# Patient Record
Sex: Female | Born: 1950
Health system: Southern US, Community
[De-identification: ages and names within clinical notes are randomized; demographics above are authoritative.]

## PROBLEM LIST (undated history)

## (undated) DIAGNOSIS — M1990 Unspecified osteoarthritis, unspecified site: Secondary | ICD-10-CM

## (undated) DIAGNOSIS — K829 Disease of gallbladder, unspecified: Secondary | ICD-10-CM

## (undated) DIAGNOSIS — M2550 Pain in unspecified joint: Secondary | ICD-10-CM

## (undated) DIAGNOSIS — D649 Anemia, unspecified: Secondary | ICD-10-CM

## (undated) DIAGNOSIS — I252 Old myocardial infarction: Secondary | ICD-10-CM

## (undated) DIAGNOSIS — T7840XA Allergy, unspecified, initial encounter: Secondary | ICD-10-CM

## (undated) DIAGNOSIS — G4730 Sleep apnea, unspecified: Secondary | ICD-10-CM

## (undated) DIAGNOSIS — K589 Irritable bowel syndrome without diarrhea: Secondary | ICD-10-CM

## (undated) DIAGNOSIS — Z91018 Allergy to other foods: Secondary | ICD-10-CM

## (undated) DIAGNOSIS — J45909 Unspecified asthma, uncomplicated: Secondary | ICD-10-CM

## (undated) DIAGNOSIS — K219 Gastro-esophageal reflux disease without esophagitis: Secondary | ICD-10-CM

## (undated) DIAGNOSIS — K5792 Diverticulitis of intestine, part unspecified, without perforation or abscess without bleeding: Secondary | ICD-10-CM

## (undated) DIAGNOSIS — I2510 Atherosclerotic heart disease of native coronary artery without angina pectoris: Secondary | ICD-10-CM

## (undated) DIAGNOSIS — E739 Lactose intolerance, unspecified: Secondary | ICD-10-CM

## (undated) HISTORY — DX: Allergy to other foods: Z91.018

## (undated) HISTORY — DX: Unspecified asthma, uncomplicated: J45.909

## (undated) HISTORY — PX: CARDIAC CATHETERIZATION: SHX172

## (undated) HISTORY — DX: Disease of gallbladder, unspecified: K82.9

## (undated) HISTORY — DX: Gastro-esophageal reflux disease without esophagitis: K21.9

## (undated) HISTORY — PX: APPENDECTOMY: SHX54

## (undated) HISTORY — DX: Allergy, unspecified, initial encounter: T78.40XA

## (undated) HISTORY — DX: Old myocardial infarction: I25.2

## (undated) HISTORY — PX: COLOSTOMY REVERSAL: SHX5782

## (undated) HISTORY — PX: BREAST CYST EXCISION: SHX579

## (undated) HISTORY — DX: Irritable bowel syndrome, unspecified: K58.9

## (undated) HISTORY — PX: TONSILLECTOMY: SUR1361

## (undated) HISTORY — DX: Diverticulitis of intestine, part unspecified, without perforation or abscess without bleeding: K57.92

## (undated) HISTORY — DX: Pain in unspecified joint: M25.50

## (undated) HISTORY — DX: Lactose intolerance, unspecified: E73.9

## (undated) HISTORY — PX: COLONOSCOPY: SHX174

## (undated) HISTORY — PX: BREAST BIOPSY: SHX20

---

## 2014-01-31 ENCOUNTER — Other Ambulatory Visit (HOSPITAL_COMMUNITY)
Admission: RE | Admit: 2014-01-31 | Discharge: 2014-01-31 | Disposition: A | Discharge status: Home / Self Care | Payer: Federal, State, Local not specified - PPO | Source: Ambulatory Visit | Attending: Family Medicine | Admitting: Family Medicine

## 2014-01-31 DIAGNOSIS — Z01419 Encounter for gynecological examination (general) (routine) without abnormal findings: Secondary | ICD-10-CM | POA: Insufficient documentation

## 2014-01-31 DIAGNOSIS — R87810 Cervical high risk human papillomavirus (HPV) DNA test positive: Secondary | ICD-10-CM | POA: Diagnosis present

## 2014-01-31 DIAGNOSIS — Z1151 Encounter for screening for human papillomavirus (HPV): Secondary | ICD-10-CM | POA: Diagnosis present

## 2014-03-02 ENCOUNTER — Other Ambulatory Visit: Payer: Self-pay

## 2014-03-02 DIAGNOSIS — Z1231 Encounter for screening mammogram for malignant neoplasm of breast: Secondary | ICD-10-CM

## 2014-03-28 ENCOUNTER — Ambulatory Visit: Payer: Federal, State, Local not specified - PPO

## 2014-04-03 ENCOUNTER — Ambulatory Visit
Admission: RE | Admit: 2014-04-03 | Discharge: 2014-04-03 | Disposition: A | Discharge status: Home / Self Care | Payer: Federal, State, Local not specified - PPO | Source: Ambulatory Visit

## 2014-04-03 DIAGNOSIS — Z1231 Encounter for screening mammogram for malignant neoplasm of breast: Secondary | ICD-10-CM

## 2015-03-09 ENCOUNTER — Other Ambulatory Visit: Payer: Self-pay

## 2015-03-09 DIAGNOSIS — Z1231 Encounter for screening mammogram for malignant neoplasm of breast: Secondary | ICD-10-CM

## 2015-04-05 ENCOUNTER — Ambulatory Visit
Admission: RE | Admit: 2015-04-05 | Discharge: 2015-04-05 | Disposition: A | Discharge status: Home / Self Care | Payer: Federal, State, Local not specified - PPO | Source: Ambulatory Visit

## 2015-04-05 DIAGNOSIS — Z1231 Encounter for screening mammogram for malignant neoplasm of breast: Secondary | ICD-10-CM

## 2015-07-05 ENCOUNTER — Encounter: Payer: Self-pay | Admitting: Gastroenterology

## 2015-08-23 ENCOUNTER — Other Ambulatory Visit (HOSPITAL_COMMUNITY)
Admission: RE | Admit: 2015-08-23 | Discharge: 2015-08-23 | Disposition: A | Discharge status: Home / Self Care | Payer: Federal, State, Local not specified - PPO | Source: Ambulatory Visit | Attending: Family Medicine | Admitting: Family Medicine

## 2015-08-23 ENCOUNTER — Other Ambulatory Visit: Payer: Self-pay | Admitting: Family Medicine

## 2015-08-23 DIAGNOSIS — Z01419 Encounter for gynecological examination (general) (routine) without abnormal findings: Secondary | ICD-10-CM | POA: Insufficient documentation

## 2015-08-23 DIAGNOSIS — Z1151 Encounter for screening for human papillomavirus (HPV): Secondary | ICD-10-CM | POA: Insufficient documentation

## 2015-08-29 LAB — CYTOLOGY - PAP

## 2015-09-03 ENCOUNTER — Encounter: Payer: Self-pay | Admitting: Gastroenterology

## 2015-09-03 ENCOUNTER — Ambulatory Visit (INDEPENDENT_AMBULATORY_CARE_PROVIDER_SITE_OTHER): Payer: Federal, State, Local not specified - PPO | Admitting: Gastroenterology

## 2015-09-03 VITALS — BP 140/78 | HR 72 | Ht 62.0 in | Wt 233.0 lb

## 2015-09-03 DIAGNOSIS — R933 Abnormal findings on diagnostic imaging of other parts of digestive tract: Secondary | ICD-10-CM

## 2015-09-03 DIAGNOSIS — K5732 Diverticulitis of large intestine without perforation or abscess without bleeding: Secondary | ICD-10-CM

## 2015-09-03 MED ORDER — NA SULFATE-K SULFATE-MG SULF 17.5-3.13-1.6 GM/177ML PO SOLN: 1.0000 | Freq: Once | ORAL | Status: DC

## 2015-09-03 NOTE — Patient Instructions (Signed)
You have been scheduled for a colonoscopy. Please follow written instructions given to you at your visit today.  Please pick up your prep supplies at the pharmacy within the next 1-3 days. If you use inhalers (even only as needed), please bring them with you on the day of your procedure. Your physician has requested that you go to www.startemmi.com and enter the access code given to you at your visit today. This web site gives a general overview about your procedure. However, you should still follow specific instructions given to you by our office regarding your preparation for the procedure.  Normal BMI (Body Mass Index- based on height and weight) is between 19 and 25. Your BMI today is Body mass index is 42.61 kg/(m^2). Marland Kitchen Please consider follow up  regarding your BMI with your Primary Care Provider.  Thank you for choosing me and [facility].  Pricilla Riffle. Dagoberto Ligas., MD., Marval Regal

## 2015-09-03 NOTE — Progress Notes (Signed)
    History of Present Illness: This is a 65 year old self referred by for the evaluation of recurrent diverticulitis. Patient states she was diagnosed with diverticulitis in 2013 and was treated with a course of antibiotics. In March 2017 she had a recurrence of diverticulitis and was treated at [facility] walk-in clinic with 2 antibiotics she believes were Cipro and Flagyl. In early April she had a more severe recurrence of left lower quadrant pain and was treated at [facility] ED. I have records from this visit. White blood cell count elevated at 11.4, glucose elevated at 118, other blood work unremarkable. CT scan showed diverticulitis involving the left colon, fatty liver and renal cysts. She was treated with a course of Cipro and Flagyl and possibly Xifaxan per the medical record. Patient does not recall. Her left lower quadrant pain resolved and she has felt well since then. She has no other GI complaints. She has partial records from a colonoscopy performed at [facility] in [place] on 06/14/2007 that apparently showed diverticulosis. Denies weight loss, abdominal pain, constipation, diarrhea, change in stool caliber, melena, hematochezia, nausea, vomiting, dysphagia, reflux symptoms, chest pain.   Review of Systems: Pertinent positive and negative review of systems were noted in the above HPI section. All other review of systems were otherwise negative.  Current Medications, Allergies, Past Medical History, Past Surgical History, Family History and Social History were reviewed in [employer] record.  Physical Exam: General: Well developed, well nourished, no acute distress Head: Normocephalic and atraumatic Eyes:  sclerae anicteric, EOMI Ears: Normal auditory acuity Mouth: No deformity or lesions Neck: Supple, no masses or thyromegaly Lungs: Clear throughout to auscultation Heart: Regular rate and rhythm; no murmurs, rubs or  bruits Abdomen: Soft, non tender and non distended. No masses, hepatosplenomegaly or hernias noted. Normal Bowel sounds Rectal: Deferred to colonoscopy Musculoskeletal: Symmetrical with no gross deformities  Skin: No lesions on visible extremities Pulses:  Normal pulses noted Extremities: No clubbing, cyanosis, edema or deformities noted Neurological: Alert oriented x 4, grossly nonfocal Cervical Nodes:  No significant cervical adenopathy Inguinal Nodes: No significant inguinal adenopathy Psychological:  Alert and cooperative. Normal mood and affect  Assessment and Recommendations:  1. Recurrent diverticulitis, abnormal CT scan of the colon. Continue a high fiber diet, daily fiber supplements and adequate daily water intake. Schedule colonoscopy. The risks (including bleeding, perforation, infection, missed lesions, medication reactions and possible hospitalization or surgery if complications occur), benefits, and alternatives to colonoscopy with possible biopsy and possible polypectomy were discussed with the patient and they consent to proceed.

## 2015-09-18 ENCOUNTER — Encounter: Payer: Self-pay | Admitting: Gastroenterology

## 2015-09-21 ENCOUNTER — Encounter: Payer: Self-pay | Admitting: Gastroenterology

## 2015-09-21 ENCOUNTER — Ambulatory Visit (AMBULATORY_SURGERY_CENTER): Payer: Federal, State, Local not specified - PPO | Admitting: Gastroenterology

## 2015-09-21 VITALS — BP 108/68 | HR 62 | Temp 98.9°F | Resp 13 | Ht 62.0 in | Wt 233.0 lb

## 2015-09-21 DIAGNOSIS — K5732 Diverticulitis of large intestine without perforation or abscess without bleeding: Secondary | ICD-10-CM | POA: Diagnosis present

## 2015-09-21 DIAGNOSIS — R933 Abnormal findings on diagnostic imaging of other parts of digestive tract: Secondary | ICD-10-CM | POA: Diagnosis not present

## 2015-09-21 MED ORDER — SODIUM CHLORIDE 0.9 % IV SOLN: 500.0000 mL | INTRAVENOUS | Status: DC

## 2015-09-21 NOTE — Op Note (Signed)
[facility] Patient Name: Valerie Fields Procedure Date: 09/21/2015 3:24 PM MRN: [id number] Endoscopist: Ladene Artist , MD Age: 65 Referring MD:  Date of Birth: 1950-09-23 Gender: Female Account #: [id number] Procedure:                Colonoscopy Indications:              Evaluation on imaging study of clinically                            significant abnormality, Abnormal CT of the GI tract Medicines:                Monitored Anesthesia Care Procedure:                Pre-Anesthesia Assessment:                           - Prior to the procedure, a History and Physical                            was performed, and patient medications and                            allergies were reviewed. The patient's tolerance of                            previous anesthesia was also reviewed. The risks                            and benefits of the procedure and the sedation                            options and risks were discussed with the patient.                            All questions were answered, and informed consent                            was obtained. Prior Anticoagulants: The patient has                            taken no previous anticoagulant or antiplatelet                            agents. ASA Grade Assessment: II - A patient with                            mild systemic disease. After reviewing the risks                            and benefits, the patient was deemed in                            satisfactory condition to undergo the procedure.  After obtaining informed consent, the colonoscope                            was passed under direct vision. Throughout the                            procedure, the patient's blood pressure, pulse, and                            oxygen saturations were monitored continuously. The                            Model PCF-H190L [id number]) scope was introduced                            through the  anus and advanced to the the cecum,                            identified by appendiceal orifice and ileocecal                            valve. The colonoscopy was performed without                            difficulty. The patient tolerated the procedure                            well. The quality of the bowel preparation was                            good. The ileocecal valve, appendiceal orifice, and                            rectum were photographed. Scope In: 3:29:07 PM Scope Out: 3:42:00 PM Scope Withdrawal Time: 0 hours 10 minutes 36 seconds  Total Procedure Duration: 0 hours 12 minutes 53 seconds  Findings:                 The digital rectal exam was normal.                           Multiple medium-mouthed diverticula were found in                            the splenic flexure, transverse colon, hepatic                            flexure, ascending colon and cecum. There was no                            evidence of diverticular bleeding.                           Many medium-mouthed diverticula were found in the  sigmoid colon and descending colon. There was                            narrowing of the colon in association with the                            diverticular opening. There was evidence of                            diverticular spasm. There was no evidence of                            diverticular bleeding.                           The exam was otherwise without abnormality on                            direct and retroflexion views. Complications:            No immediate complications. Estimated Blood Loss:     Estimated blood loss: none. Impression:               - Mild diverticulosis at the splenic flexure, in                            the transverse colon, at the hepatic flexure, in                            the ascending colon and in the cecum.                           - Moderate diverticulosis in the sigmoid colon and                             in the descending colon.                           - The examination was otherwise normal on direct                            and retroflexion views.                           - No specimens collected. Recommendation:           - Patient has a contact number available for                            emergencies. The signs and symptoms of potential                            delayed complications were discussed with the                            patient. Return to normal activities tomorrow.  Written discharge instructions were provided to the                            patient.                           - High fiber diet indefinitely.                           - Continue present medications.                           - Repeat colonoscopy in 10 years for screening                            purposes. Ladene Artist, MD 09/21/2015 3:47:56 PM This report has been signed electronically.

## 2015-09-21 NOTE — Patient Instructions (Signed)
YOU HAD AN ENDOSCOPIC PROCEDURE TODAY AT [facility]:   Refer to the procedure report that was given to you for any specific questions about what was found during the examination.  If the procedure report does not answer your questions, please call your gastroenterologist to clarify.  If you requested that your care partner not be given the details of your procedure findings, then the procedure report has been included in a sealed envelope for you to review at your convenience later.  YOU SHOULD EXPECT: Some feelings of bloating in the abdomen. Passage of more gas than usual.  Walking can help get rid of the air that was put into your GI tract during the procedure and reduce the bloating. If you had a lower endoscopy (such as a colonoscopy or flexible sigmoidoscopy) you may notice spotting of blood in your stool or on the toilet paper. If you underwent a bowel prep for your procedure, you may not have a normal bowel movement for a few days.  Please Note:  You might notice some irritation and congestion in your nose or some drainage.  This is from the oxygen used during your procedure.  There is no need for concern and it should clear up in a day or so.  SYMPTOMS TO REPORT IMMEDIATELY:   Following lower endoscopy (colonoscopy or flexible sigmoidoscopy):  Excessive amounts of blood in the stool  Significant tenderness or worsening of abdominal pains  Swelling of the abdomen that is new, acute  Fever of 100F or higher  For urgent or emergent issues, a gastroenterologist can be reached at any hour by calling [phone].   DIET: Your first meal following the procedure should be a small meal and then it is ok to progress to your normal diet. Heavy or fried foods are harder to digest and may make you feel nauseous or bloated.  Likewise, meals heavy in dairy and vegetables can increase bloating.  Drink plenty of fluids but you should avoid alcoholic beverages for 24 hours. Increase  the fiber in your diet, and drink plenty of water.  ACTIVITY:  You should plan to take it easy for the rest of today and you should NOT DRIVE or use heavy machinery until tomorrow (because of the sedation medicines used during the test).    FOLLOW UP: Our staff will call the number listed on your records the next business day following your procedure to check on you and address any questions or concerns that you may have regarding the information given to you following your procedure. If we do not reach you, we will leave a message.  However, if you are feeling well and you are not experiencing any problems, there is no need to return our call.  We will assume that you have returned to your regular daily activities without incident.  If any biopsies were taken you will be contacted by phone or by letter within the next 1-3 weeks.  Please call us at [phone] if you have not heard about the biopsies in 3 weeks.    SIGNATURES/CONFIDENTIALITY: You and/or your care partner have signed paperwork which will be entered into your electronic medical record.  These signatures attest to the fact that that the information above on your After Visit Summary has been reviewed and is understood.  Full responsibility of the confidentiality of this discharge information lies with you and/or your care-partner.  Read all of the handouts given to you by your recovery room nurse.  Thank-you for choosing [place] for your healthcare needs today.

## 2015-09-21 NOTE — Progress Notes (Signed)
Report to PACU, RN, vss, BBS= Clear.  

## 2015-09-24 ENCOUNTER — Telehealth: Payer: Self-pay

## 2015-09-24 NOTE — Telephone Encounter (Signed)
  Follow up Call-  Call back number 09/21/2015  Post procedure Call Back phone  # #[phone] cell  Permission to leave phone message Yes     Patient questions:  Do you have a fever, pain , or abdominal swelling? No. Pain Score  0 *  Have you tolerated food without any problems? Yes.    Have you been able to return to your normal activities? Yes.    Do you have any questions about your discharge instructions: Diet   No. Medications  No. Follow up visit  No.  Do you have questions or concerns about your Care? No.  Actions: * If pain score is 4 or above: No action needed, pain <4.

## 2016-02-28 ENCOUNTER — Other Ambulatory Visit: Payer: Self-pay | Admitting: Family Medicine

## 2016-02-28 DIAGNOSIS — Z1231 Encounter for screening mammogram for malignant neoplasm of breast: Secondary | ICD-10-CM

## 2016-04-08 ENCOUNTER — Ambulatory Visit
Admission: RE | Admit: 2016-04-08 | Discharge: 2016-04-08 | Disposition: A | Discharge status: Home / Self Care | Payer: Federal, State, Local not specified - PPO | Source: Ambulatory Visit | Attending: Family Medicine | Admitting: Family Medicine

## 2016-04-08 DIAGNOSIS — Z1231 Encounter for screening mammogram for malignant neoplasm of breast: Secondary | ICD-10-CM

## 2016-12-08 DIAGNOSIS — M25552 Pain in left hip: Secondary | ICD-10-CM | POA: Diagnosis not present

## 2016-12-15 DIAGNOSIS — R110 Nausea: Secondary | ICD-10-CM | POA: Diagnosis not present

## 2016-12-15 DIAGNOSIS — R1031 Right lower quadrant pain: Secondary | ICD-10-CM | POA: Diagnosis not present

## 2016-12-15 DIAGNOSIS — E278 Other specified disorders of adrenal gland: Secondary | ICD-10-CM | POA: Diagnosis not present

## 2016-12-15 DIAGNOSIS — R509 Fever, unspecified: Secondary | ICD-10-CM | POA: Diagnosis not present

## 2016-12-15 DIAGNOSIS — Z882 Allergy status to sulfonamides status: Secondary | ICD-10-CM | POA: Diagnosis not present

## 2016-12-15 DIAGNOSIS — K5900 Constipation, unspecified: Secondary | ICD-10-CM | POA: Diagnosis not present

## 2016-12-15 DIAGNOSIS — N281 Cyst of kidney, acquired: Secondary | ICD-10-CM | POA: Diagnosis not present

## 2016-12-15 DIAGNOSIS — R1032 Left lower quadrant pain: Secondary | ICD-10-CM | POA: Diagnosis not present

## 2016-12-15 DIAGNOSIS — K5732 Diverticulitis of large intestine without perforation or abscess without bleeding: Secondary | ICD-10-CM | POA: Diagnosis not present

## 2016-12-17 DIAGNOSIS — K5792 Diverticulitis of intestine, part unspecified, without perforation or abscess without bleeding: Secondary | ICD-10-CM | POA: Diagnosis not present

## 2016-12-22 DIAGNOSIS — M25552 Pain in left hip: Secondary | ICD-10-CM | POA: Diagnosis not present

## 2017-01-07 DIAGNOSIS — H1045 Other chronic allergic conjunctivitis: Secondary | ICD-10-CM | POA: Diagnosis not present

## 2017-01-07 DIAGNOSIS — J3089 Other allergic rhinitis: Secondary | ICD-10-CM | POA: Diagnosis not present

## 2017-01-07 DIAGNOSIS — R05 Cough: Secondary | ICD-10-CM | POA: Diagnosis not present

## 2017-01-07 DIAGNOSIS — J3081 Allergic rhinitis due to animal (cat) (dog) hair and dander: Secondary | ICD-10-CM | POA: Diagnosis not present

## 2017-01-21 DIAGNOSIS — Z23 Encounter for immunization: Secondary | ICD-10-CM | POA: Diagnosis not present

## 2017-01-23 DIAGNOSIS — L600 Ingrowing nail: Secondary | ICD-10-CM | POA: Diagnosis not present

## 2017-02-02 ENCOUNTER — Encounter (INDEPENDENT_AMBULATORY_CARE_PROVIDER_SITE_OTHER): Payer: Self-pay

## 2017-02-02 ENCOUNTER — Ambulatory Visit (INDEPENDENT_AMBULATORY_CARE_PROVIDER_SITE_OTHER): Payer: Medicare Other | Admitting: Physician Assistant

## 2017-02-02 ENCOUNTER — Encounter: Payer: Self-pay | Admitting: Physician Assistant

## 2017-02-02 VITALS — BP 124/72 | HR 76 | Ht 62.0 in | Wt 216.0 lb

## 2017-02-02 DIAGNOSIS — K5732 Diverticulitis of large intestine without perforation or abscess without bleeding: Secondary | ICD-10-CM | POA: Diagnosis not present

## 2017-02-02 NOTE — Patient Instructions (Signed)
We have given you a low fiber, low residue handout.   Please purchase the following medications over the counter and take as directed:  Align probiotic once daily

## 2017-02-02 NOTE — Progress Notes (Signed)
Reviewed and agree with initial management plan.  Auriana Scalia T. Skylen Spiering, MD FACG 

## 2017-02-02 NOTE — Progress Notes (Signed)
Chief Complaint: Diverticulitis  HPI: Valerie Fields is a 66 year old female with a past medical history as listed below who follows with Dr. Fuller Plan and who was referred to me by Marda Stalker, PA-C for follow-up after an episode of diverticulitis.      Patient's last colonoscopy was performed 09/21/15 with findings of mild diverticulosis at the splenic flexure, transverse colon, hepatic flexure and ascending colon as well as cecum, moderate diverticulosis in the sigmoid colon and descending colon and otherwise normal exam.    Patient was seen in the ER 12/15/16 with a complaint of left lower quadrant abdominal pain and a history of diverticulitis x2.  She also had a low-grade fever of 100.6.  CT of the abdomen and pelvis showed sigmoid diverticulitis with surrounding stranding and mild fecal retention throughout the colon. She also had an elevated white count.  She was given Ciprofloxacin and Flagyl.    Today, the patient presents to clinic and tells me that she feels completely better.  She explains that she was slightly disappointed when she had another attack of diverticulitis.  She has had 3 in the past 6 years.  She explains that she has changed her diet to a more healthy one on Weight Watchers and is actually losing weight and has been eating high fiber.  She describes regular bowel movements and no constipation.  She also tries to increase her water intake.  She asked today if there is anything she can do in the future to try and prevent these episodes.  Patient does tell me when she feels some left lower quadrant abdominal pain she will take Ciprofloxacin which she has left over at home and after 3-4 days feels better.    Patient denies fever, chills, blood in her stool, melena, weight loss, fatigue, anorexia, nausea, vomiting, change in bowel habits or current abdominal pain.   Past Medical History:  Diagnosis Date  . Allergy   . Asthma   . Diverticulitis   . GERD (gastroesophageal reflux  disease)     Past Surgical History:  Procedure Laterality Date  . BREAST BIOPSY Left   . COLONOSCOPY    . TONSILLECTOMY      Current Outpatient Medications  Medication Sig Dispense Refill  . azelastine (ASTELIN) 0.1 % nasal spray Place 1 spray 2 (two) times daily into both nostrils. Use in each nostril as directed    . cholecalciferol (VITAMIN D) 1000 units tablet Take 2,000 Units daily by mouth.    . fluticasone (VERAMYST) 27.5 MCG/SPRAY nasal spray Place 2 sprays into the nose daily.    . meloxicam (MOBIC) 15 MG tablet TAKE 1 TABLET BY MOUTH EVERY DAY WITH FOOD AS NEEDED    . montelukast (SINGULAIR) 10 MG tablet Take 10 mg by mouth daily.  3  . Omega-3 Fatty Acids (FISH OIL) 1200 MG CAPS Take 1 capsule by mouth daily.    . Red Yeast Rice Extract 600 MG CAPS Take 2 capsules by mouth daily.     No current facility-administered medications for this visit.     Allergies as of 02/02/2017 - Review Complete 02/02/2017  Allergen Reaction Noted  . Sulfa antibiotics Other (See Comments) 09/03/2015    Family History  Problem Relation Age of Onset  . Irritable bowel syndrome Father   . Aneurysm Mother   . Stomach cancer Maternal Grandfather   . Colon cancer Neg Hx   . Esophageal cancer Neg Hx   . Pancreatic cancer Neg Hx   . Rectal  cancer Neg Hx     Social History   Socioeconomic History  . Marital status: Divorced    Spouse name: Not on file  . Number of children: 2  . Years of education: Not on file  . Highest education level: Not on file  Social Needs  . Financial resource strain: Not on file  . Food insecurity - worry: Not on file  . Food insecurity - inability: Not on file  . Transportation needs - medical: Not on file  . Transportation needs - non-medical: Not on file  Occupational History  . Occupation: Retired  Tobacco Use  . Smoking status: Never Smoker  . Smokeless tobacco: Never Used  Substance and Sexual Activity  . Alcohol use: Yes    Alcohol/week: 0.6  oz    Types: 1 Glasses of wine per week  . Drug use: No  . Sexual activity: Not on file  Other Topics Concern  . Not on file  Social History Narrative  . Not on file    Review of Systems:    Constitutional: No weight loss, fever or chills Skin: No rash  Cardiovascular: No chest pain   Respiratory: No SOB  Gastrointestinal: See HPI and otherwise negative Genitourinary: No dysuria  Neurological: No headache Musculoskeletal: No new muscle or joint pain Hematologic: No bleeding  Psychiatric: No history of depression or anxiety   Physical Exam:  Vital signs: BP 124/72   Pulse 76   Ht 5\' 2"  (1.575 m)   Wt 216 lb (98 kg)   BMI 39.51 kg/m    Constitutional:   Pleasant overweight Caucasian female appears to be in NAD, Well developed, Well nourished, alert and cooperative Respiratory: Respirations even and unlabored. Lungs clear to auscultation bilaterally.   No wheezes, crackles, or rhonchi.  Cardiovascular: Normal S1, S2. No MRG. Regular rate and rhythm. No peripheral edema, cyanosis or pallor.  Gastrointestinal:  Soft, nondistended, nontender. No rebound or guarding. Normal bowel sounds. No appreciable masses or hepatomegaly. Psychiatric:  Demonstrates good judgement and reason without abnormal affect or behaviors.  See HPI for recent imaging.  Assessment: 1. Diverticulitis: dx 12/15/16 with diverticulitis via CT and elevated white count in ER, 7 days of Cipro and Flagyl cleared her symptoms, no further abdominal pain since, last colonoscopy 2017 with moderate diverticulosis in the sigmoid colon  Plan: 1.  Reviewed the pathophysiology of diverticulitis with the patient today using diagrams in the room.  Did discuss that if the patient feels a left lower quadrant abdominal pain starting she can go on a low fiber /low residue diet for 3-4 days (provided her with handout) or until pain resolves and this may prevent an episode.  I prefer she do this rather than use 2-3 days of  Ciprofloxacin as she could build up a bacterial resistance in the future.  Patient verbalized understanding. 2.  Reviewed recommendations for a high fiber diet 25-35 g/day through use of a fiber supplement and/or through her diet on a daily basis when she is not having a flare. 3.  Discussed probiotics with the patient.  Encouraged her to use Align for at least 2 months.  Did provide a coupon. 4.  Patient to follow in clinic as needed in the future.  Ellouise Newer, PA-C [facility] 02/02/2017, 2:34 PM  Cc: Marda Stalker, PA-C

## 2017-03-03 DIAGNOSIS — M7062 Trochanteric bursitis, left hip: Secondary | ICD-10-CM | POA: Diagnosis not present

## 2017-03-29 DIAGNOSIS — J0101 Acute recurrent maxillary sinusitis: Secondary | ICD-10-CM | POA: Diagnosis not present

## 2017-04-06 ENCOUNTER — Other Ambulatory Visit: Payer: Self-pay | Admitting: Family Medicine

## 2017-04-06 DIAGNOSIS — Z1231 Encounter for screening mammogram for malignant neoplasm of breast: Secondary | ICD-10-CM

## 2017-04-21 DIAGNOSIS — M791 Myalgia, unspecified site: Secondary | ICD-10-CM | POA: Diagnosis not present

## 2017-04-21 DIAGNOSIS — J00 Acute nasopharyngitis [common cold]: Secondary | ICD-10-CM | POA: Diagnosis not present

## 2017-04-29 ENCOUNTER — Ambulatory Visit: Payer: Medicare Other

## 2017-04-30 ENCOUNTER — Ambulatory Visit
Admission: RE | Admit: 2017-04-30 | Discharge: 2017-04-30 | Disposition: A | Discharge status: Home / Self Care | Payer: Medicare Other | Source: Ambulatory Visit | Attending: Family Medicine | Admitting: Family Medicine

## 2017-04-30 DIAGNOSIS — Z1231 Encounter for screening mammogram for malignant neoplasm of breast: Secondary | ICD-10-CM

## 2017-05-20 DIAGNOSIS — D2272 Melanocytic nevi of left lower limb, including hip: Secondary | ICD-10-CM | POA: Diagnosis not present

## 2017-05-20 DIAGNOSIS — X32XXXA Exposure to sunlight, initial encounter: Secondary | ICD-10-CM | POA: Diagnosis not present

## 2017-05-20 DIAGNOSIS — R21 Rash and other nonspecific skin eruption: Secondary | ICD-10-CM | POA: Diagnosis not present

## 2017-05-20 DIAGNOSIS — L570 Actinic keratosis: Secondary | ICD-10-CM | POA: Diagnosis not present

## 2017-05-20 DIAGNOSIS — R208 Other disturbances of skin sensation: Secondary | ICD-10-CM | POA: Diagnosis not present

## 2017-05-20 DIAGNOSIS — D485 Neoplasm of uncertain behavior of skin: Secondary | ICD-10-CM | POA: Diagnosis not present

## 2017-05-20 DIAGNOSIS — L821 Other seborrheic keratosis: Secondary | ICD-10-CM | POA: Diagnosis not present

## 2017-05-20 DIAGNOSIS — Z789 Other specified health status: Secondary | ICD-10-CM | POA: Diagnosis not present

## 2017-05-20 DIAGNOSIS — D2261 Melanocytic nevi of right upper limb, including shoulder: Secondary | ICD-10-CM | POA: Diagnosis not present

## 2017-05-20 DIAGNOSIS — D2262 Melanocytic nevi of left upper limb, including shoulder: Secondary | ICD-10-CM | POA: Diagnosis not present

## 2017-05-20 DIAGNOSIS — L820 Inflamed seborrheic keratosis: Secondary | ICD-10-CM | POA: Diagnosis not present

## 2017-05-20 DIAGNOSIS — L538 Other specified erythematous conditions: Secondary | ICD-10-CM | POA: Diagnosis not present

## 2017-05-20 DIAGNOSIS — D2271 Melanocytic nevi of right lower limb, including hip: Secondary | ICD-10-CM | POA: Diagnosis not present

## 2017-06-25 DIAGNOSIS — R030 Elevated blood-pressure reading, without diagnosis of hypertension: Secondary | ICD-10-CM | POA: Diagnosis not present

## 2017-06-25 DIAGNOSIS — K5792 Diverticulitis of intestine, part unspecified, without perforation or abscess without bleeding: Secondary | ICD-10-CM | POA: Diagnosis not present

## 2017-07-03 DIAGNOSIS — K5792 Diverticulitis of intestine, part unspecified, without perforation or abscess without bleeding: Secondary | ICD-10-CM | POA: Diagnosis not present

## 2017-07-14 ENCOUNTER — Telehealth: Payer: Self-pay | Admitting: Gastroenterology

## 2017-07-14 NOTE — Telephone Encounter (Signed)
Patient was treated by her primary care for diverticulitis.  She was treated with cipro and flagyl and the pain resolved.  She was feeling better until yesterday.  Yesterday she had some abdominal cramping and some slight nausea.  She wants to know what to do.  She denies pain, vomiting, diarrhea, constipation or fever.  She is advised to try a bland diet for the next 24-48 hours can try IBgard and if her symptoms fail to improve she should call back.

## 2017-07-14 NOTE — Telephone Encounter (Signed)
Agree with recommendations.  

## 2017-09-03 DIAGNOSIS — Z0000 Encounter for general adult medical examination without abnormal findings: Secondary | ICD-10-CM | POA: Diagnosis not present

## 2017-09-03 DIAGNOSIS — R739 Hyperglycemia, unspecified: Secondary | ICD-10-CM | POA: Diagnosis not present

## 2017-09-03 DIAGNOSIS — E7800 Pure hypercholesterolemia, unspecified: Secondary | ICD-10-CM | POA: Diagnosis not present

## 2017-09-09 DIAGNOSIS — R739 Hyperglycemia, unspecified: Secondary | ICD-10-CM | POA: Diagnosis not present

## 2017-09-09 DIAGNOSIS — Z136 Encounter for screening for cardiovascular disorders: Secondary | ICD-10-CM | POA: Diagnosis not present

## 2017-09-09 DIAGNOSIS — E7800 Pure hypercholesterolemia, unspecified: Secondary | ICD-10-CM | POA: Diagnosis not present

## 2017-09-09 DIAGNOSIS — Z0000 Encounter for general adult medical examination without abnormal findings: Secondary | ICD-10-CM | POA: Diagnosis not present

## 2017-10-20 DIAGNOSIS — M84675A Pathological fracture in other disease, left foot, initial encounter for fracture: Secondary | ICD-10-CM | POA: Diagnosis not present

## 2017-10-20 DIAGNOSIS — M722 Plantar fascial fibromatosis: Secondary | ICD-10-CM | POA: Diagnosis not present

## 2017-11-10 DIAGNOSIS — M84374D Stress fracture, right foot, subsequent encounter for fracture with routine healing: Secondary | ICD-10-CM | POA: Diagnosis not present

## 2017-11-17 DIAGNOSIS — E2839 Other primary ovarian failure: Secondary | ICD-10-CM | POA: Diagnosis not present

## 2017-11-17 DIAGNOSIS — M8588 Other specified disorders of bone density and structure, other site: Secondary | ICD-10-CM | POA: Diagnosis not present

## 2017-12-08 DIAGNOSIS — R319 Hematuria, unspecified: Secondary | ICD-10-CM | POA: Diagnosis not present

## 2017-12-13 DIAGNOSIS — N390 Urinary tract infection, site not specified: Secondary | ICD-10-CM | POA: Diagnosis not present

## 2017-12-13 DIAGNOSIS — R319 Hematuria, unspecified: Secondary | ICD-10-CM | POA: Diagnosis not present

## 2017-12-29 DIAGNOSIS — M1711 Unilateral primary osteoarthritis, right knee: Secondary | ICD-10-CM | POA: Diagnosis not present

## 2018-01-22 DIAGNOSIS — J329 Chronic sinusitis, unspecified: Secondary | ICD-10-CM | POA: Diagnosis not present

## 2018-01-31 DIAGNOSIS — Z23 Encounter for immunization: Secondary | ICD-10-CM | POA: Diagnosis not present

## 2018-02-08 DIAGNOSIS — J3081 Allergic rhinitis due to animal (cat) (dog) hair and dander: Secondary | ICD-10-CM | POA: Diagnosis not present

## 2018-02-08 DIAGNOSIS — R05 Cough: Secondary | ICD-10-CM | POA: Diagnosis not present

## 2018-02-08 DIAGNOSIS — H1045 Other chronic allergic conjunctivitis: Secondary | ICD-10-CM | POA: Diagnosis not present

## 2018-02-08 DIAGNOSIS — J3089 Other allergic rhinitis: Secondary | ICD-10-CM | POA: Diagnosis not present

## 2018-02-15 DIAGNOSIS — H2513 Age-related nuclear cataract, bilateral: Secondary | ICD-10-CM | POA: Diagnosis not present

## 2018-04-02 ENCOUNTER — Other Ambulatory Visit: Payer: Self-pay | Admitting: Family Medicine

## 2018-04-02 DIAGNOSIS — Z1231 Encounter for screening mammogram for malignant neoplasm of breast: Secondary | ICD-10-CM

## 2018-04-09 ENCOUNTER — Encounter: Payer: Self-pay | Admitting: Physician Assistant

## 2018-04-09 ENCOUNTER — Ambulatory Visit (INDEPENDENT_AMBULATORY_CARE_PROVIDER_SITE_OTHER): Payer: Medicare HMO | Admitting: Physician Assistant

## 2018-04-09 ENCOUNTER — Encounter (INDEPENDENT_AMBULATORY_CARE_PROVIDER_SITE_OTHER): Payer: Self-pay

## 2018-04-09 VITALS — BP 128/70 | HR 83 | Temp 98.8°F | Ht 62.0 in | Wt 202.0 lb

## 2018-04-09 DIAGNOSIS — Z8719 Personal history of other diseases of the digestive system: Secondary | ICD-10-CM

## 2018-04-09 DIAGNOSIS — R1030 Lower abdominal pain, unspecified: Secondary | ICD-10-CM | POA: Diagnosis not present

## 2018-04-09 MED ORDER — DICYCLOMINE HCL 10 MG PO CAPS: ORAL_CAPSULE | ORAL | 2 refills | Status: DC

## 2018-04-09 MED ORDER — CIPROFLOXACIN HCL 500 MG PO TABS: 500.0000 mg | ORAL_TABLET | Freq: Two times a day (BID) | ORAL | 0 refills | Status: DC

## 2018-04-09 MED ORDER — METRONIDAZOLE 500 MG PO TABS: 500.0000 mg | ORAL_TABLET | Freq: Two times a day (BID) | ORAL | 0 refills | Status: AC

## 2018-04-09 NOTE — Progress Notes (Signed)
Chief Complaint: Lower abdominal pain  HPI:    Valerie Fields is a 68 year old Caucasian female with a past medical history as listed below, known to Dr. Fuller Plan, who presents to clinic today with a complaint of diverticulitis.    02/02/2017 office visit with me, at that time had recently been to the ER with a CT showing sigmoid diverticulitis.  She had been placed on Cipro and Flagyl and felt completely better at her follow-up.  At that time discussed diverticulitis, last colonoscopy noted 2017 with moderate diverticulosis in the sigmoid colon.  At that time discussed diet recommendations to try and prevent further episodes.    Today, the patient presents clinic and explains that she has been doing fairly well maintaining herself in a high-fiber diet with a fiber supplement as well as a stool softener daily which keeps her regular.  Over Christmas she went to [place] and tells me that she dank more alcohol than she normally would and ate some different things and a couple days into that trip started with lower abdominal cramping and felt like every time she ate she needed to have a bowel movement about 30 to 40 minutes later which would be solid.  She started drinking more water and watching her diet and things seemed to get better and were completely gone by 04/01/2018, but over the past couple of days she has started again with a lower abdominal pain which occurs all day long rated as a 2-3/10 and gets worse after eating about 10-15 minutes and will result in a bowel movement.  Also describes staying in bed yesterday and feeling "chilled" with a low-grade temp at 100.5.    Denies weight loss, blood in her stool or symptoms that awaken her from sleep.  Past Medical History:  Diagnosis Date  . Allergy   . Asthma   . Diverticulitis   . GERD (gastroesophageal reflux disease)     Past Surgical History:  Procedure Laterality Date  . BREAST BIOPSY Left    No Scar seen   . BREAST CYST EXCISION Left    No scar seen   . COLONOSCOPY    . TONSILLECTOMY      Current Outpatient Medications  Medication Sig Dispense Refill  . azelastine (ASTELIN) 0.1 % nasal spray Place 1 spray 2 (two) times daily into both nostrils. Use in each nostril as directed    . cholecalciferol (VITAMIN D) 1000 units tablet Take 2,000 Units daily by mouth.    . fluticasone (VERAMYST) 27.5 MCG/SPRAY nasal spray Place 2 sprays into the nose daily.    . meloxicam (MOBIC) 15 MG tablet TAKE 1 TABLET BY MOUTH EVERY DAY WITH FOOD AS NEEDED    . montelukast (SINGULAIR) 10 MG tablet Take 10 mg by mouth daily.  3  . Omega-3 Fatty Acids (FISH OIL) 1200 MG CAPS Take 1 capsule by mouth daily.    . Red Yeast Rice Extract 600 MG CAPS Take 2 capsules by mouth daily.     No current facility-administered medications for this visit.     Allergies as of 04/09/2018 - Review Complete 04/09/2018  Allergen Reaction Noted  . Sulfa antibiotics Other (See Comments) 09/03/2015    Family History  Problem Relation Age of Onset  . Irritable bowel syndrome Father   . Aneurysm Mother   . Stomach cancer Maternal Grandfather   . Colon cancer Neg Hx   . Esophageal cancer Neg Hx   . Pancreatic cancer Neg Hx   . Rectal  cancer Neg Hx   . Breast cancer Neg Hx     Social History   Socioeconomic History  . Marital status: Divorced    Spouse name: Not on file  . Number of children: 2  . Years of education: Not on file  . Highest education level: Not on file  Occupational History  . Occupation: Retired  Scientific laboratory technician  . Financial resource strain: Not on file  . Food insecurity:    Worry: Not on file    Inability: Not on file  . Transportation needs:    Medical: Not on file    Non-medical: Not on file  Tobacco Use  . Smoking status: Never Smoker  . Smokeless tobacco: Never Used  Substance and Sexual Activity  . Alcohol use: Yes    Alcohol/week: 1.0 standard drinks    Types: 1 Glasses of wine per week  . Drug use: No  . Sexual  activity: Not on file  Lifestyle  . Physical activity:    Days per week: Not on file    Minutes per session: Not on file  . Stress: Not on file  Relationships  . Social connections:    Talks on phone: Not on file    Gets together: Not on file    Attends religious service: Not on file    Active member of club or organization: Not on file    Attends meetings of clubs or organizations: Not on file    Relationship status: Not on file  . Intimate partner violence:    Fear of current or ex partner: Not on file    Emotionally abused: Not on file    Physically abused: Not on file    Forced sexual activity: Not on file  Other Topics Concern  . Not on file  Social History Narrative  . Not on file    Review of Systems:    Constitutional: No weight loss Cardiovascular: No chest pain Respiratory: No SOB Gastrointestinal: See HPI and otherwise negative   Physical Exam:  Vital signs: BP 128/70   Pulse 83   Temp 98.8 F (37.1 C)   Ht 5\' 2"  (1.575 m)   Wt 202 lb (91.6 kg)   BMI 36.95 kg/m   Constitutional:   Pleasant Caucasian female appears to be in NAD, Well developed, Well nourished, alert and cooperative Respiratory: Respirations even and unlabored. Lungs clear to auscultation bilaterally.   No wheezes, crackles, or rhonchi.  Cardiovascular: Normal S1, S2. No MRG. Regular rate and rhythm. No peripheral edema, cyanosis or pallor.  Gastrointestinal:  Soft, nondistended, moderate bilateral lower abdominal TTP, worse in the left lower quadrant with some voluntary guarding Normal bowel sounds. No appreciable masses or hepatomegaly. Rectal:  Not performed.  Psychiatric: Demonstrates good judgement and reason without abnormal affect or behaviors.  No recent labs.  Assessment: 1.  Lower abdominal pain: Lower abdominal pain with a change in bowel habits and low-grade fever, worse in left lower quadrant today; concern for repeat diverticulitis 2.  History of diverticulitis: Last in  September 2018  Plan: 1.  We will go ahead and empirically start Cipro 500 mg twice daily and Flagyl 500 mg 3 times daily x7 days. 2.  Encouraged patient to maintain a low fiber/ low residue diet for the length of treatment and afterwards if her pain lingers (handout given), then gradually increase back to her regular regimen of a high-fiber diet and stool softeners.  Encouraged her to stay hydrated. 3.  Prescribed Dicyclomine 10  mg every 6 hours as needed for abdominal pain after finishing antibiotics # 15 with 2 refills 4.  Patient to follow in clinic as needed with Dr. Fuller Plan or myself.  Ellouise Newer, PA-C [facility] 04/09/2018, 9:46 AM  Cc: Marda Stalker, PA-C

## 2018-04-09 NOTE — Patient Instructions (Signed)
We sent prescriptions to [facility] [address], , [place].  1. Bentyl ( Dicyclomine) 10 mg 2. Cipro 500 mg 3. Flagyl ( Metronidazole) 500 mg  We have given you a Low fiber handout.  Normal BMI (Body Mass Index- based on height and weight) is between 23 and 30. Your BMI today is Body mass index is 36.95 kg/m. Marland Kitchen Please consider follow up  regarding your BMI with your Primary Care Provider.

## 2018-04-09 NOTE — Progress Notes (Signed)
Reviewed and agree with initial management plan.  Olyvia Gopal T. Oluwadarasimi Favor, MD FACG 

## 2018-04-14 DIAGNOSIS — N390 Urinary tract infection, site not specified: Secondary | ICD-10-CM | POA: Diagnosis not present

## 2018-04-14 DIAGNOSIS — E7800 Pure hypercholesterolemia, unspecified: Secondary | ICD-10-CM | POA: Diagnosis not present

## 2018-05-04 DIAGNOSIS — H10413 Chronic giant papillary conjunctivitis, bilateral: Secondary | ICD-10-CM | POA: Diagnosis not present

## 2018-05-04 DIAGNOSIS — H1820 Unspecified corneal edema: Secondary | ICD-10-CM | POA: Diagnosis not present

## 2018-05-05 ENCOUNTER — Ambulatory Visit
Admission: RE | Admit: 2018-05-05 | Discharge: 2018-05-05 | Disposition: A | Discharge status: Home / Self Care | Payer: Medicare HMO | Source: Ambulatory Visit | Attending: Family Medicine | Admitting: Family Medicine

## 2018-05-05 DIAGNOSIS — Z1231 Encounter for screening mammogram for malignant neoplasm of breast: Secondary | ICD-10-CM | POA: Diagnosis not present

## 2018-05-12 DIAGNOSIS — L821 Other seborrheic keratosis: Secondary | ICD-10-CM | POA: Diagnosis not present

## 2018-05-12 DIAGNOSIS — X32XXXA Exposure to sunlight, initial encounter: Secondary | ICD-10-CM | POA: Diagnosis not present

## 2018-05-12 DIAGNOSIS — D225 Melanocytic nevi of trunk: Secondary | ICD-10-CM | POA: Diagnosis not present

## 2018-05-12 DIAGNOSIS — E65 Localized adiposity: Secondary | ICD-10-CM | POA: Diagnosis not present

## 2018-05-12 DIAGNOSIS — L578 Other skin changes due to chronic exposure to nonionizing radiation: Secondary | ICD-10-CM | POA: Diagnosis not present

## 2018-05-12 DIAGNOSIS — L570 Actinic keratosis: Secondary | ICD-10-CM | POA: Diagnosis not present

## 2018-05-12 DIAGNOSIS — L814 Other melanin hyperpigmentation: Secondary | ICD-10-CM | POA: Diagnosis not present

## 2018-05-12 DIAGNOSIS — D2261 Melanocytic nevi of right upper limb, including shoulder: Secondary | ICD-10-CM | POA: Diagnosis not present

## 2018-05-12 DIAGNOSIS — L908 Other atrophic disorders of skin: Secondary | ICD-10-CM | POA: Diagnosis not present

## 2018-05-12 DIAGNOSIS — L308 Other specified dermatitis: Secondary | ICD-10-CM | POA: Diagnosis not present

## 2018-05-18 DIAGNOSIS — H1820 Unspecified corneal edema: Secondary | ICD-10-CM | POA: Diagnosis not present

## 2018-09-07 DIAGNOSIS — M1711 Unilateral primary osteoarthritis, right knee: Secondary | ICD-10-CM | POA: Diagnosis not present

## 2018-09-08 DIAGNOSIS — Z1159 Encounter for screening for other viral diseases: Secondary | ICD-10-CM | POA: Diagnosis not present

## 2018-09-08 DIAGNOSIS — E7800 Pure hypercholesterolemia, unspecified: Secondary | ICD-10-CM | POA: Diagnosis not present

## 2018-09-08 DIAGNOSIS — Z0000 Encounter for general adult medical examination without abnormal findings: Secondary | ICD-10-CM | POA: Diagnosis not present

## 2018-12-27 DIAGNOSIS — H1820 Unspecified corneal edema: Secondary | ICD-10-CM | POA: Diagnosis not present

## 2018-12-27 DIAGNOSIS — H52203 Unspecified astigmatism, bilateral: Secondary | ICD-10-CM | POA: Diagnosis not present

## 2019-01-20 DIAGNOSIS — M1711 Unilateral primary osteoarthritis, right knee: Secondary | ICD-10-CM | POA: Diagnosis not present

## 2019-01-21 DIAGNOSIS — R69 Illness, unspecified: Secondary | ICD-10-CM | POA: Diagnosis not present

## 2019-02-03 ENCOUNTER — Emergency Department (HOSPITAL_BASED_OUTPATIENT_CLINIC_OR_DEPARTMENT_OTHER)
Admission: EM | Admit: 2019-02-03 | Discharge: 2019-02-03 | Disposition: A | Discharge status: Home / Self Care | Payer: Medicare HMO | Attending: Emergency Medicine | Admitting: Emergency Medicine

## 2019-02-03 ENCOUNTER — Other Ambulatory Visit: Payer: Self-pay | Admitting: Physician Assistant

## 2019-02-03 ENCOUNTER — Other Ambulatory Visit: Payer: Medicare HMO

## 2019-02-03 ENCOUNTER — Other Ambulatory Visit: Payer: Self-pay

## 2019-02-03 ENCOUNTER — Emergency Department (HOSPITAL_BASED_OUTPATIENT_CLINIC_OR_DEPARTMENT_OTHER): Payer: Medicare HMO

## 2019-02-03 ENCOUNTER — Encounter (HOSPITAL_BASED_OUTPATIENT_CLINIC_OR_DEPARTMENT_OTHER): Payer: Self-pay | Admitting: Emergency Medicine

## 2019-02-03 DIAGNOSIS — R1011 Right upper quadrant pain: Secondary | ICD-10-CM | POA: Diagnosis not present

## 2019-02-03 DIAGNOSIS — M549 Dorsalgia, unspecified: Secondary | ICD-10-CM | POA: Diagnosis not present

## 2019-02-03 DIAGNOSIS — Z79899 Other long term (current) drug therapy: Secondary | ICD-10-CM | POA: Diagnosis not present

## 2019-02-03 DIAGNOSIS — K760 Fatty (change of) liver, not elsewhere classified: Secondary | ICD-10-CM | POA: Diagnosis not present

## 2019-02-03 DIAGNOSIS — J45909 Unspecified asthma, uncomplicated: Secondary | ICD-10-CM | POA: Insufficient documentation

## 2019-02-03 DIAGNOSIS — R109 Unspecified abdominal pain: Secondary | ICD-10-CM | POA: Diagnosis present

## 2019-02-03 LAB — CBC WITH DIFFERENTIAL/PLATELET
Abs Immature Granulocytes: 0.02 10*3/uL (ref 0.00–0.07)
Basophils Absolute: 0 10*3/uL (ref 0.0–0.1)
Basophils Relative: 0 %
Eosinophils Absolute: 0.2 10*3/uL (ref 0.0–0.5)
Eosinophils Relative: 2 %
HCT: 48.1 % — ABNORMAL HIGH (ref 36.0–46.0)
Hemoglobin: 15.6 g/dL — ABNORMAL HIGH (ref 12.0–15.0)
Immature Granulocytes: 0 %
Lymphocytes Relative: 33 %
Lymphs Abs: 3.2 10*3/uL (ref 0.7–4.0)
MCH: 31.6 pg (ref 26.0–34.0)
MCHC: 32.4 g/dL (ref 30.0–36.0)
MCV: 97.4 fL (ref 80.0–100.0)
Monocytes Absolute: 0.7 10*3/uL (ref 0.1–1.0)
Monocytes Relative: 7 %
Neutro Abs: 5.5 10*3/uL (ref 1.7–7.7)
Neutrophils Relative %: 58 %
Platelets: 316 10*3/uL (ref 150–400)
RBC: 4.94 MIL/uL (ref 3.87–5.11)
RDW: 12.8 % (ref 11.5–15.5)
WBC: 9.6 10*3/uL (ref 4.0–10.5)
nRBC: 0 % (ref 0.0–0.2)

## 2019-02-03 LAB — URINALYSIS, ROUTINE W REFLEX MICROSCOPIC
Bilirubin Urine: NEGATIVE
Glucose, UA: NEGATIVE mg/dL
Ketones, ur: NEGATIVE mg/dL
Nitrite: NEGATIVE
Protein, ur: NEGATIVE mg/dL
Specific Gravity, Urine: 1.015 (ref 1.005–1.030)
pH: 5.5 (ref 5.0–8.0)

## 2019-02-03 LAB — COMPREHENSIVE METABOLIC PANEL
ALT: 27 U/L (ref 0–44)
AST: 24 U/L (ref 15–41)
Albumin: 3.8 g/dL (ref 3.5–5.0)
Alkaline Phosphatase: 62 U/L (ref 38–126)
Anion gap: 10 (ref 5–15)
BUN: 12 mg/dL (ref 8–23)
CO2: 24 mmol/L (ref 22–32)
Calcium: 9.5 mg/dL (ref 8.9–10.3)
Chloride: 103 mmol/L (ref 98–111)
Creatinine, Ser: 0.94 mg/dL (ref 0.44–1.00)
GFR calc Af Amer: >60 mL/min (ref >60)
GFR calc non Af Amer: >60 mL/min (ref >60)
Glucose, Bld: 89 mg/dL (ref 70–99)
Potassium: 3.8 mmol/L (ref 3.5–5.1)
Sodium: 137 mmol/L (ref 135–145)
Total Bilirubin: 0.6 mg/dL (ref 0.3–1.2)
Total Protein: 6.8 g/dL (ref 6.5–8.1)

## 2019-02-03 LAB — LIPASE, BLOOD: Lipase: 32 U/L (ref 11–51)

## 2019-02-03 LAB — URINALYSIS, MICROSCOPIC (REFLEX)

## 2019-02-03 NOTE — ED Provider Notes (Addendum)
[facility] EMERGENCY DEPARTMENT Provider Note   CSN: [id number] Arrival date & time: 02/03/19  1200     History   Chief Complaint Chief Complaint  Patient presents with  . Abdominal Pain    HPI Valerie Fields is a 68 y.o. female.     68 year old female with past medical history below including diverticulosis and asthma who presents with abdominal pain.  Yesterday morning she was awakened from sleep with right upper abdominal pain that occasionally radiates to her back and up to her right scapula.  The pain was fairly persistent yesterday, slightly better today but still present.  She ate a Anberlyn Feimster bit yesterday and a Orel Hord bit this morning.  No vomiting, diarrhea, bloody stools, urinary symptoms, fevers, or recent illness.  She reports distant history of gallbladder problems while she was pregnant and then again many years ago but never required any intervention.  The history is provided by the patient.  Abdominal Pain   Past Medical History:  Diagnosis Date  . Allergy   . Asthma   . Diverticulitis   . GERD (gastroesophageal reflux disease)     There are no active problems to display for this patient.   Past Surgical History:  Procedure Laterality Date  . BREAST BIOPSY Left    No Scar seen   . BREAST CYST EXCISION Left    No scar seen   . COLONOSCOPY    . TONSILLECTOMY       OB History   No obstetric history on file.      Home Medications    Prior to Admission medications   Medication Sig Start Date End Date Taking? Authorizing Provider  azelastine (ASTELIN) 0.1 % nasal spray Place 1 spray 2 (two) times daily into both nostrils. Use in each nostril as directed    [provider]  cetirizine (ZYRTEC) 10 MG tablet Take 10 mg by mouth daily.    [provider]  cholecalciferol (VITAMIN D) 1000 units tablet Take 2,000 Units daily by mouth.    [provider]  ciprofloxacin (CIPRO) 500 MG tablet Take 1 tablet (500 mg  total) by mouth 2 (two) times daily. 04/09/18   Levin Erp, PA  dicyclomine (BENTYL) 10 MG capsule Take 1 tablet by mouth every 6 hours as needed. 04/09/18   Levin Erp, PA  docusate sodium (STOOL SOFTENER) 100 MG capsule Take 200 mg by mouth daily.    [provider]  ezetimibe (ZETIA) 10 MG tablet Take 10 mg by mouth daily.    [provider]  fluticasone (VERAMYST) 27.5 MCG/SPRAY nasal spray Place 2 sprays into the nose daily.    [provider]  meloxicam (MOBIC) 15 MG tablet TAKE 1 TABLET BY MOUTH EVERY DAY WITH FOOD AS NEEDED 01/14/16   [provider]  montelukast (SINGULAIR) 10 MG tablet Take 10 mg by mouth daily. 08/20/15   [provider]  Omega-3 Fatty Acids (FISH OIL) 1200 MG CAPS Take 1 capsule by mouth daily.    [provider]  psyllium (REGULOID) 0.52 g capsule Take 1.5 g by mouth daily.    [provider]    Family History Family History  Problem Relation Age of Onset  . GER disease Father   . Aneurysm Mother   . Stomach cancer Maternal Grandfather   . Colon cancer Neg Hx   . Esophageal cancer Neg Hx   . Pancreatic cancer Neg Hx   . Rectal cancer Neg Hx   .  Breast cancer Neg Hx     Social History Social History   Tobacco Use  . Smoking status: Never Smoker  . Smokeless tobacco: Never Used  Substance Use Topics  . Alcohol use: Yes    Alcohol/week: 1.0 standard drinks    Types: 1 Glasses of wine per week  . Drug use: No     Allergies   Sulfa antibiotics   Review of Systems Review of Systems  Gastrointestinal: Positive for abdominal pain.   All other systems reviewed and are negative except that which was mentioned in HPI   Physical Exam Updated Vital Signs BP 139/83 (BP Location: Right Arm)   Pulse 73   Temp 98.7 F (37.1 C) (Oral)   Resp 16   Ht 5\' 2"  (1.575 m)   Wt 93.4 kg   SpO2 100%   BMI 37.68 kg/m   Physical Exam Vitals signs and nursing note  reviewed.  Constitutional:      General: She is not in acute distress.    Appearance: She is well-developed.  HENT:     Head: Normocephalic and atraumatic.  Eyes:     Conjunctiva/sclera: Conjunctivae normal.  Neck:     Musculoskeletal: Neck supple.  Cardiovascular:     Rate and Rhythm: Normal rate and regular rhythm.     Heart sounds: Normal heart sounds. No murmur.  Pulmonary:     Effort: Pulmonary effort is normal.     Breath sounds: Normal breath sounds.  Abdominal:     General: Bowel sounds are normal. There is no distension.     Palpations: Abdomen is soft.     Tenderness: There is abdominal tenderness in the right upper quadrant. There is no guarding or rebound. Positive signs include Murphy's sign.  Skin:    General: Skin is warm and dry.  Neurological:     Mental Status: She is alert and oriented to person, place, and time.     Comments: Fluent speech  Psychiatric:        Judgment: Judgment normal.      ED Treatments / Results  Labs (all labs ordered are listed, but only abnormal results are displayed) Labs Reviewed  URINALYSIS, ROUTINE W REFLEX MICROSCOPIC - Abnormal; Notable for the following components:      Result Value   Hgb urine dipstick SMALL (*)    Leukocytes,Ua MODERATE (*)    All other components within normal limits  CBC WITH DIFFERENTIAL/PLATELET - Abnormal; Notable for the following components:   Hemoglobin 15.6 (*)    HCT 48.1 (*)    All other components within normal limits  URINALYSIS, MICROSCOPIC (REFLEX) - Abnormal; Notable for the following components:   Bacteria, UA FEW (*)    All other components within normal limits  COMPREHENSIVE METABOLIC PANEL  LIPASE, BLOOD    EKG None  Radiology No results found.  Procedures Procedures (including critical care time)  Medications Ordered in ED Medications - No data to display   Initial Impression / Assessment and Plan / ED Course  I have reviewed the triage vital signs and the nursing  notes.  Pertinent labs & imaging results that were available during my care of the patient were reviewed by me and considered in my medical decision making (see chart for details).        Well-appearing on exam, normal vital signs, focal right upper quadrant tenderness noted.  No lower abdominal pain.  Lab work shows normal CMP and CBC, normal lipase.  UA with moderate leukocytes,  some WBCs and bacteria.  Patient has denied any urinary symptoms.  Will add urine culture.  I have ordered a right upper quadrant ultrasound to evaluate for gallbladder pathology.    Right upper quadrant ultrasound negative for any acute process.  Patient continues to have pain especially in her right flank/back.  She has a small amount of blood in her urine, differential includes kidney stone.  I have ordered CT no study.  Signing out to oncoming provider pending CT results.  Final Clinical Impressions(s) / ED Diagnoses   Final diagnoses:  Abdominal pain, RUQ    ED Discharge Orders    None       Harce Volden, Wenda Overland, MD 02/03/19 [place], Wenda Overland, MD 02/03/19 1529

## 2019-02-03 NOTE — Discharge Instructions (Addendum)
You were evaluated in the Emergency Department and after careful evaluation, we did not find any emergent condition requiring admission or further testing in the hospital.  Your exam/testing today was overall reassuring.  Your ultrasound and your CT scan did not show any emergencies.  No stones in the gallbladder and no stones in the kidneys.  As discussed, please follow-up with your primary care doctor for further management of your discomfort.  Please return to the Emergency Department if you experience any worsening of your condition.  We encourage you to follow up with a primary care provider.  Thank you for allowing [place] to be a part of your care.

## 2019-02-03 NOTE — ED Provider Notes (Signed)
  Provider Note MRN:  [id number]  Arrival date & time: 02/03/19    ED Course and Medical Decision Making  Assumed care from Dr. Rex Kras at shift change.  Right upper quadrant pain, hematuria, awaiting CT stone study, anticipating discharge.  4:15 PM update: CT is without abnormalities, no stone.  Appropriate for discharge.  Final Clinical Impressions(s) / ED Diagnoses     ICD-10-CM   1. Right upper quadrant abdominal pain  R10.11   2. Abdominal pain, RUQ  R10.11 US Abdomen Limited RUQ    US Abdomen Limited RUQ    ED Discharge Orders    None        Discharge Instructions     You were evaluated in the Emergency Department and after careful evaluation, we did not find any emergent condition requiring admission or further testing in the hospital.  Your exam/testing today was overall reassuring.  Your ultrasound and your CT scan did not show any emergencies.  No stones in the gallbladder and no stones in the kidneys.  As discussed, please follow-up with your primary care doctor for further management of your discomfort.  Please return to the Emergency Department if you experience any worsening of your condition.  We encourage you to follow up with a primary care provider.  Thank you for allowing [place] to be a part of your care.      Barth Kirks. Sedonia Small, [place] [email or web address]    Maudie Flakes, MD 02/03/19 [phone]

## 2019-02-03 NOTE — ED Triage Notes (Signed)
Pt c/o RUQ pain since yesterday morning, describes as burning

## 2019-02-05 LAB — URINE CULTURE: Culture: <10000 — AB

## 2019-02-07 DIAGNOSIS — J3089 Other allergic rhinitis: Secondary | ICD-10-CM | POA: Diagnosis not present

## 2019-02-07 DIAGNOSIS — J3081 Allergic rhinitis due to animal (cat) (dog) hair and dander: Secondary | ICD-10-CM | POA: Diagnosis not present

## 2019-02-07 DIAGNOSIS — R05 Cough: Secondary | ICD-10-CM | POA: Diagnosis not present

## 2019-02-07 DIAGNOSIS — H1045 Other chronic allergic conjunctivitis: Secondary | ICD-10-CM | POA: Diagnosis not present

## 2019-03-03 DIAGNOSIS — M9902 Segmental and somatic dysfunction of thoracic region: Secondary | ICD-10-CM | POA: Diagnosis not present

## 2019-03-03 DIAGNOSIS — M9903 Segmental and somatic dysfunction of lumbar region: Secondary | ICD-10-CM | POA: Diagnosis not present

## 2019-03-03 DIAGNOSIS — M9905 Segmental and somatic dysfunction of pelvic region: Secondary | ICD-10-CM | POA: Diagnosis not present

## 2019-03-03 DIAGNOSIS — M62830 Muscle spasm of back: Secondary | ICD-10-CM | POA: Diagnosis not present

## 2019-03-15 DIAGNOSIS — M62830 Muscle spasm of back: Secondary | ICD-10-CM | POA: Diagnosis not present

## 2019-03-15 DIAGNOSIS — M9905 Segmental and somatic dysfunction of pelvic region: Secondary | ICD-10-CM | POA: Diagnosis not present

## 2019-03-15 DIAGNOSIS — M9902 Segmental and somatic dysfunction of thoracic region: Secondary | ICD-10-CM | POA: Diagnosis not present

## 2019-03-15 DIAGNOSIS — M9903 Segmental and somatic dysfunction of lumbar region: Secondary | ICD-10-CM | POA: Diagnosis not present

## 2019-05-02 ENCOUNTER — Other Ambulatory Visit: Payer: Self-pay | Admitting: Family Medicine

## 2019-05-02 DIAGNOSIS — Z1231 Encounter for screening mammogram for malignant neoplasm of breast: Secondary | ICD-10-CM

## 2019-06-01 ENCOUNTER — Ambulatory Visit: Payer: Medicare HMO

## 2019-06-28 ENCOUNTER — Ambulatory Visit
Admission: RE | Admit: 2019-06-28 | Discharge: 2019-06-28 | Disposition: A | Discharge status: Home / Self Care | Payer: Medicare HMO | Source: Ambulatory Visit | Attending: Family Medicine | Admitting: Family Medicine

## 2019-06-28 ENCOUNTER — Other Ambulatory Visit: Payer: Self-pay

## 2019-06-28 DIAGNOSIS — Z1231 Encounter for screening mammogram for malignant neoplasm of breast: Secondary | ICD-10-CM

## 2019-10-13 ENCOUNTER — Other Ambulatory Visit: Payer: Self-pay

## 2019-10-13 ENCOUNTER — Encounter (HOSPITAL_COMMUNITY): Payer: Self-pay | Admitting: Internal Medicine

## 2019-10-13 ENCOUNTER — Encounter (HOSPITAL_COMMUNITY)
Admission: EM | Disposition: A | Discharge status: Home / Self Care | Payer: Self-pay | Source: Home / Self Care | Attending: Internal Medicine

## 2019-10-13 ENCOUNTER — Emergency Department (HOSPITAL_COMMUNITY): Payer: Medicare HMO

## 2019-10-13 ENCOUNTER — Inpatient Hospital Stay (HOSPITAL_COMMUNITY)
Admission: EM | Admit: 2019-10-13 | Discharge: 2019-10-15 | DRG: 247 | Disposition: A | Discharge status: Home / Self Care | Payer: Medicare HMO | Attending: Cardiology | Admitting: Cardiology

## 2019-10-13 ENCOUNTER — Inpatient Hospital Stay (HOSPITAL_COMMUNITY): Payer: Medicare HMO

## 2019-10-13 DIAGNOSIS — I2510 Atherosclerotic heart disease of native coronary artery without angina pectoris: Secondary | ICD-10-CM | POA: Diagnosis present

## 2019-10-13 DIAGNOSIS — Z20822 Contact with and (suspected) exposure to covid-19: Secondary | ICD-10-CM | POA: Diagnosis present

## 2019-10-13 DIAGNOSIS — K219 Gastro-esophageal reflux disease without esophagitis: Secondary | ICD-10-CM

## 2019-10-13 DIAGNOSIS — I255 Ischemic cardiomyopathy: Secondary | ICD-10-CM

## 2019-10-13 DIAGNOSIS — I5032 Chronic diastolic (congestive) heart failure: Secondary | ICD-10-CM

## 2019-10-13 DIAGNOSIS — Z91012 Allergy to eggs: Secondary | ICD-10-CM

## 2019-10-13 DIAGNOSIS — Z955 Presence of coronary angioplasty implant and graft: Secondary | ICD-10-CM

## 2019-10-13 DIAGNOSIS — Z800 Family history of malignant neoplasm of digestive organs: Secondary | ICD-10-CM

## 2019-10-13 DIAGNOSIS — Z882 Allergy status to sulfonamides status: Secondary | ICD-10-CM | POA: Diagnosis not present

## 2019-10-13 DIAGNOSIS — Z791 Long term (current) use of non-steroidal anti-inflammatories (NSAID): Secondary | ICD-10-CM

## 2019-10-13 DIAGNOSIS — Z79899 Other long term (current) drug therapy: Secondary | ICD-10-CM | POA: Diagnosis not present

## 2019-10-13 DIAGNOSIS — I214 Non-ST elevation (NSTEMI) myocardial infarction: Secondary | ICD-10-CM

## 2019-10-13 DIAGNOSIS — I5042 Chronic combined systolic (congestive) and diastolic (congestive) heart failure: Secondary | ICD-10-CM | POA: Diagnosis present

## 2019-10-13 DIAGNOSIS — E785 Hyperlipidemia, unspecified: Secondary | ICD-10-CM | POA: Diagnosis present

## 2019-10-13 DIAGNOSIS — J45909 Unspecified asthma, uncomplicated: Secondary | ICD-10-CM | POA: Diagnosis present

## 2019-10-13 DIAGNOSIS — Z888 Allergy status to other drugs, medicaments and biological substances status: Secondary | ICD-10-CM | POA: Diagnosis not present

## 2019-10-13 HISTORY — DX: Non-ST elevation (NSTEMI) myocardial infarction: I21.4

## 2019-10-13 HISTORY — PX: LEFT HEART CATH AND CORONARY ANGIOGRAPHY: CATH118249

## 2019-10-13 HISTORY — PX: CORONARY STENT INTERVENTION: CATH118234

## 2019-10-13 HISTORY — DX: Atherosclerotic heart disease of native coronary artery without angina pectoris: I25.10

## 2019-10-13 LAB — POCT ACTIVATED CLOTTING TIME
Activated Clotting Time: 313 seconds
Activated Clotting Time: 241 seconds

## 2019-10-13 LAB — COMPREHENSIVE METABOLIC PANEL
ALT: 29 U/L (ref 0–44)
AST: 25 U/L (ref 15–41)
Albumin: 3.4 g/dL — ABNORMAL LOW (ref 3.5–5.0)
Alkaline Phosphatase: 57 U/L (ref 38–126)
Anion gap: 12 (ref 5–15)
BUN: 12 mg/dL (ref 8–23)
CO2: 21 mmol/L — ABNORMAL LOW (ref 22–32)
Calcium: 8.9 mg/dL (ref 8.9–10.3)
Chloride: 107 mmol/L (ref 98–111)
Creatinine, Ser: 0.79 mg/dL (ref 0.44–1.00)
GFR calc Af Amer: >60 mL/min (ref >60)
GFR calc non Af Amer: >60 mL/min (ref >60)
Glucose, Bld: 165 mg/dL — ABNORMAL HIGH (ref 70–99)
Potassium: 3.6 mmol/L (ref 3.5–5.1)
Sodium: 140 mmol/L (ref 135–145)
Total Bilirubin: 0.8 mg/dL (ref 0.3–1.2)
Total Protein: 6.4 g/dL — ABNORMAL LOW (ref 6.5–8.1)

## 2019-10-13 LAB — TROPONIN I (HIGH SENSITIVITY)
Troponin I (High Sensitivity): 92 ng/L — ABNORMAL HIGH (ref <18)
Troponin I (High Sensitivity): 216 ng/L (ref <18)
Troponin I (High Sensitivity): >27000 ng/L (ref <18)
Troponin I (High Sensitivity): >27000 ng/L (ref <18)

## 2019-10-13 LAB — LIPID PANEL
Cholesterol: 176 mg/dL (ref 0–200)
HDL: 48 mg/dL (ref >40)
LDL Cholesterol: 96 mg/dL (ref 0–99)
Total CHOL/HDL Ratio: 3.7 RATIO
Triglycerides: 161 mg/dL — ABNORMAL HIGH (ref <150)
VLDL: 32 mg/dL (ref 0–40)

## 2019-10-13 LAB — ECHOCARDIOGRAM COMPLETE
Area-P 1/2: 4.6 cm2
Calc EF: 43.5 %
Height: 62 in
Single Plane A2C EF: 44.1 %
Single Plane A4C EF: 44.2 %
Weight: 3312 oz

## 2019-10-13 LAB — CBC
HCT: 44.6 % (ref 36.0–46.0)
Hemoglobin: 14.7 g/dL (ref 12.0–15.0)
MCH: 31.5 pg (ref 26.0–34.0)
MCHC: 33 g/dL (ref 30.0–36.0)
MCV: 95.5 fL (ref 80.0–100.0)
Platelets: 301 10*3/uL (ref 150–400)
RBC: 4.67 MIL/uL (ref 3.87–5.11)
RDW: 12.7 % (ref 11.5–15.5)
WBC: 9.7 10*3/uL (ref 4.0–10.5)
nRBC: 0 % (ref 0.0–0.2)

## 2019-10-13 LAB — HEMOGLOBIN A1C
Hgb A1c MFr Bld: 5.6 % (ref 4.8–5.6)
Mean Plasma Glucose: 114.02 mg/dL

## 2019-10-13 LAB — SARS CORONAVIRUS 2 BY RT PCR (HOSPITAL ORDER, PERFORMED IN ~~LOC~~ HOSPITAL LAB): SARS Coronavirus 2: NEGATIVE

## 2019-10-13 LAB — LIPASE, BLOOD: Lipase: 30 U/L (ref 11–51)

## 2019-10-13 SURGERY — LEFT HEART CATH AND CORONARY ANGIOGRAPHY
Anesthesia: LOCAL

## 2019-10-13 MED ORDER — FENTANYL CITRATE (PF) 100 MCG/2ML IJ SOLN
INTRAMUSCULAR | Status: AC
  Filled 2019-10-13: qty 2

## 2019-10-13 MED ORDER — ASPIRIN 81 MG PO CHEW
81.0000 mg | CHEWABLE_TABLET | Freq: Every day | ORAL | Status: DC
  Administered 2019-10-14 – 2019-10-15 (×2): 81 mg via ORAL
  Filled 2019-10-13 (×2): qty 1

## 2019-10-13 MED ORDER — POTASSIUM CHLORIDE CRYS ER 20 MEQ PO TBCR
40.0000 meq | EXTENDED_RELEASE_TABLET | Freq: Once | ORAL | Status: AC
  Administered 2019-10-13: 40 meq via ORAL
  Filled 2019-10-13: qty 2

## 2019-10-13 MED ORDER — LABETALOL HCL 5 MG/ML IV SOLN: 10.0000 mg | INTRAVENOUS | Status: AC | PRN

## 2019-10-13 MED ORDER — MIDAZOLAM HCL 2 MG/2ML IJ SOLN
INTRAMUSCULAR | Status: DC | PRN
  Administered 2019-10-13: 1 mg via INTRAVENOUS

## 2019-10-13 MED ORDER — HEPARIN (PORCINE) IN NACL 1000-0.9 UT/500ML-% IV SOLN
INTRAVENOUS | Status: AC
  Filled 2019-10-13: qty 1000

## 2019-10-13 MED ORDER — TIROFIBAN HCL IN NACL 5-0.9 MG/100ML-% IV SOLN
INTRAVENOUS | Status: AC | PRN
  Administered 2019-10-13: 0.15 ug/kg/min via INTRAVENOUS
  Administered 2019-10-13: 0.15 ug/kg/min

## 2019-10-13 MED ORDER — FUROSEMIDE 10 MG/ML IJ SOLN
20.0000 mg | Freq: Once | INTRAMUSCULAR | Status: AC
  Administered 2019-10-13: 20 mg via INTRAVENOUS
  Filled 2019-10-13: qty 2

## 2019-10-13 MED ORDER — ASPIRIN 81 MG PO CHEW: 81.0000 mg | CHEWABLE_TABLET | ORAL | Status: DC

## 2019-10-13 MED ORDER — LORATADINE 10 MG PO TABS
10.0000 mg | ORAL_TABLET | Freq: Every day | ORAL | Status: DC
  Administered 2019-10-14 – 2019-10-15 (×2): 10 mg via ORAL
  Filled 2019-10-13 (×2): qty 1

## 2019-10-13 MED ORDER — FUROSEMIDE 10 MG/ML IJ SOLN
INTRAMUSCULAR | Status: AC
  Filled 2019-10-13: qty 4

## 2019-10-13 MED ORDER — MONTELUKAST SODIUM 10 MG PO TABS
10.0000 mg | ORAL_TABLET | Freq: Every day | ORAL | Status: DC
  Administered 2019-10-13 – 2019-10-14 (×2): 10 mg via ORAL
  Filled 2019-10-13 (×2): qty 1

## 2019-10-13 MED ORDER — NITROGLYCERIN 0.4 MG SL SUBL
SUBLINGUAL_TABLET | SUBLINGUAL | Status: AC
  Filled 2019-10-13: qty 1

## 2019-10-13 MED ORDER — METOPROLOL SUCCINATE 12.5 MG HALF TABLET
12.5000 mg | ORAL_TABLET | Freq: Every day | ORAL | Status: DC
  Administered 2019-10-13: 12.5 mg via ORAL
  Filled 2019-10-13 (×2): qty 1

## 2019-10-13 MED ORDER — ZOLPIDEM TARTRATE 5 MG PO TABS
5.0000 mg | ORAL_TABLET | Freq: Every evening | ORAL | Status: DC | PRN
  Administered 2019-10-14 (×2): 5 mg via ORAL
  Filled 2019-10-13 (×2): qty 1

## 2019-10-13 MED ORDER — DOCUSATE SODIUM 100 MG PO CAPS
100.0000 mg | ORAL_CAPSULE | Freq: Every day | ORAL | Status: DC
  Administered 2019-10-14 – 2019-10-15 (×2): 100 mg via ORAL
  Filled 2019-10-13 (×2): qty 1

## 2019-10-13 MED ORDER — SODIUM CHLORIDE 0.9 % WEIGHT BASED INFUSION: 3.0000 mL/kg/h | INTRAVENOUS | Status: DC

## 2019-10-13 MED ORDER — HEPARIN BOLUS VIA INFUSION
3500.0000 [IU] | Freq: Once | INTRAVENOUS | Status: AC
  Administered 2019-10-13: 3500 [IU] via INTRAVENOUS
  Filled 2019-10-13: qty 3500

## 2019-10-13 MED ORDER — VERAPAMIL HCL 2.5 MG/ML IV SOLN
INTRAVENOUS | Status: AC
  Filled 2019-10-13: qty 2

## 2019-10-13 MED ORDER — METOCLOPRAMIDE HCL 10 MG PO TABS
10.0000 mg | ORAL_TABLET | Freq: Four times a day (QID) | ORAL | Status: DC
  Filled 2019-10-13 (×4): qty 1

## 2019-10-13 MED ORDER — TICAGRELOR 90 MG PO TABS
ORAL_TABLET | ORAL | Status: DC | PRN
  Administered 2019-10-13: 180 mg via ORAL

## 2019-10-13 MED ORDER — PSYLLIUM 0.52 G PO CAPS: 1.5000 g | ORAL_CAPSULE | Freq: Every day | ORAL | Status: DC

## 2019-10-13 MED ORDER — SODIUM CHLORIDE 0.9 % IV SOLN: 250.0000 mL | INTRAVENOUS | Status: DC | PRN

## 2019-10-13 MED ORDER — NITROGLYCERIN IN D5W 200-5 MCG/ML-% IV SOLN
0.0000 ug/min | INTRAVENOUS | Status: DC
  Administered 2019-10-13: 5 ug/min via INTRAVENOUS
  Filled 2019-10-13: qty 250

## 2019-10-13 MED ORDER — AZELASTINE HCL 0.1 % NA SOLN
1.0000 | Freq: Two times a day (BID) | NASAL | Status: DC
  Filled 2019-10-13: qty 30

## 2019-10-13 MED ORDER — HEPARIN (PORCINE) IN NACL 1000-0.9 UT/500ML-% IV SOLN
INTRAVENOUS | Status: DC | PRN
  Administered 2019-10-13 (×2): 500 mL

## 2019-10-13 MED ORDER — FUROSEMIDE 10 MG/ML IJ SOLN
20.0000 mg | Freq: Once | INTRAMUSCULAR | Status: AC
  Administered 2019-10-13: 20 mg via INTRAVENOUS

## 2019-10-13 MED ORDER — HYDRALAZINE HCL 20 MG/ML IJ SOLN: 10.0000 mg | INTRAMUSCULAR | Status: AC | PRN

## 2019-10-13 MED ORDER — TICAGRELOR 90 MG PO TABS
ORAL_TABLET | ORAL | Status: AC
  Filled 2019-10-13: qty 2

## 2019-10-13 MED ORDER — HEPARIN SODIUM (PORCINE) 1000 UNIT/ML IJ SOLN
INTRAMUSCULAR | Status: DC | PRN
  Administered 2019-10-13: 4000 [IU] via INTRAVENOUS
  Administered 2019-10-13: 5000 [IU] via INTRAVENOUS

## 2019-10-13 MED ORDER — NITROGLYCERIN IN D5W 200-5 MCG/ML-% IV SOLN: 0.0000 ug/min | INTRAVENOUS | Status: DC

## 2019-10-13 MED ORDER — HEPARIN (PORCINE) 25000 UT/250ML-% IV SOLN
850.0000 [IU]/h | INTRAVENOUS | Status: DC
  Administered 2019-10-13: 850 [IU]/h via INTRAVENOUS
  Filled 2019-10-13: qty 250

## 2019-10-13 MED ORDER — LIDOCAINE HCL (PF) 1 % IJ SOLN
INTRAMUSCULAR | Status: DC | PRN
  Administered 2019-10-13: 2 mL

## 2019-10-13 MED ORDER — ACETAMINOPHEN 325 MG PO TABS: 650.0000 mg | ORAL_TABLET | ORAL | Status: DC | PRN

## 2019-10-13 MED ORDER — ONDANSETRON HCL 4 MG/2ML IJ SOLN: 4.0000 mg | Freq: Four times a day (QID) | INTRAMUSCULAR | Status: DC | PRN

## 2019-10-13 MED ORDER — TIROFIBAN HCL IN NACL 5-0.9 MG/100ML-% IV SOLN
INTRAVENOUS | Status: AC
  Filled 2019-10-13: qty 100

## 2019-10-13 MED ORDER — DICYCLOMINE HCL 10 MG PO CAPS: 10.0000 mg | ORAL_CAPSULE | Freq: Four times a day (QID) | ORAL | Status: DC | PRN

## 2019-10-13 MED ORDER — SODIUM CHLORIDE 0.9 % WEIGHT BASED INFUSION: 1.0000 mL/kg/h | INTRAVENOUS | Status: DC

## 2019-10-13 MED ORDER — ATORVASTATIN CALCIUM 80 MG PO TABS
80.0000 mg | ORAL_TABLET | Freq: Every day | ORAL | Status: DC
  Administered 2019-10-13 – 2019-10-15 (×3): 80 mg via ORAL
  Filled 2019-10-13 (×3): qty 1

## 2019-10-13 MED ORDER — ENOXAPARIN SODIUM 40 MG/0.4ML ~~LOC~~ SOLN
40.0000 mg | SUBCUTANEOUS | Status: DC
  Administered 2019-10-14 – 2019-10-15 (×2): 40 mg via SUBCUTANEOUS
  Filled 2019-10-13 (×2): qty 0.4

## 2019-10-13 MED ORDER — FENTANYL CITRATE (PF) 100 MCG/2ML IJ SOLN
50.0000 ug | Freq: Once | INTRAMUSCULAR | Status: AC
  Administered 2019-10-13: 50 ug via INTRAVENOUS
  Filled 2019-10-13: qty 2

## 2019-10-13 MED ORDER — HEPARIN SODIUM (PORCINE) 1000 UNIT/ML IJ SOLN
INTRAMUSCULAR | Status: AC
  Filled 2019-10-13: qty 1

## 2019-10-13 MED ORDER — TIROFIBAN HCL IN NACL 5-0.9 MG/100ML-% IV SOLN: 0.1500 ug/kg/min | INTRAVENOUS | Status: AC

## 2019-10-13 MED ORDER — FENTANYL CITRATE (PF) 100 MCG/2ML IJ SOLN
INTRAMUSCULAR | Status: DC | PRN
  Administered 2019-10-13 (×2): 25 ug via INTRAVENOUS

## 2019-10-13 MED ORDER — NITROGLYCERIN 1 MG/10 ML FOR IR/CATH LAB
INTRA_ARTERIAL | Status: AC
  Filled 2019-10-13: qty 10

## 2019-10-13 MED ORDER — PERFLUTREN LIPID MICROSPHERE
1.0000 mL | INTRAVENOUS | Status: AC | PRN
  Administered 2019-10-13: 2 mL via INTRAVENOUS
  Filled 2019-10-13: qty 10

## 2019-10-13 MED ORDER — NITROGLYCERIN 1 MG/10 ML FOR IR/CATH LAB
INTRA_ARTERIAL | Status: DC | PRN
  Administered 2019-10-13 (×3): 100 ug via INTRACORONARY

## 2019-10-13 MED ORDER — MIDAZOLAM HCL 2 MG/2ML IJ SOLN
INTRAMUSCULAR | Status: AC
  Filled 2019-10-13: qty 2

## 2019-10-13 MED ORDER — IOHEXOL 350 MG/ML SOLN
INTRAVENOUS | Status: AC
  Filled 2019-10-13: qty 1

## 2019-10-13 MED ORDER — NITROGLYCERIN 0.4 MG SL SUBL
0.4000 mg | SUBLINGUAL_TABLET | SUBLINGUAL | Status: DC | PRN
  Administered 2019-10-13: 0.4 mg via SUBLINGUAL

## 2019-10-13 MED ORDER — VERAPAMIL HCL 2.5 MG/ML IV SOLN: INTRAVENOUS | Status: DC | PRN

## 2019-10-13 MED ORDER — TICAGRELOR 90 MG PO TABS
90.0000 mg | ORAL_TABLET | Freq: Two times a day (BID) | ORAL | Status: DC
  Administered 2019-10-13 – 2019-10-15 (×4): 90 mg via ORAL
  Filled 2019-10-13 (×4): qty 1

## 2019-10-13 MED ORDER — SODIUM CHLORIDE 0.9% FLUSH
3.0000 mL | Freq: Two times a day (BID) | INTRAVENOUS | Status: DC
  Administered 2019-10-13 – 2019-10-14 (×3): 3 mL via INTRAVENOUS

## 2019-10-13 MED ORDER — SODIUM CHLORIDE 0.9% FLUSH: 3.0000 mL | INTRAVENOUS | Status: DC | PRN

## 2019-10-13 MED ORDER — LIDOCAINE HCL (PF) 1 % IJ SOLN
INTRAMUSCULAR | Status: AC
  Filled 2019-10-13: qty 30

## 2019-10-13 MED ORDER — TIROFIBAN (AGGRASTAT) BOLUS VIA INFUSION
INTRAVENOUS | Status: DC | PRN
  Administered 2019-10-13: 2347.5 ug via INTRAVENOUS

## 2019-10-13 SURGICAL SUPPLY — 21 items
BALLN EMERGE MR 2.25X12 (BALLOONS) ×2
BALLN SAPPHIRE ~~LOC~~ 2.0X12 (BALLOONS) ×2 IMPLANT
BALLN SAPPHIRE ~~LOC~~ 2.75X18 (BALLOONS) ×2 IMPLANT
BALLN SAPPHIRE ~~LOC~~ 3.0X18 (BALLOONS) ×2 IMPLANT
BALLN ~~LOC~~ EMERGE MR 2.0X12 (BALLOONS) ×2
BALLOON EMERGE MR 2.25X12 (BALLOONS) ×1 IMPLANT
BALLOON ~~LOC~~ EMERGE MR 2.0X12 (BALLOONS) ×1 IMPLANT
CATH 5FR JL3.5 JR4 ANG PIG MP (CATHETERS) ×2 IMPLANT
CATH VISTA GUIDE 6FR XBLAD3.0 (CATHETERS) ×2 IMPLANT
DEVICE RAD COMP TR BAND LRG (VASCULAR PRODUCTS) ×2 IMPLANT
GLIDESHEATH SLEND SS 6F .021 (SHEATH) ×2 IMPLANT
GUIDEWIRE INQWIRE 1.5J.035X260 (WIRE) ×1 IMPLANT
INQWIRE 1.5J .035X260CM (WIRE) ×2
KIT ENCORE 26 ADVANTAGE (KITS) ×4 IMPLANT
KIT HEART LEFT (KITS) ×2 IMPLANT
PACK CARDIAC CATHETERIZATION (CUSTOM PROCEDURE TRAY) ×2 IMPLANT
STENT RESOLUTE ONYX 2.75X22 (Permanent Stent) ×2 IMPLANT
TRANSDUCER W/STOPCOCK (MISCELLANEOUS) ×2 IMPLANT
TUBING CIL FLEX 10 FLL-RA (TUBING) ×2 IMPLANT
WIRE HI TORQ BMW 190CM (WIRE) ×2 IMPLANT
WIRE RUNTHROUGH .014X180CM (WIRE) ×2 IMPLANT

## 2019-10-13 NOTE — Progress Notes (Signed)
  Echocardiogram 2D Echocardiogram has been performed.  Michiel Cowboy 10/13/2019, 2:38 PM

## 2019-10-13 NOTE — H&P (View-Only) (Signed)
Cardiology Consultation:   Patient ID: Ayaka Andes MRN: [id number]; DOB: 09-07-1950  Admit date: 10/13/2019 Date of Consult: 10/13/2019  Primary Care Provider: Marda Stalker, [facility] Cardiologist: none [facility] Electrophysiologist:  none  Patient Profile:   Kleo Dungee is a 69 y.o. female with a hx of asthma and GERD who is being seen today for the evaluation of chest pain at the request of Dr. Dayna Barker.  History of Present Illness:   Ms. Kamaka has had stuttering chest pain for the past two days. This morning, chest pain awakened her from sleep and has not abated. She had associated L arm discomfort and back pain. She does not have dyspnea, orthopnea, lightheadedness. She has never had chest pain like this before.    Past Medical History:  Diagnosis Date  . Allergy   . Asthma   . Diverticulitis   . GERD (gastroesophageal reflux disease)     Past Surgical History:  Procedure Laterality Date  . BREAST BIOPSY Left    No Scar seen   . BREAST CYST EXCISION Left    No scar seen   . COLONOSCOPY    . TONSILLECTOMY       Home Medications:  Prior to Admission medications   Medication Sig Start Date End Date Taking? Authorizing Provider  azelastine (ASTELIN) 0.1 % nasal spray Place 1 spray 2 (two) times daily into both nostrils. Use in each nostril as directed   Yes [provider]  cetirizine (ZYRTEC) 10 MG tablet Take 10 mg by mouth daily.   Yes [provider]  cholecalciferol (VITAMIN D) 1000 units tablet Take 2,000 Units daily by mouth.   Yes [provider]  Coenzyme Q10 (CO Q 10 PO) Take 1 capsule by mouth daily.   Yes [provider]  dicyclomine (BENTYL) 10 MG capsule Take 1 tablet by mouth every 6 hours as needed. Patient taking differently: Take 10 mg by mouth every 6 (six) hours as needed for spasms.  04/09/18  Yes Levin Erp, PA  docusate sodium (STOOL SOFTENER) 100 MG capsule Take  100 mg by mouth daily.    Yes [provider]  ezetimibe (ZETIA) 10 MG tablet Take 10 mg by mouth daily.   Yes [provider]  ibuprofen (ADVIL) 200 MG tablet Take 200-800 mg by mouth every 6 (six) hours as needed for moderate pain.   Yes [provider]  meloxicam (MOBIC) 15 MG tablet Take 15 mg by mouth daily.  01/14/16  Yes [provider]  metoCLOPramide (REGLAN) 10 MG tablet Take 10 mg by mouth 4 (four) times daily. 10/11/19  Yes [provider]  montelukast (SINGULAIR) 10 MG tablet Take 10 mg by mouth at bedtime.  08/20/15  Yes [provider]  Multiple Minerals (CALCIUM-MAGNESIUM-ZINC) TABS Take 1-2 tablets by mouth daily.   Yes [provider]  psyllium (REGULOID) 0.52 g capsule Take 1.5 g by mouth daily.   Yes [provider]  ciprofloxacin (CIPRO) 500 MG tablet Take 1 tablet (500 mg total) by mouth 2 (two) times daily. Patient not taking: Reported on 10/13/2019 04/09/18   Levin Erp, [place]    Inpatient Medications: Scheduled Meds:  Continuous Infusions: . heparin 850 Units/hr (10/13/19 0559)  . nitroGLYCERIN 10 mcg/min (10/13/19 0454)   PRN Meds:   Allergies:    Allergies  Allergen Reactions  . Soy Allergy Hives  . Sulfa Antibiotics Other (See Comments)    As a child, turned red above  neck  . Eggs Or Egg-Derived Products Other (See Comments)    Childhood allergy    Social History:   Social History   Socioeconomic History  . Marital status: Divorced    Spouse name: Not on file  . Number of children: 2  . Years of education: Not on file  . Highest education level: Not on file  Occupational History  . Occupation: Retired  Tobacco Use  . Smoking status: Never Smoker  . Smokeless tobacco: Never Used  Vaping Use  . Vaping Use: Never used  Substance and Sexual Activity  . Alcohol use: Yes    Alcohol/week: 1.0 standard drink    Types: 1 Glasses of wine per week  . Drug use: No  .  Sexual activity: Never  Other Topics Concern  . Not on file  Social History Narrative  . Not on file   Social Determinants of Health   Financial Resource Strain:   . Difficulty of Paying Living Expenses:   Food Insecurity:   . Worried About Charity fundraiser in the Last Year:   . Arboriculturist in the Last Year:   Transportation Needs:   . Film/video editor (Medical):   Marland Kitchen Lack of Transportation (Non-Medical):   Physical Activity:   . Days of Exercise per Week:   . Minutes of Exercise per Session:   Stress:   . Feeling of Stress :   Social Connections:   . Frequency of Communication with Friends and Family:   . Frequency of Social Gatherings with Friends and Family:   . Attends Religious Services:   . Active Member of Clubs or Organizations:   . Attends Archivist Meetings:   Marland Kitchen Marital Status:   Intimate Partner Violence:   . Fear of Current or Ex-Partner:   . Emotionally Abused:   Marland Kitchen Physically Abused:   . Sexually Abused:     Family History:    Family History  Problem Relation Age of Onset  . GER disease Father   . Aneurysm Mother   . Stomach cancer Maternal Grandfather   . Colon cancer Neg Hx   . Esophageal cancer Neg Hx   . Pancreatic cancer Neg Hx   . Rectal cancer Neg Hx   . Breast cancer Neg Hx      ROS:  Please see the history of present illness.  All other ROS reviewed and negative.     Physical Exam/Data:   Vitals:   10/13/19 0443 10/13/19 0536 10/13/19 0539 10/13/19 0542  BP:  115/74 113/71 122/76  Pulse:  81 81 86  Resp:  11 12 16   Temp:      TempSrc:      SpO2:  91% 93% 91%  Weight: 93.9 kg     Height: 5\' 2"  (1.575 m)      No intake or output data in the 24 hours ending 10/13/19 0614 Last 3 Weights 10/13/2019 02/03/2019 04/09/2018  Weight (lbs) 207 lb 206 lb 202 lb  Weight (kg) 93.895 kg 93.441 kg 91.627 kg     Body mass index is 37.86 kg/m.  General:  Well nourished, well developed, appearing mildly  uncomfortable HEENT: normal Neck: no JVD Vascular: No carotid bruits; FA pulses 2+ bilaterally without bruits  Cardiac:  normal S1, S2; RRR; no murmur  Lungs:  clear to auscultation bilaterally, no wheezing, rhonchi or rales  Abd: soft, nontender, no hepatomegaly  Ext: no edema Musculoskeletal:  No deformities, BUE and BLE strength  normal and equal Skin: warm and dry  Neuro:  no focal abnormalities noted Psych:  Normal affect   EKG:  Serial EKG were personally reviewed and demonstrates:  Sinus rhythm with ST depressions and <62mm upsloping ST elevations in lead 1 and aVL. ST changes are dynamic.   Relevant CV Studies: none  Laboratory Data:  High Sensitivity Troponin:   Recent Labs  Lab 10/13/19 0422  TROPONINIHS 92*     Chemistry Recent Labs  Lab 10/13/19 0427  NA 140  K 3.6  CL 107  CO2 21*  GLUCOSE 165*  BUN 12  CREATININE 0.79  CALCIUM 8.9  GFRNONAA >60  GFRAA >60  ANIONGAP 12    Recent Labs  Lab 10/13/19 0427  PROT 6.4*  ALBUMIN 3.4*  AST 25  ALT 29  ALKPHOS 57  BILITOT 0.8   Hematology Recent Labs  Lab 10/13/19 0422  WBC 9.7  RBC 4.67  HGB 14.7  HCT 44.6  MCV 95.5  MCH 31.5  MCHC 33.0  RDW 12.7  PLT 301   BNPNo results for input(s): BNP, PROBNP in the last 168 hours.  DDimer No results for input(s): DDIMER in the last 168 hours.   Radiology/Studies:  DG Chest Portable 1 View  Result Date: 10/13/2019 CLINICAL DATA:  Chest pain EXAM: PORTABLE CHEST 1 VIEW COMPARISON:  None. FINDINGS: Artifact from EKG leads. Interstitial prominence with possibility of a few Kerley lines. Normal heart size for technique. Normal hilar and aortic contours. No visible effusion or pneumothorax. IMPRESSION: Borderline interstitial edema Electronically Signed   By: Monte Fantasia M.D.   On: 10/13/2019 05:07    TIMI Risk Score for Unstable Angina or Non-ST Elevation MI:   The patient's TIMI risk score is 5, which indicates a 26% risk of all cause mortality, new  or recurrent myocardial infarction or need for urgent revascularization in the next 14 days.      Assessment and Plan:   1. NSTEMI She is presenting with dynamic ST segment changes and chest pain that is persistent despite maximally tolerated nitroglycerin drip (hypotension). Therefore, I recommend urgent coronary angiography. The one piece of her case that doesn't fit is only mildly elevated troponin after 2 days of chest pain, suggesting that either (1) this is not a plaque rupture event or (2) that she had unstable angina for two days with type 1 event that woke her from sleep this morning. I discussed with her and her friend recommendation for heart catheterization and she is in agreement.  - ASA 324 given in ED - heparin drip - nitro drip targeting chest pain free  - serial troponin  - admit to cardiology after LHC   For questions or updates, please contact [place] Please consult [email or web address] for contact info under   Signed, Osvaldo Shipper, MD  10/13/2019 6:14 AM   I have personally seen and examined this patient. I agree with the assessment and plan as outlined above.  She is a 69 yo female with h/o GERD presenting with chest pain. She has had chest pain on/off for two days. No prior cardiac history.  Labs reviewed.  First troponin 92 EKG reviewed by me and shows sinus with inferior and anterolateral ST depression with subtle lateral ST elevation My exam: General: Well developed, well nourished, NAD  HEENT: OP clear, mucus membranes moist  SKIN: warm, dry. No rashes. Neuro: No focal deficits  Musculoskeletal: Muscle strength 5/5 all ext  Psychiatric: Mood and affect normal  Neck: No  JVD, no carotid bruits, no thyromegaly, no lymphadenopathy.  Lungs:Clear bilaterally, no wheezes, rhonci, crackles Cardiovascular: Regular rate and rhythm. No murmurs, gallops or rubs. Abdomen:Soft. Bowel sounds present. Non-tender.  Extremities: No lower extremity edema. Pulses are 2 + in  the bilateral DP/PT.  Plan: NSTEMI: Plan cardiac cath this am. With ongoing chest pain, will plan first case. Continue heparin, NTG and ASA.   Danita Proud] 10/13/2019 6:54 AM

## 2019-10-13 NOTE — ED Provider Notes (Signed)
[facility] EMERGENCY DEPARTMENT Provider Note   CSN: [id number] Arrival date & time: 10/13/19  0418     History Chief Complaint  Patient presents with  . Chest Pain    Valerie Fields is a 69 y.o. female.   Chest Pain Pain location:  Substernal area and L chest Pain quality: aching and pressure   Pain radiates to:  Does not radiate Pain severity:  Mild Duration:  2 days Timing:  Intermittent Chronicity:  New Context: not breathing   Relieved by:  Nothing Worsened by:  Nothing Ineffective treatments:  None tried Associated symptoms: diaphoresis, nausea and weakness        Past Medical History:  Diagnosis Date  . Allergy   . Asthma   . Diverticulitis   . GERD (gastroesophageal reflux disease)     There are no problems to display for this patient.   Past Surgical History:  Procedure Laterality Date  . BREAST BIOPSY Left    No Scar seen   . BREAST CYST EXCISION Left    No scar seen   . COLONOSCOPY    . TONSILLECTOMY       OB History   No obstetric history on file.     Family History  Problem Relation Age of Onset  . GER disease Father   . Aneurysm Mother   . Stomach cancer Maternal Grandfather   . Colon cancer Neg Hx   . Esophageal cancer Neg Hx   . Pancreatic cancer Neg Hx   . Rectal cancer Neg Hx   . Breast cancer Neg Hx     Social History   Tobacco Use  . Smoking status: Never Smoker  . Smokeless tobacco: Never Used  Vaping Use  . Vaping Use: Never used  Substance Use Topics  . Alcohol use: Yes    Alcohol/week: 1.0 standard drink    Types: 1 Glasses of wine per week  . Drug use: No    Home Medications Prior to Admission medications   Medication Sig Start Date End Date Taking? Authorizing Provider  azelastine (ASTELIN) 0.1 % nasal spray Place 1 spray 2 (two) times daily into both nostrils. Use in each nostril as directed   Yes [provider]  cetirizine (ZYRTEC) 10 MG tablet Take 10 mg by mouth  daily.   Yes [provider]  cholecalciferol (VITAMIN D) 1000 units tablet Take 2,000 Units daily by mouth.   Yes [provider]  Coenzyme Q10 (CO Q 10 PO) Take 1 capsule by mouth daily.   Yes [provider]  dicyclomine (BENTYL) 10 MG capsule Take 1 tablet by mouth every 6 hours as needed. Patient taking differently: Take 10 mg by mouth every 6 (six) hours as needed for spasms.  04/09/18  Yes Levin Erp, PA  docusate sodium (STOOL SOFTENER) 100 MG capsule Take 100 mg by mouth daily.    Yes [provider]  ezetimibe (ZETIA) 10 MG tablet Take 10 mg by mouth daily.   Yes [provider]  ibuprofen (ADVIL) 200 MG tablet Take 200-800 mg by mouth every 6 (six) hours as needed for moderate pain.   Yes [provider]  meloxicam (MOBIC) 15 MG tablet Take 15 mg by mouth daily.  01/14/16  Yes [provider]  metoCLOPramide (REGLAN) 10 MG tablet Take 10 mg by mouth 4 (four) times daily. 10/11/19  Yes [provider]  montelukast (SINGULAIR) 10 MG tablet Take 10 mg by mouth at bedtime.  08/20/15  Yes [provider]  Multiple Minerals (CALCIUM-MAGNESIUM-ZINC) TABS Take 1-2 tablets by mouth daily.   Yes [provider]  psyllium (REGULOID) 0.52 g capsule Take 1.5 g by mouth daily.   Yes [provider]  ciprofloxacin (CIPRO) 500 MG tablet Take 1 tablet (500 mg total) by mouth 2 (two) times daily. Patient not taking: Reported on 10/13/2019 04/09/18   Levin Erp, PA    Allergies    Soy allergy, Sulfa antibiotics, and Eggs or egg-derived products  Review of Systems   Review of Systems  Constitutional: Positive for diaphoresis.  Cardiovascular: Positive for chest pain.  Gastrointestinal: Positive for nausea.  Neurological: Positive for weakness.  All other systems reviewed and are negative.   Physical Exam Updated Vital Signs BP 110/75   Pulse 77   Temp (!) 97.5 F (36.4 C)  (Temporal)   Resp (!) 22   Ht '5\' 2"'  (1.575 m)   Wt 93.9 kg   SpO2 (!) 88%   BMI 37.86 kg/m   Physical Exam Vitals and nursing note reviewed.  Constitutional:      Appearance: She is well-developed.  HENT:     Head: Normocephalic and atraumatic.     Mouth/Throat:     Mouth: Mucous membranes are moist.     Pharynx: Oropharynx is clear.  Eyes:     Pupils: Pupils are equal, round, and reactive to light.  Cardiovascular:     Rate and Rhythm: Normal rate and regular rhythm.  Pulmonary:     Effort: No respiratory distress.     Breath sounds: No stridor.  Abdominal:     General: There is no distension.  Musculoskeletal:        General: No swelling or tenderness. Normal range of motion.     Cervical back: Normal range of motion.  Skin:    General: Skin is warm and dry.  Neurological:     General: No focal deficit present.     Mental Status: She is alert.     ED Results / Procedures / Treatments   Labs (all labs ordered are listed, but only abnormal results are displayed) Labs Reviewed  COMPREHENSIVE METABOLIC PANEL - Abnormal; Notable for the following components:      Result Value   CO2 21 (*)    Glucose, Bld 165 (*)    Total Protein 6.4 (*)    Albumin 3.4 (*)    All other components within normal limits  TROPONIN I (HIGH SENSITIVITY) - Abnormal; Notable for the following components:   Troponin I (High Sensitivity) 92 (*)    All other components within normal limits  SARS CORONAVIRUS 2 BY RT PCR (HOSPITAL ORDER, [facility])  CBC  LIPASE, BLOOD  HEPARIN LEVEL (UNFRACTIONATED)  TROPONIN I (HIGH SENSITIVITY)    EKG EKG Interpretation  Date/Time:  Thursday October 13 2019 04:19:52 EDT Ventricular Rate:  74 PR Interval:    QRS Duration: 85 QT Interval:  434 QTC Calculation: 482 R Axis:   5 Text Interpretation: duplicate, discard Confirmed by Delora Fuel ([id number]) on 10/13/2019 5:59:00 AM   Radiology DG Chest Portable 1 View  Result  Date: 10/13/2019 CLINICAL DATA:  Chest pain EXAM: PORTABLE CHEST 1 VIEW COMPARISON:  None. FINDINGS: Artifact from EKG leads. Interstitial prominence with possibility of a few Kerley lines. Normal heart size for technique. Normal hilar and aortic contours. No visible effusion or pneumothorax. IMPRESSION: Borderline interstitial edema Electronically Signed   By: Monte Fantasia  M.D.   On: 10/13/2019 05:07    Procedures .Critical Care Performed by: Merrily Pew, MD Authorized by: Merrily Pew, MD   Critical care provider statement:    Critical care time (minutes):  45   Critical care was necessary to treat or prevent imminent or life-threatening deterioration of the following conditions:  Cardiac failure and circulatory failure   Critical care was time spent personally by me on the following activities:  Discussions with consultants, evaluation of patient's response to treatment, examination of patient, ordering and performing treatments and interventions, ordering and review of laboratory studies, ordering and review of radiographic studies, pulse oximetry, re-evaluation of patient's condition, obtaining history from patient or surrogate and review of old charts   (including critical care time)  Medications Ordered in ED Medications  nitroGLYCERIN 50 mg in dextrose 5 % 250 mL (0.2 mg/mL) infusion (10 mcg/min Intravenous Rate/Dose Change 10/13/19 0454)  heparin ADULT infusion 100 units/mL (25000 units/264m sodium chloride 0.45%) (850 Units/hr Intravenous New Bag/Given 10/13/19 0559)  heparin bolus via infusion 3,500 Units (3,500 Units Intravenous Bolus from Bag 10/13/19 0601)  fentaNYL (SUBLIMAZE) injection 50 mcg (50 mcg Intravenous Given 10/13/19 06815    ED Course  I have reviewed the triage vital signs and the nursing notes.  Pertinent labs & imaging results that were available during my care of the patient were reviewed by me and considered in my medical decision making (see chart for  details).    MDM Rules/Calculators/A&P                         Concern for ACS vs less likely dissection, will await cxr prior to heparin, ntg infusion started.   Workup as above. Still with CP, repeat ECG with what appears to be evolving changes. Heparin started. Asa already taken at home. Discussed with Dr. ALauna Grillwith cardiology who evaluated and will plan for cath in AM. Hold on code STEMI without clear criteria being met.   Final Clinical Impression(s) / ED Diagnoses Final diagnoses:  NSTEMI (non-ST elevated myocardial infarction) ([facility]    Rx / DRowenaOrders ED Discharge Orders    None       Alonte Wulff, JCorene Cornea MD 10/13/19 [phone]

## 2019-10-13 NOTE — Progress Notes (Addendum)
   Received paged from RN that patient was having very mild chest discomfort. Advised to give dose of sublingual Nitro. Went to bedside. Patient complaining of very mild substernal chest tightness, maybe a little similar to pain prior to presentation. She also reports some shortness of breath. It was initially though that this was due to [place]. She was given some caffeine with some improvement; however, she still has some shortness of breath.   Patient resting comfortably in bed in no acute distress. No significant murmurs noted on exam. Faint crackles, mostly in right base, noted.   Patient had mildly elevated LVEDP with LVEF of 35% on cath. Received IV Lasix 20mg  earlier this evening. Will give another dose now.   Repeat EKG ordered and showed no acute changes from last tracing.  Chest pain already starting to improve after receiving Nitro before I left the room.  Darreld Mclean, PA-C  10/13/2019 5:15 PM

## 2019-10-13 NOTE — ED Triage Notes (Signed)
Pt BIB [facility] from home c/o sudden onset of CP at about 3 am while sleeping. Rated 8/10, sternum, with no radiation. Became diaphoretic and SOB. On EMS arrival, noted pale, given 0.4 NTG x3 with some chest improvement, now rates pain 7/10. HX indigestion. Taken Reglan at home without relief.   Pt state similar episode Monday seen at outpatient clinic where was told she had T wave inversions.   Pt. Place on 2 L O2 by EMS on arrival to home. SpO2 at that time 90%. In route noted decrease to 88%, placed on NR. Now 98% on room air.   20 G IV L hand. HTN BP 187/110 to 130/60 after 3 NTG

## 2019-10-13 NOTE — Interval H&P Note (Signed)
History and Physical Interval Note:  10/13/2019 7:21 AM  Valerie Fields  has presented today for surgery, with the diagnosis of NSTEMI.  The various methods of treatment have been discussed with the patient and family. After consideration of risks, benefits and other options for treatment, the patient has consented to  Procedure(s): LEFT HEART CATH AND CORONARY ANGIOGRAPHY (N/A) as a surgical intervention.  The patient's history has been reviewed, patient examined, no change in status, stable for surgery.  I have reviewed the patient's chart and labs.  Questions were answered to the patient's satisfaction.    Cath Lab Visit (complete for each Cath Lab visit)  Clinical Evaluation Leading to the Procedure:   ACS: Yes.    Non-ACS:  N/A  Lunette Tapp

## 2019-10-13 NOTE — ED Notes (Signed)
Pt placed on radiolucent pads in preparation for cath lab

## 2019-10-13 NOTE — Brief Op Note (Signed)
BRIEF CARDIAC CATHETERIZATION NOTE  10/13/2019  9:05 AM  PATIENT:  Valerie Fields  69 y.o. female  PRE-OPERATIVE DIAGNOSIS:  NSTEMI  POST-OPERATIVE DIAGNOSIS:  NSTEMI  PROCEDURE:  Procedure(s) with comments: LEFT HEART CATH AND CORONARY ANGIOGRAPHY (N/A) CORONARY STENT INTERVENTION (N/A) - mid lad CORONARY BALLOON ANGIOPLASTY (N/A) - diagonal  SURGEON:  Surgeon(s) and Role:    * Lamanda Rudder, MD - Primary  FINDINGS: 1. Severe single-vessel CAD with 99% stenosis of mid LAD involving D1 with TIMI-1 flow. 2. Mildly elevated LVEDP with LVEF 35%. 3. Successful PCI to mid LAD/D1 using Resolute Onyx 2.75 x 22 mm DES and kissing balloon inflation in LAD/D1 with 0% residual stenosis and TIMI-3 flow.  RECOMMENDATIONS: 1. Admit to telemetry. 2. Continue tirofiban infusion x 2 hours. 3. DAPT with aspirin and ticagrelor for 12 months. 4. Aggressive secondary prevention. 5. Obtain transthoracic echocardiogram.  Nelva Bush, MD [facility]

## 2019-10-13 NOTE — Progress Notes (Signed)
ANTICOAGULATION CONSULT NOTE - Initial Consult  Pharmacy Consult for heparin Indication: chest pain/ACS  Allergies  Allergen Reactions  . Sulfa Antibiotics Other (See Comments)    As a child, turned red above neck    Patient Measurements: Height: 5\' 2"  (157.5 cm) Weight: 93.9 kg (207 lb) IBW/kg (Calculated) : 50.1 Heparin Dosing Weight: 72 kg  Vital Signs: Temp: 97.5 F (36.4 C) (07/15 0437) Temp Source: Temporal (07/15 0437) BP: 133/88 (07/15 0420) Pulse Rate: 73 (07/15 0420)  Labs: Recent Labs    10/13/19 0422 10/13/19 0427  HGB 14.7  --   HCT 44.6  --   PLT 301  --   CREATININE  --  0.79  TROPONINIHS 92*  --     Estimated Creatinine Clearance: 72.8 mL/min (by C-G formula based on SCr of 0.79 mg/dL).   Medical History: Past Medical History:  Diagnosis Date  . Allergy   . Asthma   . Diverticulitis   . GERD (gastroesophageal reflux disease)     Medications:  See medication history  Assessment: 69 yo lady to start heparin for CP.  She was not on anticoagulation PTA.  Hg 14.7, PTLC 301 Goal of Therapy:  Heparin level 0.3-0.7 units/ml Monitor platelets by anticoagulation protocol: Yes   Plan:  Heparin 3500 unit bolus and drip at 850 units/hr Check heparin level ~ 6 hours after start Daily HL and CBC Monitor for bleeding complications  Tashima Scarpulla [place]et 10/13/2019,5:38 AM

## 2019-10-13 NOTE — Progress Notes (Signed)
Patient complaining of shortness of breath and chest pressure.  Sande Rives, PA notified.  She stated to give one dose sublingual nitro and that she would come assess patient.    Fine crackles in RLL noted.  EKG obtained and given to [place], [place].

## 2019-10-13 NOTE — Consult Note (Addendum)
Cardiology Consultation:   Patient ID: Valerie Fields MRN: [id number]; DOB: May 15, 1950  Admit date: 10/13/2019 Date of Consult: 10/13/2019  Primary Care Provider: Marda Stalker, [facility] Cardiologist: none [facility] Electrophysiologist:  none  Patient Profile:   Valerie Fields is a 69 y.o. female with a hx of asthma and GERD who is being seen today for the evaluation of chest pain at the request of Dr. Dayna Barker.  History of Present Illness:   Valerie Fields has had stuttering chest pain for the past two days. This morning, chest pain awakened her from sleep and has not abated. She had associated L arm discomfort and back pain. She does not have dyspnea, orthopnea, lightheadedness. She has never had chest pain like this before.    Past Medical History:  Diagnosis Date  . Allergy   . Asthma   . Diverticulitis   . GERD (gastroesophageal reflux disease)     Past Surgical History:  Procedure Laterality Date  . BREAST BIOPSY Left    No Scar seen   . BREAST CYST EXCISION Left    No scar seen   . COLONOSCOPY    . TONSILLECTOMY       Home Medications:  Prior to Admission medications   Medication Sig Start Date End Date Taking? Authorizing Provider  azelastine (ASTELIN) 0.1 % nasal spray Place 1 spray 2 (two) times daily into both nostrils. Use in each nostril as directed   Yes [provider]  cetirizine (ZYRTEC) 10 MG tablet Take 10 mg by mouth daily.   Yes [provider]  cholecalciferol (VITAMIN D) 1000 units tablet Take 2,000 Units daily by mouth.   Yes [provider]  Coenzyme Q10 (CO Q 10 PO) Take 1 capsule by mouth daily.   Yes [provider]  dicyclomine (BENTYL) 10 MG capsule Take 1 tablet by mouth every 6 hours as needed. Patient taking differently: Take 10 mg by mouth every 6 (six) hours as needed for spasms.  04/09/18  Yes Levin Erp, PA  docusate sodium (STOOL SOFTENER) 100 MG capsule Take  100 mg by mouth daily.    Yes [provider]  ezetimibe (ZETIA) 10 MG tablet Take 10 mg by mouth daily.   Yes [provider]  ibuprofen (ADVIL) 200 MG tablet Take 200-800 mg by mouth every 6 (six) hours as needed for moderate pain.   Yes [provider]  meloxicam (MOBIC) 15 MG tablet Take 15 mg by mouth daily.  01/14/16  Yes [provider]  metoCLOPramide (REGLAN) 10 MG tablet Take 10 mg by mouth 4 (four) times daily. 10/11/19  Yes [provider]  montelukast (SINGULAIR) 10 MG tablet Take 10 mg by mouth at bedtime.  08/20/15  Yes [provider]  Multiple Minerals (CALCIUM-MAGNESIUM-ZINC) TABS Take 1-2 tablets by mouth daily.   Yes [provider]  psyllium (REGULOID) 0.52 g capsule Take 1.5 g by mouth daily.   Yes [provider]  ciprofloxacin (CIPRO) 500 MG tablet Take 1 tablet (500 mg total) by mouth 2 (two) times daily. Patient not taking: Reported on 10/13/2019 04/09/18   Levin Erp, [place]    Inpatient Medications: Scheduled Meds:  Continuous Infusions: . heparin 850 Units/hr (10/13/19 0559)  . nitroGLYCERIN 10 mcg/min (10/13/19 0454)   PRN Meds:   Allergies:    Allergies  Allergen Reactions  . Soy Allergy Hives  . Sulfa Antibiotics Other (See Comments)    As a child, turned red above  neck  . Eggs Or Egg-Derived Products Other (See Comments)    Childhood allergy    Social History:   Social History   Socioeconomic History  . Marital status: Divorced    Spouse name: Not on file  . Number of children: 2  . Years of education: Not on file  . Highest education level: Not on file  Occupational History  . Occupation: Retired  Tobacco Use  . Smoking status: Never Smoker  . Smokeless tobacco: Never Used  Vaping Use  . Vaping Use: Never used  Substance and Sexual Activity  . Alcohol use: Yes    Alcohol/week: 1.0 standard drink    Types: 1 Glasses of wine per week  . Drug use: No  .  Sexual activity: Never  Other Topics Concern  . Not on file  Social History Narrative  . Not on file   Social Determinants of Health   Financial Resource Strain:   . Difficulty of Paying Living Expenses:   Food Insecurity:   . Worried About Charity fundraiser in the Last Year:   . Arboriculturist in the Last Year:   Transportation Needs:   . Film/video editor (Medical):   Marland Kitchen Lack of Transportation (Non-Medical):   Physical Activity:   . Days of Exercise per Week:   . Minutes of Exercise per Session:   Stress:   . Feeling of Stress :   Social Connections:   . Frequency of Communication with Friends and Family:   . Frequency of Social Gatherings with Friends and Family:   . Attends Religious Services:   . Active Member of Clubs or Organizations:   . Attends Archivist Meetings:   Marland Kitchen Marital Status:   Intimate Partner Violence:   . Fear of Current or Ex-Partner:   . Emotionally Abused:   Marland Kitchen Physically Abused:   . Sexually Abused:     Family History:    Family History  Problem Relation Age of Onset  . GER disease Father   . Aneurysm Mother   . Stomach cancer Maternal Grandfather   . Colon cancer Neg Hx   . Esophageal cancer Neg Hx   . Pancreatic cancer Neg Hx   . Rectal cancer Neg Hx   . Breast cancer Neg Hx      ROS:  Please see the history of present illness.  All other ROS reviewed and negative.     Physical Exam/Data:   Vitals:   10/13/19 0443 10/13/19 0536 10/13/19 0539 10/13/19 0542  BP:  115/74 113/71 122/76  Pulse:  81 81 86  Resp:  11 12 16   Temp:      TempSrc:      SpO2:  91% 93% 91%  Weight: 93.9 kg     Height: 5\' 2"  (1.575 m)      No intake or output data in the 24 hours ending 10/13/19 0614 Last 3 Weights 10/13/2019 02/03/2019 04/09/2018  Weight (lbs) 207 lb 206 lb 202 lb  Weight (kg) 93.895 kg 93.441 kg 91.627 kg     Body mass index is 37.86 kg/m.  General:  Well nourished, well developed, appearing mildly  uncomfortable HEENT: normal Neck: no JVD Vascular: No carotid bruits; FA pulses 2+ bilaterally without bruits  Cardiac:  normal S1, S2; RRR; no murmur  Lungs:  clear to auscultation bilaterally, no wheezing, rhonchi or rales  Abd: soft, nontender, no hepatomegaly  Ext: no edema Musculoskeletal:  No deformities, BUE and BLE strength  normal and equal Skin: warm and dry  Neuro:  no focal abnormalities noted Psych:  Normal affect   EKG:  Serial EKG were personally reviewed and demonstrates:  Sinus rhythm with ST depressions and <48mm upsloping ST elevations in lead 1 and aVL. ST changes are dynamic.   Relevant CV Studies: none  Laboratory Data:  High Sensitivity Troponin:   Recent Labs  Lab 10/13/19 0422  TROPONINIHS 92*     Chemistry Recent Labs  Lab 10/13/19 0427  NA 140  K 3.6  CL 107  CO2 21*  GLUCOSE 165*  BUN 12  CREATININE 0.79  CALCIUM 8.9  GFRNONAA >60  GFRAA >60  ANIONGAP 12    Recent Labs  Lab 10/13/19 0427  PROT 6.4*  ALBUMIN 3.4*  AST 25  ALT 29  ALKPHOS 57  BILITOT 0.8   Hematology Recent Labs  Lab 10/13/19 0422  WBC 9.7  RBC 4.67  HGB 14.7  HCT 44.6  MCV 95.5  MCH 31.5  MCHC 33.0  RDW 12.7  PLT 301   BNPNo results for input(s): BNP, PROBNP in the last 168 hours.  DDimer No results for input(s): DDIMER in the last 168 hours.   Radiology/Studies:  DG Chest Portable 1 View  Result Date: 10/13/2019 CLINICAL DATA:  Chest pain EXAM: PORTABLE CHEST 1 VIEW COMPARISON:  None. FINDINGS: Artifact from EKG leads. Interstitial prominence with possibility of a few Kerley lines. Normal heart size for technique. Normal hilar and aortic contours. No visible effusion or pneumothorax. IMPRESSION: Borderline interstitial edema Electronically Signed   By: Monte Fantasia M.D.   On: 10/13/2019 05:07    TIMI Risk Score for Unstable Angina or Non-ST Elevation MI:   The patient's TIMI risk score is 5, which indicates a 26% risk of all cause mortality, new  or recurrent myocardial infarction or need for urgent revascularization in the next 14 days.      Assessment and Plan:   1. NSTEMI She is presenting with dynamic ST segment changes and chest pain that is persistent despite maximally tolerated nitroglycerin drip (hypotension). Therefore, I recommend urgent coronary angiography. The one piece of her case that doesn't fit is only mildly elevated troponin after 2 days of chest pain, suggesting that either (1) this is not a plaque rupture event or (2) that she had unstable angina for two days with type 1 event that woke her from sleep this morning. I discussed with her and her friend recommendation for heart catheterization and she is in agreement.  - ASA 324 given in ED - heparin drip - nitro drip targeting chest pain free  - serial troponin  - admit to cardiology after LHC   For questions or updates, please contact [place] Please consult [email or web address] for contact info under   Signed, Osvaldo Shipper, MD  10/13/2019 6:14 AM   I have personally seen and examined this patient. I agree with the assessment and plan as outlined above.  She is a 69 yo female with h/o GERD presenting with chest pain. She has had chest pain on/off for two days. No prior cardiac history.  Labs reviewed.  First troponin 92 EKG reviewed by me and shows sinus with inferior and anterolateral ST depression with subtle lateral ST elevation My exam: General: Well developed, well nourished, NAD  HEENT: OP clear, mucus membranes moist  SKIN: warm, dry. No rashes. Neuro: No focal deficits  Musculoskeletal: Muscle strength 5/5 all ext  Psychiatric: Mood and affect normal  Neck: No  JVD, no carotid bruits, no thyromegaly, no lymphadenopathy.  Lungs:Clear bilaterally, no wheezes, rhonci, crackles Cardiovascular: Regular rate and rhythm. No murmurs, gallops or rubs. Abdomen:Soft. Bowel sounds present. Non-tender.  Extremities: No lower extremity edema. Pulses are 2 + in  the bilateral DP/PT.  Plan: NSTEMI: Plan cardiac cath this am. With ongoing chest pain, will plan first case. Continue heparin, NTG and ASA.   Valerie Fields] 10/13/2019 6:54 AM

## 2019-10-14 LAB — CBC
HCT: 46.2 % — ABNORMAL HIGH (ref 36.0–46.0)
Hemoglobin: 15.7 g/dL — ABNORMAL HIGH (ref 12.0–15.0)
MCH: 31.5 pg (ref 26.0–34.0)
MCHC: 34 g/dL (ref 30.0–36.0)
MCV: 92.8 fL (ref 80.0–100.0)
Platelets: 332 10*3/uL (ref 150–400)
RBC: 4.98 MIL/uL (ref 3.87–5.11)
RDW: 12.9 % (ref 11.5–15.5)
WBC: 15.2 10*3/uL — ABNORMAL HIGH (ref 4.0–10.5)
nRBC: 0 % (ref 0.0–0.2)

## 2019-10-14 LAB — BASIC METABOLIC PANEL
Anion gap: 13 (ref 5–15)
BUN: 8 mg/dL (ref 8–23)
CO2: 22 mmol/L (ref 22–32)
Calcium: 9.6 mg/dL (ref 8.9–10.3)
Chloride: 101 mmol/L (ref 98–111)
Creatinine, Ser: 0.79 mg/dL (ref 0.44–1.00)
GFR calc Af Amer: >60 mL/min (ref >60)
GFR calc non Af Amer: >60 mL/min (ref >60)
Glucose, Bld: 118 mg/dL — ABNORMAL HIGH (ref 70–99)
Potassium: 3.6 mmol/L (ref 3.5–5.1)
Sodium: 136 mmol/L (ref 135–145)

## 2019-10-14 LAB — TROPONIN I (HIGH SENSITIVITY): Troponin I (High Sensitivity): >27000 ng/L (ref <18)

## 2019-10-14 MED ORDER — METOPROLOL SUCCINATE ER 25 MG PO TB24
25.0000 mg | ORAL_TABLET | Freq: Every day | ORAL | Status: DC
  Administered 2019-10-14 – 2019-10-15 (×2): 25 mg via ORAL
  Filled 2019-10-14 (×2): qty 1

## 2019-10-14 MED ORDER — LOSARTAN POTASSIUM 25 MG PO TABS
25.0000 mg | ORAL_TABLET | Freq: Every day | ORAL | Status: DC
  Administered 2019-10-14 – 2019-10-15 (×2): 25 mg via ORAL
  Filled 2019-10-14 (×2): qty 1

## 2019-10-14 MED ORDER — SODIUM CHLORIDE 0.9 % IV SOLN: 250.0000 mL | INTRAVENOUS | Status: DC | PRN

## 2019-10-14 MED ORDER — SODIUM CHLORIDE 0.9% FLUSH: 3.0000 mL | INTRAVENOUS | Status: DC | PRN

## 2019-10-14 MED ORDER — TICAGRELOR 90 MG PO TABS: 90.0000 mg | ORAL_TABLET | Freq: Two times a day (BID) | ORAL | 11 refills | Status: DC

## 2019-10-14 MED ORDER — SODIUM CHLORIDE 0.9% FLUSH: 3.0000 mL | Freq: Two times a day (BID) | INTRAVENOUS | Status: DC

## 2019-10-14 MED ORDER — POTASSIUM CHLORIDE CRYS ER 20 MEQ PO TBCR
40.0000 meq | EXTENDED_RELEASE_TABLET | Freq: Once | ORAL | Status: AC
  Administered 2019-10-14: 40 meq via ORAL
  Filled 2019-10-14: qty 2

## 2019-10-14 MED FILL — BRILINTA 90 MG TABLET: 90 | 30 days supply | Qty: 60 | Fill #0

## 2019-10-14 NOTE — Discharge Instructions (Addendum)
Post NSTEMI: NO HEAVY LIFTING X 2 WEEKS. NO SEXUAL ACTIVITY X 2 WEEKS. NO DRIVING X 1 WEEK. NO SOAKING BATHS, HOT TUBS, POOLS, ETC., X 7 DAYS.  Radial Site Care: Refer to this sheet in the next few weeks. These instructions provide you with information on caring for yourself after your procedure. Your caregiver may also give you more specific instructions. Your treatment has been planned according to current medical practices, but problems sometimes occur. Call your caregiver if you have any problems or questions after your procedure. HOME CARE INSTRUCTIONS  You may shower the day after the procedure.Remove the bandage (dressing) and gently wash the site with plain soap and water.Gently pat the site dry.   Do not apply powder or lotion to the site.   Do not submerge the affected site in water for 3 to 5 days.   Inspect the site at least twice daily.   Do not flex or bend the affected arm for 24 hours.   No lifting over 5 pounds (2.3 kg) for 5 days after your procedure.   Do not drive home if you are discharged the same day of the procedure. Have someone else drive you.  What to expect:  Any bruising will usually fade within 1 to 2 weeks.   Blood that collects in the tissue (hematoma) may be painful to the touch. It should usually decrease in size and tenderness within 1 to 2 weeks.  SEEK IMMEDIATE MEDICAL CARE IF:  You have unusual pain at the radial site.   You have redness, warmth, swelling, or pain at the radial site.   You have drainage (other than a small amount of blood on the dressing).   You have chills.   You have a fever or persistent symptoms for more than 72 hours.   You have a fever and your symptoms suddenly get worse.   Your arm becomes pale, cool, tingly, or numb.   You have heavy bleeding from the site. Hold pressure on the site.    Information about your medication: Brilinta (anti-platelet agent)  Generic Name (Brand): ticagrelor (Brilinta), twice  daily medication  PURPOSE: You are taking this medication along with aspirin to lower your chance of having a heart attack, stroke, or blood clots in your heart stent. These can be fatal. Brilinta and aspirin help prevent platelets from sticking together and forming a clot that can block an artery or your stent.   Common SIDE EFFECTS you may experience include: bruising or bleeding more easily, shortness of breath  Do not stop taking BRILINTA without talking to the doctor who prescribes it for you. People who are treated with a stent and stop taking Brilinta too soon, have a higher risk of getting a blood clot in the stent, having a heart attack, or dying. If you stop Brilinta because of bleeding, or for other reasons, your risk of a heart attack or stroke may increase.   Tell all of your doctors and dentists that you are taking Brilinta. They should talk to the doctor who prescribed Brilinta for you before you have any surgery or invasive procedure.   Contact your health care provider if you experience: severe or uncontrollable bleeding, pink/red/brown urine, vomiting blood or vomit that looks like "coffee grounds", red or black stools (looks like tar), coughing up blood or blood clots ----------------------------------------------------------------------------------------------------------------------  Information about your medication:  Beta Blocker (Heart rate/Blood Pressure-lowering agent)  Generic Name (Brand): metoprolol tartrate (Lopressor), metoprolol succinate (Toprol XL) PURPOSE: You are  taking this medication to lower blood pressure and heart rate to help prevent further damage to your heart and to reduce your risk of death following your cardiac event.   Common SIDE EFFECTS you may experience include: fatigue, dizziness, diarrhea, or nausea. This medication may also mask the signs of hypoglycemia  Take your medication exactly as prescribed.   Contact your health care provider if  you experience: severely lower bllod pressure, syncope, palpitations, or chest pain muscle ----------------------------------------------------------------------------------------------------------------------  Information about your medication: Statin (cholesterol-lowering agent)  Generic Name (Brand): atorvastatin (Lipitor)  PURPOSE: You are taking this medication to lower your "bad" cholesterol (LDL) and to prevent heart attacks and strokes. Statins can also raise your "good" cholesterol (HDL).  Common SIDE EFFECTS you may experience include: muscle pain or weakness (especially in the legs) and upset stomach.  Take your medication exactly as prescribed. Do not eat large amounts of grapefruit or grapefruit juice while taking this medication.  Contact your health care provider if you experience: severe muscle pain that does not improve, dark urine, or yellowing of your skin or eyes. ----------------------------------------------------------------------------------------------------------------------    Heart-Healthy Eating Plan Heart-healthy meal planning includes:  Eating less unhealthy fats.  Eating more healthy fats.  Making other changes in your diet. Talk with your doctor or a diet specialist (dietitian) to create an eating plan that is right for you. What is my plan? Your doctor may recommend an eating plan that includes:  Total fat: ______% or less of total calories a day.  Saturated fat: ______% or less of total calories a day.  Cholesterol: less than _________mg a day. What are tips for following this plan? Cooking Avoid frying your food. Try to bake, boil, grill, or broil it instead. You can also reduce fat by:  Removing the skin from poultry.  Removing all visible fats from meats.  Steaming vegetables in water or broth. Meal planning   At meals, divide your plate into four equal parts: ? Fill one-half of your plate with vegetables and green salads. ? Fill  one-fourth of your plate with whole grains. ? Fill one-fourth of your plate with lean protein foods.  Eat 4-5 servings of vegetables per day. A serving of vegetables is: ? 1 cup of raw or cooked vegetables. ? 2 cups of raw leafy greens.  Eat 4-5 servings of fruit per day. A serving of fruit is: ? 1 medium whole fruit. ?  cup of dried fruit. ?  cup of fresh, frozen, or canned fruit. ?  cup of 100% fruit juice.  Eat more foods that have soluble fiber. These are apples, broccoli, carrots, beans, peas, and barley. Try to get 20-30 g of fiber per day.  Eat 4-5 servings of nuts, legumes, and seeds per week: ? 1 serving of dried beans or legumes equals  cup after being cooked. ? 1 serving of nuts is  cup. ? 1 serving of seeds equals 1 tablespoon. General information  Eat more home-cooked food. Eat less restaurant, buffet, and fast food.  Limit or avoid alcohol.  Limit foods that are high in starch and sugar.  Avoid fried foods.  Lose weight if you are overweight.  Keep track of how much salt (sodium) you eat. This is important if you have high blood pressure. Ask your doctor to tell you more about this.  Try to add vegetarian meals each week. Fats  Choose healthy fats. These include olive oil and canola oil, flaxseeds, walnuts, almonds, and seeds.  Eat more omega-3  fats. These include salmon, mackerel, sardines, tuna, flaxseed oil, and ground flaxseeds. Try to eat fish at least 2 times each week.  Check food labels. Avoid foods with trans fats or high amounts of saturated fat.  Limit saturated fats. ? These are often found in animal products, such as meats, butter, and cream. ? These are also found in plant foods, such as palm oil, palm kernel oil, and coconut oil.  Avoid foods with partially hydrogenated oils in them. These have trans fats. Examples are stick margarine, some tub margarines, cookies, crackers, and other baked goods. What foods can I eat? Fruits All  fresh, canned (in natural juice), or frozen fruits. Vegetables Fresh or frozen vegetables (raw, steamed, roasted, or grilled). Green salads. Grains Most grains. Choose whole wheat and whole grains most of the time. Rice and pasta, including brown rice and pastas made with whole wheat. Meats and other proteins Lean, well-trimmed beef, veal, pork, and lamb. Chicken and [place] without skin. All fish and shellfish. Wild duck, rabbit, pheasant, and venison. Egg whites or low-cholesterol egg substitutes. Dried beans, peas, lentils, and tofu. Seeds and most nuts. Dairy Low-fat or nonfat cheeses, including ricotta and mozzarella. Skim or 1% milk that is liquid, powdered, or evaporated. Buttermilk that is made with low-fat milk. Nonfat or low-fat yogurt. Fats and oils Non-hydrogenated (trans-free) margarines. Vegetable oils, including soybean, sesame, sunflower, olive, peanut, safflower, corn, canola, and cottonseed. Salad dressings or mayonnaise made with a vegetable oil. Beverages Mineral water. Coffee and tea. Diet carbonated beverages. Sweets and desserts Sherbet, gelatin, and fruit ice. Small amounts of dark chocolate. Limit all sweets and desserts. Seasonings and condiments All seasonings and condiments. The items listed above may not be a complete list of foods and drinks you can eat. Contact a dietitian for more options. What foods should I avoid? Fruits Canned fruit in heavy syrup. Fruit in cream or butter sauce. Fried fruit. Limit coconut. Vegetables Vegetables cooked in cheese, cream, or butter sauce. Fried vegetables. Grains Breads that are made with saturated or trans fats, oils, or whole milk. Croissants. Sweet rolls. Donuts. High-fat crackers, such as cheese crackers. Meats and other proteins Fatty meats, such as hot dogs, ribs, sausage, bacon, rib-eye roast or steak. High-fat deli meats, such as salami and bologna. Caviar. Domestic duck and goose. Organ meats, such as  liver. Dairy Cream, sour cream, cream cheese, and creamed cottage cheese. Whole-milk cheeses. Whole or 2% milk that is liquid, evaporated, or condensed. Whole buttermilk. Cream sauce or high-fat cheese sauce. Yogurt that is made from whole milk. Fats and oils Meat fat, or shortening. Cocoa butter, hydrogenated oils, palm oil, coconut oil, palm kernel oil. Solid fats and shortenings, including bacon fat, salt pork, lard, and butter. Nondairy cream substitutes. Salad dressings with cheese or sour cream. Beverages Regular sodas and juice drinks with added sugar. Sweets and desserts Frosting. Pudding. Cookies. Cakes. Pies. Milk chocolate or white chocolate. Buttered syrups. Full-fat ice cream or ice cream drinks. The items listed above may not be a complete list of foods and drinks to avoid. Contact a dietitian for more information. Summary  Heart-healthy meal planning includes eating less unhealthy fats, eating more healthy fats, and making other changes in your diet.  Eat a balanced diet. This includes fruits and vegetables, low-fat or nonfat dairy, lean protein, nuts and legumes, whole grains, and heart-healthy oils and fats. This information is not intended to replace advice given to you by your health care provider. Make sure you discuss any questions  you have with your health care provider. Document Revised: 05/21/2017 Document Reviewed: 04/24/2017 Elsevier Patient Education  2020 [employer].

## 2019-10-14 NOTE — Progress Notes (Signed)
° °  Patient will likely be discharged tomorrow. Brilinta was sent to [place] today per patient's request. Did not send other medications to [facility] in case any additional changes are needed so other medications will need to be sent to patient's primary pharmacy. I will send weekend team a message to notify them.  Darreld Mclean, PA-C 10/14/2019 4:47 PM

## 2019-10-14 NOTE — Progress Notes (Signed)
TR BAND REMOVAL  LOCATION:    right radial  DEFLATED PER PROTOCOL:    Yes.    TIME BAND OFF / DRESSING APPLIED:    2050   SITE UPON ARRIVAL:    Level 1( bruised, soft)  SITE AFTER BAND REMOVAL:    Level 1( bruised, soft) CIRCULATION SENSATION AND MOVEMENT:    Within Normal Limits   Yes.    COMMENTS:   Tolerated procedure, post TRB instruction given.

## 2019-10-14 NOTE — Progress Notes (Addendum)
Progress Note  Patient Name: Valerie Fields Date of Encounter: 10/14/2019  [facility] Cardiologist: Lauree Chandler, MD (New Patient)  Subjective   Feeling much better than last night. No recurrent chest pain. She still has some transient shortness of breath that sounds consistent with Brilinta - improved with drinking Coke.   Inpatient Medications    Scheduled Meds: . aspirin  81 mg Oral Daily  . atorvastatin  80 mg Oral Daily  . azelastine  1 spray Each Nare BID  . docusate sodium  100 mg Oral Daily  . enoxaparin (LOVENOX) injection  40 mg Subcutaneous Q24H  . loratadine  10 mg Oral Daily  . metoCLOPramide  10 mg Oral QID  . metoprolol succinate  12.5 mg Oral Daily  . montelukast  10 mg Oral QHS  . sodium chloride flush  3 mL Intravenous Q12H  . ticagrelor  90 mg Oral BID   Continuous Infusions: . sodium chloride     PRN Meds: sodium chloride, acetaminophen, dicyclomine, nitroGLYCERIN, ondansetron (ZOFRAN) IV, sodium chloride flush, zolpidem   Vital Signs    Vitals:   10/13/19 1548 10/13/19 2009 10/14/19 0054 10/14/19 0600  BP: 134/78 140/70 128/61 134/71  Pulse: 84 82 86 85  Resp: 18 20 16 17   Temp: 98.8 F (37.1 C) 98.3 F (36.8 C) 98 F (36.7 C) 98.6 F (37 C)  TempSrc: Oral Oral Oral Oral  SpO2: 99% 97% 96% 97%  Weight:    98.3 kg  Height:        Intake/Output Summary (Last 24 hours) at 10/14/2019 0609 Last data filed at 10/13/2019 2200 Gross per 24 hour  Intake 60 ml  Output 650 ml  Net -590 ml   Last 3 Weights 10/14/2019 10/13/2019 02/03/2019  Weight (lbs) 216 lb 12.8 oz 207 lb 206 lb  Weight (kg) 98.34 kg 93.895 kg 93.441 kg      Telemetry    Sinus rhythm with rates in the 80's to 110's. - Personally Reviewed  ECG    Normal sinus rhythm, rate 84 bpm, with evolving ST changes following MI. Elevated J point with convex ST segment (slightly more prominent today compared to yesterday). Biphasic T waves in pre-cordial leads and T  wave inversions in I and aVL. - Personally Reviewed  Physical Exam   GEN: No acute distress.   Neck: No JVD. Cardiac: RRR. No murmurs, rubs, or gallops. Radial pulses 2+ and equal bilaterally. Right radial cath site soft with ecchymosis of posterior hand and proximal to cath site. Soft and non-tender. Respiratory: Clear to auscultation bilaterally. No wheezes, rhonchi, or rales. GI: Soft, non-tender, non-distended  MS: No edema; No deformity. Neuro:  Nonfocal  Psych: Normal affect   Labs    High Sensitivity Troponin:   Recent Labs  Lab 10/13/19 0422 10/13/19 0643 10/13/19 1502 10/13/19 2157  TROPONINIHS 92* 216* >27,000* >27,000*      Chemistry Recent Labs  Lab 10/13/19 0427 10/14/19 0253  NA 140 136  K 3.6 3.6  CL 107 101  CO2 21* 22  GLUCOSE 165* 118*  BUN 12 8  CREATININE 0.79 0.79  CALCIUM 8.9 9.6  PROT 6.4*  --   ALBUMIN 3.4*  --   AST 25  --   ALT 29  --   ALKPHOS 57  --   BILITOT 0.8  --   GFRNONAA >60 >60  GFRAA >60 >60  ANIONGAP 12 13     Hematology Recent Labs  Lab 10/13/19 0422  WBC  9.7  RBC 4.67  HGB 14.7  HCT 44.6  MCV 95.5  MCH 31.5  MCHC 33.0  RDW 12.7  PLT 301    BNPNo results for input(s): BNP, PROBNP in the last 168 hours.   DDimer No results for input(s): DDIMER in the last 168 hours.   Radiology    CARDIAC CATHETERIZATION  Result Date: 10/13/2019 Conclusions: 1. Severe single-vessel CAD with 99% stenosis of mid LAD involving D1 with TIMI-1 flow. 2. Moderately to severely reduced left ventricular systolic function with mid and apical anterior hypokinesis/akinesis.  LVEF ~35%. 3. Mildly elevated left ventricular filling pressure (LVEDP 20-25 mmHg). 4. Successful PCI to mid LAD/D1 using Resolute Onyx 2.75 x 22 mm drug-eluting stent and kissing balloon inflation in LAD/D1 with 0% residual stenosis and TIMI-3 flow.  Recommendations: 1. Admit to telemetry. 2. Continue tirofiban infusion x 2 hours. 3. Dual antiplatelet therapy with  aspirin and ticagrelor for 12 months. 4. Aggressive secondary prevention, including high-intensity statin therapy. 5. Obtain transthoracic echocardiogram. 6. Initiate low dose metoprolol for management of ischemic cardiomyopathy.  Further escalation of goal-directed medical therapy as tolerated, including addition of ACEI/ARB prior to discharge. Nelva Bush, MD [facility]   DG Chest Portable 1 View  Result Date: 10/13/2019 CLINICAL DATA:  Chest pain EXAM: PORTABLE CHEST 1 VIEW COMPARISON:  None. FINDINGS: Artifact from EKG leads. Interstitial prominence with possibility of a few Kerley lines. Normal heart size for technique. Normal hilar and aortic contours. No visible effusion or pneumothorax. IMPRESSION: Borderline interstitial edema Electronically Signed   By: Monte Fantasia M.D.   On: 10/13/2019 05:07   ECHOCARDIOGRAM COMPLETE  Result Date: 10/13/2019    ECHOCARDIOGRAM REPORT   Patient Name:   Valerie Fields [facility] Date of Exam: 10/13/2019 Medical Rec #:  [id number]            Height:       62.0 in Accession #:    [id number]           Weight:       207.0 lb Date of Birth:  1951/02/28            BSA:          1.940 m Patient Age:    69 years             BP:           133/82 mmHg Patient Gender: F                    HR:           86 bpm. Exam Location:  Inpatient Procedure: 2D Echo, Cardiac Doppler, Color Doppler and Intracardiac            Opacification Agent Indications:    NSTEMI I21.4  History:        Patient has no prior history of Echocardiogram examinations.                 Arrythmias:non-specific ST changes; Risk Factors:Non-Smoker.  Sonographer:    Vickie Epley RDCS Referring Phys: [phone] Richele Strand END IMPRESSIONS  1. Left ventricular ejection fraction, by estimation, is 40 to 45%. The left ventricle has mildly decreased function. The left ventricle demonstrates regional wall motion abnormalities (see scoring diagram/findings for description). Mid to apical anterior/anteroseptal akinesis.  Left ventricular diastolic parameters are consistent with Grade II diastolic dysfunction (pseudonormalization). Elevated left atrial pressure.  2. Right ventricular systolic function is normal. The right ventricular size is normal. There is  normal pulmonary artery systolic pressure. The estimated right ventricular systolic pressure is 27.7 mmHg.  3. The mitral valve is normal in structure. Trivial mitral valve regurgitation.  4. The aortic valve is tricuspid. Aortic valve regurgitation is not visualized. No aortic stenosis is present.  5. The inferior vena cava is normal in size with greater than 50% respiratory variability, suggesting right atrial pressure of 3 mmHg. FINDINGS  Left Ventricle: Left ventricular ejection fraction, by estimation, is 40 to 45%. The left ventricle has mildly decreased function. The left ventricle demonstrates regional wall motion abnormalities. Definity contrast agent was given IV to delineate the left ventricular endocardial borders. The left ventricular internal cavity size was normal in size. There is no left ventricular hypertrophy. Left ventricular diastolic parameters are consistent with Grade II diastolic dysfunction (pseudonormalization). Elevated left atrial pressure.  LV Wall Scoring: The mid and distal anterior wall, mid and distal anterior septum, and apex are akinetic. The entire lateral wall, entire inferior wall, basal anteroseptal segment, mid inferoseptal segment, basal anterior segment, and basal inferoseptal segment are normal. Right Ventricle: The right ventricular size is normal. Right vetricular wall thickness was not assessed. Right ventricular systolic function is normal. There is normal pulmonary artery systolic pressure. The tricuspid regurgitant velocity is 2.26 m/s, and with an assumed right atrial pressure of 3 mmHg, the estimated right ventricular systolic pressure is 82.4 mmHg. Left Atrium: Left atrial size was normal in size. Right Atrium: Right atrial  size was not well visualized. Pericardium: Trivial pericardial effusion is present. Mitral Valve: The mitral valve is normal in structure. Moderate mitral annular calcification. Trivial mitral valve regurgitation. Tricuspid Valve: The tricuspid valve is normal in structure. Tricuspid valve regurgitation is trivial. Aortic Valve: The aortic valve is tricuspid. Aortic valve regurgitation is not visualized. No aortic stenosis is present. Pulmonic Valve: The pulmonic valve was not well visualized. Pulmonic valve regurgitation is not visualized. Aorta: The aortic root is normal in size and structure. Venous: The inferior vena cava is normal in size with greater than 50% respiratory variability, suggesting right atrial pressure of 3 mmHg. IAS/Shunts: The interatrial septum was not well visualized.  LEFT VENTRICLE PLAX 2D LVOT diam:     1.90 cm     Diastology LV SV:         50          LV e' lateral:   4.99 cm/s LV SV Index:   26          LV E/e' lateral: 20.2 LVOT Area:     2.84 cm    LV e' medial:    4.47 cm/s                            LV E/e' medial:  22.6  LV Volumes (MOD) LV vol d, MOD A2C: 89.1 ml LV vol d, MOD A4C: 77.2 ml LV vol s, MOD A2C: 49.8 ml LV vol s, MOD A4C: 43.1 ml LV SV MOD A2C:     39.3 ml LV SV MOD A4C:     77.2 ml LV SV MOD BP:      36.0 ml LEFT ATRIUM             Index LA Vol (A2C):   29.2 ml 15.05 ml/m LA Vol (A4C):   30.3 ml 15.62 ml/m LA Biplane Vol: 31.6 ml 16.29 ml/m  AORTIC VALVE LVOT Vmax:   92.00 cm/s LVOT Vmean:  61.900 cm/s LVOT VTI:  0.175 m  AORTA Ao Root diam: 3.30 cm MITRAL VALVE                TRICUSPID VALVE MV Area (PHT): 4.60 cm     TR Peak grad:   20.4 mmHg MV Decel Time: 165 msec     TR Vmax:        226.00 cm/s MV E velocity: 101.00 cm/s MV A velocity: 138.00 cm/s  SHUNTS MV E/A ratio:  0.73         Systemic VTI:  0.18 m                             Systemic Diam: 1.90 cm Oswaldo Milian MD Electronically signed by Oswaldo Milian MD Signature Date/Time:  10/13/2019/10:25:22 PM    Final     Cardiac Studies   Left Heart Catheterization 10/13/2019: Conclusions: 1. Severe single-vessel CAD with 99% stenosis of mid LAD involving D1 with TIMI-1 flow. 2. Moderately to severely reduced left ventricular systolic function with mid and apical anterior hypokinesis/akinesis.  LVEF ~35%. 3. Mildly elevated left ventricular filling pressure (LVEDP 20-25 mmHg). 4. Successful PCI to mid LAD/D1 using Resolute Onyx 2.75 x 22 mm drug-eluting stent and kissing balloon inflation in LAD/D1 with 0% residual stenosis and TIMI-3 flow.  Recommendations: 1. Admit to telemetry. 2. Continue tirofiban infusion x 2 hours. 3. Dual antiplatelet therapy with aspirin and ticagrelor for 12 months. 4. Aggressive secondary prevention, including high-intensity statin therapy. 5. Obtain transthoracic echocardiogram. 6. Initiate low dose metoprolol for management of ischemic cardiomyopathy.  Further escalation of goal-directed medical therapy as tolerated, including addition of ACEI/ARB prior to discharge. _______________  Echocardiogram 10/13/2019: Impressions: 1. Left ventricular ejection fraction, by estimation, is 40 to 45%. The  left ventricle has mildly decreased function. The left ventricle  demonstrates regional wall motion abnormalities (see scoring  diagram/findings for description). Mid to apical  anterior/anteroseptal akinesis. Left ventricular diastolic parameters are  consistent with Grade II diastolic dysfunction (pseudonormalization).  Elevated left atrial pressure.  2. Right ventricular systolic function is normal. The right ventricular  size is normal. There is normal pulmonary artery systolic pressure. The  estimated right ventricular systolic pressure is 16.9 mmHg.  3. The mitral valve is normal in structure. Trivial mitral valve  regurgitation.  4. The aortic valve is tricuspid. Aortic valve regurgitation is not  visualized. No aortic stenosis is  present.  5. The inferior vena cava is normal in size with greater than 50%  respiratory variability, suggesting right atrial pressure of 3 mmHg.   Patient Profile     69 y.o. female with a history of asthma and GERD who presented with NSTEMI on 10/13/2019.  Assessment & Plan    NSTEMI  - High-sensitivity troponin peaked at >27,000.  - Cardiac catheterization showed severe single-vessel CAD with 99% stenosis of mid LAD involving D1. LVEF about 35% with mildly elevated LVEDP of 20-53mmHg. S/p successful PCI with DES and kissing balloon inflation to LAD/D1. She was treated with Tirofiban infusion x2 hours.  - Echo showed LVEF of 40-45% with mid to apical anterior/anterospetal akineses and grade 2 diastolic dysfunction.  - Started on DAPT with Aspirin and Brilinta yesterday. - Continue beta-blocker and high-intensity statin.  Ischemic Cardiomyopathy  - Chest x-ray showed borderline interstitial edema. - Echo showed LVEF of 40-45% with mid to apical anterior/anterospetal akineses and grade 2 diastolic dysfunction.  - LVEDP 20-48mmHg on cath. She was given one dose of  IV Lasix 20mg  after cath and a 2nd dose yesterday evening given shortness of breath. - Started on Toprol-XL 12.5mg  daily yesterday. Will increase to 25mg  daily. - Will add Losartan 25mg  daily.  Hyperlipidemia  Lipid panel this admission: Total Cholesterol 176, Triglycerides 161, HDL 48, LDL 96. LDL goal <70 given CAD.  - Started on Lipitor 80mg  daily.  - Also on Zetia at home. She would like to stop this at discharge which I think is OK since we are starting high-intensity statin. - Will needed repeat lipid panel and LFTs in 6-8 weeks.   Disposition: Patient had a large infarct; however, she looks good today. Will discuss with MD about whether OK for discharge today or whether we should monitor overnight and continue to titrate medications.   For questions or updates, please contact [place] Please consult  [email or web address] for contact info under        Signed, Darreld Mclean, PA-C  10/14/2019, 6:09 AM    I have personally seen and examined this patient. I agree with the assessment and plan as outlined above.  She is doing well this am post LAD infarct, stenting. Continue ASA and Brilinta. Continue statin and beta blocker. Add ARB.  Ambulate today. Discharge tomorrow.   Lauree Chandler 10/14/2019 9:41 AM

## 2019-10-14 NOTE — Progress Notes (Signed)
CARDIAC REHAB PHASE I   PRE:  Rate/Rhythm: 97 SR  BP:  Supine:   Sitting: 137/76  Standing:    SaO2: 96%RA  MODE:  Ambulation: 325 ft   POST:  Rate/Rhythm: 112 ST  BP:  Supine:   Sitting: 132/69  Standing:    SaO2: 97%RA 0805-0854 Pt walked 325 ft on RA with slow steady gait. Stopped once to rest. No CP. MI education completed with pt who voiced understanding. Stressed importance of brilinta with stent. Reviewed NTG use, MI restrictions,risk factors, heart healthy food choices, walking for ex (pt has left ankle stress fracture and knows to adhere to restrictions given by MD), and CRP 2.  Pt very interested in [place] CRP 2. Has boot for stress fracture she is to wear.    Graylon Good, RN BSN  10/14/2019 8:51 AM

## 2019-10-15 ENCOUNTER — Encounter (HOSPITAL_COMMUNITY): Payer: Self-pay | Admitting: Internal Medicine

## 2019-10-15 DIAGNOSIS — E785 Hyperlipidemia, unspecified: Secondary | ICD-10-CM

## 2019-10-15 DIAGNOSIS — K219 Gastro-esophageal reflux disease without esophagitis: Secondary | ICD-10-CM

## 2019-10-15 DIAGNOSIS — I5042 Chronic combined systolic (congestive) and diastolic (congestive) heart failure: Secondary | ICD-10-CM

## 2019-10-15 DIAGNOSIS — I255 Ischemic cardiomyopathy: Secondary | ICD-10-CM

## 2019-10-15 DIAGNOSIS — I5032 Chronic diastolic (congestive) heart failure: Secondary | ICD-10-CM

## 2019-10-15 HISTORY — DX: Hyperlipidemia, unspecified: E78.5

## 2019-10-15 HISTORY — DX: Ischemic cardiomyopathy: I25.5

## 2019-10-15 HISTORY — DX: Chronic combined systolic (congestive) and diastolic (congestive) heart failure: I50.42

## 2019-10-15 MED ORDER — NITROGLYCERIN 0.4 MG SL SUBL: 0.4000 mg | SUBLINGUAL_TABLET | SUBLINGUAL | 12 refills | Status: DC | PRN

## 2019-10-15 MED ORDER — ATORVASTATIN CALCIUM 80 MG PO TABS: 80.0000 mg | ORAL_TABLET | Freq: Every day | ORAL | 11 refills | Status: DC

## 2019-10-15 MED ORDER — LOSARTAN POTASSIUM 25 MG PO TABS: 25.0000 mg | ORAL_TABLET | Freq: Every day | ORAL | 11 refills | Status: DC

## 2019-10-15 MED ORDER — METOPROLOL SUCCINATE ER 25 MG PO TB24: 25.0000 mg | ORAL_TABLET | Freq: Every day | ORAL | 11 refills | Status: DC

## 2019-10-15 MED ORDER — ACETAMINOPHEN 325 MG PO TABS: 650.0000 mg | ORAL_TABLET | ORAL | Status: DC | PRN

## 2019-10-15 MED ORDER — ASPIRIN 81 MG PO CHEW: 81.0000 mg | CHEWABLE_TABLET | Freq: Every day | ORAL | Status: DC

## 2019-10-15 NOTE — Progress Notes (Addendum)
Progress Note  Patient Name: Valerie Fields Date of Encounter: 10/15/2019  [facility] Cardiologist: Lauree Chandler, MD    Subjective   She has not had any chest pain.  Her shortness of breath has been related to Ticagrelor and is overall improved.  She is eager to go home today.   Inpatient Medications    Scheduled Meds: . aspirin  81 mg Oral Daily  . atorvastatin  80 mg Oral Daily  . azelastine  1 spray Each Nare BID  . docusate sodium  100 mg Oral Daily  . enoxaparin (LOVENOX) injection  40 mg Subcutaneous Q24H  . loratadine  10 mg Oral Daily  . losartan  25 mg Oral Daily  . metoCLOPramide  10 mg Oral QID  . metoprolol succinate  25 mg Oral Daily  . montelukast  10 mg Oral QHS  . sodium chloride flush  3 mL Intravenous Q12H  . sodium chloride flush  3 mL Intravenous Q12H  . ticagrelor  90 mg Oral BID   Continuous Infusions: . sodium chloride    . sodium chloride     PRN Meds: sodium chloride, sodium chloride, acetaminophen, dicyclomine, nitroGLYCERIN, ondansetron (ZOFRAN) IV, sodium chloride flush, sodium chloride flush, zolpidem   Vital Signs    Vitals:   10/14/19 1607 10/14/19 1953 10/15/19 0033 10/15/19 0513  BP: 105/60 115/61 (!) 99/53 (!) 105/52  Pulse: 73 78 70 80  Resp:  18 15 17   Temp: 98.8 F (37.1 C) 98.3 F (36.8 C) 98.1 F (36.7 C) 98.9 F (37.2 C)  TempSrc: Oral Oral Oral Oral  SpO2:  98% 96% 100%  Weight:    97.8 kg  Height:        Intake/Output Summary (Last 24 hours) at 10/15/2019 0741 Last data filed at 10/14/2019 2000 Gross per 24 hour  Intake 582 ml  Output 1400 ml  Net -818 ml   Last 3 Weights 10/15/2019 10/14/2019 10/13/2019  Weight (lbs) 215 lb 11.2 oz 216 lb 12.8 oz 207 lb  Weight (kg) 97.841 kg 98.34 kg 93.895 kg      Telemetry    Normal sinus rhythm  - Personally Reviewed   Physical Exam   GEN: No acute distress.   Neck: No JVD Cardiac: RRR, no murmurs, rubs, or gallops.  Respiratory: Clear to  auscultation bilaterally. GI: Soft, nontender, non-distended  MS: No edema; R wrist with dressing intact, R radial pulse 2+ Neuro:  Nonfocal  Psych: Normal affect   Labs    High Sensitivity Troponin:   Recent Labs  Lab 10/13/19 0422 10/13/19 0643 10/13/19 1502 10/13/19 2157 10/14/19 0554  TROPONINIHS 92* 216* >27,000* >27,000* >27,000*      Chemistry Recent Labs  Lab 10/13/19 0427 10/14/19 0253  NA 140 136  K 3.6 3.6  CL 107 101  CO2 21* 22  GLUCOSE 165* 118*  BUN 12 8  CREATININE 0.79 0.79  CALCIUM 8.9 9.6  PROT 6.4*  --   ALBUMIN 3.4*  --   AST 25  --   ALT 29  --   ALKPHOS 57  --   BILITOT 0.8  --   GFRNONAA >60 >60  GFRAA >60 >60  ANIONGAP 12 13     Hematology Recent Labs  Lab 10/13/19 0422 10/14/19 0850  WBC 9.7 15.2*  RBC 4.67 4.98  HGB 14.7 15.7*  HCT 44.6 46.2*  MCV 95.5 92.8  MCH 31.5 31.5  MCHC 33.0 34.0  RDW 12.7 12.9  PLT 301 332  BNPNo results for input(s): BNP, PROBNP in the last 168 hours.   DDimer No results for input(s): DDIMER in the last 168 hours.   Cardiac Studies   ECHOCARDIOGRAM COMPLETE  10/13/2019 IMPRESSIONS   1. Left ventricular ejection fraction, by estimation, is 40 to 45%. The left ventricle has mildly decreased function. The left ventricle demonstrates regional wall motion abnormalities (see scoring diagram/findings for description). Mid to apical anterior/anteroseptal akinesis. Left ventricular diastolic parameters are consistent with Grade II diastolic dysfunction (pseudonormalization). Elevated left atrial pressure.   2. Right ventricular systolic function is normal. The right ventricular size is normal. There is normal pulmonary artery systolic pressure. The estimated right ventricular systolic pressure is 38.4 mmHg.   3. The mitral valve is normal in structure. Trivial mitral valve regurgitation.   4. The aortic valve is tricuspid. Aortic valve regurgitation is not visualized. No aortic stenosis is present.     5. The inferior vena cava is normal in size with greater than 50% respiratory variability, suggesting right atrial pressure of 3 mmHg.   CARDIAC CATHETERIZATION 10/13/19 LM absent LAD ost 30, prox 99 thrombotic; D1 90 thrombotic LCx normal RCA prox 15, mid 15 EF ~35 PCI: STENT RESOLUTE ONYX DES 2.75X22 to pLAD PCI: POBA to D1  Conclusions: 1. Severe single-vessel CAD with 99% stenosis of mid LAD involving D1 with TIMI-1 flow. 2. Moderately to severely reduced left ventricular systolic function with mid and apical anterior hypokinesis/akinesis.  LVEF ~35%. 3. Mildly elevated left ventricular filling pressure (LVEDP 20-25 mmHg). 4. Successful PCI to mid LAD/D1 using Resolute Onyx 2.75 x 22 mm drug-eluting stent and kissing balloon inflation in LAD/D1 with 0% residual stenosis and TIMI-3 flow.     Patient Profile     69 y.o. female w/ GERD, asthma who was admitted with a non-STEMI.  Urgent cath demonstrated severe single vessel CAD with high grade mid LAD stenosis involving the D1.  The LAD was tx with a DES and the D1 w POBA.  EF is 40-45 by echocardiogram. She has received 2 doses of IV Furosemide with evidence of interstitial edema on CXR.    Assessment & Plan    1. NSTEMI She is doing well without chest pain or significant shortness of breath.  We discussed her procedure, medications and plans for follow up.  She should be able to be DC today once seen by the attending MD.    -Continue ASA, Ticagrelor, Atorvastatin, Losartan, Metoprolol succinate  -TCM follow up in 1-2 weeks  2. Combined systolic and diastolic CHF Ischemic CM.  EF 40-45.  NYHA 2.  Volume stable.  Continue beta-blocker, angiotensin receptor blocker.  BP is little too soft to start Spironolactone.  This could be considered at FU.    3. Hyperlipidemia  Continue high intensity statin.  She will need follow up Lipids and LFTs in 8-12 weeks.    For questions or updates, please contact [place] Please consult  [email or web address] for contact info under       Signed, Richardson Dopp, PA-C  10/15/2019, 7:41 AM     Patient seen and discussed with PA Kathlen Mody, I agree with his documentation. Admitted with NSTEMI, 99% mid LAD stenosis on cath, received DES. Echo LVEF 40-45%, apical akinesis, grade II DDx. Medical therapy with ASA 81, atorva 80, losartan 25, toprol 25, brillinta 90mg  bid. Soft bp's limited titration at this time. Feeling well this morning, no complaints. We will plan for discharge today and arrange f/u   Carlyle Dolly MD

## 2019-10-15 NOTE — Progress Notes (Signed)
CARDIAC REHAB PHASE I   PRE:  Rate/Rhythm: 80 SR  BP:  Supine: 105/48  Sitting:   Standing:    SaO2: 95%RA  MODE:  Ambulation: 400 ft   POST:  Rate/Rhythm: 96 SR  BP:  Supine:   Sitting: 119/66  Standing:    SaO2: 98%RA [id number] Pt walked 400 ft on RA with steady gait and tolerated well. Stated felt easier today. Answered questions from daughter re recovery.   Graylon Good, RN BSN  10/15/2019 8:30 AM

## 2019-10-15 NOTE — Discharge Summary (Addendum)
Discharge Summary    Patient ID: Valerie Fields MRN: [id number]; DOB: 08/30/1950  Admit date: 10/13/2019 Discharge date: 10/15/2019  Primary Care Provider: Marda Stalker, PA-C  Primary Cardiologist: Lauree Chandler, MD   Primary Electrophysiologist:  None    Discharge Diagnoses    Principal Problem:   Non-ST elevation (NSTEMI) myocardial infarction [facility]) Active Problems:   Chronic combined systolic and diastolic CHF (congestive heart failure) ([place])   Ischemic cardiomyopathy   Hyperlipidemia   GERD (gastroesophageal reflux disease)   Diagnostic Studies/Procedures     ECHOCARDIOGRAM COMPLETE  10/13/2019 IMPRESSIONS   1. Left ventricular ejection fraction, by estimation, is 40 to 45%. The left ventricle has mildly decreased function. The left ventricle demonstrates regional wall motion abnormalities (see scoring diagram/findings for description). Mid to apical anterior/anteroseptal akinesis. Left ventricular diastolic parameters are consistent with Grade II diastolic dysfunction (pseudonormalization). Elevated left atrial pressure.   2. Right ventricular systolic function is normal. The right ventricular size is normal. There is normal pulmonary artery systolic pressure. The estimated right ventricular systolic pressure is 71.0 mmHg.   3. The mitral valve is normal in structure. Trivial mitral valve regurgitation.   4. The aortic valve is tricuspid. Aortic valve regurgitation is not visualized. No aortic stenosis is present.   5. The inferior vena cava is normal in size with greater than 50% respiratory variability, suggesting right atrial pressure of 3 mmHg.   CARDIAC CATHETERIZATION 10/13/19 LM absent LAD ost 30, prox 99 thrombotic; D1 90 thrombotic LCx normal RCA prox 15, mid 15 EF ~35 PCI: STENT RESOLUTE ONYX DES 2.75X22 to pLAD PCI: POBA to D1  Conclusions: 1. Severe single-vessel CAD with 99% stenosis of mid LAD involving D1 with TIMI-1 flow. 2. Moderately  to severely reduced left ventricular systolic function with mid and apical anterior hypokinesis/akinesis.  LVEF ~35%. 3. Mildly elevated left ventricular filling pressure (LVEDP 20-25 mmHg). 4. Successful PCI to mid LAD/D1 using Resolute Onyx 2.75 x 22 mm drug-eluting stent and kissing balloon inflation in LAD/D1 with 0% residual stenosis and TIMI-3 flow.    _____________   History of Present Illness     Valerie Fields is a 69 y.o. female with GERD, asthma who was presented to [facility] with chest pain.  She had stuttering chest pain for 2 days.  On the day of admission, she had sudden onset severe chest pain that awoke her from sleep.  Her initial hs-Trop was 92 and she had inferior and anterolateral ST depression as well as subtle lateral ST elevation on ECG.  she was brought urgent to the cardiac catheterization lab for further management of her non ST-elevation myocardial infarction.     Hospital Course     Consultants: None    1. NSTEMI Peak hs-Trop was >27K.  Cardiac catheterization demonstrated severe single vessel CAD with high grade mid LAD stenosis involving the D1.  The LAD lesion was treated with a DES and the D1 was treated with POBA.  She has done well since her PCI without recurrent chest pain.  She was seen by Dr. Harl Bowie this morning and is felt to be stable for DC to home.  She will need to remain on dual antiplatelet Rx for 1 year.  We discussed the importance of this.  Continue ASA, Ticagrelor, Atorvastatin, Losartan, Metoprolol succinate.  She will need TCM follow up in 1-2 weeks  2. Combined systolic and diastolic CHF Left ventriculogram during cardiac catheterization demonstrated EF ~35%.  Her Echocardiogram this admission demonstrated EF 40-45%  and mod diastolic dysfunction.  CHF is secondary to an ischemic cardiomyopathy.  LVEDP was 20-25 at cardiac catheterization.  She did require 2 doses of IV Furosemide post cath.  Since then, she has done well without evidence  of volume overload.  She was started on beta-blocker, angiotensin receptor blocker.  Spironolactone was not started due to low BP.  This can be considered at follow up.   3. Hyperlipidemia  She was started on high intensity statin with Atorvastatin 80 mg once daily.  She was previously on Ezetimibe prior to admission.  This was DC'd.  She will need follow up Lipids and LFTs in 8-12 weeks.    Did the patient have an acute coronary syndrome (MI, NSTEMI, STEMI, etc) this admission?:  Yes                               AHA/ACC Clinical Performance & Quality Measures: 1. Aspirin prescribed? - Yes 2. ADP Receptor Inhibitor (Plavix/Clopidogrel, Brilinta/Ticagrelor or Effient/Prasugrel) prescribed (includes medically managed patients)? - Yes 3. Beta Blocker prescribed? - Yes 4. High Intensity Statin (Lipitor 40-80mg  or Crestor 20-40mg ) prescribed? - Yes 5. EF assessed during THIS hospitalization? - Yes 6. For EF <40%, was ACEI/ARB prescribed? - Yes 7. For EF <40%, Aldosterone Antagonist (Spironolactone or Eplerenone) prescribed? - Not Applicable (EF >/= 78%) BP soft (consider at f/u) 8. Cardiac Rehab Phase II ordered (including medically managed patients)? - Yes   _____________  Discharge Vitals Blood pressure (!) 105/48, pulse 80, temperature 98.4 F (36.9 C), temperature source Oral, resp. rate 17, height 5\' 2"  (1.575 m), weight 97.8 kg, SpO2 100 %.  Filed Weights   10/13/19 0443 10/14/19 0600 10/15/19 0513  Weight: 93.9 kg 98.3 kg 97.8 kg    Labs & Radiologic Studies    CBC Recent Labs    10/13/19 0422 10/14/19 0850  WBC 9.7 15.2*  HGB 14.7 15.7*  HCT 44.6 46.2*  MCV 95.5 92.8  PLT 301 242   Basic Metabolic Panel Recent Labs    10/13/19 0427 10/14/19 0253  NA 140 136  K 3.6 3.6  CL 107 101  CO2 21* 22  GLUCOSE 165* 118*  BUN 12 8  CREATININE 0.79 0.79  CALCIUM 8.9 9.6   Liver Function Tests Recent Labs    10/13/19 0427  AST 25  ALT 29  ALKPHOS 57  BILITOT 0.8    PROT 6.4*  ALBUMIN 3.4*   Recent Labs    10/13/19 0427  LIPASE 30   High Sensitivity Troponin:   Recent Labs  Lab 10/13/19 0422 10/13/19 0643 10/13/19 1502 10/13/19 2157 10/14/19 0554  TROPONINIHS 92* 216* >27,000* >27,000* >27,000*     Hemoglobin A1C Recent Labs    10/13/19 1502  HGBA1C 5.6   Fasting Lipid Panel Recent Labs    10/13/19 1502  CHOL 176  HDL 48  LDLCALC 96  TRIG 161*  CHOLHDL 3.7    _____________   DG Chest Portable 1 View   Result Date: 10/13/2019 CLINICAL DATA:  Chest pain EXAM: PORTABLE CHEST 1 VIEW COMPARISON:  None. FINDINGS: Artifact from EKG leads. Interstitial prominence with possibility of a few Kerley lines. Normal heart size for technique. Normal hilar and aortic contours. No visible effusion or pneumothorax.  IMPRESSION: Borderline interstitial edema Electronically Signed    By: Monte Fantasia M.D.   On: 10/13/2019 05:07       Disposition   Pt is being discharged  home today in good condition.  Follow-up Plans & Appointments     Follow-up Information    Burnell Blanks, MD Follow up in 1 week(s).   Specialty: Cardiology Why: You will see Dr. Angelena Form or one of the PAs/NPs on his team.  The office will call you to arrange an appt. Contact information: [address] [place] [address] [phone]        Marda Stalker, PA-C Follow up in 2 week(s).   Specialty: Family Medicine Why: Please call for follow up in the next 2-3 weeks Contact information: [address] [place] [address] [phone]              Discharge Instructions    ([place]) Call MD:  Anytime you have any of the following symptoms: 1) 3 pound weight gain in 24 hours or 5 pounds in 1 week 2) shortness of breath, with or without a dry hacking cough 3) swelling in the hands, feet or stomach 4) if you have to sleep on extra pillows at night in order to breathe.   Complete by: As directed    Amb  Referral to Cardiac Rehabilitation   Complete by: As directed    Diagnosis:  Coronary Stents NSTEMI     After initial evaluation and assessments completed: Virtual Based Care may be provided alone or in conjunction with Phase 2 Cardiac Rehab based on patient barriers.: Yes   Diet - low sodium heart healthy   Complete by: As directed    Discharge instructions   Complete by: As directed    See written instructions   Discharge wound care:   Complete by: As directed    Call [phone] for any swelling, bleeding, bruising or fever.   Driving Restrictions   Complete by: As directed    No driving until seen at follow up appt.   Increase activity slowly   Complete by: As directed       Discharge Medications   Allergies as of 10/15/2019      Reactions   Soy Allergy Hives   Sulfa Antibiotics Other (See Comments)   As a child, turned red above neck   Eggs Or Egg-derived Products Other (See Comments)   Childhood allergy      Medication List    STOP taking these medications   ciprofloxacin 500 MG tablet Commonly known as: CIPRO   ezetimibe 10 MG tablet Commonly known as: ZETIA   ibuprofen 200 MG tablet Commonly known as: ADVIL   meloxicam 15 MG tablet Commonly known as: MOBIC     TAKE these medications   acetaminophen 325 MG tablet Commonly known as: TYLENOL Take 2 tablets (650 mg total) by mouth every 4 (four) hours as needed for mild pain, moderate pain or headache.   aspirin 81 MG chewable tablet Chew 1 tablet (81 mg total) by mouth daily.   atorvastatin 80 MG tablet Commonly known as: LIPITOR Take 1 tablet (80 mg total) by mouth daily.   azelastine 0.1 % nasal spray Commonly known as: ASTELIN Place 1 spray 2 (two) times daily into both nostrils. Use in each nostril as directed   Calcium-Magnesium-Zinc Tabs Take 1-2 tablets by mouth daily.   cetirizine 10 MG tablet Commonly known as: ZYRTEC Take 10 mg by mouth daily.   cholecalciferol 1000 units  tablet Commonly known as: VITAMIN D Take 2,000 Units daily by mouth.   CO Q 10 PO Take 1 capsule by mouth daily.  dicyclomine 10 MG capsule Commonly known as: BENTYL Take 1 tablet by mouth every 6 hours as needed. What changed:   how much to take  how to take this  when to take this  reasons to take this  additional instructions   losartan 25 MG tablet Commonly known as: COZAAR Take 1 tablet (25 mg total) by mouth daily.   metoCLOPramide 10 MG tablet Commonly known as: REGLAN Take 10 mg by mouth 4 (four) times daily.   metoprolol succinate 25 MG 24 hr tablet Commonly known as: TOPROL-XL Take 1 tablet (25 mg total) by mouth daily.   montelukast 10 MG tablet Commonly known as: SINGULAIR Take 10 mg by mouth at bedtime.   nitroGLYCERIN 0.4 MG SL tablet Commonly known as: NITROSTAT Place 1 tablet (0.4 mg total) under the tongue every 5 (five) minutes as needed for chest pain.   psyllium 0.52 g capsule Commonly known as: REGULOID Take 1.5 g by mouth daily.   Stool Softener 100 MG capsule Generic drug: docusate sodium Take 100 mg by mouth daily.   ticagrelor 90 MG Tabs tablet Commonly known as: BRILINTA Take 1 tablet (90 mg total) by mouth 2 (two) times daily.            Discharge Care Instructions  (From admission, onward)         Start     Ordered   10/15/19 0000  Discharge wound care:       Comments: Call [phone] for any swelling, bleeding, bruising or fever.   10/15/19 0951             Outstanding Labs/Studies   1. Patient will need fasting Lipids and LFTs 8-12 weeks (this can be scheduled at f/u visit).  Duration of Discharge Encounter   Greater than 30 minutes including physician time.  Signed, Richardson Dopp, PA-C 10/15/2019, 9:54 AM

## 2019-10-24 NOTE — Progress Notes (Signed)
Cardiology Office Note    Date:  10/25/2019   ID:  Valerie, Fields [birth year], MRN [id number]  PCP:  Marda Stalker, PA-C  Cardiologist: Lauree Chandler, MD EPS: None  Chief Complaint  Patient presents with  . Hospitalization Follow-up    History of Present Illness:  Valerie Fields is a 69 y.o. female with history of GERD and asthma who presented with stuttering chest pain for 2 days/NSTEMI 10/13/19 and underwent DES to the high-grade mid LAD involving diagonal 1 treated with POBA.  EF 35% at cath, 40 to 45% with moderate diastolic dysfunction on echo.  She received 2 doses of IV Lasix post cath.  Discharged on aspirin, Brilinta, atorvastatin, losartan, metoprolol.  Spironolactone was not started due to low BP can be considered as outpatient.  Patient was taking Reglan after she went home and it made her feel bad and BP went up. Once she stopped it she felt better and BP came down. No chest pain, dyspnea, dizziness or presyncope.     Past Medical History:  Diagnosis Date  . Allergy   . Asthma   . Chronic combined systolic and diastolic CHF 95/62/1308   Ischemic CM // Echocardiogram 7/21: EF 40-45, Gr 2 DD, mid to apical ant/ant-sept AK, RVSP 23.4, trivial MR  . Coronary artery disease    NSTEMI 7/21: oLAD 30, pLAD 99 >> PCI: DES; D1 90 >> PCI: POBA, prox and mid RCA 15, EF 35  . Diverticulitis   . GERD (gastroesophageal reflux disease)   . Hyperlipidemia 10/15/2019   Zetia DC'd during admit for NSTEMI 7/21 >> ? to Atorvastatin  . Ischemic cardiomyopathy 10/15/2019  . Non-ST elevation (NSTEMI) MI 10/13/2019   PCI:  DES to LAD and POBA to D1    Past Surgical History:  Procedure Laterality Date  . BREAST BIOPSY Left    No Scar seen   . BREAST CYST EXCISION Left    No scar seen   . COLONOSCOPY    . CORONARY STENT INTERVENTION  10/13/2019  . CORONARY STENT INTERVENTION N/A 10/13/2019   Procedure: CORONARY STENT INTERVENTION;  Surgeon: Nelva Bush,  MD;  Location: [place] [facility];  Service: Cardiovascular;  Laterality: N/A;  mid lad  . LEFT HEART CATH AND CORONARY ANGIOGRAPHY N/A 10/13/2019   Procedure: LEFT HEART CATH AND CORONARY ANGIOGRAPHY;  Surgeon: Nelva Bush, MD;  Location: [place] [facility];  Service: Cardiovascular;  Laterality: N/A;  . TONSILLECTOMY      Current Medications: Current Meds  Medication Sig  . acetaminophen (TYLENOL) 325 MG tablet Take 2 tablets (650 mg total) by mouth every 4 (four) hours as needed for mild pain, moderate pain or headache.  Marland Kitchen aspirin 81 MG chewable tablet Chew 1 tablet (81 mg total) by mouth daily.  Marland Kitchen atorvastatin (LIPITOR) 80 MG tablet Take 1 tablet (80 mg total) by mouth daily.  Marland Kitchen azelastine (ASTELIN) 0.1 % nasal spray Place 1 spray 2 (two) times daily into both nostrils. Use in each nostril as directed  . cetirizine (ZYRTEC) 10 MG tablet Take 10 mg by mouth daily.  . cholecalciferol (VITAMIN D) 1000 units tablet Take 2,000 Units daily by mouth.  . Coenzyme Q10 (CO Q 10 PO) Take 1 capsule by mouth daily.  Marland Kitchen dicyclomine (BENTYL) 10 MG capsule Take 1 tablet by mouth every 6 hours as needed. (Patient taking differently: Take 10 mg by mouth every 6 (six) hours as needed for spasms. )  . docusate sodium (STOOL SOFTENER) 100  MG capsule Take 100 mg by mouth daily.   Marland Kitchen losartan (COZAAR) 25 MG tablet Take 1 tablet (25 mg total) by mouth daily.  . metoprolol succinate (TOPROL-XL) 25 MG 24 hr tablet Take 1 tablet (25 mg total) by mouth daily.  . montelukast (SINGULAIR) 10 MG tablet Take 10 mg by mouth at bedtime.   . Multiple Minerals (CALCIUM-MAGNESIUM-ZINC) TABS Take 1-2 tablets by mouth daily.  . nitroGLYCERIN (NITROSTAT) 0.4 MG SL tablet Place 1 tablet (0.4 mg total) under the tongue every 5 (five) minutes as needed for chest pain.  Marland Kitchen psyllium (REGULOID) 0.52 g capsule Take 1.5 g by mouth daily.  . ticagrelor (BRILINTA) 90 MG TABS tablet Take 1 tablet (90 mg total) by mouth 2 (two) times  daily.  . [DISCONTINUED] ticagrelor (BRILINTA) 90 MG TABS tablet Take 1 tablet (90 mg total) by mouth 2 (two) times daily.     Allergies:   Soy allergy, Sulfa antibiotics, and Eggs or egg-derived products   Social History   Socioeconomic History  . Marital status: Divorced    Spouse name: Not on file  . Number of children: 2  . Years of education: Not on file  . Highest education level: Not on file  Occupational History  . Occupation: Retired  Tobacco Use  . Smoking status: Never Smoker  . Smokeless tobacco: Never Used  Vaping Use  . Vaping Use: Never used  Substance and Sexual Activity  . Alcohol use: Yes    Alcohol/week: 1.0 standard drink    Types: 1 Glasses of wine per week  . Drug use: No  . Sexual activity: Not Currently  Other Topics Concern  . Not on file  Social History Narrative  . Not on file   Social Determinants of Health   Financial Resource Strain:   . Difficulty of Paying Living Expenses:   Food Insecurity:   . Worried About Charity fundraiser in the Last Year:   . Arboriculturist in the Last Year:   Transportation Needs:   . Film/video editor (Medical):   Marland Kitchen Lack of Transportation (Non-Medical):   Physical Activity:   . Days of Exercise per Week:   . Minutes of Exercise per Session:   Stress:   . Feeling of Stress :   Social Connections:   . Frequency of Communication with Friends and Family:   . Frequency of Social Gatherings with Friends and Family:   . Attends Religious Services:   . Active Member of Clubs or Organizations:   . Attends Archivist Meetings:   Marland Kitchen Marital Status:      Family History:  The patient's family history includes Aneurysm in her mother; GER disease in her father; Stomach cancer in her maternal grandfather.   ROS:   Please see the history of present illness.    ROS All other systems reviewed and are negative.   PHYSICAL EXAM:   VS:  BP (!) 100/56   Pulse 74   Ht 5\' 2"  (1.575 m)   Wt (!) 214 lb  (97.1 kg)   SpO2 98%   BMI 39.14 kg/m   Physical Exam  GEN: Obese, in no acute distress  Neck: no JVD, carotid bruits, or masses Cardiac:RRR; no murmurs, rubs, or gallops  Respiratory:  clear to auscultation bilaterally, normal work of breathing GI: soft, nontender, nondistended, + BS Ext: right arm without hematoma at cath site. Good radial and brachial pulses. Lower ext without cyanosis, clubbing, or edema, Good  distal pulses bilaterally Neuro:  Alert and Oriented x 3 Psych: euthymic mood, full affect  Wt Readings from Last 3 Encounters:  10/25/19 (!) 214 lb (97.1 kg)  10/15/19 215 lb 11.2 oz (97.8 kg)  02/03/19 206 lb (93.4 kg)      Studies/Labs Reviewed:   EKG:  EKG is not ordered today.   Recent Labs: 10/13/2019: ALT 29 10/14/2019: BUN 8; Creatinine, Ser 0.79; Hemoglobin 15.7; Platelets 332; Potassium 3.6; Sodium 136   Lipid Panel    Component Value Date/Time   CHOL 176 10/13/2019 1502   TRIG 161 (H) 10/13/2019 1502   HDL 48 10/13/2019 1502   CHOLHDL 3.7 10/13/2019 1502   VLDL 32 10/13/2019 1502   LDLCALC 96 10/13/2019 1502    Additional studies/ records that were reviewed today include:  ECHOCARDIOGRAM COMPLETE  10/13/2019 IMPRESSIONS   1. Left ventricular ejection fraction, by estimation, is 40 to 45%. The left ventricle has mildly decreased function. The left ventricle demonstrates regional wall motion abnormalities (see scoring diagram/findings for description). Mid to apical anterior/anteroseptal akinesis. Left ventricular diastolic parameters are consistent with Grade II diastolic dysfunction (pseudonormalization). Elevated left atrial pressure.   2. Right ventricular systolic function is normal. The right ventricular size is normal. There is normal pulmonary artery systolic pressure. The estimated right ventricular systolic pressure is 81.1 mmHg.   3. The mitral valve is normal in structure. Trivial mitral valve regurgitation.   4. The aortic valve is tricuspid.  Aortic valve regurgitation is not visualized. No aortic stenosis is present.   5. The inferior vena cava is normal in size with greater than 50% respiratory variability, suggesting right atrial pressure of 3 mmHg.    CARDIAC CATHETERIZATION 10/13/19 LM absent LAD ost 30, prox 99 thrombotic; D1 90 thrombotic LCx normal RCA prox 15, mid 15 EF ~35 PCI: STENT RESOLUTE ONYX DES 2.75X22 to pLAD PCI: POBA to D1  Conclusions: 1. Severe single-vessel CAD with 99% stenosis of mid LAD involving D1 with TIMI-1 flow. 2. Moderately to severely reduced left ventricular systolic function with mid and apical anterior hypokinesis/akinesis.  LVEF ~35%. 3. Mildly elevated left ventricular filling pressure (LVEDP 20-25 mmHg). 4. Successful PCI to mid LAD/D1 using Resolute Onyx 2.75 x 22 mm drug-eluting stent and kissing balloon inflation in LAD/D1 with 0% residual stenosis and TIMI-3 flow.     ASSESSMENT:    1. Coronary artery disease involving native coronary artery of native heart without angina pectoris   2. Ischemic cardiomyopathy   3. Chronic combined systolic and diastolic CHF (congestive heart failure) ([place])   4. Hyperlipidemia, unspecified hyperlipidemia type   5. Class 2 severe obesity due to excess calories with serious comorbidity and body mass index (BMI) of 39.0 to 39.9 in adult ([place])   6. Atherosclerosis      PLAN:  In order of problems listed above:  CAD status post NSTEMI treated with DES to the mid LAD and POBA to the D1 on Brilinta, ASA, and lipitor. Patient's mother had carotid stenosis and she'd like her carotids checked. Will schedule.  Ischemic cardiomyopathy ejection fraction 35% at cath 40 to 45% on echo with moderate diastolic dysfunction on beta-blocker, angiotensin receptor blocker, blood pressure too low to consider spironolactone at this time.  Follow-up echo in 3 months.  Chronic systolic and diastolic CHF-no CHF on exam, weighing herself daily.  2 g sodium  diet  Hyperlipidemia started on atorvastatin 80 mg once daily.  Will need repeat lipids and LFTs in 8 to 12 weeks  Obesity exercise and weight loss essential to overall health.  Plans to do cardiac rehab.   Medication Adjustments/Labs and Tests Ordered: Current medicines are reviewed at length with the patient today.  Concerns regarding medicines are outlined above.  Medication changes, Labs and Tests ordered today are listed in the Patient Instructions below. Patient Instructions   Medication Instructions:  Your physician recommends that you continue on your current medications as directed. Please refer to the Current Medication list given to you today.  *If you need a refill on your cardiac medications before your next appointment, please call your pharmacy*   Lab Work: FLP, LFT's  Your physician recommends that you return for a FASTING lipid profile. This has been scheduled for 01/25/2020  If you have labs (blood work) drawn today and your tests are completely normal, you will receive your results only by: Marland Kitchen MyChart Message (if you have MyChart) OR . A paper copy in the mail If you have any lab test that is abnormal or we need to change your treatment, we will call you to review the results.   Testing/Procedures: Your physician has requested that you have an echocardiogram. Echocardiography is a painless test that uses sound waves to create images of your heart. It provides your doctor with information about the size and shape of your heart and how well your heart's chambers and valves are working. This procedure takes approximately one hour. There are no restrictions for this procedure.     Follow-Up: At [facility], you and your health needs are our priority.  As part of our continuing mission to provide you with exceptional heart care, we have created designated Provider Care Teams.  These Care Teams include your primary Cardiologist (physician) and Advanced Practice  Providers (APPs -  Physician Assistants and Nurse Practitioners) who all work together to provide you with the care you need, when you need it.  We recommend signing up for the patient portal called "MyChart".  Sign up information is provided on this After Visit Summary.  MyChart is used to connect with patients for Virtual Visits (Telemedicine).  Patients are able to view lab/test results, encounter notes, upcoming appointments, etc.  Non-urgent messages can be sent to your provider as well.   To learn more about what you can do with MyChart, go to [email or web address].    Your next appointment:   12/28/2019 with Ermalinda Barrios, PA 02/13/2020 with Dr Angelena Form  The format for your next appointment:   In Person   Other Instructions Two Gram Sodium Diet 2000 mg  What is Sodium? Sodium is a mineral found naturally in many foods. The most significant source of sodium in the diet is table salt, which is about 40% sodium.  Processed, convenience, and preserved foods also contain a large amount of sodium.  The body needs only 500 mg of sodium daily to function,  A normal diet provides more than enough sodium even if you do not use salt.  Why Limit Sodium? A build up of sodium in the body can cause thirst, increased blood pressure, shortness of breath, and water retention.  Decreasing sodium in the diet can reduce edema and risk of heart attack or stroke associated with high blood pressure.  Keep in mind that there are many other factors involved in these health problems.  Heredity, obesity, lack of exercise, cigarette smoking, stress and what you eat all play a role.  General Guidelines:  Do not add salt at the table or in cooking.  One teaspoon of salt contains over 2 grams of sodium.  Read food labels  Avoid processed and convenience foods  Ask your dietitian before eating any foods not dicussed in the menu planning guidelines  Consult your physician if you wish to use a salt substitute or  a sodium containing medication such as antacids.  Limit milk and milk products to 16 oz (2 cups) per day.  Shopping Hints:  READ LABELS!! "Dietetic" does not necessarily mean low sodium.  Salt and other sodium ingredients are often added to foods during processing.   Menu Planning Guidelines Food Group Choose More Often Avoid  Beverages (see also the milk group All fruit juices, low-sodium, salt-free vegetables juices, low-sodium carbonated beverages Regular vegetable or tomato juices, commercially softened water used for drinking or cooking  Breads and Cereals Enriched white, wheat, rye and pumpernickel bread, hard rolls and dinner rolls; muffins, cornbread and waffles; most dry cereals, cooked cereal without added salt; unsalted crackers and breadsticks; low sodium or homemade bread crumbs Bread, rolls and crackers with salted tops; quick breads; instant hot cereals; pancakes; commercial bread stuffing; self-rising flower and biscuit mixes; regular bread crumbs or cracker crumbs  Desserts and Sweets Desserts and sweets mad with mild should be within allowance Instant pudding mixes and cake mixes  Fats Butter or margarine; vegetable oils; unsalted salad dressings, regular salad dressings limited to 1 Tbs; light, sour and heavy cream Regular salad dressings containing bacon fat, bacon bits, and salt pork; snack dips made with instant soup mixes or processed cheese; salted nuts  Fruits Most fresh, frozen and canned fruits Fruits processed with salt or sodium-containing ingredient (some dried fruits are processed with sodium sulfites        Vegetables Fresh, frozen vegetables and low- sodium canned vegetables Regular canned vegetables, sauerkraut, pickled vegetables, and others prepared in brine; frozen vegetables in sauces; vegetables seasoned with ham, bacon or salt pork  Condiments, Sauces, Miscellaneous  Salt substitute with physician's approval; pepper, herbs, spices; vinegar, lemon or  lime juice; hot pepper sauce; garlic powder, onion powder, low sodium soy sauce (1 Tbs.); low sodium condiments (ketchup, chili sauce, mustard) in limited amounts (1 tsp.) fresh ground horseradish; unsalted tortilla chips, pretzels, potato chips, popcorn, salsa (1/4 cup) Any seasoning made with salt including garlic salt, celery salt, onion salt, and seasoned salt; sea salt, rock salt, kosher salt; meat tenderizers; monosodium glutamate; mustard, regular soy sauce, barbecue, sauce, chili sauce, teriyaki sauce, steak sauce, Worcestershire sauce, and most flavored vinegars; canned gravy and mixes; regular condiments; salted snack foods, olives, picles, relish, horseradish sauce, catsup   Food preparation: Try these seasonings Meats:    Pork Sage, onion Serve with applesauce  Chicken Poultry seasoning, thyme, parsley Serve with cranberry sauce  Lamb Curry powder, rosemary, garlic, thyme Serve with mint sauce or jelly  Veal Marjoram, basil Serve with current jelly, cranberry sauce  Beef Pepper, bay leaf Serve with dry mustard, unsalted chive butter  Fish [place] leaf, dill Serve with unsalted lemon butter, unsalted parsley butter  Vegetables:    Asparagus Lemon juice   Broccoli Lemon juice   Carrots Mustard dressing parsley, mint, nutmeg, glazed with unsalted butter and sugar   Green beans Marjoram, lemon juice, nutmeg,dill seed   Tomatoes Basil, marjoram, onion   Spice /blend for [employer]" 4 tsp ground thyme 1 tsp ground sage 3 tsp ground rosemary 4 tsp ground marjoram   Test your knowledge 1. A product that says "Salt Free" may still contain sodium. True  or False 2. Garlic Powder and Hot Pepper Sauce an be used as alternative seasonings.True or False 3. Processed foods have more sodium than fresh foods.  True or False 4. Canned Vegetables have less sodium than froze True or False  WAYS TO DECREASE YOUR SODIUM INTAKE 1. Avoid the use of added salt in cooking and at the table.  Table salt (and  other prepared seasonings which contain salt) is probably one of the greatest sources of sodium in the diet.  Unsalted foods can gain flavor from the sweet, sour, and butter taste sensations of herbs and spices.  Instead of using salt for seasoning, try the following seasonings with the foods listed.  Remember: how you use them to enhance natural food flavors is limited only by your creativity... Allspice-Meat, fish, eggs, fruit, peas, red and yellow vegetables Almond Extract-Fruit baked goods Anise Seed-Sweet breads, fruit, carrots, beets, cottage cheese, cookies (tastes like licorice) Basil-Meat, fish, eggs, vegetables, rice, vegetables salads, soups, sauces Bay Leaf-Meat, fish, stews, poultry Burnet-Salad, vegetables (cucumber-like flavor) Caraway Seed-Bread, cookies, cottage cheese, meat, vegetables, cheese, rice Cardamon-Baked goods, fruit, soups Celery Powder or seed-Salads, salad dressings, sauces, meatloaf, soup, bread.Do not use  celery salt Chervil-Meats, salads, fish, eggs, vegetables, cottage cheese (parsley-like flavor) Chili Power-Meatloaf, chicken cheese, corn, eggplant, egg dishes Chives-Salads cottage cheese, egg dishes, soups, vegetables, sauces Cilantro-Salsa, casseroles Cinnamon-Baked goods, fruit, pork, lamb, chicken, carrots Cloves-Fruit, baked goods, fish, pot roast, green beans, beets, carrots Coriander-Pastry, cookies, meat, salads, cheese (lemon-orange flavor) Cumin-Meatloaf, fish,cheese, eggs, cabbage,fruit pie (caraway flavor) [employer], fruit, eggs, fish, poultry, cottage cheese, vegetables Dill Seed-Meat, cottage cheese, poultry, vegetables, fish, salads, bread Fennel Seed-Bread, cookies, apples, pork, eggs, fish, beets, cabbage, cheese, Licorice-like flavor Garlic-(buds or powder) Salads, meat, poultry, fish, bread, butter, vegetables, potatoes.Do not  use garlic salt Ginger-Fruit, vegetables, baked goods, meat, fish, poultry Horseradish Root-Meet,  vegetables, butter Lemon Juice or Extract-Vegetables, fruit, tea, baked goods, fish salads Mace-Baked goods fruit, vegetables, fish, poultry (taste like nutmeg) Maple Extract-Syrups Marjoram-Meat, chicken, fish, vegetables, breads, green salads (taste like Sage) Mint-Tea, lamb, sherbet, vegetables, desserts, carrots, cabbage Mustard, Dry or Seed-Cheese, eggs, meats, vegetables, poultry Nutmeg-Baked goods, fruit, chicken, eggs, vegetables, desserts Onion Powder-Meat, fish, poultry, vegetables, cheese, eggs, bread, rice salads (Do not use   Onion salt) Orange Extract-Desserts, baked goods Oregano-Pasta, eggs, cheese, onions, pork, lamb, fish, chicken, vegetables, green salads Paprika-Meat, fish, poultry, eggs, cheese, vegetables Parsley Flakes-Butter, vegetables, meat fish, poultry, eggs, bread, salads (certain forms may   Contain sodium Pepper-Meat fish, poultry, vegetables, eggs Peppermint Extract-Desserts, baked goods Poppy Seed-Eggs, bread, cheese, fruit dressings, baked goods, noodles, vegetables, cottage  [employer], poultry, meat, fish, cauliflower, turnips,eggs bread Saffron-Rice, bread, veal, chicken, fish, eggs Sage-Meat, fish, poultry, onions, eggplant, tomateos, pork, stews Savory-Eggs, salads, poultry, meat, rice, vegetables, soups, pork Tarragon-Meat, poultry, fish, eggs, butter, vegetables (licorice-like flavor)  Thyme-Meat, poultry, fish, eggs, vegetables, (clover-like flavor), sauces, soups Tumeric-Salads, butter, eggs, fish, rice, vegetables (saffron-like flavor) Vanilla Extract-Baked goods, candy Vinegar-Salads, vegetables, meat marinades Walnut Extract-baked goods, candy  2. Choose your Foods Wisely   The following is a list of foods to avoid which are high in sodium:  Meats-Avoid all smoked, canned, salt cured, dried and kosher meat and fish as well as Anchovies   [employer] meats:Bologna, Liverwurst,  Pastrami Canned meat or fish  Marinated herring Caviar    Pepperoni Corned Beef   Pizza Dried chipped beef  Salami Frozen breaded fish or meat Salt pork Frankfurters  or hot dogs  Sardines Gefilte fish   Sausage Ham (boiled ham, Proscuitto Smoked butt    spiced ham)   Spam      TV Dinners Vegetables Canned vegetables (Regular) Relish Canned mushrooms  Sauerkraut Olives    Tomato juice Pickles  Bakery and Dessert Products Canned puddings  Cream pies Cheesecake   Decorated cakes Cookies  Beverages/Juices Tomato juice, regular  Gatorade   V-8 vegetable juice, regular  Breads and Cereals Biscuit mixes   Salted potato chips, corn chips, pretzels Bread stuffing mixes  Salted crackers and rolls Pancake and waffle mixes Self-rising flour  Seasonings Accent    Meat sauces Barbecue sauce  Meat tenderizer Catsup    Monosodium glutamate (MSG) Celery salt   Onion salt Chili sauce   Prepared mustard Garlic salt   Salt, seasoned salt, sea salt Gravy mixes   Soy sauce Horseradish   Steak sauce Ketchup   Tartar sauce Lite salt    Teriyaki sauce Marinade mixes   Worcestershire sauce  Others Baking powder   Cocoa and cocoa mixes Baking soda   Commercial casserole mixes Candy-caramels, chocolate  Dehydrated soups    Bars, fudge,nougats  Instant rice and pasta mixes Canned broth or soup  Maraschino cherries Cheese, aged and processed cheese and cheese spreads  Learning Assessment Quiz  Indicated T (for True) or F (for False) for each of the following statements:  1. _____ Fresh fruits and vegetables and unprocessed grains are generally low in sodium 2. _____ Water may contain a considerable amount of sodium, depending on the source 3. _____ You can always tell if a food is high in sodium by tasting it 4. _____ Certain laxatives my be high in sodium and should be avoided unless prescribed   by a physician or pharmacist 5. _____ Salt substitutes may be used freely by anyone on a  sodium restricted diet 6. _____ Sodium is present in table salt, food additives and as a natural component of   most foods 7. _____ Table salt is approximately 90% sodium 8. _____ Limiting sodium intake may help prevent excess fluid accumulation in the body 9. _____ On a sodium-restricted diet, seasonings such as bouillon soy sauce, and    cooking wine should be used in place of table salt 10. _____ On an ingredient list, a product which lists monosodium glutamate as the first   ingredient is an appropriate food to include on a low sodium diet  Circle the best answer(s) to the following statements (Hint: there may be more than one correct answer)  11. On a low-sodium diet, some acceptable snack items are:    A. Olives  F. Bean dip   K. Grapefruit juice    B. Salted Pretzels G. Commercial Popcorn   L. Canned peaches    C. Carrot Sticks  H. Bouillon   M. Unsalted nuts   D. [place] fries  I. Peanut butter crackers N. Salami   E. Sweet pickles J. Tomato Juice   O. Pizza  12.  Seasonings that may be used freely on a reduced - sodium diet include   A. Lemon wedges F.Monosodium glutamate K. Celery seed    B.Soysauce   G. Pepper   L. Mustard powder   C. Sea salt  H. Cooking wine  M. Onion flakes   D. Vinegar  E. Prepared horseradish N. Salsa   E. Sage   J. Worcestershire sauce  O. Chutney      Signed, Ermalinda Barrios, PA-C  10/25/2019 2:44 PM    [address], [place], [place]  [address] Phone: [phone]; Fax: [phone]

## 2019-10-25 ENCOUNTER — Other Ambulatory Visit: Payer: Self-pay

## 2019-10-25 ENCOUNTER — Encounter: Payer: Self-pay | Admitting: Physician Assistant

## 2019-10-25 ENCOUNTER — Ambulatory Visit (INDEPENDENT_AMBULATORY_CARE_PROVIDER_SITE_OTHER): Payer: Medicare HMO | Admitting: Physician Assistant

## 2019-10-25 VITALS — BP 100/56 | HR 74 | Ht 62.0 in | Wt 214.0 lb

## 2019-10-25 DIAGNOSIS — I5042 Chronic combined systolic (congestive) and diastolic (congestive) heart failure: Secondary | ICD-10-CM

## 2019-10-25 DIAGNOSIS — I2510 Atherosclerotic heart disease of native coronary artery without angina pectoris: Secondary | ICD-10-CM | POA: Diagnosis not present

## 2019-10-25 DIAGNOSIS — Z6839 Body mass index (BMI) 39.0-39.9, adult: Secondary | ICD-10-CM

## 2019-10-25 DIAGNOSIS — I255 Ischemic cardiomyopathy: Secondary | ICD-10-CM

## 2019-10-25 DIAGNOSIS — I7090 Unspecified atherosclerosis: Secondary | ICD-10-CM

## 2019-10-25 DIAGNOSIS — E785 Hyperlipidemia, unspecified: Secondary | ICD-10-CM

## 2019-10-25 MED ORDER — TICAGRELOR 90 MG PO TABS: 90.0000 mg | ORAL_TABLET | Freq: Two times a day (BID) | ORAL | 3 refills | Status: DC

## 2019-10-25 NOTE — Patient Instructions (Signed)
Medication Instructions:  Your physician recommends that you continue on your current medications as directed. Please refer to the Current Medication list given to you today.  *If you need a refill on your cardiac medications before your next appointment, please call your pharmacy*   Lab Work: FLP, LFT's  Your physician recommends that you return for a FASTING lipid profile. This has been scheduled for 01/25/2020  If you have labs (blood work) drawn today and your tests are completely normal, you will receive your results only by: Marland Kitchen MyChart Message (if you have MyChart) OR . A paper copy in the mail If you have any lab test that is abnormal or we need to change your treatment, we will call you to review the results.   Testing/Procedures: Your physician has requested that you have an echocardiogram. Echocardiography is a painless test that uses sound waves to create images of your heart. It provides your doctor with information about the size and shape of your heart and how well your heart's chambers and valves are working. This procedure takes approximately one hour. There are no restrictions for this procedure.     Follow-Up: At [facility], you and your health needs are our priority.  As part of our continuing mission to provide you with exceptional heart care, we have created designated Provider Care Teams.  These Care Teams include your primary Cardiologist (physician) and Advanced Practice Providers (APPs -  Physician Assistants and Nurse Practitioners) who all work together to provide you with the care you need, when you need it.  We recommend signing up for the patient portal called "MyChart".  Sign up information is provided on this After Visit Summary.  MyChart is used to connect with patients for Virtual Visits (Telemedicine).  Patients are able to view lab/test results, encounter notes, upcoming appointments, etc.  Non-urgent messages can be sent to your provider as well.   To  learn more about what you can do with MyChart, go to [email or web address].    Your next appointment:   12/28/2019 with Ermalinda Barrios, PA 02/13/2020 with Dr Angelena Form  The format for your next appointment:   In Person   Other Instructions Two Gram Sodium Diet 2000 mg  What is Sodium? Sodium is a mineral found naturally in many foods. The most significant source of sodium in the diet is table salt, which is about 40% sodium.  Processed, convenience, and preserved foods also contain a large amount of sodium.  The body needs only 500 mg of sodium daily to function,  A normal diet provides more than enough sodium even if you do not use salt.  Why Limit Sodium? A build up of sodium in the body can cause thirst, increased blood pressure, shortness of breath, and water retention.  Decreasing sodium in the diet can reduce edema and risk of heart attack or stroke associated with high blood pressure.  Keep in mind that there are many other factors involved in these health problems.  Heredity, obesity, lack of exercise, cigarette smoking, stress and what you eat all play a role.  General Guidelines:  Do not add salt at the table or in cooking.  One teaspoon of salt contains over 2 grams of sodium.  Read food labels  Avoid processed and convenience foods  Ask your dietitian before eating any foods not dicussed in the menu planning guidelines  Consult your physician if you wish to use a salt substitute or a sodium containing medication such as antacids.  Limit milk  and milk products to 16 oz (2 cups) per day.  Shopping Hints:  READ LABELS!! "Dietetic" does not necessarily mean low sodium.  Salt and other sodium ingredients are often added to foods during processing.   Menu Planning Guidelines Food Group Choose More Often Avoid  Beverages (see also the milk group All fruit juices, low-sodium, salt-free vegetables juices, low-sodium carbonated beverages Regular vegetable or tomato juices,  commercially softened water used for drinking or cooking  Breads and Cereals Enriched white, wheat, rye and pumpernickel bread, hard rolls and dinner rolls; muffins, cornbread and waffles; most dry cereals, cooked cereal without added salt; unsalted crackers and breadsticks; low sodium or homemade bread crumbs Bread, rolls and crackers with salted tops; quick breads; instant hot cereals; pancakes; commercial bread stuffing; self-rising flower and biscuit mixes; regular bread crumbs or cracker crumbs  Desserts and Sweets Desserts and sweets mad with mild should be within allowance Instant pudding mixes and cake mixes  Fats Butter or margarine; vegetable oils; unsalted salad dressings, regular salad dressings limited to 1 Tbs; light, sour and heavy cream Regular salad dressings containing bacon fat, bacon bits, and salt pork; snack dips made with instant soup mixes or processed cheese; salted nuts  Fruits Most fresh, frozen and canned fruits Fruits processed with salt or sodium-containing ingredient (some dried fruits are processed with sodium sulfites        Vegetables Fresh, frozen vegetables and low- sodium canned vegetables Regular canned vegetables, sauerkraut, pickled vegetables, and others prepared in brine; frozen vegetables in sauces; vegetables seasoned with ham, bacon or salt pork  Condiments, Sauces, Miscellaneous  Salt substitute with physician's approval; pepper, herbs, spices; vinegar, lemon or lime juice; hot pepper sauce; garlic powder, onion powder, low sodium soy sauce (1 Tbs.); low sodium condiments (ketchup, chili sauce, mustard) in limited amounts (1 tsp.) fresh ground horseradish; unsalted tortilla chips, pretzels, potato chips, popcorn, salsa (1/4 cup) Any seasoning made with salt including garlic salt, celery salt, onion salt, and seasoned salt; sea salt, rock salt, kosher salt; meat tenderizers; monosodium glutamate; mustard, regular soy sauce, barbecue, sauce, chili sauce,  teriyaki sauce, steak sauce, Worcestershire sauce, and most flavored vinegars; canned gravy and mixes; regular condiments; salted snack foods, olives, picles, relish, horseradish sauce, catsup   Food preparation: Try these seasonings Meats:    Pork Sage, onion Serve with applesauce  Chicken Poultry seasoning, thyme, parsley Serve with cranberry sauce  Lamb Curry powder, rosemary, garlic, thyme Serve with mint sauce or jelly  Veal Marjoram, basil Serve with current jelly, cranberry sauce  Beef Pepper, bay leaf Serve with dry mustard, unsalted chive butter  Fish Bay leaf, dill Serve with unsalted lemon butter, unsalted parsley butter  Vegetables:    Asparagus Lemon juice   Broccoli Lemon juice   Carrots Mustard dressing parsley, mint, nutmeg, glazed with unsalted butter and sugar   Green beans Marjoram, lemon juice, nutmeg,dill seed   Tomatoes Basil, marjoram, onion   Spice /blend for [employer]" 4 tsp ground thyme 1 tsp ground sage 3 tsp ground rosemary 4 tsp ground marjoram   Test your knowledge 1. A product that says "Salt Free" may still contain sodium. True or False 2. Garlic Powder and Hot Pepper Sauce an be used as alternative seasonings.True or False 3. Processed foods have more sodium than fresh foods.  True or False 4. Canned Vegetables have less sodium than froze True or False  WAYS TO DECREASE YOUR SODIUM INTAKE 1. Avoid the use of added salt in cooking and  at the table.  Table salt (and other prepared seasonings which contain salt) is probably one of the greatest sources of sodium in the diet.  Unsalted foods can gain flavor from the sweet, sour, and butter taste sensations of herbs and spices.  Instead of using salt for seasoning, try the following seasonings with the foods listed.  Remember: how you use them to enhance natural food flavors is limited only by your creativity... Allspice-Meat, fish, eggs, fruit, peas, red and yellow vegetables Almond Extract-Fruit baked  goods Anise Seed-Sweet breads, fruit, carrots, beets, cottage cheese, cookies (tastes like licorice) Basil-Meat, fish, eggs, vegetables, rice, vegetables salads, soups, sauces Bay Leaf-Meat, fish, stews, poultry Burnet-Salad, vegetables (cucumber-like flavor) Caraway Seed-Bread, cookies, cottage cheese, meat, vegetables, cheese, rice Cardamon-Baked goods, fruit, soups Celery Powder or seed-Salads, salad dressings, sauces, meatloaf, soup, bread.Do not use  celery salt Chervil-Meats, salads, fish, eggs, vegetables, cottage cheese (parsley-like flavor) Chili Power-Meatloaf, chicken cheese, corn, eggplant, egg dishes Chives-Salads cottage cheese, egg dishes, soups, vegetables, sauces Cilantro-Salsa, casseroles Cinnamon-Baked goods, fruit, pork, lamb, chicken, carrots Cloves-Fruit, baked goods, fish, pot roast, green beans, beets, carrots Coriander-Pastry, cookies, meat, salads, cheese (lemon-orange flavor) Cumin-Meatloaf, fish,cheese, eggs, cabbage,fruit pie (caraway flavor) [employer], fruit, eggs, fish, poultry, cottage cheese, vegetables Dill Seed-Meat, cottage cheese, poultry, vegetables, fish, salads, bread Fennel Seed-Bread, cookies, apples, pork, eggs, fish, beets, cabbage, cheese, Licorice-like flavor Garlic-(buds or powder) Salads, meat, poultry, fish, bread, butter, vegetables, potatoes.Do not  use garlic salt Ginger-Fruit, vegetables, baked goods, meat, fish, poultry Horseradish Root-Meet, vegetables, butter Lemon Juice or Extract-Vegetables, fruit, tea, baked goods, fish salads Mace-Baked goods fruit, vegetables, fish, poultry (taste like nutmeg) Maple Extract-Syrups Marjoram-Meat, chicken, fish, vegetables, breads, green salads (taste like Sage) Mint-Tea, lamb, sherbet, vegetables, desserts, carrots, cabbage Mustard, Dry or Seed-Cheese, eggs, meats, vegetables, poultry Nutmeg-Baked goods, fruit, chicken, eggs, vegetables, desserts Onion Powder-Meat, fish, poultry,  vegetables, cheese, eggs, bread, rice salads (Do not use   Onion salt) Orange Extract-Desserts, baked goods Oregano-Pasta, eggs, cheese, onions, pork, lamb, fish, chicken, vegetables, green salads Paprika-Meat, fish, poultry, eggs, cheese, vegetables Parsley Flakes-Butter, vegetables, meat fish, poultry, eggs, bread, salads (certain forms may   Contain sodium Pepper-Meat fish, poultry, vegetables, eggs Peppermint Extract-Desserts, baked goods Poppy Seed-Eggs, bread, cheese, fruit dressings, baked goods, noodles, vegetables, cottage  [employer], poultry, meat, fish, cauliflower, turnips,eggs bread Saffron-Rice, bread, veal, chicken, fish, eggs Sage-Meat, fish, poultry, onions, eggplant, tomateos, pork, stews Savory-Eggs, salads, poultry, meat, rice, vegetables, soups, pork Tarragon-Meat, poultry, fish, eggs, butter, vegetables (licorice-like flavor)  Thyme-Meat, poultry, fish, eggs, vegetables, (clover-like flavor), sauces, soups Tumeric-Salads, butter, eggs, fish, rice, vegetables (saffron-like flavor) Vanilla Extract-Baked goods, candy Vinegar-Salads, vegetables, meat marinades Walnut Extract-baked goods, candy  2. Choose your Foods Wisely   The following is a list of foods to avoid which are high in sodium:  Meats-Avoid all smoked, canned, salt cured, dried and kosher meat and fish as well as Anchovies   [employer] meats:Bologna, Liverwurst, Pastrami Canned meat or fish  Marinated herring Caviar    Pepperoni Corned Beef   Pizza Dried chipped beef  Salami Frozen breaded fish or meat Salt pork Frankfurters or hot dogs  Sardines Gefilte fish   Sausage Ham (boiled ham, Proscuitto Smoked butt    spiced ham)   Spam      TV Dinners Vegetables Canned vegetables (Regular) Relish Canned mushrooms  Sauerkraut Olives    Tomato juice Pickles  Bakery and Dessert Products Canned puddings  Cream pies Cheesecake  Decorated  cakes Cookies  Beverages/Juices Tomato juice, regular  Gatorade   V-8 vegetable juice, regular  Breads and Cereals Biscuit mixes   Salted potato chips, corn chips, pretzels Bread stuffing mixes  Salted crackers and rolls Pancake and waffle mixes Self-rising flour  Seasonings Accent    Meat sauces Barbecue sauce  Meat tenderizer Catsup    Monosodium glutamate (MSG) Celery salt   Onion salt Chili sauce   Prepared mustard Garlic salt   Salt, seasoned salt, sea salt Gravy mixes   Soy sauce Horseradish   Steak sauce Ketchup   Tartar sauce Lite salt    Teriyaki sauce Marinade mixes   Worcestershire sauce  Others Baking powder   Cocoa and cocoa mixes Baking soda   Commercial casserole mixes Candy-caramels, chocolate  Dehydrated soups    Bars, fudge,nougats  Instant rice and pasta mixes Canned broth or soup  Maraschino cherries Cheese, aged and processed cheese and cheese spreads  Learning Assessment Quiz  Indicated T (for True) or F (for False) for each of the following statements:  1. _____ Fresh fruits and vegetables and unprocessed grains are generally low in sodium 2. _____ Water may contain a considerable amount of sodium, depending on the source 3. _____ You can always tell if a food is high in sodium by tasting it 4. _____ Certain laxatives my be high in sodium and should be avoided unless prescribed   by a physician or pharmacist 5. _____ Salt substitutes may be used freely by anyone on a sodium restricted diet 6. _____ Sodium is present in table salt, food additives and as a natural component of   most foods 7. _____ Table salt is approximately 90% sodium 8. _____ Limiting sodium intake may help prevent excess fluid accumulation in the body 9. _____ On a sodium-restricted diet, seasonings such as bouillon soy sauce, and    cooking wine should be used in place of table salt 10. _____ On an ingredient list, a product which lists monosodium glutamate as the first    ingredient is an appropriate food to include on a low sodium diet  Circle the best answer(s) to the following statements (Hint: there may be more than one correct answer)  11. On a low-sodium diet, some acceptable snack items are:    A. Olives  F. Bean dip   K. Grapefruit juice    B. Salted Pretzels G. Commercial Popcorn   L. Canned peaches    C. Carrot Sticks  H. Bouillon   M. Unsalted nuts   D. [place] fries  I. Peanut butter crackers N. Salami   E. Sweet pickles J. Tomato Juice   O. Pizza  12.  Seasonings that may be used freely on a reduced - sodium diet include   A. Lemon wedges F.Monosodium glutamate K. Celery seed    B.Soysauce   G. Pepper   L. Mustard powder   C. Sea salt  H. Cooking wine  M. Onion flakes   D. Vinegar  E. Prepared horseradish N. Salsa   E. Sage   J. Worcestershire sauce  O. Chutney

## 2019-10-26 ENCOUNTER — Telehealth (HOSPITAL_COMMUNITY): Payer: Self-pay

## 2019-10-26 NOTE — Telephone Encounter (Signed)
Pt called wanting to schedule for cardiac rehab, advised pt that our nurse navigator has to review her follow up appt notes that she had on 7/27 before we can schedule her. Advised pt that our nurse navigator will be back in office on 7/29 and we would contact her once she has been cleared. Pt understood. Also sent message to the nurse navigator.

## 2019-10-28 ENCOUNTER — Other Ambulatory Visit: Payer: Self-pay

## 2019-10-28 ENCOUNTER — Other Ambulatory Visit: Payer: Self-pay | Admitting: Physician Assistant

## 2019-10-28 ENCOUNTER — Ambulatory Visit (HOSPITAL_COMMUNITY)
Admission: RE | Admit: 2019-10-28 | Discharge: 2019-10-28 | Disposition: A | Discharge status: Home / Self Care | Payer: Medicare HMO | Source: Ambulatory Visit | Attending: Cardiovascular Disease | Admitting: Cardiovascular Disease

## 2019-10-28 DIAGNOSIS — E785 Hyperlipidemia, unspecified: Secondary | ICD-10-CM

## 2019-10-28 DIAGNOSIS — I6523 Occlusion and stenosis of bilateral carotid arteries: Secondary | ICD-10-CM | POA: Diagnosis not present

## 2019-10-28 DIAGNOSIS — I2510 Atherosclerotic heart disease of native coronary artery without angina pectoris: Secondary | ICD-10-CM | POA: Diagnosis present

## 2019-10-28 DIAGNOSIS — I7090 Unspecified atherosclerosis: Secondary | ICD-10-CM | POA: Diagnosis not present

## 2019-11-14 ENCOUNTER — Telehealth (HOSPITAL_COMMUNITY): Payer: Self-pay | Admitting: *Deleted

## 2019-11-14 NOTE — Telephone Encounter (Signed)
Left message to call cardiac rehab regarding cardiac rehab orientation.Barnet Pall, RN,BSN 11/14/2019 2:27 PM

## 2019-11-15 ENCOUNTER — Telehealth (HOSPITAL_COMMUNITY): Payer: Self-pay | Admitting: Family Medicine

## 2019-11-15 ENCOUNTER — Telehealth (HOSPITAL_COMMUNITY): Payer: Self-pay

## 2019-11-15 ENCOUNTER — Other Ambulatory Visit: Payer: Self-pay

## 2019-11-15 ENCOUNTER — Telehealth (HOSPITAL_COMMUNITY): Payer: Self-pay | Admitting: Pharmacist

## 2019-11-15 ENCOUNTER — Encounter (HOSPITAL_COMMUNITY)
Admission: RE | Admit: 2019-11-15 | Discharge: 2019-11-15 | Disposition: A | Discharge status: Home / Self Care | Payer: Medicare HMO | Source: Ambulatory Visit | Attending: Cardiovascular Disease | Admitting: Cardiovascular Disease

## 2019-11-15 DIAGNOSIS — I5042 Chronic combined systolic (congestive) and diastolic (congestive) heart failure: Secondary | ICD-10-CM | POA: Insufficient documentation

## 2019-11-15 DIAGNOSIS — Z955 Presence of coronary angioplasty implant and graft: Secondary | ICD-10-CM | POA: Insufficient documentation

## 2019-11-15 DIAGNOSIS — I214 Non-ST elevation (NSTEMI) myocardial infarction: Secondary | ICD-10-CM | POA: Insufficient documentation

## 2019-11-15 NOTE — Telephone Encounter (Signed)
Cardiac Rehab Note:  Unsuccessful telephone to Valerie Fields as a follow up to confirm cardiac rehab orientation for 11/22/19. [employer] compliant VM message left requesting call back to [phone].  [name] RN, BSN [place]. [facility]  Cardiac and Pulmonary Rehabilitation Phone: [phone] Fax: [phone]

## 2019-11-15 NOTE — Progress Notes (Signed)
Cardiac Rehab Telephone Note:  Successful telephone encounter to Valerie Fields to confirm Cardiac Rehab orientation appointment for 11/22/19 at 1:30 pm. Nursing assessment completed. Patient questions answered. Instructions for appointment provided. Patient screening for Covid-19 negative.  Letrice Pollok E. Rollene Rotunda RN, BSN [place]. [facility]  Cardiac and Pulmonary Rehabilitation Phone: [phone] Fax: [phone]

## 2019-11-16 NOTE — Telephone Encounter (Addendum)
Cardiac Rehab Medication Review by a Pharmacist  Does the patient  feel that his/her medications are working for him/her?  yes she hopes so. States many medications are new for her.  Has the patient been experiencing any side effects to the medications prescribed?  no  Does the patient measure his/her own blood pressure or blood glucose at home?   Yes checks BP daily. BP 123/68 today.  No - blood glucose  Does the patient have any problems obtaining medications due to transportation or finances?   no  Understanding of regimen: good Understanding of indications: good Potential of compliance: good  Pharmacist Intervention: discussed appropriate BP technique  Fara Olden, PharmD PGY-1 Ambulatory Care Pharmacy Resident 11/16/2019 6:11 PM

## 2019-11-22 ENCOUNTER — Encounter (HOSPITAL_COMMUNITY)
Admission: RE | Admit: 2019-11-22 | Discharge: 2019-11-22 | Disposition: A | Discharge status: Home / Self Care | Payer: Medicare HMO | Source: Ambulatory Visit | Attending: Cardiovascular Disease | Admitting: Cardiovascular Disease

## 2019-11-22 ENCOUNTER — Telehealth: Payer: Self-pay | Admitting: Medical

## 2019-11-22 ENCOUNTER — Other Ambulatory Visit: Payer: Self-pay

## 2019-11-22 ENCOUNTER — Encounter (HOSPITAL_COMMUNITY): Payer: Self-pay

## 2019-11-22 ENCOUNTER — Ambulatory Visit (HOSPITAL_COMMUNITY)
Admission: RE | Admit: 2019-11-22 | Discharge: 2019-11-22 | Disposition: A | Discharge status: Home / Self Care | Payer: Medicare HMO | Source: Ambulatory Visit | Attending: Cardiovascular Disease | Admitting: Cardiovascular Disease

## 2019-11-22 VITALS — BP 104/72 | HR 69 | Ht 62.0 in | Wt 215.2 lb

## 2019-11-22 DIAGNOSIS — I214 Non-ST elevation (NSTEMI) myocardial infarction: Secondary | ICD-10-CM | POA: Diagnosis present

## 2019-11-22 DIAGNOSIS — Z955 Presence of coronary angioplasty implant and graft: Secondary | ICD-10-CM | POA: Diagnosis present

## 2019-11-22 DIAGNOSIS — I5042 Chronic combined systolic (congestive) and diastolic (congestive) heart failure: Secondary | ICD-10-CM | POA: Diagnosis present

## 2019-11-22 NOTE — Progress Notes (Signed)
Yerlin reports that she experienced some mild indigestion towards the end of her walk test towrds her back and left scapula area. Patient denies chest pain but reports feeling acid in her throat. Telemetry rhythm Sinus 88. Blood pressure post exercise 116/60. Oxygen saturation 98% on room air. Ed Blalock PA paged and notified. Candace ordered a 12 lead ECG. 12 lead ECG obtained. Candace reviewed the patient's 12 lead ECG and did not notice any changes. Ed Blalock PA said she will update Dr Angelena Form. Bette said that she ate a biscuit form [place] this morning at 11:30 am with some hash browns. Bette says that she experiences indigestion every two to three days and had some indigestion before her MI/ Stent. Patient left cardiac rehab without symptoms or complaints.Barnet Pall, RN,BSN 11/22/2019 3:43 PM

## 2019-11-22 NOTE — Telephone Encounter (Signed)
Paged from cardiac rehab during patients orientation. [name] called to report patient was walking on the treadmill and afterwards she noted some mild indigestion, felt burning into the back. [name] feels like her normal indigestion, acid-reflux.  [name] was worried because the patient had symptoms of indigestion when she had her heart attack. Patient denied CP, SOB, palpitations, lightheadedness, dizziness. I agreed we can check a 12-lead to be complete. Also noted patient was not on a PPI. Will let Dr. Angelena Form know.   Valerie Rossa Kathlen Mody, PA-C

## 2019-11-22 NOTE — Progress Notes (Signed)
Cardiac Individual Treatment Plan  Patient Details  Name: Valerie Fields MRN: [id number] Date of Birth: 04-Nov-1950 Referring Provider:     [place] from 11/22/2019 in [place]  Referring Provider Lauree Chandler MD      Initial Encounter Date:    CARDIAC REHAB PHASE II ORIENTATION from 11/22/2019 in [place]  Date 11/22/19      Visit Diagnosis: NSTEMI (non-ST elevated myocardial infarction) ([place]) 10/13/19  S/P DES mLAD 10/13/19  Patient's Home Medications on Admission:  Current Outpatient Medications:  .  acetaminophen (TYLENOL) 325 MG tablet, Take 2 tablets (650 mg total) by mouth every 4 (four) hours as needed for mild pain, moderate pain or headache., Disp: , Rfl:  .  aspirin 81 MG chewable tablet, Chew 1 tablet (81 mg total) by mouth daily., Disp: , Rfl:  .  atorvastatin (LIPITOR) 80 MG tablet, Take 1 tablet (80 mg total) by mouth daily., Disp: 30 tablet, Rfl: 11 .  azelastine (ASTELIN) 0.1 % nasal spray, Place 1 spray into both nostrils daily. Use in each nostril as directed , Disp: , Rfl:  .  cetirizine (ZYRTEC) 10 MG tablet, Take 10 mg by mouth daily., Disp: , Rfl:  .  cholecalciferol (VITAMIN D) 1000 units tablet, Take 2,000 Units daily by mouth. (Patient not taking: Reported on 11/16/2019), Disp: , Rfl:  .  Coenzyme Q10 (CO Q 10 PO), Take 1 capsule by mouth daily. (Patient not taking: Reported on 11/16/2019), Disp: , Rfl:  .  dicyclomine (BENTYL) 10 MG capsule, Take 1 tablet by mouth every 6 hours as needed. (Patient taking differently: Take 10 mg by mouth every 6 (six) hours as needed for spasms. ), Disp: 15 capsule, Rfl: 2 .  diphenhydrAMINE (SLEEP AID, DIPHENHYDRAMINE,) 25 MG tablet, Take 25 mg by mouth at bedtime as needed for sleep., Disp: , Rfl:  .  docusate sodium (STOOL SOFTENER) 100 MG capsule, Take 100 mg by mouth daily. , Disp: , Rfl:  .  losartan (COZAAR) 25 MG  tablet, Take 1 tablet (25 mg total) by mouth daily., Disp: 30 tablet, Rfl: 11 .  melatonin 3 MG TABS tablet, Take 6 mg by mouth at bedtime., Disp: , Rfl:  .  metoprolol succinate (TOPROL-XL) 25 MG 24 hr tablet, Take 1 tablet (25 mg total) by mouth daily., Disp: 30 tablet, Rfl: 11 .  montelukast (SINGULAIR) 10 MG tablet, Take 10 mg by mouth at bedtime. , Disp: , Rfl: 3 .  Multiple Minerals (CALCIUM-MAGNESIUM-ZINC) TABS, Take 1-2 tablets by mouth daily. (Patient not taking: Reported on 11/16/2019), Disp: , Rfl:  .  nitroGLYCERIN (NITROSTAT) 0.4 MG SL tablet, Place 1 tablet (0.4 mg total) under the tongue every 5 (five) minutes as needed for chest pain., Disp: 25 tablet, Rfl: 12 .  psyllium (REGULOID) 0.52 g capsule, Take 1.5 g by mouth daily., Disp: , Rfl:  .  ticagrelor (BRILINTA) 90 MG TABS tablet, Take 1 tablet (90 mg total) by mouth 2 (two) times daily., Disp: 180 tablet, Rfl: 3  Past Medical History: Past Medical History:  Diagnosis Date  . Allergy   . Asthma   . Chronic combined systolic and diastolic CHF 01/14/5101   Ischemic CM // Echocardiogram 7/21: EF 40-45, Gr 2 DD, mid to apical ant/ant-sept AK, RVSP 23.4, trivial MR  . Coronary artery disease    NSTEMI 7/21: oLAD 30, pLAD 99 >> PCI: DES; D1 90 >> PCI: POBA, prox and mid  RCA 15, EF 35  . Diverticulitis   . GERD (gastroesophageal reflux disease)   . Hyperlipidemia 10/15/2019   Zetia DC'd during admit for NSTEMI 7/21 >> ? to Atorvastatin  . Ischemic cardiomyopathy 10/15/2019  . Non-ST elevation (NSTEMI) MI 10/13/2019   PCI:  DES to LAD and POBA to D1    Tobacco Use: Social History   Tobacco Use  Smoking Status Never Smoker  Smokeless Tobacco Never Used    Labs: Recent Review Scientist, physiological    Labs for ITP Cardiac and Pulmonary Rehab Latest Ref Rng & Units 10/13/2019   Cholestrol 0 - 200 mg/dL 176   LDLCALC 0 - 99 mg/dL 96   HDL >40 mg/dL 48   Trlycerides <150 mg/dL 161(H)   Hemoglobin A1c 4.8 - 5.6 % 5.6       Capillary Blood Glucose: No results found for: GLUCAP   Exercise Target Goals: Exercise Program Goal: Individual exercise prescription set using results from initial 6 min walk test and THRR while considering  patient's activity barriers and safety.   Exercise Prescription Goal: Starting with aerobic activity 30 plus minutes a day, 3 days per week for initial exercise prescription. Provide home exercise prescription and guidelines that participant acknowledges understanding prior to discharge.  Activity Barriers & Risk Stratification:  Activity Barriers & Cardiac Risk Stratification - 11/22/19 1545      Activity Barriers & Cardiac Risk Stratification   Activity Barriers Joint Problems;Other (comment)    Comments R knee osteoarthritis, Left hastring knee injury    Cardiac Risk Stratification High           6 Minute Walk:  6 Minute Walk    Row Name 11/22/19 1514         6 Minute Walk   Phase Initial     Distance 1428 feet     Walk Time 6 minutes     # of Rest Breaks 0     MPH 2.7     METS 2.52     RPE 13     Perceived Dyspnea  1     VO2 Peak 8.82     Symptoms Yes (comment)     Comments SOB due to mask, RPD =1.  Pt c/o indigestion in left upper back # 2 on 0-10 scale. EKG ordered.     Resting HR 79 bpm     Resting BP 104/72     Resting Oxygen Saturation  98 %     Exercise Oxygen Saturation  during 6 min walk 97 %     Max Ex. HR 91 bpm     Max Ex. BP 132/68     2 Minute Post BP 116/66            Oxygen Initial Assessment:   Oxygen Re-Evaluation:   Oxygen Discharge (Final Oxygen Re-Evaluation):   Initial Exercise Prescription:  Initial Exercise Prescription - 11/22/19 1500      Date of Initial Exercise RX and Referring Provider   Date 11/22/19    Referring Provider Lauree Chandler MD    Expected Discharge Date 01/20/20      NuStep   Level 1    SPM 75    Minutes 25    METs 1.5      Prescription Details   Frequency (times per week) 3     Duration Progress to 30 minutes of continuous aerobic without signs/symptoms of physical distress      Intensity   THRR 40-80% of Max Heartrate  61-122    Ratings of Perceived Exertion 11-13    Perceived Dyspnea 0-4      Progression   Progression Continue progressive overload as per policy without signs/symptoms or physical distress.      Resistance Training   Training Prescription Yes    Weight 2 lbs    Reps 10-15           Perform Capillary Blood Glucose checks as needed.  Exercise Prescription Changes:   Exercise Comments:   Exercise Goals and Review:   Exercise Goals    Row Name 11/22/19 1440             Exercise Goals   Increase Physical Activity Yes       Intervention Provide advice, education, support and counseling about physical activity/exercise needs.;Develop an individualized exercise prescription for aerobic and resistive training based on initial evaluation findings, risk stratification, comorbidities and participant's personal goals.       Expected Outcomes Short Term: Attend rehab on a regular basis to increase amount of physical activity.;Long Term: Add in home exercise to make exercise part of routine and to increase amount of physical activity.;Long Term: Exercising regularly at least 3-5 days a week.       Increase Strength and Stamina Yes       Intervention Provide advice, education, support and counseling about physical activity/exercise needs.;Develop an individualized exercise prescription for aerobic and resistive training based on initial evaluation findings, risk stratification, comorbidities and participant's personal goals.       Expected Outcomes Short Term: Increase workloads from initial exercise prescription for resistance, speed, and METs.;Short Term: Perform resistance training exercises routinely during rehab and add in resistance training at home;Long Term: Improve cardiorespiratory fitness, muscular endurance and strength as measured  by increased METs and functional capacity (6MWT)       Able to understand and use rate of perceived exertion (RPE) scale Yes       Intervention Provide education and explanation on how to use RPE scale       Expected Outcomes Short Term: Able to use RPE daily in rehab to express subjective intensity level;Long Term:  Able to use RPE to guide intensity level when exercising independently       Knowledge and understanding of Target Heart Rate Range (THRR) Yes       Intervention Provide education and explanation of THRR including how the numbers were predicted and where they are located for reference       Expected Outcomes Short Term: Able to state/look up THRR;Short Term: Able to use daily as guideline for intensity in rehab;Long Term: Able to use THRR to govern intensity when exercising independently       Able to check pulse independently Yes       Intervention Provide education and demonstration on how to check pulse in carotid and radial arteries.;Review the importance of being able to check your own pulse for safety during independent exercise       Expected Outcomes Short Term: Able to explain why pulse checking is important during independent exercise;Long Term: Able to check pulse independently and accurately       Understanding of Exercise Prescription Yes       Intervention Provide education, explanation, and written materials on patient's individual exercise prescription       Expected Outcomes Short Term: Able to explain program exercise prescription;Long Term: Able to explain home exercise prescription to exercise independently  Exercise Goals Re-Evaluation :    Discharge Exercise Prescription (Final Exercise Prescription Changes):   Nutrition:  Target Goals: Understanding of nutrition guidelines, daily intake of sodium 1500mg , cholesterol 200mg , calories 30% from fat and 7% or less from saturated fats, daily to have 5 or more servings of fruits and  vegetables.  Biometrics:  Pre Biometrics - 11/22/19 1330      Pre Biometrics   Waist Circumference 44 inches    Hip Circumference 53.25 inches    Waist to Hip Ratio 0.83 %    Triceps Skinfold 32 mm    % Body Fat 49.6 %    Grip Strength 32 kg    Flexibility 12 in    Single Leg Stand 29.85 seconds            Nutrition Therapy Plan and Nutrition Goals:   Nutrition Assessments:   Nutrition Goals Re-Evaluation:   Nutrition Goals Discharge (Final Nutrition Goals Re-Evaluation):   Psychosocial: Target Goals: Acknowledge presence or absence of significant depression and/or stress, maximize coping skills, provide positive support system. Participant is able to verbalize types and ability to use techniques and skills needed for reducing stress and depression.  Initial Review & Psychosocial Screening:  Initial Psych Review & Screening - 11/22/19 1346      Initial Review   Current issues with Current Anxiety/Panic   Bette says she has had some anxiety since her hospitalization     Family Dynamics   Good Support System? Yes   Bette lives alone but has a good support network of friends and neighbors who live nearby. Bette's children live out of the area     Barriers   Psychosocial barriers to participate in program The patient should benefit from training in stress management and relaxation.      Screening Interventions   Interventions Encouraged to exercise;To provide support and resources with identified psychosocial needs    Expected Outcomes Long Term Goal: Stressors or current issues are controlled or eliminated.;Short Term goal: Identification and review with participant of any Quality of Life or Depression concerns found by scoring the questionnaire.           Quality of Life Scores:  Quality of Life - 11/22/19 1519      Quality of Life   Select Quality of Life      Quality of Life Scores   Health/Function Pre 27 %    Socioeconomic Pre 30 %    Psych/Spiritual  Pre 30 %    Family Pre 30 %    GLOBAL Pre 28.69 %          Scores of 19 and below usually indicate a poorer quality of life in these areas.  A difference of  2-3 points is a clinically meaningful difference.  A difference of 2-3 points in the total score of the Quality of Life Index has been associated with significant improvement in overall quality of life, self-image, physical symptoms, and general health in studies assessing change in quality of life.  PHQ-9: Recent Review Flowsheet Data    Depression screen [facility] 2/9 11/22/2019   Decreased Interest 0   Down, Depressed, Hopeless 0   PHQ - 2 Score 0     Interpretation of Total Score  Total Score Depression Severity:  1-4 = Minimal depression, 5-9 = Mild depression, 10-14 = Moderate depression, 15-19 = Moderately severe depression, 20-27 = Severe depression   Psychosocial Evaluation and Intervention:   Psychosocial Re-Evaluation:   Psychosocial Discharge (Final Psychosocial  Re-Evaluation):   Vocational Rehabilitation: Provide vocational rehab assistance to qualifying candidates.   Vocational Rehab Evaluation & Intervention:  Vocational Rehab - 11/22/19 1550      Initial Vocational Rehab Evaluation & Intervention   Assessment shows need for Vocational Rehabilitation No   Otilio Saber is retired and does not need vocational rehab at this time.          Education: Education Goals: Education classes will be provided on a weekly basis, covering required topics. Participant will state understanding/return demonstration of topics presented.  Learning Barriers/Preferences:  Learning Barriers/Preferences - 11/22/19 1520      Learning Barriers/Preferences   Learning Barriers None    Learning Preferences Written Material;Audio;Verbal Instruction;Computer/Internet           Education Topics: Hypertension, Hypertension Reduction -Define heart disease and high blood pressure. Discus how high blood pressure affects the body and  ways to reduce high blood pressure.   Exercise and Your Heart -Discuss why it is important to exercise, the FITT principles of exercise, normal and abnormal responses to exercise, and how to exercise safely.   Angina -Discuss definition of angina, causes of angina, treatment of angina, and how to decrease risk of having angina.   Cardiac Medications -Review what the following cardiac medications are used for, how they affect the body, and side effects that may occur when taking the medications.  Medications include Aspirin, Beta blockers, calcium channel blockers, ACE Inhibitors, angiotensin receptor blockers, diuretics, digoxin, and antihyperlipidemics.   Congestive Heart Failure -Discuss the definition of CHF, how to live with CHF, the signs and symptoms of CHF, and how keep track of weight and sodium intake.   Heart Disease and Intimacy -Discus the effect sexual activity has on the heart, how changes occur during intimacy as we age, and safety during sexual activity.   Smoking Cessation / COPD -Discuss different methods to quit smoking, the health benefits of quitting smoking, and the definition of COPD.   Nutrition I: Fats -Discuss the types of cholesterol, what cholesterol does to the heart, and how cholesterol levels can be controlled.   Nutrition II: Labels -Discuss the different components of food labels and how to read food label   Heart Parts/Heart Disease and PAD -Discuss the anatomy of the heart, the pathway of blood circulation through the heart, and these are affected by heart disease.   Stress I: Signs and Symptoms -Discuss the causes of stress, how stress may lead to anxiety and depression, and ways to limit stress.   Stress II: Relaxation -Discuss different types of relaxation techniques to limit stress.   Warning Signs of Stroke / TIA -Discuss definition of a stroke, what the signs and symptoms are of a stroke, and how to identify when someone is  having stroke.   Knowledge Questionnaire Score:  Knowledge Questionnaire Score - 11/22/19 1522      Knowledge Questionnaire Score   Pre Score 19/24           Core Components/Risk Factors/Patient Goals at Admission:  Personal Goals and Risk Factors at Admission - 11/22/19 1552      Core Components/Risk Factors/Patient Goals on Admission    Weight Management Yes;Obesity;Weight Loss    Admit Weight 215 lb 2.7 oz (97.6 kg)    Expected Outcomes Short Term: Continue to assess and modify interventions until short term weight is achieved;Long Term: Adherence to nutrition and physical activity/exercise program aimed toward attainment of established weight goal;Weight Maintenance: Understanding of the daily nutrition guidelines, which includes 25-35%  calories from fat, 7% or less cal from saturated fats, less than 200mg  cholesterol, less than 1.5gm of sodium, & 5 or more servings of fruits and vegetables daily;Weight Loss: Understanding of general recommendations for a balanced deficit meal plan, which promotes 1-2 lb weight loss per week and includes a negative energy balance of [phone] kcal/d;Understanding recommendations for meals to include 15-35% energy as protein, 25-35% energy from fat, 35-60% energy from carbohydrates, less than 200mg  of dietary cholesterol, 20-35 gm of total fiber daily;Understanding of distribution of calorie intake throughout the day with the consumption of 4-5 meals/snacks    Lipids Yes    Intervention Provide education and support for participant on nutrition & aerobic/resistive exercise along with prescribed medications to achieve LDL 70mg , HDL >40mg .    Expected Outcomes Short Term: Participant states understanding of desired cholesterol values and is compliant with medications prescribed. Participant is following exercise prescription and nutrition guidelines.;Long Term: Cholesterol controlled with medications as prescribed, with individualized exercise RX and with  personalized nutrition plan. Value goals: LDL < 70mg , HDL > 40 mg.    Stress Yes    Intervention Offer individual and/or small group education and counseling on adjustment to heart disease, stress management and health-related lifestyle change. Teach and support self-help strategies.;Refer participants experiencing significant psychosocial distress to appropriate mental health specialists for further evaluation and treatment. When possible, include family members and significant others in education/counseling sessions.    Expected Outcomes Short Term: Participant demonstrates changes in health-related behavior, relaxation and other stress management skills, ability to obtain effective social support, and compliance with psychotropic medications if prescribed.;Long Term: Emotional wellbeing is indicated by absence of clinically significant psychosocial distress or social isolation.           Core Components/Risk Factors/Patient Goals Review:    Core Components/Risk Factors/Patient Goals at Discharge (Final Review):    ITP Comments:  ITP Comments    Row Name 11/22/19 1340           ITP Comments Dr Fransico Him MD, Medical Director              Comments: Otilio Saber attended orientation on 11/22/2019 to review rules and guidelines for program.  Completed 6 minute walk test, Intitial ITP, and exercise prescription.  VSS. Telemetry-Sinus Rhythm with a small QRS complex .Please see previous note as patient experienced some reports of indigestion. . Safety measures and social distancing in place per CDC guidelines.Barnet Pall, RN,BSN 11/22/2019 3:58 PM

## 2019-11-23 ENCOUNTER — Telehealth: Payer: Self-pay | Admitting: Cardiology

## 2019-11-23 MED ORDER — PANTOPRAZOLE SODIUM 40 MG PO TBEC
40.0000 mg | DELAYED_RELEASE_TABLET | Freq: Every day | ORAL | 11 refills | Status: DC
Start: 2019-11-23 — End: 2020-02-13

## 2019-11-23 NOTE — Telephone Encounter (Signed)
Spoke with the patient. She states she really is feeling fine.  Doing various physical activities and does not have any chest pain or burning or SOB.  "I really feel like this is indigestion.  I have had it in the past."   Not currently on any mediation for this. She verbalizes understanding to start pantoprazole 40 mg daily and to call if symptoms do not resolve or if there are any new symptoms.

## 2019-11-23 NOTE — Telephone Encounter (Signed)
Left message for patient to call back  

## 2019-11-23 NOTE — Telephone Encounter (Signed)
See telephone encounter 11/22/19.

## 2019-11-23 NOTE — Telephone Encounter (Signed)
Would you mind checking on Valerie Fields to see how she is doing? If she is feeling better, we can start Protonix 40 mg daily for GERD. If she is still having issues, she will need to be seen in the office. Gerald Stabs

## 2019-11-23 NOTE — Telephone Encounter (Signed)
Patient is calling for EKG results

## 2019-11-23 NOTE — Addendum Note (Signed)
Addended by: Rodman Key on: 11/23/2019 05:36 PM   Modules accepted: Orders

## 2019-11-28 ENCOUNTER — Other Ambulatory Visit: Payer: Self-pay

## 2019-11-28 ENCOUNTER — Encounter (HOSPITAL_COMMUNITY)
Admission: RE | Admit: 2019-11-28 | Discharge: 2019-11-28 | Disposition: A | Discharge status: Home / Self Care | Payer: Medicare HMO | Source: Ambulatory Visit | Attending: Cardiovascular Disease | Admitting: Cardiovascular Disease

## 2019-11-28 DIAGNOSIS — Z955 Presence of coronary angioplasty implant and graft: Secondary | ICD-10-CM

## 2019-11-28 DIAGNOSIS — I5042 Chronic combined systolic (congestive) and diastolic (congestive) heart failure: Secondary | ICD-10-CM

## 2019-11-28 DIAGNOSIS — I214 Non-ST elevation (NSTEMI) myocardial infarction: Secondary | ICD-10-CM

## 2019-11-28 NOTE — Progress Notes (Signed)
Daily Session Note  Patient Details  Name: Valerie Fields MRN: [id number] Date of Birth: 1950-12-17 Referring Provider:     [place] from 11/22/2019 in [place]  Referring Provider Lauree Chandler MD      Encounter Date: 11/28/2019  Check In:  Session Check In - 11/28/19 1335      Check-In   Supervising physician immediately available to respond to emergencies [facility] immediately available    Physician(s) Dr. Maylene Roes    Location [facility]    Staff Present Lesly Rubenstein, MS, EP-C, CCRP;Tyara Nevels, MS,ACSM CEP, Exercise Physiologist;Abbie Berling Rollene Rotunda, RN, Deland Pretty, MS, ACSM CEP, Exercise Physiologist;Maria Whitaker, RN, BSN    Virtual Visit No    Medication changes reported     No    Fall or balance concerns reported    No    Tobacco Cessation No Change    Warm-up and Cool-down Performed on first and last piece of equipment    Resistance Training Performed Yes    VAD Patient? No    PAD/SET Patient? No      Pain Assessment   Currently in Pain? No/denies    Pain Score 0-No pain    Multiple Pain Sites No           Capillary Blood Glucose: No results found for this or any previous visit (from the past 24 hour(s)).   Exercise Prescription Changes - 11/28/19 1440      Response to Exercise   Blood Pressure (Admit) 110/72    Blood Pressure (Exercise) 114/82    Blood Pressure (Exit) 110/60    Heart Rate (Admit) 88 bpm    Heart Rate (Exercise) 90 bpm    Heart Rate (Exit) 68 bpm    Rating of Perceived Exertion (Exercise) 11    Symptoms None    Comments Pt's first day of exercise in the [facility] program.    Duration Progress to 30 minutes of  aerobic without signs/symptoms of physical distress    Intensity THRR unchanged      Progression   Progression Continue to progress workloads to maintain intensity without signs/symptoms of physical distress.    Average METs 1.6       Resistance Training   Training Prescription Yes    Weight 2 lbs    Reps 10-15    Time 10 Minutes      Interval Training   Interval Training No      NuStep   Level 1    SPM 75    Minutes 25    METs 1.6           Social History   Tobacco Use  Smoking Status Never Smoker  Smokeless Tobacco Never Used    Goals Met:  Exercise tolerated well Personal goals reviewed No report of cardiac concerns or symptoms Strength training completed today  Goals Unmet:  Not Applicable  Comments: Pt started cardiac rehab today.  Pt tolerated light exercise without difficulty. VSS, telemetry-NSR low voltage QRS, asymptomatic.  Medication list reconciled. Pt denies barriers to medicaiton compliance.  PSYCHOSOCIAL ASSESSMENT:  PHQ-0. Pt exhibits positive coping skills, hopeful outlook with supportive family. No psychosocial needs identified at this time, no psychosocial interventions necessary.  Pt oriented to exercise equipment and routine. Understanding verbalized.   Dr. Fransico Him is Medical Director for Cardiac Rehab at [facility].

## 2019-11-30 ENCOUNTER — Encounter (HOSPITAL_COMMUNITY)
Admission: RE | Admit: 2019-11-30 | Discharge: 2019-11-30 | Disposition: A | Discharge status: Home / Self Care | Payer: Medicare HMO | Source: Ambulatory Visit | Attending: Cardiovascular Disease | Admitting: Cardiovascular Disease

## 2019-11-30 ENCOUNTER — Other Ambulatory Visit: Payer: Self-pay

## 2019-11-30 DIAGNOSIS — I214 Non-ST elevation (NSTEMI) myocardial infarction: Secondary | ICD-10-CM | POA: Diagnosis present

## 2019-11-30 DIAGNOSIS — I5042 Chronic combined systolic (congestive) and diastolic (congestive) heart failure: Secondary | ICD-10-CM | POA: Diagnosis present

## 2019-11-30 DIAGNOSIS — Z955 Presence of coronary angioplasty implant and graft: Secondary | ICD-10-CM | POA: Diagnosis present

## 2019-12-02 ENCOUNTER — Encounter (HOSPITAL_COMMUNITY)
Admission: RE | Admit: 2019-12-02 | Discharge: 2019-12-02 | Disposition: A | Discharge status: Home / Self Care | Payer: Medicare HMO | Source: Ambulatory Visit | Attending: Cardiovascular Disease | Admitting: Cardiovascular Disease

## 2019-12-02 ENCOUNTER — Other Ambulatory Visit: Payer: Self-pay

## 2019-12-02 DIAGNOSIS — Z955 Presence of coronary angioplasty implant and graft: Secondary | ICD-10-CM

## 2019-12-02 DIAGNOSIS — I5042 Chronic combined systolic (congestive) and diastolic (congestive) heart failure: Secondary | ICD-10-CM | POA: Diagnosis not present

## 2019-12-02 DIAGNOSIS — I214 Non-ST elevation (NSTEMI) myocardial infarction: Secondary | ICD-10-CM

## 2019-12-06 NOTE — Progress Notes (Signed)
Cardiac Individual Treatment Plan  Patient Details  Name: Valerie Fields MRN: [id number] Date of Birth: 09/23/1950 Referring Provider:     [place] from 11/22/2019 in [facility]  Referring Provider Lauree Chandler MD      Initial Encounter Date:    CARDIAC REHAB PHASE II ORIENTATION from 11/22/2019 in [facility]  Date 11/22/19      Visit Diagnosis: NSTEMI (non-ST elevated myocardial infarction) ([facility]) 10/13/19  S/P DES mLAD 10/13/19  Chronic combined systolic and diastolic CHF (congestive heart failure) ([place])  Patient's Home Medications on Admission:  Current Outpatient Medications:  .  acetaminophen (TYLENOL) 325 MG tablet, Take 2 tablets (650 mg total) by mouth every 4 (four) hours as needed for mild pain, moderate pain or headache., Disp: , Rfl:  .  aspirin 81 MG chewable tablet, Chew 1 tablet (81 mg total) by mouth daily., Disp: , Rfl:  .  atorvastatin (LIPITOR) 80 MG tablet, Take 1 tablet (80 mg total) by mouth daily., Disp: 30 tablet, Rfl: 11 .  azelastine (ASTELIN) 0.1 % nasal spray, Place 1 spray into both nostrils daily. Use in each nostril as directed , Disp: , Rfl:  .  cetirizine (ZYRTEC) 10 MG tablet, Take 10 mg by mouth daily., Disp: , Rfl:  .  cholecalciferol (VITAMIN D) 1000 units tablet, Take 2,000 Units daily by mouth. (Patient not taking: Reported on 11/16/2019), Disp: , Rfl:  .  Coenzyme Q10 (CO Q 10 PO), Take 1 capsule by mouth daily. (Patient not taking: Reported on 11/16/2019), Disp: , Rfl:  .  dicyclomine (BENTYL) 10 MG capsule, Take 1 tablet by mouth every 6 hours as needed. (Patient taking differently: Take 10 mg by mouth every 6 (six) hours as needed for spasms. ), Disp: 15 capsule, Rfl: 2 .  diphenhydrAMINE (SLEEP AID, DIPHENHYDRAMINE,) 25 MG tablet, Take 25 mg by mouth at bedtime as needed for sleep., Disp: , Rfl:  .  docusate sodium (STOOL SOFTENER) 100 MG  capsule, Take 100 mg by mouth daily. , Disp: , Rfl:  .  losartan (COZAAR) 25 MG tablet, Take 1 tablet (25 mg total) by mouth daily., Disp: 30 tablet, Rfl: 11 .  melatonin 3 MG TABS tablet, Take 6 mg by mouth at bedtime., Disp: , Rfl:  .  metoprolol succinate (TOPROL-XL) 25 MG 24 hr tablet, Take 1 tablet (25 mg total) by mouth daily., Disp: 30 tablet, Rfl: 11 .  montelukast (SINGULAIR) 10 MG tablet, Take 10 mg by mouth at bedtime. , Disp: , Rfl: 3 .  Multiple Minerals (CALCIUM-MAGNESIUM-ZINC) TABS, Take 1-2 tablets by mouth daily. (Patient not taking: Reported on 11/16/2019), Disp: , Rfl:  .  nitroGLYCERIN (NITROSTAT) 0.4 MG SL tablet, Place 1 tablet (0.4 mg total) under the tongue every 5 (five) minutes as needed for chest pain., Disp: 25 tablet, Rfl: 12 .  pantoprazole (PROTONIX) 40 MG tablet, Take 1 tablet (40 mg total) by mouth daily., Disp: 30 tablet, Rfl: 11 .  psyllium (REGULOID) 0.52 g capsule, Take 1.5 g by mouth daily., Disp: , Rfl:  .  ticagrelor (BRILINTA) 90 MG TABS tablet, Take 1 tablet (90 mg total) by mouth 2 (two) times daily., Disp: 180 tablet, Rfl: 3  Past Medical History: Past Medical History:  Diagnosis Date  . Allergy   . Asthma   . Chronic combined systolic and diastolic CHF 03/88/8280   Ischemic CM // Echocardiogram 7/21: EF 40-45, Gr 2 DD, mid to  apical ant/ant-sept AK, RVSP 23.4, trivial MR  . Coronary artery disease    NSTEMI 7/21: oLAD 30, pLAD 99 >> PCI: DES; D1 90 >> PCI: POBA, prox and mid RCA 15, EF 35  . Diverticulitis   . GERD (gastroesophageal reflux disease)   . Hyperlipidemia 10/15/2019   Zetia DC'd during admit for NSTEMI 7/21 >> ? to Atorvastatin  . Ischemic cardiomyopathy 10/15/2019  . Non-ST elevation (NSTEMI) MI 10/13/2019   PCI:  DES to LAD and POBA to D1    Tobacco Use: Social History   Tobacco Use  Smoking Status Never Smoker  Smokeless Tobacco Never Used    Labs: Recent Review Scientist, physiological    Labs for ITP Cardiac and Pulmonary Rehab  Latest Ref Rng & Units 10/13/2019   Cholestrol 0 - 200 mg/dL 176   LDLCALC 0 - 99 mg/dL 96   HDL >40 mg/dL 48   Trlycerides <150 mg/dL 161(H)   Hemoglobin A1c 4.8 - 5.6 % 5.6      Capillary Blood Glucose: No results found for: GLUCAP   Exercise Target Goals: Exercise Program Goal: Individual exercise prescription set using results from initial 6 min walk test and THRR while considering  patient's activity barriers and safety.   Exercise Prescription Goal: Initial exercise prescription builds to 30-45 minutes a day of aerobic activity, 2-3 days per week.  Home exercise guidelines will be given to patient during program as part of exercise prescription that the participant will acknowledge.  Activity Barriers & Risk Stratification:  Activity Barriers & Cardiac Risk Stratification - 11/22/19 1545      Activity Barriers & Cardiac Risk Stratification   Activity Barriers Joint Problems;Other (comment)    Comments R knee osteoarthritis, Left hastring knee injury    Cardiac Risk Stratification High           6 Minute Walk:  6 Minute Walk    Row Name 11/22/19 1514         6 Minute Walk   Phase Initial     Distance 1428 feet     Walk Time 6 minutes     # of Rest Breaks 0     MPH 2.7     METS 2.52     RPE 13     Perceived Dyspnea  1     VO2 Peak 8.82     Symptoms Yes (comment)     Comments SOB due to mask, RPD =1.  Pt c/o indigestion in left upper back # 2 on 0-10 scale. EKG ordered.     Resting HR 79 bpm     Resting BP 104/72     Resting Oxygen Saturation  98 %     Exercise Oxygen Saturation  during 6 min walk 97 %     Max Ex. HR 91 bpm     Max Ex. BP 132/68     2 Minute Post BP 116/66            Oxygen Initial Assessment:   Oxygen Re-Evaluation:   Oxygen Discharge (Final Oxygen Re-Evaluation):   Initial Exercise Prescription:  Initial Exercise Prescription - 11/22/19 1500      Date of Initial Exercise RX and Referring Provider   Date 11/22/19     Referring Provider Lauree Chandler MD    Expected Discharge Date 01/20/20      NuStep   Level 1    SPM 75    Minutes 25    METs 1.5  Prescription Details   Frequency (times per week) 3    Duration Progress to 30 minutes of continuous aerobic without signs/symptoms of physical distress      Intensity   THRR 40-80% of Max Heartrate 61-122    Ratings of Perceived Exertion 11-13    Perceived Dyspnea 0-4      Progression   Progression Continue progressive overload as per policy without signs/symptoms or physical distress.      Resistance Training   Training Prescription Yes    Weight 2 lbs    Reps 10-15           Perform Capillary Blood Glucose checks as needed.  Exercise Prescription Changes:   Exercise Prescription Changes    Row Name 11/28/19 1440             Response to Exercise   Blood Pressure (Admit) 110/72       Blood Pressure (Exercise) 114/82       Blood Pressure (Exit) 110/60       Heart Rate (Admit) 88 bpm       Heart Rate (Exercise) 90 bpm       Heart Rate (Exit) 68 bpm       Rating of Perceived Exertion (Exercise) 11       Symptoms None       Comments Pt's first day of exercise in the CRP2 program.       Duration Progress to 30 minutes of  aerobic without signs/symptoms of physical distress       Intensity THRR unchanged         Progression   Progression Continue to progress workloads to maintain intensity without signs/symptoms of physical distress.       Average METs 1.6         Resistance Training   Training Prescription Yes       Weight 2 lbs       Reps 10-15       Time 10 Minutes         Interval Training   Interval Training No         NuStep   Level 1       SPM 75       Minutes 25       METs 1.6              Exercise Comments:   Exercise Comments    Row Name 11/28/19 1448           Exercise Comments Pt's first day of exercise in the CRP2 program. Pt tolerated exercise well with no complaints.               Exercise Goals and Review:   Exercise Goals    Row Name 11/22/19 1440             Exercise Goals   Increase Physical Activity Yes       Intervention Provide advice, education, support and counseling about physical activity/exercise needs.;Develop an individualized exercise prescription for aerobic and resistive training based on initial evaluation findings, risk stratification, comorbidities and participant's personal goals.       Expected Outcomes Short Term: Attend rehab on a regular basis to increase amount of physical activity.;Long Term: Add in home exercise to make exercise part of routine and to increase amount of physical activity.;Long Term: Exercising regularly at least 3-5 days a week.       Increase Strength and Stamina Yes  Intervention Provide advice, education, support and counseling about physical activity/exercise needs.;Develop an individualized exercise prescription for aerobic and resistive training based on initial evaluation findings, risk stratification, comorbidities and participant's personal goals.       Expected Outcomes Short Term: Increase workloads from initial exercise prescription for resistance, speed, and METs.;Short Term: Perform resistance training exercises routinely during rehab and add in resistance training at home;Long Term: Improve cardiorespiratory fitness, muscular endurance and strength as measured by increased METs and functional capacity (6MWT)       Able to understand and use rate of perceived exertion (RPE) scale Yes       Intervention Provide education and explanation on how to use RPE scale       Expected Outcomes Short Term: Able to use RPE daily in rehab to express subjective intensity level;Long Term:  Able to use RPE to guide intensity level when exercising independently       Knowledge and understanding of Target Heart Rate Range (THRR) Yes       Intervention Provide education and explanation of THRR including how the numbers were  predicted and where they are located for reference       Expected Outcomes Short Term: Able to state/look up THRR;Short Term: Able to use daily as guideline for intensity in rehab;Long Term: Able to use THRR to govern intensity when exercising independently       Able to check pulse independently Yes       Intervention Provide education and demonstration on how to check pulse in carotid and radial arteries.;Review the importance of being able to check your own pulse for safety during independent exercise       Expected Outcomes Short Term: Able to explain why pulse checking is important during independent exercise;Long Term: Able to check pulse independently and accurately       Understanding of Exercise Prescription Yes       Intervention Provide education, explanation, and written materials on patient's individual exercise prescription       Expected Outcomes Short Term: Able to explain program exercise prescription;Long Term: Able to explain home exercise prescription to exercise independently              Exercise Goals Re-Evaluation :  Exercise Goals Re-Evaluation    Row Name 11/28/19 1446             Exercise Goal Re-Evaluation   Exercise Goals Review Increase Physical Activity;Increase Strength and Stamina;Able to understand and use rate of perceived exertion (RPE) scale;Knowledge and understanding of Target Heart Rate Range (THRR);Able to check pulse independently;Understanding of Exercise Prescription       Comments Pt's first day of exercise in the CRP2 program. Pt tolerated exercise well with no complaints and verbalizes understnading of Exercise Rx, RPE scale, and THR.       Expected Outcomes Will continue to monitor patient and progress workloads as tolerated.              Discharge Exercise Prescription (Final Exercise Prescription Changes):  Exercise Prescription Changes - 11/28/19 1440      Response to Exercise   Blood Pressure (Admit) 110/72    Blood Pressure  (Exercise) 114/82    Blood Pressure (Exit) 110/60    Heart Rate (Admit) 88 bpm    Heart Rate (Exercise) 90 bpm    Heart Rate (Exit) 68 bpm    Rating of Perceived Exertion (Exercise) 11    Symptoms None    Comments Pt's first day of exercise  in the CRP2 program.    Duration Progress to 30 minutes of  aerobic without signs/symptoms of physical distress    Intensity THRR unchanged      Progression   Progression Continue to progress workloads to maintain intensity without signs/symptoms of physical distress.    Average METs 1.6      Resistance Training   Training Prescription Yes    Weight 2 lbs    Reps 10-15    Time 10 Minutes      Interval Training   Interval Training No      NuStep   Level 1    SPM 75    Minutes 25    METs 1.6           Nutrition:  Target Goals: Understanding of nutrition guidelines, daily intake of sodium 1500mg , cholesterol 200mg , calories 30% from fat and 7% or less from saturated fats, daily to have 5 or more servings of fruits and vegetables.  Biometrics:  Pre Biometrics - 11/22/19 1330      Pre Biometrics   Waist Circumference 44 inches    Hip Circumference 53.25 inches    Waist to Hip Ratio 0.83 %    Triceps Skinfold 32 mm    % Body Fat 49.6 %    Grip Strength 32 kg    Flexibility 12 in    Single Leg Stand 29.85 seconds            Nutrition Therapy Plan and Nutrition Goals:  Nutrition Therapy & Goals - 12/07/19 1453      Nutrition Therapy   Diet Heart Healthy    Drug/Food Interactions Statins/Certain Fruits      Personal Nutrition Goals   Nutrition Goal Pt to build a healthy plate including vegetables, fruits, whole grains, and low-fat dairy products in a heart healthy meal plan.    Personal Goal #2 Pt to identify food quantities necessary to achieve weight loss of 6-24 lb at graduation from cardiac rehab.      Intervention Plan   Intervention Prescribe, educate and counsel regarding individualized specific dietary  modifications aiming towards targeted core components such as weight, hypertension, lipid management, diabetes, heart failure and other comorbidities.    Expected Outcomes Short Term Goal: Understand basic principles of dietary content, such as calories, fat, sodium, cholesterol and nutrients.           Nutrition Assessments:   Nutrition Goals Re-Evaluation:  Nutrition Goals Re-Evaluation    [place] Name 12/07/19 1454             Goals   Current Weight 215 lb (97.5 kg)       Nutrition Goal Pt to build a healthy plate including vegetables, fruits, whole grains, and low-fat dairy products in a heart healthy meal plan.         Personal Goal #2 Re-Evaluation   Personal Goal #2 Pt to identify food quantities necessary to achieve weight loss of 6-24 lb at graduation from cardiac rehab.              Nutrition Goals Re-Evaluation:  Nutrition Goals Re-Evaluation    [place] Name 12/07/19 1454             Goals   Current Weight 215 lb (97.5 kg)       Nutrition Goal Pt to build a healthy plate including vegetables, fruits, whole grains, and low-fat dairy products in a heart healthy meal plan.         Personal Goal #2  Re-Evaluation   Personal Goal #2 Pt to identify food quantities necessary to achieve weight loss of 6-24 lb at graduation from cardiac rehab.              Nutrition Goals Discharge (Final Nutrition Goals Re-Evaluation):  Nutrition Goals Re-Evaluation - 12/07/19 1454      Goals   Current Weight 215 lb (97.5 kg)    Nutrition Goal Pt to build a healthy plate including vegetables, fruits, whole grains, and low-fat dairy products in a heart healthy meal plan.      Personal Goal #2 Re-Evaluation   Personal Goal #2 Pt to identify food quantities necessary to achieve weight loss of 6-24 lb at graduation from cardiac rehab.           Psychosocial: Target Goals: Acknowledge presence or absence of significant depression and/or stress, maximize coping skills, provide  positive support system. Participant is able to verbalize types and ability to use techniques and skills needed for reducing stress and depression.  Initial Review & Psychosocial Screening:  Initial Psych Review & Screening - 11/22/19 1346      Initial Review   Current issues with Current Anxiety/Panic   Bette says she has had some anxiety since her hospitalization     Family Dynamics   Good Support System? Yes   Bette lives alone but has a good support network of friends and neighbors who live nearby. Bette's children live out of the area     Barriers   Psychosocial barriers to participate in program The patient should benefit from training in stress management and relaxation.      Screening Interventions   Interventions Encouraged to exercise;To provide support and resources with identified psychosocial needs    Expected Outcomes Long Term Goal: Stressors or current issues are controlled or eliminated.;Short Term goal: Identification and review with participant of any Quality of Life or Depression concerns found by scoring the questionnaire.           Quality of Life Scores:  Quality of Life - 11/22/19 1519      Quality of Life   Select Quality of Life      Quality of Life Scores   Health/Function Pre 27 %    Socioeconomic Pre 30 %    Psych/Spiritual Pre 30 %    Family Pre 30 %    GLOBAL Pre 28.69 %          Scores of 19 and below usually indicate a poorer quality of life in these areas.  A difference of  2-3 points is a clinically meaningful difference.  A difference of 2-3 points in the total score of the Quality of Life Index has been associated with significant improvement in overall quality of life, self-image, physical symptoms, and general health in studies assessing change in quality of life.  PHQ-9: Recent Review Flowsheet Data    Depression screen [facility] 2/9 11/22/2019   Decreased Interest 0   Down, Depressed, Hopeless 0   PHQ - 2 Score 0     Interpretation of  Total Score  Total Score Depression Severity:  1-4 = Minimal depression, 5-9 = Mild depression, 10-14 = Moderate depression, 15-19 = Moderately severe depression, 20-27 = Severe depression   Psychosocial Evaluation and Intervention:  Psychosocial Evaluation - 11/29/19 1132      Psychosocial Evaluation & Interventions   Interventions Encouraged to exercise with the program and follow exercise prescription    Comments Ms. Scheunemann admits to current anxiety over her  health. She has a strong support system and a positive attitude and outlook. Her hobbies include reading, crafts, geneology, and sewing. These hobbies are utilized for stress and anxiety reduction.    Expected Outcomes Ms. Lewing will continue to have a positive attitue and outlook. She will utilize her hobbies and support system for anxiety and stress reduction.    Continue Psychosocial Services  Follow up required by staff           Psychosocial Re-Evaluation:  Psychosocial Re-Evaluation    [place] Name 12/06/19 1401             Psychosocial Re-Evaluation   Current issues with Current Anxiety/Panic       Comments Ms. Benedict admits to current anxiety over her health. She has a strong support system and a positive attitude and outlook. Her hobbies include reading, crafts, geneology, and sewing. These hobbies are utilized for stress and anxiety reduction.       Expected Outcomes Ms. Smay will continue to have a positive attitue and outlook. She will utilize her hobbies and support system for anxiety and stress reduction.       Interventions Encouraged to attend Cardiac Rehabilitation for the exercise       Continue Psychosocial Services  Follow up required by staff              Psychosocial Discharge (Final Psychosocial Re-Evaluation):  Psychosocial Re-Evaluation - 12/06/19 1401      Psychosocial Re-Evaluation   Current issues with Current Anxiety/Panic    Comments Ms. Huisman admits to current anxiety over her  health. She has a strong support system and a positive attitude and outlook. Her hobbies include reading, crafts, geneology, and sewing. These hobbies are utilized for stress and anxiety reduction.    Expected Outcomes Ms. Cupo will continue to have a positive attitue and outlook. She will utilize her hobbies and support system for anxiety and stress reduction.    Interventions Encouraged to attend Cardiac Rehabilitation for the exercise    Continue Psychosocial Services  Follow up required by staff           Vocational Rehabilitation: Provide vocational rehab assistance to qualifying candidates.   Vocational Rehab Evaluation & Intervention:  Vocational Rehab - 11/22/19 1550      Initial Vocational Rehab Evaluation & Intervention   Assessment shows need for Vocational Rehabilitation No   Otilio Saber is retired and does not need vocational rehab at this time.          Education: Education Goals: Education classes will be provided on a weekly basis, covering required topics. Participant will state understanding/return demonstration of topics presented.  Learning Barriers/Preferences:  Learning Barriers/Preferences - 11/22/19 1520      Learning Barriers/Preferences   Learning Barriers None    Learning Preferences Written Material;Audio;Verbal Instruction;Computer/Internet           Education Topics: Count Your Pulse:  -Group instruction provided by verbal instruction, demonstration, patient participation and written materials to support subject.  Instructors address importance of being able to find your pulse and how to count your pulse when at home without a heart monitor.  Patients get hands on experience counting their pulse with staff help and individually.   Heart Attack, Angina, and Risk Factor Modification:  -Group instruction provided by verbal instruction, video, and written materials to support subject.  Instructors address signs and symptoms of angina and heart attacks.     Also discuss risk factors for heart disease and how to make  changes to improve heart health risk factors.   Functional Fitness:  -Group instruction provided by verbal instruction, demonstration, patient participation, and written materials to support subject.  Instructors address safety measures for doing things around the house.  Discuss how to get up and down off the floor, how to pick things up properly, how to safely get out of a chair without assistance, and balance training.   Meditation and Mindfulness:  -Group instruction provided by verbal instruction, patient participation, and written materials to support subject.  Instructor addresses importance of mindfulness and meditation practice to help reduce stress and improve awareness.  Instructor also leads participants through a meditation exercise.    Stretching for Flexibility and Mobility:  -Group instruction provided by verbal instruction, patient participation, and written materials to support subject.  Instructors lead participants through series of stretches that are designed to increase flexibility thus improving mobility.  These stretches are additional exercise for major muscle groups that are typically performed during regular warm up and cool down.   Hands Only CPR:  -Group verbal, video, and participation provides a basic overview of AHA guidelines for community CPR. Role-play of emergencies allow participants the opportunity to practice calling for help and chest compression technique with discussion of AED use.   Hypertension: -Group verbal and written instruction that provides a basic overview of hypertension including the most recent diagnostic guidelines, risk factor reduction with self-care instructions and medication management.    Nutrition I class: Heart Healthy Eating:  -Group instruction provided by PowerPoint slides, verbal discussion, and written materials to support subject matter. The instructor gives an  explanation and review of the Therapeutic Lifestyle Changes diet recommendations, which includes a discussion on lipid goals, dietary fat, sodium, fiber, plant stanol/sterol esters, sugar, and the components of a well-balanced, healthy diet.   Nutrition II class: Lifestyle Skills:  -Group instruction provided by PowerPoint slides, verbal discussion, and written materials to support subject matter. The instructor gives an explanation and review of label reading, grocery shopping for heart health, heart healthy recipe modifications, and ways to make healthier choices when eating out.   Diabetes Question & Answer:  -Group instruction provided by PowerPoint slides, verbal discussion, and written materials to support subject matter. The instructor gives an explanation and review of diabetes co-morbidities, pre- and post-prandial blood glucose goals, pre-exercise blood glucose goals, signs, symptoms, and treatment of hypoglycemia and hyperglycemia, and foot care basics.   Diabetes Blitz:  -Group instruction provided by PowerPoint slides, verbal discussion, and written materials to support subject matter. The instructor gives an explanation and review of the physiology behind type 1 and type 2 diabetes, diabetes medications and rational behind using different medications, pre- and post-prandial blood glucose recommendations and Hemoglobin A1c goals, diabetes diet, and exercise including blood glucose guidelines for exercising safely.    Portion Distortion:  -Group instruction provided by PowerPoint slides, verbal discussion, written materials, and food models to support subject matter. The instructor gives an explanation of serving size versus portion size, changes in portions sizes over the last 20 years, and what consists of a serving from each food group.   Stress Management:  -Group instruction provided by verbal instruction, video, and written materials to support subject matter.  Instructors  review role of stress in heart disease and how to cope with stress positively.     Exercising on Your Own:  -Group instruction provided by verbal instruction, power point, and written materials to support subject.  Instructors discuss benefits of exercise, components of  exercise, frequency and intensity of exercise, and end points for exercise.  Also discuss use of nitroglycerin and activating EMS.  Review options of places to exercise outside of rehab.  Review guidelines for sex with heart disease.   Cardiac Drugs I:  -Group instruction provided by verbal instruction and written materials to support subject.  Instructor reviews cardiac drug classes: antiplatelets, anticoagulants, beta blockers, and statins.  Instructor discusses reasons, side effects, and lifestyle considerations for each drug class.   Cardiac Drugs II:  -Group instruction provided by verbal instruction and written materials to support subject.  Instructor reviews cardiac drug classes: angiotensin converting enzyme inhibitors (ACE-I), angiotensin II receptor blockers (ARBs), nitrates, and calcium channel blockers.  Instructor discusses reasons, side effects, and lifestyle considerations for each drug class.   Anatomy and Physiology of the Circulatory System:  Group verbal and written instruction and models provide basic cardiac anatomy and physiology, with the coronary electrical and arterial systems. Review of: AMI, Angina, Valve disease, Heart Failure, Peripheral Artery Disease, Cardiac Arrhythmia, Pacemakers, and the ICD.   Other Education:  -Group or individual verbal, written, or video instructions that support the educational goals of the cardiac rehab program.   Holiday Eating Survival Tips:  -Group instruction provided by PowerPoint slides, verbal discussion, and written materials to support subject matter. The instructor gives patients tips, tricks, and techniques to help them not only survive but enjoy the holidays  despite the onslaught of food that accompanies the holidays.   Knowledge Questionnaire Score:  Knowledge Questionnaire Score - 11/22/19 1522      Knowledge Questionnaire Score   Pre Score 19/24           Core Components/Risk Factors/Patient Goals at Admission:  Personal Goals and Risk Factors at Admission - 11/22/19 1552      Core Components/Risk Factors/Patient Goals on Admission    Weight Management Yes;Obesity;Weight Loss    Admit Weight 215 lb 2.7 oz (97.6 kg)    Expected Outcomes Short Term: Continue to assess and modify interventions until short term weight is achieved;Long Term: Adherence to nutrition and physical activity/exercise program aimed toward attainment of established weight goal;Weight Maintenance: Understanding of the daily nutrition guidelines, which includes 25-35% calories from fat, 7% or less cal from saturated fats, less than 200mg  cholesterol, less than 1.5gm of sodium, & 5 or more servings of fruits and vegetables daily;Weight Loss: Understanding of general recommendations for a balanced deficit meal plan, which promotes 1-2 lb weight loss per week and includes a negative energy balance of [phone] kcal/d;Understanding recommendations for meals to include 15-35% energy as protein, 25-35% energy from fat, 35-60% energy from carbohydrates, less than 200mg  of dietary cholesterol, 20-35 gm of total fiber daily;Understanding of distribution of calorie intake throughout the day with the consumption of 4-5 meals/snacks    Lipids Yes    Intervention Provide education and support for participant on nutrition & aerobic/resistive exercise along with prescribed medications to achieve LDL 70mg , HDL >40mg .    Expected Outcomes Short Term: Participant states understanding of desired cholesterol values and is compliant with medications prescribed. Participant is following exercise prescription and nutrition guidelines.;Long Term: Cholesterol controlled with medications as prescribed,  with individualized exercise RX and with personalized nutrition plan. Value goals: LDL < 70mg , HDL > 40 mg.    Stress Yes    Intervention Offer individual and/or small group education and counseling on adjustment to heart disease, stress management and health-related lifestyle change. Teach and support self-help strategies.;Refer participants experiencing significant psychosocial distress  to appropriate mental health specialists for further evaluation and treatment. When possible, include family members and significant others in education/counseling sessions.    Expected Outcomes Short Term: Participant demonstrates changes in health-related behavior, relaxation and other stress management skills, ability to obtain effective social support, and compliance with psychotropic medications if prescribed.;Long Term: Emotional wellbeing is indicated by absence of clinically significant psychosocial distress or social isolation.           Core Components/Risk Factors/Patient Goals Review:   Goals and Risk Factor Review    Row Name 11/29/19 1136 12/06/19 1402           Core Components/Risk Factors/Patient Goals Review   Personal Goals Review Weight Management/Obesity;Lipids;Stress Weight Management/Obesity;Lipids;Stress      Review Ms. Shevlin has multiple CAD risk factors. She is eager to participate in CR for risk factor reduction. Her personal goals are to "get back her stamina and strength" and to gain confidence in her health. Ms. Paterson has multiple CAD risk factors. She is eager to participate in CR for risk factor reduction. Her personal goals are to "get back her stamina and strength" and to gain confidence in her health.      Expected Outcomes Patient will continue to participate in CR for risk factor reduction. Patient will continue to participate in CR for risk factor reduction.             Core Components/Risk Factors/Patient Goals at Discharge (Final Review):   Goals and Risk Factor  Review - 12/06/19 1402      Core Components/Risk Factors/Patient Goals Review   Personal Goals Review Weight Management/Obesity;Lipids;Stress    Review Ms. Cude has multiple CAD risk factors. She is eager to participate in CR for risk factor reduction. Her personal goals are to "get back her stamina and strength" and to gain confidence in her health.    Expected Outcomes Patient will continue to participate in CR for risk factor reduction.           ITP Comments:  ITP Comments    Row Name 11/22/19 1340 11/29/19 1129 12/06/19 1358       ITP Comments Dr Fransico Him MD, Medical Director Ms. Kreager completed her first cardiac rehab exercise session today and tolerated well. VSS. Denied complaints. Worked to an RPE of 11 during 30 min aerobic exerice on the [employer]. Will continue to monitor as patient progresses in her exercise prescription. 30 day ITP review: Ms. Utecht has completed 3 cardiac rehab exercise sessions since admission. She is tolerating her exercise well. Denies complaints. Works to an RPE of 11-13. She is self motivated to improve her health and requested to change her exercise prescription to including walking the track as walking is difficult for her. Her personal goals are to gain confidence in her health and to "get back" her stamina. Will continue to monitor progression towards goals over the next 30 days.            Comments: see ITP comments

## 2019-12-07 ENCOUNTER — Encounter (HOSPITAL_COMMUNITY)
Admission: RE | Admit: 2019-12-07 | Discharge: 2019-12-07 | Disposition: A | Discharge status: Home / Self Care | Payer: Medicare HMO | Source: Ambulatory Visit | Attending: Cardiovascular Disease | Admitting: Cardiovascular Disease

## 2019-12-07 ENCOUNTER — Other Ambulatory Visit: Payer: Self-pay

## 2019-12-07 VITALS — Wt 215.0 lb

## 2019-12-07 DIAGNOSIS — I5042 Chronic combined systolic (congestive) and diastolic (congestive) heart failure: Secondary | ICD-10-CM | POA: Diagnosis not present

## 2019-12-07 DIAGNOSIS — Z955 Presence of coronary angioplasty implant and graft: Secondary | ICD-10-CM

## 2019-12-07 DIAGNOSIS — I214 Non-ST elevation (NSTEMI) myocardial infarction: Secondary | ICD-10-CM

## 2019-12-07 NOTE — Progress Notes (Signed)
Valerie Fields 69 y.o. female Nutrition Note  Visit Diagnosis: NSTEMI (non-ST elevated myocardial infarction) ([place]) 10/13/19  S/P DES mLAD 10/13/19  Past Medical History:  Diagnosis Date  . Allergy   . Asthma   . Chronic combined systolic and diastolic CHF 16/96/7893   Ischemic CM // Echocardiogram 7/21: EF 40-45, Gr 2 DD, mid to apical ant/ant-sept AK, RVSP 23.4, trivial MR  . Coronary artery disease    NSTEMI 7/21: oLAD 30, pLAD 99 >> PCI: DES; D1 90 >> PCI: POBA, prox and mid RCA 15, EF 35  . Diverticulitis   . GERD (gastroesophageal reflux disease)   . Hyperlipidemia 10/15/2019   Zetia DC'd during admit for NSTEMI 7/21 >> ? to Atorvastatin  . Ischemic cardiomyopathy 10/15/2019  . Non-ST elevation (NSTEMI) MI 10/13/2019   PCI:  DES to LAD and POBA to D1     Medications reviewed.   Current Outpatient Medications:  .  acetaminophen (TYLENOL) 325 MG tablet, Take 2 tablets (650 mg total) by mouth every 4 (four) hours as needed for mild pain, moderate pain or headache., Disp: , Rfl:  .  aspirin 81 MG chewable tablet, Chew 1 tablet (81 mg total) by mouth daily., Disp: , Rfl:  .  atorvastatin (LIPITOR) 80 MG tablet, Take 1 tablet (80 mg total) by mouth daily., Disp: 30 tablet, Rfl: 11 .  azelastine (ASTELIN) 0.1 % nasal spray, Place 1 spray into both nostrils daily. Use in each nostril as directed , Disp: , Rfl:  .  cetirizine (ZYRTEC) 10 MG tablet, Take 10 mg by mouth daily., Disp: , Rfl:  .  cholecalciferol (VITAMIN D) 1000 units tablet, Take 2,000 Units daily by mouth. (Patient not taking: Reported on 11/16/2019), Disp: , Rfl:  .  Coenzyme Q10 (CO Q 10 PO), Take 1 capsule by mouth daily. (Patient not taking: Reported on 11/16/2019), Disp: , Rfl:  .  dicyclomine (BENTYL) 10 MG capsule, Take 1 tablet by mouth every 6 hours as needed. (Patient taking differently: Take 10 mg by mouth every 6 (six) hours as needed for spasms. ), Disp: 15 capsule, Rfl: 2 .  diphenhydrAMINE (SLEEP AID,  DIPHENHYDRAMINE,) 25 MG tablet, Take 25 mg by mouth at bedtime as needed for sleep., Disp: , Rfl:  .  docusate sodium (STOOL SOFTENER) 100 MG capsule, Take 100 mg by mouth daily. , Disp: , Rfl:  .  losartan (COZAAR) 25 MG tablet, Take 1 tablet (25 mg total) by mouth daily., Disp: 30 tablet, Rfl: 11 .  melatonin 3 MG TABS tablet, Take 6 mg by mouth at bedtime., Disp: , Rfl:  .  metoprolol succinate (TOPROL-XL) 25 MG 24 hr tablet, Take 1 tablet (25 mg total) by mouth daily., Disp: 30 tablet, Rfl: 11 .  montelukast (SINGULAIR) 10 MG tablet, Take 10 mg by mouth at bedtime. , Disp: , Rfl: 3 .  Multiple Minerals (CALCIUM-MAGNESIUM-ZINC) TABS, Take 1-2 tablets by mouth daily. (Patient not taking: Reported on 11/16/2019), Disp: , Rfl:  .  nitroGLYCERIN (NITROSTAT) 0.4 MG SL tablet, Place 1 tablet (0.4 mg total) under the tongue every 5 (five) minutes as needed for chest pain., Disp: 25 tablet, Rfl: 12 .  pantoprazole (PROTONIX) 40 MG tablet, Take 1 tablet (40 mg total) by mouth daily., Disp: 30 tablet, Rfl: 11 .  psyllium (REGULOID) 0.52 g capsule, Take 1.5 g by mouth daily., Disp: , Rfl:  .  ticagrelor (BRILINTA) 90 MG TABS tablet, Take 1 tablet (90 mg total) by mouth 2 (two) times  daily., Disp: 180 tablet, Rfl: 3   Ht Readings from Last 1 Encounters:  11/22/19 5\' 2"  (1.575 m)     Wt Readings from Last 3 Encounters:  11/22/19 215 lb 2.7 oz (97.6 kg)  10/25/19 (!) 214 lb (97.1 kg)  10/15/19 215 lb 11.2 oz (97.8 kg)     There is no height or weight on file to calculate BMI.   Social History   Tobacco Use  Smoking Status Never Smoker  Smokeless Tobacco Never Used     Lab Results  Component Value Date   CHOL 176 10/13/2019   Lab Results  Component Value Date   HDL 48 10/13/2019   Lab Results  Component Value Date   LDLCALC 96 10/13/2019   Lab Results  Component Value Date   TRIG 161 (H) 10/13/2019     Lab Results  Component Value Date   HGBA1C 5.6 10/13/2019     CBG (last  3)  No results for input(s): GLUCAP in the last 72 hours.   Nutrition Note  Spoke with pt. Nutrition Plan and Nutrition Survey goals reviewed with pt. Pt is following a Heart Healthy diet.  Pt exercised and ate a healthy diet prior to MI. She has been reducing sodium since MI. She has done weight watchers and other diets in the past.  Reading labels, avoiding salt shaker, eating out 1-2 times per week. Cooking at home most of the time.  She wants to continue working on heart healthy changes. She states goal of weight loss. She has lost 9 lbs in past 6 weeks.   Pt expressed understanding of the information reviewed.    Nutrition Diagnosis Obese  II = 35-39.9 related to excessive energy intake as evidenced by a BMI 39.32 kg/m2  Nutrition Intervention ? Pt's individual nutrition plan reviewed with pt. ? Benefits of adopting Heart Healthy diet discussed when Medficts reviewed.   ? Continue client-centered nutrition education by RD, as part of interdisciplinary care.  Goal(s) ? Pt to identify food quantities necessary to achieve weight loss of 6-24 lb at graduation from cardiac rehab.  ? Pt to build a healthy plate including vegetables, fruits, whole grains, and low-fat dairy products in a heart healthy meal plan. Plan:   Will provide client-centered nutrition education as part of interdisciplinary care  Monitor and evaluate progress toward nutrition goal with team.   Michaele Offer, MS, RDN, LDN

## 2019-12-08 ENCOUNTER — Telehealth (HOSPITAL_COMMUNITY): Payer: Self-pay | Admitting: Family Medicine

## 2019-12-09 ENCOUNTER — Encounter (HOSPITAL_COMMUNITY): Payer: Medicare HMO

## 2019-12-09 ENCOUNTER — Other Ambulatory Visit: Payer: Self-pay | Admitting: Critical Care Medicine

## 2019-12-09 ENCOUNTER — Other Ambulatory Visit: Payer: Medicare HMO

## 2019-12-09 DIAGNOSIS — Z20822 Contact with and (suspected) exposure to covid-19: Secondary | ICD-10-CM

## 2019-12-12 ENCOUNTER — Encounter (HOSPITAL_COMMUNITY): Payer: Medicare HMO

## 2019-12-12 LAB — NOVEL CORONAVIRUS, NAA: SARS-CoV-2, NAA: NOT DETECTED

## 2019-12-13 ENCOUNTER — Telehealth (HOSPITAL_COMMUNITY): Payer: Self-pay | Admitting: Family Medicine

## 2019-12-14 ENCOUNTER — Encounter (HOSPITAL_COMMUNITY)
Admission: RE | Admit: 2019-12-14 | Discharge: 2019-12-14 | Disposition: A | Discharge status: Home / Self Care | Payer: Medicare HMO | Source: Ambulatory Visit | Attending: Cardiovascular Disease | Admitting: Cardiovascular Disease

## 2019-12-14 ENCOUNTER — Other Ambulatory Visit: Payer: Self-pay

## 2019-12-14 DIAGNOSIS — Z955 Presence of coronary angioplasty implant and graft: Secondary | ICD-10-CM

## 2019-12-14 DIAGNOSIS — I5042 Chronic combined systolic (congestive) and diastolic (congestive) heart failure: Secondary | ICD-10-CM | POA: Diagnosis not present

## 2019-12-14 DIAGNOSIS — I214 Non-ST elevation (NSTEMI) myocardial infarction: Secondary | ICD-10-CM

## 2019-12-16 ENCOUNTER — Encounter (HOSPITAL_COMMUNITY)
Admission: RE | Admit: 2019-12-16 | Discharge: 2019-12-16 | Disposition: A | Discharge status: Home / Self Care | Payer: Medicare HMO | Source: Ambulatory Visit | Attending: Cardiovascular Disease | Admitting: Cardiovascular Disease

## 2019-12-16 ENCOUNTER — Other Ambulatory Visit: Payer: Self-pay

## 2019-12-16 DIAGNOSIS — I5042 Chronic combined systolic (congestive) and diastolic (congestive) heart failure: Secondary | ICD-10-CM

## 2019-12-16 DIAGNOSIS — Z955 Presence of coronary angioplasty implant and graft: Secondary | ICD-10-CM

## 2019-12-16 DIAGNOSIS — I214 Non-ST elevation (NSTEMI) myocardial infarction: Secondary | ICD-10-CM

## 2019-12-19 ENCOUNTER — Encounter (HOSPITAL_COMMUNITY)
Admission: RE | Admit: 2019-12-19 | Discharge: 2019-12-19 | Disposition: A | Discharge status: Home / Self Care | Payer: Medicare HMO | Source: Ambulatory Visit | Attending: Cardiovascular Disease | Admitting: Cardiovascular Disease

## 2019-12-19 ENCOUNTER — Other Ambulatory Visit: Payer: Self-pay

## 2019-12-19 DIAGNOSIS — I5042 Chronic combined systolic (congestive) and diastolic (congestive) heart failure: Secondary | ICD-10-CM | POA: Diagnosis not present

## 2019-12-19 DIAGNOSIS — I214 Non-ST elevation (NSTEMI) myocardial infarction: Secondary | ICD-10-CM

## 2019-12-19 DIAGNOSIS — Z955 Presence of coronary angioplasty implant and graft: Secondary | ICD-10-CM

## 2019-12-19 NOTE — Progress Notes (Signed)
Reviewed home exercise prescription today with patient. Pt is currently doing chair yoga 2x/wk. Will dicussed adding aerobic exercise and she will add 2x/week, 30 minutes of walking in addition to her CRP2 program. Warm-up, cooling down, and stretching were encouraged as part of the walking routine. THRR of 61-132 and the concept of moderate aerobic exercise with RPE of 11-13 reviewed. Weather parameters for temperature and humidity reviewed and pt was encouraged to hydrate before, during and after activity. S/S that would require the patient to terminate activity discussed, as well as when to call 911 vs MD. Pt encouraged to carry her NTG and use and storage reviewed. Pt encouraged to carry cell phone if walking out doors. Pt verbalized understanding of home exercise Rx and was provided copy.   Valerie Rubenstein MS, ACSM-EP-C, CCRP

## 2019-12-21 ENCOUNTER — Other Ambulatory Visit: Payer: Self-pay

## 2019-12-21 ENCOUNTER — Encounter (HOSPITAL_COMMUNITY)
Admission: RE | Admit: 2019-12-21 | Discharge: 2019-12-21 | Disposition: A | Discharge status: Home / Self Care | Payer: Medicare HMO | Source: Ambulatory Visit | Attending: Cardiovascular Disease | Admitting: Cardiovascular Disease

## 2019-12-21 DIAGNOSIS — I214 Non-ST elevation (NSTEMI) myocardial infarction: Secondary | ICD-10-CM

## 2019-12-21 DIAGNOSIS — Z955 Presence of coronary angioplasty implant and graft: Secondary | ICD-10-CM

## 2019-12-21 DIAGNOSIS — I5042 Chronic combined systolic (congestive) and diastolic (congestive) heart failure: Secondary | ICD-10-CM

## 2019-12-23 ENCOUNTER — Encounter (HOSPITAL_COMMUNITY): Payer: Medicare HMO

## 2019-12-26 ENCOUNTER — Other Ambulatory Visit: Payer: Self-pay

## 2019-12-26 ENCOUNTER — Encounter (HOSPITAL_COMMUNITY)
Admission: RE | Admit: 2019-12-26 | Discharge: 2019-12-26 | Disposition: A | Discharge status: Home / Self Care | Payer: Medicare HMO | Source: Ambulatory Visit | Attending: Cardiovascular Disease | Admitting: Cardiovascular Disease

## 2019-12-26 DIAGNOSIS — I5042 Chronic combined systolic (congestive) and diastolic (congestive) heart failure: Secondary | ICD-10-CM | POA: Diagnosis not present

## 2019-12-26 DIAGNOSIS — I214 Non-ST elevation (NSTEMI) myocardial infarction: Secondary | ICD-10-CM

## 2019-12-26 DIAGNOSIS — Z955 Presence of coronary angioplasty implant and graft: Secondary | ICD-10-CM

## 2019-12-27 NOTE — Progress Notes (Signed)
Cardiology Office Note    Date:  12/28/2019   ID:  [name], [name] 1950-07-21, MRN [id number]  PCP:  Marda Stalker, PA-C  Cardiologist: Lauree Chandler, MD EPS: None  Chief Complaint  Patient presents with  . Follow-up    History of Present Illness:  Valerie Fields is a 69 y.o. female  with history of GERD and asthma who presented with stuttering chest pain for 2 days/NSTEMI 10/13/19 and underwent DES to the high-grade mid LAD involving diagonal 1 treated with POBA.  EF 35% at cath, 40 to 45% with moderate diastolic dysfunction on echo.  She received 2 doses of IV Lasix post cath.  Discharged on aspirin, Brilinta, atorvastatin, losartan, metoprolol.  Spironolactone was not started due to low BP can be considered as outpatient.   I saw the patient 09/2019 post hospital and she was doing well.  Blood pressure was too low to adjust her medications for cardiomyopathy.  Enrolled in cardiac rehab.  Was having trouble with indigestion and started on pantoprazole 40 mg daily by Dr. Angelena Form.  Patient comes in for f/u. Still having a lot of indigestion after eating. Think she has spasms of her gallbladder at times. No melena. Sees Dr. Fuller Plan but hasn't seen in awhile. Has severe sleep apnea-just had sleep study and got results. No chest pain, shortness of breath, palpitations, dizziness or presyncope. Still doing cardiac rehab.     Past Medical History:  Diagnosis Date  . Allergy   . Asthma   . Chronic combined systolic and diastolic CHF 50/35/4656   Ischemic CM // Echocardiogram 7/21: EF 40-45, Gr 2 DD, mid to apical ant/ant-sept AK, RVSP 23.4, trivial MR  . Coronary artery disease    NSTEMI 7/21: oLAD 30, pLAD 99 >> PCI: DES; D1 90 >> PCI: POBA, prox and mid RCA 15, EF 35  . Diverticulitis   . GERD (gastroesophageal reflux disease)   . Hyperlipidemia 10/15/2019   Zetia DC'd during admit for NSTEMI 7/21 >> ? to Atorvastatin  . Ischemic cardiomyopathy 10/15/2019  .  Non-ST elevation (NSTEMI) MI 10/13/2019   PCI:  DES to LAD and POBA to D1    Past Surgical History:  Procedure Laterality Date  . BREAST BIOPSY Left    No Scar seen   . BREAST CYST EXCISION Left    No scar seen   . CARDIAC CATHETERIZATION    . COLONOSCOPY    . CORONARY STENT INTERVENTION  10/13/2019  . CORONARY STENT INTERVENTION N/A 10/13/2019   Procedure: CORONARY STENT INTERVENTION;  Surgeon: Nelva Bush, MD;  Location: [facility];  Service: Cardiovascular;  Laterality: N/A;  mid lad  . LEFT HEART CATH AND CORONARY ANGIOGRAPHY N/A 10/13/2019   Procedure: LEFT HEART CATH AND CORONARY ANGIOGRAPHY;  Surgeon: Nelva Bush, MD;  Location: [facility];  Service: Cardiovascular;  Laterality: N/A;  . TONSILLECTOMY      Current Medications: Current Meds  Medication Sig  . acetaminophen (TYLENOL) 325 MG tablet Take 2 tablets (650 mg total) by mouth every 4 (four) hours as needed for mild pain, moderate pain or headache.  Marland Kitchen aspirin 81 MG chewable tablet Chew 1 tablet (81 mg total) by mouth daily.  Marland Kitchen atorvastatin (LIPITOR) 80 MG tablet Take 1 tablet (80 mg total) by mouth daily.  Marland Kitchen azelastine (ASTELIN) 0.1 % nasal spray Place 1 spray into both nostrils daily. Use in each nostril as directed   . cetirizine (ZYRTEC) 10 MG tablet Take 10 mg by  mouth daily.  . cholecalciferol (VITAMIN D) 1000 units tablet Take 2,000 Units by mouth daily.   . Coenzyme Q10 (CO Q 10 PO) Take 1 capsule by mouth daily.   Marland Kitchen dicyclomine (BENTYL) 10 MG capsule Take 1 tablet by mouth every 6 hours as needed. (Patient taking differently: Take 10 mg by mouth every 6 (six) hours as needed for spasms. )  . diphenhydrAMINE (SLEEP AID, DIPHENHYDRAMINE,) 25 MG tablet Take 25 mg by mouth at bedtime as needed for sleep.  Marland Kitchen docusate sodium (STOOL SOFTENER) 100 MG capsule Take 100 mg by mouth daily.   Marland Kitchen losartan (COZAAR) 25 MG tablet Take 1 tablet (25 mg total) by mouth daily.  . melatonin 3 MG TABS tablet  Take 6 mg by mouth at bedtime.  . metoprolol succinate (TOPROL-XL) 25 MG 24 hr tablet Take 1 tablet (25 mg total) by mouth daily.  . montelukast (SINGULAIR) 10 MG tablet Take 10 mg by mouth at bedtime.   . Multiple Minerals (CALCIUM-MAGNESIUM-ZINC) TABS Take 1-2 tablets by mouth daily.   . nitroGLYCERIN (NITROSTAT) 0.4 MG SL tablet Place 1 tablet (0.4 mg total) under the tongue every 5 (five) minutes as needed for chest pain.  . pantoprazole (PROTONIX) 40 MG tablet Take 1 tablet (40 mg total) by mouth daily.  . psyllium (REGULOID) 0.52 g capsule Take 1.5 g by mouth daily.  . ticagrelor (BRILINTA) 90 MG TABS tablet Take 1 tablet (90 mg total) by mouth 2 (two) times daily.     Allergies:   Soy allergy and Sulfa antibiotics   Social History   Socioeconomic History  . Marital status: Divorced    Spouse name: Not on file  . Number of children: 2  . Years of education: 25  . Highest education level: Not on file  Occupational History  . Occupation: Retired  Tobacco Use  . Smoking status: Never Smoker  . Smokeless tobacco: Never Used  Vaping Use  . Vaping Use: Never used  Substance and Sexual Activity  . Alcohol use: Yes    Alcohol/week: 1.0 standard drink    Types: 1 Glasses of wine per week  . Drug use: No  . Sexual activity: Not Currently  Other Topics Concern  . Not on file  Social History Narrative  . Not on file   Social Determinants of Health   Financial Resource Strain:   . Difficulty of Paying Living Expenses: Not on file  Food Insecurity:   . Worried About Charity fundraiser in the Last Year: Not on file  . Ran Out of Food in the Last Year: Not on file  Transportation Needs:   . Lack of Transportation (Medical): Not on file  . Lack of Transportation (Non-Medical): Not on file  Physical Activity:   . Days of Exercise per Week: Not on file  . Minutes of Exercise per Session: Not on file  Stress:   . Feeling of Stress : Not on file  Social Connections:   .  Frequency of Communication with Friends and Family: Not on file  . Frequency of Social Gatherings with Friends and Family: Not on file  . Attends Religious Services: Not on file  . Active Member of Clubs or Organizations: Not on file  . Attends Archivist Meetings: Not on file  . Marital Status: Not on file     Family History:  The patient's family history includes Aneurysm in her mother; GER disease in her father; Stomach cancer in her maternal grandfather.  ROS:   Please see the history of present illness.    ROS All other systems reviewed and are negative.   PHYSICAL EXAM:   VS:  BP 118/72   Pulse 72   Ht 5\' 2"  (1.575 m)   Wt 212 lb 3.2 oz (96.3 kg)   SpO2 96%   BMI 38.81 kg/m   Physical Exam  GEN: Obese, in no acute distress  Neck: no JVD, carotid bruits, or masses Cardiac:RRR; no murmurs, rubs, or gallops  Respiratory:  clear to auscultation bilaterally, normal work of breathing GI: soft, nontender, nondistended, + BS Ext: without cyanosis, clubbing, or edema, Good distal pulses bilaterally Neuro:  Alert and Oriented x 3 Psych: euthymic mood, full affect  Wt Readings from Last 3 Encounters:  12/28/19 212 lb 3.2 oz (96.3 kg)  12/07/19 215 lb (97.5 kg)  11/22/19 215 lb 2.7 oz (97.6 kg)      Studies/Labs Reviewed:   EKG:  EKG is not ordered today.   Recent Labs: 10/13/2019: ALT 29 10/14/2019: BUN 8; Creatinine, Ser 0.79; Hemoglobin 15.7; Platelets 332; Potassium 3.6; Sodium 136   Lipid Panel    Component Value Date/Time   CHOL 176 10/13/2019 1502   TRIG 161 (H) 10/13/2019 1502   HDL 48 10/13/2019 1502   CHOLHDL 3.7 10/13/2019 1502   VLDL 32 10/13/2019 1502   LDLCALC 96 10/13/2019 1502    Additional studies/ records that were reviewed today include:  ECHOCARDIOGRAM COMPLETE  10/13/2019 IMPRESSIONS   1. Left ventricular ejection fraction, by estimation, is 40 to 45%. The left ventricle has mildly decreased function. The left ventricle demonstrates  regional wall motion abnormalities (see scoring diagram/findings for description). Mid to apical anterior/anteroseptal akinesis. Left ventricular diastolic parameters are consistent with Grade II diastolic dysfunction (pseudonormalization). Elevated left atrial pressure.   2. Right ventricular systolic function is normal. The right ventricular size is normal. There is normal pulmonary artery systolic pressure. The estimated right ventricular systolic pressure is 17.0 mmHg.   3. The mitral valve is normal in structure. Trivial mitral valve regurgitation.   4. The aortic valve is tricuspid. Aortic valve regurgitation is not visualized. No aortic stenosis is present.   5. The inferior vena cava is normal in size with greater than 50% respiratory variability, suggesting right atrial pressure of 3 mmHg.    CARDIAC CATHETERIZATION 10/13/19 LM absent LAD ost 30, prox 99 thrombotic; D1 90 thrombotic LCx normal RCA prox 15, mid 15 EF ~35 PCI: STENT RESOLUTE ONYX DES 2.75X22 to pLAD PCI: POBA to D1  Conclusions: 1. Severe single-vessel CAD with 99% stenosis of mid LAD involving D1 with TIMI-1 flow. 2. Moderately to severely reduced left ventricular systolic function with mid and apical anterior hypokinesis/akinesis.  LVEF ~35%. 3. Mildly elevated left ventricular filling pressure (LVEDP 20-25 mmHg). 4. Successful PCI to mid LAD/D1 using Resolute Onyx 2.75 x 22 mm drug-eluting stent and kissing balloon inflation in LAD/D1 with 0% residual stenosis and TIMI-3 flow.  Carotid Dopplers 7/2021Summary:  Right Carotid: Velocities in the right ICA are consistent with a 1-39%  stenosis.   Left Carotid: Velocities in the left ICA are consistent with a 1-39%  stenosis.   Vertebrals: Bilateral vertebral arteries demonstrate antegrade flow.  Subclavians: Normal flow hemodynamics were seen in bilateral subclavian               arteries.   *See table(s) above for measurements and observations.       Electronically signed by Kathlyn Sacramento MD on 10/31/2019  at 8:53:20 AM.       ASSESSMENT:    1. Coronary artery disease involving native coronary artery of native heart without angina pectoris   2. Ischemic cardiomyopathy   3. Chronic combined systolic and diastolic CHF (congestive heart failure) ([place])   4. Hyperlipidemia, unspecified hyperlipidemia type   5. Class 2 severe obesity due to excess calories with serious comorbidity and body mass index (BMI) of 39.0 to 39.9 in adult ([place])   6. OSA (obstructive sleep apnea)   7. Gastroesophageal reflux disease, unspecified whether esophagitis present      PLAN:  In order of problems listed above:   CAD status post NSTEMI treated with DES to the mid LAD and POBA to the D1 on Brilinta, ASA, and lipitor.   Ischemic cardiomyopathy ejection fraction 35% at cath 40 to 45% on echo with moderate diastolic dysfunction on beta-blocker, angiotensin receptor blocker   follow-up echo in October scheduled   Chronic systolic and diastolic CHF-no CHF on exam, weighing herself daily.  2 g sodium diet   Hyperlipidemia started on atorvastatin 80 mg once daily.  Will need repeat lipids and LFTs in Oct   Obesity exercise and weight loss essential to overall health.   doing cardiac rehab. Will refer to weight loss management   Severe sleep apnea-just diagnosed  GERD-still having a lot of indigestion. Told her to take Brilinta and ASA after meals. Refer back to Dr. Fuller Plan.   Medication Adjustments/Labs and Tests Ordered: Current medicines are reviewed at length with the patient today.  Concerns regarding medicines are outlined above.  Medication changes, Labs and Tests ordered today are listed in the Patient Instructions below. Patient Instructions  Medication Instructions:  Your physician recommends that you continue on your current medications as directed. Please refer to the Current Medication list given to you today.  *If you need a refill on  your cardiac medications before your next appointment, please call your pharmacy*   Lab Work: Your physician recommends that you return for a FASTING lipid profile: You are scheduled to have labs on the same day as your Echocardiogram 01/25/2020  If you have labs (blood work) drawn today and your tests are completely normal, you will receive your results only by: Marland Kitchen MyChart Message (if you have MyChart) OR . A paper copy in the mail If you have any lab test that is abnormal or we need to change your treatment, we will call you to review the results.   Testing/Procedures: None   Follow-Up: At [facility], you and your health needs are our priority.  As part of our continuing mission to provide you with exceptional heart care, we have created designated Provider Care Teams.  These Care Teams include your primary Cardiologist (physician) and Advanced Practice Providers (APPs -  Physician Assistants and Nurse Practitioners) who all work together to provide you with the care you need, when you need it.  We recommend signing up for the patient portal called "MyChart".  Sign up information is provided on this After Visit Summary.  MyChart is used to connect with patients for Virtual Visits (Telemedicine).  Patients are able to view lab/test results, encounter notes, upcoming appointments, etc.  Non-urgent messages can be sent to your provider as well.   To learn more about what you can do with MyChart, go to [email or web address].    Your next appointment:   02/13/2020  The format for your next appointment:   In Person  Provider:   Lauree Chandler, MD  Other Instructions None     Signed, Ermalinda Barrios, PA-C  12/28/2019 2:03 PM    [facility] [address], [place], [place]  [address] Phone: [phone]; Fax: [phone]

## 2019-12-27 NOTE — Progress Notes (Signed)
Cardiac Individual Treatment Plan  Patient Details  Name: Valerie Fields MRN: [id number] Date of Birth: 18-Mar-1951 Referring Provider:     [place] from 11/22/2019 in [place]  Referring Provider Lauree Chandler MD      Initial Encounter Date:    CARDIAC REHAB PHASE II ORIENTATION from 11/22/2019 in [place]  Date 11/22/19      Visit Diagnosis: NSTEMI (non-ST elevated myocardial infarction) ([place]) 10/13/19  S/P DES mLAD 10/13/19  Chronic combined systolic and diastolic CHF (congestive heart failure) ([place])  Patient's Home Medications on Admission:  Current Outpatient Medications:  .  acetaminophen (TYLENOL) 325 MG tablet, Take 2 tablets (650 mg total) by mouth every 4 (four) hours as needed for mild pain, moderate pain or headache., Disp: , Rfl:  .  aspirin 81 MG chewable tablet, Chew 1 tablet (81 mg total) by mouth daily., Disp: , Rfl:  .  atorvastatin (LIPITOR) 80 MG tablet, Take 1 tablet (80 mg total) by mouth daily., Disp: 30 tablet, Rfl: 11 .  azelastine (ASTELIN) 0.1 % nasal spray, Place 1 spray into both nostrils daily. Use in each nostril as directed , Disp: , Rfl:  .  cetirizine (ZYRTEC) 10 MG tablet, Take 10 mg by mouth daily., Disp: , Rfl:  .  cholecalciferol (VITAMIN D) 1000 units tablet, Take 2,000 Units by mouth daily. , Disp: , Rfl:  .  Coenzyme Q10 (CO Q 10 PO), Take 1 capsule by mouth daily. , Disp: , Rfl:  .  dicyclomine (BENTYL) 10 MG capsule, Take 1 tablet by mouth every 6 hours as needed. (Patient taking differently: Take 10 mg by mouth every 6 (six) hours as needed for spasms. ), Disp: 15 capsule, Rfl: 2 .  diphenhydrAMINE (SLEEP AID, DIPHENHYDRAMINE,) 25 MG tablet, Take 25 mg by mouth at bedtime as needed for sleep., Disp: , Rfl:  .  docusate sodium (STOOL SOFTENER) 100 MG capsule, Take 100 mg by mouth daily. , Disp: , Rfl:  .  losartan (COZAAR) 25 MG tablet,  Take 1 tablet (25 mg total) by mouth daily., Disp: 30 tablet, Rfl: 11 .  melatonin 3 MG TABS tablet, Take 6 mg by mouth at bedtime., Disp: , Rfl:  .  metoprolol succinate (TOPROL-XL) 25 MG 24 hr tablet, Take 1 tablet (25 mg total) by mouth daily., Disp: 30 tablet, Rfl: 11 .  montelukast (SINGULAIR) 10 MG tablet, Take 10 mg by mouth at bedtime. , Disp: , Rfl: 3 .  Multiple Minerals (CALCIUM-MAGNESIUM-ZINC) TABS, Take 1-2 tablets by mouth daily. , Disp: , Rfl:  .  nitroGLYCERIN (NITROSTAT) 0.4 MG SL tablet, Place 1 tablet (0.4 mg total) under the tongue every 5 (five) minutes as needed for chest pain., Disp: 25 tablet, Rfl: 12 .  pantoprazole (PROTONIX) 40 MG tablet, Take 1 tablet (40 mg total) by mouth daily., Disp: 30 tablet, Rfl: 11 .  psyllium (REGULOID) 0.52 g capsule, Take 1.5 g by mouth daily., Disp: , Rfl:  .  ticagrelor (BRILINTA) 90 MG TABS tablet, Take 1 tablet (90 mg total) by mouth 2 (two) times daily., Disp: 180 tablet, Rfl: 3  Past Medical History: Past Medical History:  Diagnosis Date  . Allergy   . Asthma   . Chronic combined systolic and diastolic CHF 23/76/2831   Ischemic CM // Echocardiogram 7/21: EF 40-45, Gr 2 DD, mid to apical ant/ant-sept AK, RVSP 23.4, trivial MR  . Coronary artery disease  NSTEMI 7/21: oLAD 30, pLAD 99 >> PCI: DES; D1 90 >> PCI: POBA, prox and mid RCA 15, EF 35  . Diverticulitis   . GERD (gastroesophageal reflux disease)   . Hyperlipidemia 10/15/2019   Zetia DC'd during admit for NSTEMI 7/21 >> ? to Atorvastatin  . Ischemic cardiomyopathy 10/15/2019  . Non-ST elevation (NSTEMI) MI 10/13/2019   PCI:  DES to LAD and POBA to D1    Tobacco Use: Social History   Tobacco Use  Smoking Status Never Smoker  Smokeless Tobacco Never Used    Labs: Recent Review Scientist, physiological    Labs for ITP Cardiac and Pulmonary Rehab Latest Ref Rng & Units 10/13/2019   Cholestrol 0 - 200 mg/dL 176   LDLCALC 0 - 99 mg/dL 96   HDL >40 mg/dL 48   Trlycerides <150  mg/dL 161(H)   Hemoglobin A1c 4.8 - 5.6 % 5.6      Capillary Blood Glucose: No results found for: GLUCAP   Exercise Target Goals: Exercise Program Goal: Individual exercise prescription set using results from initial 6 min walk test and THRR while considering  patient's activity barriers and safety.   Exercise Prescription Goal: Initial exercise prescription builds to 30-45 minutes a day of aerobic activity, 2-3 days per week.  Home exercise guidelines will be given to patient during program as part of exercise prescription that the participant will acknowledge.  Activity Barriers & Risk Stratification:  Activity Barriers & Cardiac Risk Stratification - 11/22/19 1545      Activity Barriers & Cardiac Risk Stratification   Activity Barriers Joint Problems;Other (comment)    Comments R knee osteoarthritis, Left hastring knee injury    Cardiac Risk Stratification High           6 Minute Walk:  6 Minute Walk    Row Name 11/22/19 1514         6 Minute Walk   Phase Initial     Distance 1428 feet     Walk Time 6 minutes     # of Rest Breaks 0     MPH 2.7     METS 2.52     RPE 13     Perceived Dyspnea  1     VO2 Peak 8.82     Symptoms Yes (comment)     Comments SOB due to mask, RPD =1.  Pt c/o indigestion in left upper back # 2 on 0-10 scale. EKG ordered.     Resting HR 79 bpm     Resting BP 104/72     Resting Oxygen Saturation  98 %     Exercise Oxygen Saturation  during 6 min walk 97 %     Max Ex. HR 91 bpm     Max Ex. BP 132/68     2 Minute Post BP 116/66            Oxygen Initial Assessment:   Oxygen Re-Evaluation:   Oxygen Discharge (Final Oxygen Re-Evaluation):   Initial Exercise Prescription:  Initial Exercise Prescription - 11/22/19 1500      Date of Initial Exercise RX and Referring Provider   Date 11/22/19    Referring Provider Lauree Chandler MD    Expected Discharge Date 01/20/20      NuStep   Level 1    SPM 75    Minutes 25     METs 1.5      Prescription Details   Frequency (times per week) 3    Duration Progress  to 30 minutes of continuous aerobic without signs/symptoms of physical distress      Intensity   THRR 40-80% of Max Heartrate 61-122    Ratings of Perceived Exertion 11-13    Perceived Dyspnea 0-4      Progression   Progression Continue progressive overload as per policy without signs/symptoms or physical distress.      Resistance Training   Training Prescription Yes    Weight 2 lbs    Reps 10-15           Perform Capillary Blood Glucose checks as needed.  Exercise Prescription Changes:   Exercise Prescription Changes    Row Name 11/28/19 1440 12/14/19 1440 12/16/19 1435 12/26/19 1450       Response to Exercise   Blood Pressure (Admit) 110/72 122/76 116/64 104/60    Blood Pressure (Exercise) 114/82 110/60 138/74 108/72    Blood Pressure (Exit) 110/60 110/60 112/60 108/56    Heart Rate (Admit) 88 bpm 77 bpm 84 bpm 89 bpm    Heart Rate (Exercise) 90 bpm 108 bpm 107 bpm 109 bpm    Heart Rate (Exit) 68 bpm 73 bpm 84 bpm 71 bpm    Rating of Perceived Exertion (Exercise) 11 13 12 12     Symptoms None None None None    Comments Pt's first day of exercise in the CRP2 program. Reviewed METS Reviwed Home Exercise Rx Reviewed METs     Duration Progress to 30 minutes of  aerobic without signs/symptoms of physical distress Progress to 30 minutes of  aerobic without signs/symptoms of physical distress Progress to 30 minutes of  aerobic without signs/symptoms of physical distress Progress to 30 minutes of  aerobic without signs/symptoms of physical distress    Intensity THRR unchanged THRR unchanged THRR unchanged THRR unchanged      Progression   Progression Continue to progress workloads to maintain intensity without signs/symptoms of physical distress. Continue to progress workloads to maintain intensity without signs/symptoms of physical distress. Continue to progress workloads to maintain  intensity without signs/symptoms of physical distress. Continue to progress workloads to maintain intensity without signs/symptoms of physical distress.    Average METs 1.6 1.92 2.32 2.3      Resistance Training   Training Prescription Yes No Yes Yes    Weight 2 lbs --  Pt does not use weights on Wednesdays. Uses 2 lbs M, F 2 lbs 2 lbs    Reps 10-15 -- 10-15 10-15    Time 10 Minutes -- 10 Minutes 10 Minutes      Interval Training   Interval Training No No No No      NuStep   Level 1 2 3 3     SPM 75 75 85 85    Minutes 25 15 15 15     METs 1.6 1.9 1.9 1.9      Track   Laps -- 8 15 12     Minutes -- 15 15 15     METs -- 1.93 2.74 2.39      Home Exercise Plan   Plans to continue exercise at -- -- Home (comment) Home (comment)    Frequency -- -- Add 2 additional days to program exercise sessions. Add 2 additional days to program exercise sessions.    Initial Home Exercises Provided -- -- 12/16/19 12/16/19           Exercise Comments:   Exercise Comments    Row Name 11/28/19 1448 12/14/19 1440 12/16/19 1446 12/26/19 1431  Exercise Comments Pt's first day of exercise in the CRP2 program. Pt tolerated exercise well with no complaints. Reviewed METs. Patient is making good progress with no complaints. Reviewed home exercise Rx with patient. Pt wil walk 30 minutes 2x/wk in addition to her chair yoga. Reviewed METs. Pt is making good progress in the CRP2 progam and walking at home. Will continue to progress as tolerated.           Exercise Goals and Review:   Exercise Goals    Row Name 11/22/19 1440             Exercise Goals   Increase Physical Activity Yes       Intervention Provide advice, education, support and counseling about physical activity/exercise needs.;Develop an individualized exercise prescription for aerobic and resistive training based on initial evaluation findings, risk stratification, comorbidities and participant's personal goals.       Expected  Outcomes Short Term: Attend rehab on a regular basis to increase amount of physical activity.;Long Term: Add in home exercise to make exercise part of routine and to increase amount of physical activity.;Long Term: Exercising regularly at least 3-5 days a week.       Increase Strength and Stamina Yes       Intervention Provide advice, education, support and counseling about physical activity/exercise needs.;Develop an individualized exercise prescription for aerobic and resistive training based on initial evaluation findings, risk stratification, comorbidities and participant's personal goals.       Expected Outcomes Short Term: Increase workloads from initial exercise prescription for resistance, speed, and METs.;Short Term: Perform resistance training exercises routinely during rehab and add in resistance training at home;Long Term: Improve cardiorespiratory fitness, muscular endurance and strength as measured by increased METs and functional capacity (6MWT)       Able to understand and use rate of perceived exertion (RPE) scale Yes       Intervention Provide education and explanation on how to use RPE scale       Expected Outcomes Short Term: Able to use RPE daily in rehab to express subjective intensity level;Long Term:  Able to use RPE to guide intensity level when exercising independently       Knowledge and understanding of Target Heart Rate Range (THRR) Yes       Intervention Provide education and explanation of THRR including how the numbers were predicted and where they are located for reference       Expected Outcomes Short Term: Able to state/look up THRR;Short Term: Able to use daily as guideline for intensity in rehab;Long Term: Able to use THRR to govern intensity when exercising independently       Able to check pulse independently Yes       Intervention Provide education and demonstration on how to check pulse in carotid and radial arteries.;Review the importance of being able to check  your own pulse for safety during independent exercise       Expected Outcomes Short Term: Able to explain why pulse checking is important during independent exercise;Long Term: Able to check pulse independently and accurately       Understanding of Exercise Prescription Yes       Intervention Provide education, explanation, and written materials on patient's individual exercise prescription       Expected Outcomes Short Term: Able to explain program exercise prescription;Long Term: Able to explain home exercise prescription to exercise independently              Exercise Goals Re-Evaluation :  Exercise Goals Re-Evaluation    [place] Name 11/28/19 1446 12/16/19 1445           Exercise Goal Re-Evaluation   Exercise Goals Review Increase Physical Activity;Increase Strength and Stamina;Able to understand and use rate of perceived exertion (RPE) scale;Knowledge and understanding of Target Heart Rate Range (THRR);Able to check pulse independently;Understanding of Exercise Prescription Increase Physical Activity;Increase Strength and Stamina;Able to understand and use rate of perceived exertion (RPE) scale;Knowledge and understanding of Target Heart Rate Range (THRR);Able to check pulse independently;Understanding of Exercise Prescription      Comments Pt's first day of exercise in the CRP2 program. Pt tolerated exercise well with no complaints and verbalizes understnading of Exercise Rx, RPE scale, and THR. Reviewed home exercise Rx today with patient. Pt does chair yoga 2x/week, and she will add walking 2x/week for 30 minutes.      Expected Outcomes Will continue to monitor patient and progress workloads as tolerated. Pt will walk 2x/week at home for 30 minutes.             Discharge Exercise Prescription (Final Exercise Prescription Changes):  Exercise Prescription Changes - 12/26/19 1450      Response to Exercise   Blood Pressure (Admit) 104/60    Blood Pressure (Exercise) 108/72    Blood  Pressure (Exit) 108/56    Heart Rate (Admit) 89 bpm    Heart Rate (Exercise) 109 bpm    Heart Rate (Exit) 71 bpm    Rating of Perceived Exertion (Exercise) 12    Symptoms None    Comments Reviewed METs     Duration Progress to 30 minutes of  aerobic without signs/symptoms of physical distress    Intensity THRR unchanged      Progression   Progression Continue to progress workloads to maintain intensity without signs/symptoms of physical distress.    Average METs 2.3      Resistance Training   Training Prescription Yes    Weight 2 lbs    Reps 10-15    Time 10 Minutes      Interval Training   Interval Training No      NuStep   Level 3    SPM 85    Minutes 15    METs 1.9      Track   Laps 12    Minutes 15    METs 2.39      Home Exercise Plan   Plans to continue exercise at Home (comment)    Frequency Add 2 additional days to program exercise sessions.    Initial Home Exercises Provided 12/16/19           Nutrition:  Target Goals: Understanding of nutrition guidelines, daily intake of sodium 1500mg , cholesterol 200mg , calories 30% from fat and 7% or less from saturated fats, daily to have 5 or more servings of fruits and vegetables.  Biometrics:  Pre Biometrics - 11/22/19 1330      Pre Biometrics   Waist Circumference 44 inches    Hip Circumference 53.25 inches    Waist to Hip Ratio 0.83 %    Triceps Skinfold 32 mm    % Body Fat 49.6 %    Grip Strength 32 kg    Flexibility 12 in    Single Leg Stand 29.85 seconds            Nutrition Therapy Plan and Nutrition Goals:  Nutrition Therapy & Goals - 12/07/19 1453      Nutrition Therapy   Diet Heart Healthy  Drug/Food Interactions Statins/Certain Fruits      Personal Nutrition Goals   Nutrition Goal Pt to build a healthy plate including vegetables, fruits, whole grains, and low-fat dairy products in a heart healthy meal plan.    Personal Goal #2 Pt to identify food quantities necessary to achieve  weight loss of 6-24 lb at graduation from cardiac rehab.      Intervention Plan   Intervention Prescribe, educate and counsel regarding individualized specific dietary modifications aiming towards targeted core components such as weight, hypertension, lipid management, diabetes, heart failure and other comorbidities.    Expected Outcomes Short Term Goal: Understand basic principles of dietary content, such as calories, fat, sodium, cholesterol and nutrients.           Nutrition Assessments:   Nutrition Goals Re-Evaluation:  Nutrition Goals Re-Evaluation    [place] Name 12/07/19 1454             Goals   Current Weight 215 lb (97.5 kg)       Nutrition Goal Pt to build a healthy plate including vegetables, fruits, whole grains, and low-fat dairy products in a heart healthy meal plan.         Personal Goal #2 Re-Evaluation   Personal Goal #2 Pt to identify food quantities necessary to achieve weight loss of 6-24 lb at graduation from cardiac rehab.              Nutrition Goals Re-Evaluation:  Nutrition Goals Re-Evaluation    [place] Name 12/07/19 1454             Goals   Current Weight 215 lb (97.5 kg)       Nutrition Goal Pt to build a healthy plate including vegetables, fruits, whole grains, and low-fat dairy products in a heart healthy meal plan.         Personal Goal #2 Re-Evaluation   Personal Goal #2 Pt to identify food quantities necessary to achieve weight loss of 6-24 lb at graduation from cardiac rehab.              Nutrition Goals Discharge (Final Nutrition Goals Re-Evaluation):  Nutrition Goals Re-Evaluation - 12/07/19 1454      Goals   Current Weight 215 lb (97.5 kg)    Nutrition Goal Pt to build a healthy plate including vegetables, fruits, whole grains, and low-fat dairy products in a heart healthy meal plan.      Personal Goal #2 Re-Evaluation   Personal Goal #2 Pt to identify food quantities necessary to achieve weight loss of 6-24 lb at graduation from  cardiac rehab.           Psychosocial: Target Goals: Acknowledge presence or absence of significant depression and/or stress, maximize coping skills, provide positive support system. Participant is able to verbalize types and ability to use techniques and skills needed for reducing stress and depression.  Initial Review & Psychosocial Screening:  Initial Psych Review & Screening - 11/22/19 1346      Initial Review   Current issues with Current Anxiety/Panic   Bette says she has had some anxiety since her hospitalization     Family Dynamics   Good Support System? Yes   Bette lives alone but has a good support network of friends and neighbors who live nearby. Bette's children live out of the area     Barriers   Psychosocial barriers to participate in program The patient should benefit from training in stress management and relaxation.  Screening Interventions   Interventions Encouraged to exercise;To provide support and resources with identified psychosocial needs    Expected Outcomes Long Term Goal: Stressors or current issues are controlled or eliminated.;Short Term goal: Identification and review with participant of any Quality of Life or Depression concerns found by scoring the questionnaire.           Quality of Life Scores:  Quality of Life - 11/22/19 1519      Quality of Life   Select Quality of Life      Quality of Life Scores   Health/Function Pre 27 %    Socioeconomic Pre 30 %    Psych/Spiritual Pre 30 %    Family Pre 30 %    GLOBAL Pre 28.69 %          Scores of 19 and below usually indicate a poorer quality of life in these areas.  A difference of  2-3 points is a clinically meaningful difference.  A difference of 2-3 points in the total score of the Quality of Life Index has been associated with significant improvement in overall quality of life, self-image, physical symptoms, and general health in studies assessing change in quality of  life.  PHQ-9: Recent Review Flowsheet Data    Depression screen [facility] 2/9 11/22/2019   Decreased Interest 0   Down, Depressed, Hopeless 0   PHQ - 2 Score 0     Interpretation of Total Score  Total Score Depression Severity:  1-4 = Minimal depression, 5-9 = Mild depression, 10-14 = Moderate depression, 15-19 = Moderately severe depression, 20-27 = Severe depression   Psychosocial Evaluation and Intervention:  Psychosocial Evaluation - 11/29/19 1132      Psychosocial Evaluation & Interventions   Interventions Encouraged to exercise with the program and follow exercise prescription    Comments Ms. Citron admits to current anxiety over her health. She has a strong support system and a positive attitude and outlook. Her hobbies include reading, crafts, geneology, and sewing. These hobbies are utilized for stress and anxiety reduction.    Expected Outcomes Ms. Polyakov will continue to have a positive attitue and outlook. She will utilize her hobbies and support system for anxiety and stress reduction.    Continue Psychosocial Services  Follow up required by staff           Psychosocial Re-Evaluation:  Psychosocial Re-Evaluation    [place] Name 12/06/19 1401 12/27/19 1127           Psychosocial Re-Evaluation   Current issues with Current Anxiety/Panic None Identified      Comments Ms. Lesko admits to current anxiety over her health. She has a strong support system and a positive attitude and outlook. Her hobbies include reading, crafts, geneology, and sewing. These hobbies are utilized for stress and anxiety reduction. Ms. Rudge states her anxiety over her ability to manager her health is decreasing. She continues to have a positive attitude and outlook and acknolodges a strong support system. She continues to enjoy her hobbies and feels she has a new appreciation of life since her MI. She is grateful for the opportunity to participate in CR. No interventions needed at this time       Expected Outcomes Ms. Ignasiak will continue to have a positive attitue and outlook. She will utilize her hobbies and support system for anxiety and stress reduction. Ms. Viglione will continue to have a positive attitue and outlook. She will utilize her hobbies and support system for anxiety and stress reduction.  Interventions Encouraged to attend Cardiac Rehabilitation for the exercise Encouraged to attend Cardiac Rehabilitation for the exercise      Continue Psychosocial Services  Follow up required by staff No Follow up required             Psychosocial Discharge (Final Psychosocial Re-Evaluation):  Psychosocial Re-Evaluation - 12/27/19 1127      Psychosocial Re-Evaluation   Current issues with None Identified    Comments Ms. Gitto states her anxiety over her ability to manager her health is decreasing. She continues to have a positive attitude and outlook and acknolodges a strong support system. She continues to enjoy her hobbies and feels she has a new appreciation of life since her MI. She is grateful for the opportunity to participate in CR. No interventions needed at this time    Expected Outcomes Ms. Umbaugh will continue to have a positive attitue and outlook. She will utilize her hobbies and support system for anxiety and stress reduction.    Interventions Encouraged to attend Cardiac Rehabilitation for the exercise    Continue Psychosocial Services  No Follow up required           Vocational Rehabilitation: Provide vocational rehab assistance to qualifying candidates.   Vocational Rehab Evaluation & Intervention:  Vocational Rehab - 11/22/19 1550      Initial Vocational Rehab Evaluation & Intervention   Assessment shows need for Vocational Rehabilitation No   Otilio Saber is retired and does not need vocational rehab at this time.          Education: Education Goals: Education classes will be provided on a weekly basis, covering required topics. Participant will state  understanding/return demonstration of topics presented.  Learning Barriers/Preferences:  Learning Barriers/Preferences - 11/22/19 1520      Learning Barriers/Preferences   Learning Barriers None    Learning Preferences Written Material;Audio;Verbal Instruction;Computer/Internet           Education Topics: Count Your Pulse:  -Group instruction provided by verbal instruction, demonstration, patient participation and written materials to support subject.  Instructors address importance of being able to find your pulse and how to count your pulse when at home without a heart monitor.  Patients get hands on experience counting their pulse with staff help and individually.   Heart Attack, Angina, and Risk Factor Modification:  -Group instruction provided by verbal instruction, video, and written materials to support subject.  Instructors address signs and symptoms of angina and heart attacks.    Also discuss risk factors for heart disease and how to make changes to improve heart health risk factors.   Functional Fitness:  -Group instruction provided by verbal instruction, demonstration, patient participation, and written materials to support subject.  Instructors address safety measures for doing things around the house.  Discuss how to get up and down off the floor, how to pick things up properly, how to safely get out of a chair without assistance, and balance training.   Meditation and Mindfulness:  -Group instruction provided by verbal instruction, patient participation, and written materials to support subject.  Instructor addresses importance of mindfulness and meditation practice to help reduce stress and improve awareness.  Instructor also leads participants through a meditation exercise.    Stretching for Flexibility and Mobility:  -Group instruction provided by verbal instruction, patient participation, and written materials to support subject.  Instructors lead participants  through series of stretches that are designed to increase flexibility thus improving mobility.  These stretches are additional exercise for major muscle groups  that are typically performed during regular warm up and cool down.   Hands Only CPR:  -Group verbal, video, and participation provides a basic overview of AHA guidelines for community CPR. Role-play of emergencies allow participants the opportunity to practice calling for help and chest compression technique with discussion of AED use.   Hypertension: -Group verbal and written instruction that provides a basic overview of hypertension including the most recent diagnostic guidelines, risk factor reduction with self-care instructions and medication management.    Nutrition I class: Heart Healthy Eating:  -Group instruction provided by PowerPoint slides, verbal discussion, and written materials to support subject matter. The instructor gives an explanation and review of the Therapeutic Lifestyle Changes diet recommendations, which includes a discussion on lipid goals, dietary fat, sodium, fiber, plant stanol/sterol esters, sugar, and the components of a well-balanced, healthy diet.   Nutrition II class: Lifestyle Skills:  -Group instruction provided by PowerPoint slides, verbal discussion, and written materials to support subject matter. The instructor gives an explanation and review of label reading, grocery shopping for heart health, heart healthy recipe modifications, and ways to make healthier choices when eating out.   Diabetes Question & Answer:  -Group instruction provided by PowerPoint slides, verbal discussion, and written materials to support subject matter. The instructor gives an explanation and review of diabetes co-morbidities, pre- and post-prandial blood glucose goals, pre-exercise blood glucose goals, signs, symptoms, and treatment of hypoglycemia and hyperglycemia, and foot care basics.   Diabetes Blitz:  -Group  instruction provided by PowerPoint slides, verbal discussion, and written materials to support subject matter. The instructor gives an explanation and review of the physiology behind type 1 and type 2 diabetes, diabetes medications and rational behind using different medications, pre- and post-prandial blood glucose recommendations and Hemoglobin A1c goals, diabetes diet, and exercise including blood glucose guidelines for exercising safely.    Portion Distortion:  -Group instruction provided by PowerPoint slides, verbal discussion, written materials, and food models to support subject matter. The instructor gives an explanation of serving size versus portion size, changes in portions sizes over the last 20 years, and what consists of a serving from each food group.   Stress Management:  -Group instruction provided by verbal instruction, video, and written materials to support subject matter.  Instructors review role of stress in heart disease and how to cope with stress positively.     Exercising on Your Own:  -Group instruction provided by verbal instruction, power point, and written materials to support subject.  Instructors discuss benefits of exercise, components of exercise, frequency and intensity of exercise, and end points for exercise.  Also discuss use of nitroglycerin and activating EMS.  Review options of places to exercise outside of rehab.  Review guidelines for sex with heart disease.   Cardiac Drugs I:  -Group instruction provided by verbal instruction and written materials to support subject.  Instructor reviews cardiac drug classes: antiplatelets, anticoagulants, beta blockers, and statins.  Instructor discusses reasons, side effects, and lifestyle considerations for each drug class.   Cardiac Drugs II:  -Group instruction provided by verbal instruction and written materials to support subject.  Instructor reviews cardiac drug classes: angiotensin converting enzyme inhibitors  (ACE-I), angiotensin II receptor blockers (ARBs), nitrates, and calcium channel blockers.  Instructor discusses reasons, side effects, and lifestyle considerations for each drug class.   Anatomy and Physiology of the Circulatory System:  Group verbal and written instruction and models provide basic cardiac anatomy and physiology, with the coronary electrical and arterial systems.  Review of: AMI, Angina, Valve disease, Heart Failure, Peripheral Artery Disease, Cardiac Arrhythmia, Pacemakers, and the ICD.   Other Education:  -Group or individual verbal, written, or video instructions that support the educational goals of the cardiac rehab program.   Holiday Eating Survival Tips:  -Group instruction provided by PowerPoint slides, verbal discussion, and written materials to support subject matter. The instructor gives patients tips, tricks, and techniques to help them not only survive but enjoy the holidays despite the onslaught of food that accompanies the holidays.   Knowledge Questionnaire Score:  Knowledge Questionnaire Score - 11/22/19 1522      Knowledge Questionnaire Score   Pre Score 19/24           Core Components/Risk Factors/Patient Goals at Admission:  Personal Goals and Risk Factors at Admission - 11/22/19 1552      Core Components/Risk Factors/Patient Goals on Admission    Weight Management Yes;Obesity;Weight Loss    Admit Weight 215 lb 2.7 oz (97.6 kg)    Expected Outcomes Short Term: Continue to assess and modify interventions until short term weight is achieved;Long Term: Adherence to nutrition and physical activity/exercise program aimed toward attainment of established weight goal;Weight Maintenance: Understanding of the daily nutrition guidelines, which includes 25-35% calories from fat, 7% or less cal from saturated fats, less than 200mg  cholesterol, less than 1.5gm of sodium, & 5 or more servings of fruits and vegetables daily;Weight Loss: Understanding of general  recommendations for a balanced deficit meal plan, which promotes 1-2 lb weight loss per week and includes a negative energy balance of [phone] kcal/d;Understanding recommendations for meals to include 15-35% energy as protein, 25-35% energy from fat, 35-60% energy from carbohydrates, less than 200mg  of dietary cholesterol, 20-35 gm of total fiber daily;Understanding of distribution of calorie intake throughout the day with the consumption of 4-5 meals/snacks    Lipids Yes    Intervention Provide education and support for participant on nutrition & aerobic/resistive exercise along with prescribed medications to achieve LDL 70mg , HDL >40mg .    Expected Outcomes Short Term: Participant states understanding of desired cholesterol values and is compliant with medications prescribed. Participant is following exercise prescription and nutrition guidelines.;Long Term: Cholesterol controlled with medications as prescribed, with individualized exercise RX and with personalized nutrition plan. Value goals: LDL < 70mg , HDL > 40 mg.    Stress Yes    Intervention Offer individual and/or small group education and counseling on adjustment to heart disease, stress management and health-related lifestyle change. Teach and support self-help strategies.;Refer participants experiencing significant psychosocial distress to appropriate mental health specialists for further evaluation and treatment. When possible, include family members and significant others in education/counseling sessions.    Expected Outcomes Short Term: Participant demonstrates changes in health-related behavior, relaxation and other stress management skills, ability to obtain effective social support, and compliance with psychotropic medications if prescribed.;Long Term: Emotional wellbeing is indicated by absence of clinically significant psychosocial distress or social isolation.           Core Components/Risk Factors/Patient Goals Review:   Goals  and Risk Factor Review    Row Name 11/29/19 1136 12/06/19 1402 12/27/19 1129         Core Components/Risk Factors/Patient Goals Review   Personal Goals Review Weight Management/Obesity;Lipids;Stress Weight Management/Obesity;Lipids;Stress Weight Management/Obesity;Lipids     Review Ms. Feeback has multiple CAD risk factors. She is eager to participate in CR for risk factor reduction. Her personal goals are to "get back her stamina and strength" and to gain confidence  in her health. Ms. Licklider has multiple CAD risk factors. She is eager to participate in CR for risk factor reduction. Her personal goals are to "get back her stamina and strength" and to gain confidence in her health. Ms. Reveron has multiple CAD risk factors. She remains eager to participate in CR for exercise and risk factor reduction. Her personal goals are to "get back her stamina and strength" and to gain confidence in her health. She is currently on track to meet these goals by the end of her 8 week enrollment.     Expected Outcomes Patient will continue to participate in CR for risk factor reduction. Patient will continue to participate in CR for risk factor reduction. Patient will continue to participate in CR for risk factor reduction.            Core Components/Risk Factors/Patient Goals at Discharge (Final Review):   Goals and Risk Factor Review - 12/27/19 1129      Core Components/Risk Factors/Patient Goals Review   Personal Goals Review Weight Management/Obesity;Lipids    Review Ms. Gorey has multiple CAD risk factors. She remains eager to participate in CR for exercise and risk factor reduction. Her personal goals are to "get back her stamina and strength" and to gain confidence in her health. She is currently on track to meet these goals by the end of her 8 week enrollment.    Expected Outcomes Patient will continue to participate in CR for risk factor reduction.           ITP Comments:  ITP Comments    Row  Name 11/22/19 1340 11/29/19 1129 12/06/19 1358 12/27/19 1123     ITP Comments Dr Fransico Him MD, Medical Director Ms. Mondesir completed her first cardiac rehab exercise session today and tolerated well. VSS. Denied complaints. Worked to an RPE of 11 during 30 min aerobic exerice on the [employer]. Will continue to monitor as patient progresses in her exercise prescription. 30 day ITP review: Ms. Grisanti has completed 3 cardiac rehab exercise sessions since admission. She is tolerating her exercise well. Denies complaints. Works to an RPE of 11-13. She is self motivated to improve her health and requested to change her exercise prescription to including walking the track as walking is difficult for her. Her personal goals are to gain confidence in her health and to "get back" her stamina. Will continue to monitor progression towards goals over the next 30 days. 30 day ITP review: Ms. Kesinger has completed 9 cardiac rehab exercise sessions since admission. She is tolerating her exercise well. Denies complaints. Works to an RPE of 11-13. She is now able to walk the track with greater ease. VSS. Her personal goals are to gain confidence in her health and to "get back" her stamina. States she already sees a difference in her stamina and strength which is also helping to improve her confidence in her health and ability to manage her CAD. Will continue to monitor progression towards goals over the next 30 days.           Comments: see ITP comments

## 2019-12-28 ENCOUNTER — Ambulatory Visit (INDEPENDENT_AMBULATORY_CARE_PROVIDER_SITE_OTHER): Payer: Medicare HMO | Admitting: Physician Assistant

## 2019-12-28 ENCOUNTER — Encounter: Payer: Self-pay | Admitting: Physician Assistant

## 2019-12-28 ENCOUNTER — Other Ambulatory Visit: Payer: Self-pay

## 2019-12-28 ENCOUNTER — Encounter (HOSPITAL_COMMUNITY): Payer: Medicare HMO

## 2019-12-28 VITALS — BP 118/72 | HR 72 | Ht 62.0 in | Wt 212.2 lb

## 2019-12-28 DIAGNOSIS — I5042 Chronic combined systolic (congestive) and diastolic (congestive) heart failure: Secondary | ICD-10-CM

## 2019-12-28 DIAGNOSIS — E785 Hyperlipidemia, unspecified: Secondary | ICD-10-CM | POA: Diagnosis not present

## 2019-12-28 DIAGNOSIS — K219 Gastro-esophageal reflux disease without esophagitis: Secondary | ICD-10-CM

## 2019-12-28 DIAGNOSIS — G4733 Obstructive sleep apnea (adult) (pediatric): Secondary | ICD-10-CM

## 2019-12-28 DIAGNOSIS — I255 Ischemic cardiomyopathy: Secondary | ICD-10-CM | POA: Diagnosis not present

## 2019-12-28 DIAGNOSIS — I2510 Atherosclerotic heart disease of native coronary artery without angina pectoris: Secondary | ICD-10-CM

## 2019-12-28 DIAGNOSIS — Z6839 Body mass index (BMI) 39.0-39.9, adult: Secondary | ICD-10-CM

## 2019-12-28 NOTE — Patient Instructions (Signed)
Medication Instructions:  Your physician recommends that you continue on your current medications as directed. Please refer to the Current Medication list given to you today.  *If you need a refill on your cardiac medications before your next appointment, please call your pharmacy*   Lab Work: Your physician recommends that you return for a FASTING lipid profile: You are scheduled to have labs on the same day as your Echocardiogram 01/25/2020  If you have labs (blood work) drawn today and your tests are completely normal, you will receive your results only by: Marland Kitchen MyChart Message (if you have MyChart) OR . A paper copy in the mail If you have any lab test that is abnormal or we need to change your treatment, we will call you to review the results.   Testing/Procedures: None   Follow-Up: At [facility], you and your health needs are our priority.  As part of our continuing mission to provide you with exceptional heart care, we have created designated Provider Care Teams.  These Care Teams include your primary Cardiologist (physician) and Advanced Practice Providers (APPs -  Physician Assistants and Nurse Practitioners) who all work together to provide you with the care you need, when you need it.  We recommend signing up for the patient portal called "MyChart".  Sign up information is provided on this After Visit Summary.  MyChart is used to connect with patients for Virtual Visits (Telemedicine).  Patients are able to view lab/test results, encounter notes, upcoming appointments, etc.  Non-urgent messages can be sent to your provider as well.   To learn more about what you can do with MyChart, go to [email or web address].    Your next appointment:   02/13/2020  The format for your next appointment:   In Person  Provider:   Lauree Chandler, MD   Other Instructions None

## 2019-12-30 ENCOUNTER — Encounter (HOSPITAL_COMMUNITY)
Admission: RE | Admit: 2019-12-30 | Discharge: 2019-12-30 | Disposition: A | Discharge status: Home / Self Care | Payer: Medicare HMO | Source: Ambulatory Visit | Attending: Cardiology | Admitting: Cardiology

## 2019-12-30 ENCOUNTER — Other Ambulatory Visit: Payer: Self-pay

## 2019-12-30 DIAGNOSIS — I5042 Chronic combined systolic (congestive) and diastolic (congestive) heart failure: Secondary | ICD-10-CM

## 2019-12-30 DIAGNOSIS — I214 Non-ST elevation (NSTEMI) myocardial infarction: Secondary | ICD-10-CM | POA: Insufficient documentation

## 2019-12-30 DIAGNOSIS — Z955 Presence of coronary angioplasty implant and graft: Secondary | ICD-10-CM | POA: Insufficient documentation

## 2020-01-02 ENCOUNTER — Encounter (HOSPITAL_COMMUNITY)
Admission: RE | Admit: 2020-01-02 | Discharge: 2020-01-02 | Disposition: A | Discharge status: Home / Self Care | Payer: Medicare HMO | Source: Ambulatory Visit | Attending: Cardiovascular Disease | Admitting: Cardiovascular Disease

## 2020-01-02 ENCOUNTER — Other Ambulatory Visit: Payer: Self-pay

## 2020-01-02 DIAGNOSIS — I214 Non-ST elevation (NSTEMI) myocardial infarction: Secondary | ICD-10-CM

## 2020-01-02 DIAGNOSIS — Z955 Presence of coronary angioplasty implant and graft: Secondary | ICD-10-CM

## 2020-01-04 ENCOUNTER — Encounter (HOSPITAL_COMMUNITY)
Admission: RE | Admit: 2020-01-04 | Discharge: 2020-01-04 | Disposition: A | Discharge status: Home / Self Care | Payer: Medicare HMO | Source: Ambulatory Visit | Attending: Cardiovascular Disease | Admitting: Cardiovascular Disease

## 2020-01-04 ENCOUNTER — Other Ambulatory Visit: Payer: Self-pay

## 2020-01-04 DIAGNOSIS — I214 Non-ST elevation (NSTEMI) myocardial infarction: Secondary | ICD-10-CM | POA: Diagnosis not present

## 2020-01-04 DIAGNOSIS — Z955 Presence of coronary angioplasty implant and graft: Secondary | ICD-10-CM

## 2020-01-04 DIAGNOSIS — I5042 Chronic combined systolic (congestive) and diastolic (congestive) heart failure: Secondary | ICD-10-CM

## 2020-01-06 ENCOUNTER — Other Ambulatory Visit: Payer: Self-pay

## 2020-01-06 ENCOUNTER — Encounter (HOSPITAL_COMMUNITY)
Admission: RE | Admit: 2020-01-06 | Discharge: 2020-01-06 | Disposition: A | Discharge status: Home / Self Care | Payer: Medicare HMO | Source: Ambulatory Visit | Attending: Cardiovascular Disease | Admitting: Cardiovascular Disease

## 2020-01-06 DIAGNOSIS — Z955 Presence of coronary angioplasty implant and graft: Secondary | ICD-10-CM

## 2020-01-06 DIAGNOSIS — I214 Non-ST elevation (NSTEMI) myocardial infarction: Secondary | ICD-10-CM | POA: Diagnosis not present

## 2020-01-09 ENCOUNTER — Other Ambulatory Visit: Payer: Self-pay

## 2020-01-09 ENCOUNTER — Encounter (HOSPITAL_COMMUNITY)
Admission: RE | Admit: 2020-01-09 | Discharge: 2020-01-09 | Disposition: A | Discharge status: Home / Self Care | Payer: Medicare HMO | Source: Ambulatory Visit | Attending: Cardiovascular Disease | Admitting: Cardiovascular Disease

## 2020-01-09 DIAGNOSIS — Z955 Presence of coronary angioplasty implant and graft: Secondary | ICD-10-CM

## 2020-01-09 DIAGNOSIS — I214 Non-ST elevation (NSTEMI) myocardial infarction: Secondary | ICD-10-CM | POA: Diagnosis not present

## 2020-01-09 DIAGNOSIS — I5042 Chronic combined systolic (congestive) and diastolic (congestive) heart failure: Secondary | ICD-10-CM

## 2020-01-11 ENCOUNTER — Encounter (HOSPITAL_COMMUNITY): Payer: Medicare HMO

## 2020-01-13 ENCOUNTER — Other Ambulatory Visit: Payer: Self-pay

## 2020-01-13 ENCOUNTER — Encounter (HOSPITAL_COMMUNITY)
Admission: RE | Admit: 2020-01-13 | Discharge: 2020-01-13 | Disposition: A | Discharge status: Home / Self Care | Payer: Medicare HMO | Source: Ambulatory Visit | Attending: Cardiovascular Disease | Admitting: Cardiovascular Disease

## 2020-01-13 DIAGNOSIS — Z955 Presence of coronary angioplasty implant and graft: Secondary | ICD-10-CM

## 2020-01-13 DIAGNOSIS — I214 Non-ST elevation (NSTEMI) myocardial infarction: Secondary | ICD-10-CM

## 2020-01-16 ENCOUNTER — Other Ambulatory Visit: Payer: Self-pay

## 2020-01-16 ENCOUNTER — Encounter (HOSPITAL_COMMUNITY)
Admission: RE | Admit: 2020-01-16 | Discharge: 2020-01-16 | Disposition: A | Discharge status: Home / Self Care | Payer: Medicare HMO | Source: Ambulatory Visit | Attending: Cardiovascular Disease | Admitting: Cardiovascular Disease

## 2020-01-16 DIAGNOSIS — I214 Non-ST elevation (NSTEMI) myocardial infarction: Secondary | ICD-10-CM

## 2020-01-18 ENCOUNTER — Other Ambulatory Visit: Payer: Self-pay

## 2020-01-18 ENCOUNTER — Encounter (HOSPITAL_COMMUNITY)
Admission: RE | Admit: 2020-01-18 | Discharge: 2020-01-18 | Disposition: A | Discharge status: Home / Self Care | Payer: Medicare HMO | Source: Ambulatory Visit | Attending: Cardiovascular Disease | Admitting: Cardiovascular Disease

## 2020-01-18 VITALS — Ht 62.0 in | Wt 214.5 lb

## 2020-01-18 DIAGNOSIS — I5042 Chronic combined systolic (congestive) and diastolic (congestive) heart failure: Secondary | ICD-10-CM

## 2020-01-18 DIAGNOSIS — Z955 Presence of coronary angioplasty implant and graft: Secondary | ICD-10-CM

## 2020-01-18 DIAGNOSIS — I214 Non-ST elevation (NSTEMI) myocardial infarction: Secondary | ICD-10-CM | POA: Diagnosis not present

## 2020-01-20 ENCOUNTER — Encounter (HOSPITAL_COMMUNITY)
Admission: RE | Admit: 2020-01-20 | Discharge: 2020-01-20 | Disposition: A | Discharge status: Home / Self Care | Payer: Medicare HMO | Source: Ambulatory Visit | Attending: Cardiovascular Disease | Admitting: Cardiovascular Disease

## 2020-01-20 ENCOUNTER — Other Ambulatory Visit: Payer: Self-pay

## 2020-01-20 DIAGNOSIS — I214 Non-ST elevation (NSTEMI) myocardial infarction: Secondary | ICD-10-CM | POA: Diagnosis not present

## 2020-01-20 DIAGNOSIS — Z955 Presence of coronary angioplasty implant and graft: Secondary | ICD-10-CM

## 2020-01-25 ENCOUNTER — Ambulatory Visit (HOSPITAL_COMMUNITY): Payer: Medicare HMO | Attending: Cardiovascular Disease

## 2020-01-25 ENCOUNTER — Other Ambulatory Visit: Payer: Self-pay

## 2020-01-25 ENCOUNTER — Other Ambulatory Visit: Payer: Medicare HMO | Admitting: *Deleted

## 2020-01-25 DIAGNOSIS — I2510 Atherosclerotic heart disease of native coronary artery without angina pectoris: Secondary | ICD-10-CM

## 2020-01-25 DIAGNOSIS — I255 Ischemic cardiomyopathy: Secondary | ICD-10-CM | POA: Diagnosis not present

## 2020-01-25 DIAGNOSIS — I5042 Chronic combined systolic (congestive) and diastolic (congestive) heart failure: Secondary | ICD-10-CM

## 2020-01-25 DIAGNOSIS — E785 Hyperlipidemia, unspecified: Secondary | ICD-10-CM

## 2020-01-25 LAB — HEPATIC FUNCTION PANEL
ALT: 30 IU/L (ref 0–32)
AST: 20 IU/L (ref 0–40)
Albumin: 4.2 g/dL (ref 3.8–4.8)
Alkaline Phosphatase: 86 IU/L (ref 44–121)
Bilirubin Total: 0.6 mg/dL (ref 0.0–1.2)
Bilirubin, Direct: 0.2 mg/dL (ref 0.00–0.40)
Total Protein: 6.6 g/dL (ref 6.0–8.5)

## 2020-01-25 LAB — ECHOCARDIOGRAM COMPLETE
Area-P 1/2: 2.07 cm2
S' Lateral: 3.5 cm

## 2020-01-25 LAB — LIPID PANEL
Chol/HDL Ratio: 2.6 ratio (ref 0.0–4.4)
Cholesterol, Total: 92 mg/dL — ABNORMAL LOW (ref 100–199)
HDL: 36 mg/dL — ABNORMAL LOW (ref >39)
LDL Chol Calc (NIH): 38 mg/dL (ref 0–99)
Triglycerides: 89 mg/dL (ref 0–149)
VLDL Cholesterol Cal: 18 mg/dL (ref 5–40)

## 2020-01-25 MED ORDER — PERFLUTREN LIPID MICROSPHERE
1.0000 mL | INTRAVENOUS | Status: AC | PRN
  Administered 2020-01-25: 1 mL via INTRAVENOUS

## 2020-01-26 ENCOUNTER — Telehealth: Payer: Self-pay | Admitting: Physician Assistant

## 2020-01-26 MED ORDER — DICYCLOMINE HCL 10 MG PO CAPS: ORAL_CAPSULE | ORAL | 0 refills | Status: DC

## 2020-01-26 NOTE — Telephone Encounter (Signed)
Sent script to pharmacy. Patient needs an office visit.

## 2020-01-26 NOTE — Telephone Encounter (Signed)
Requesting refill on Bentyl

## 2020-01-30 ENCOUNTER — Encounter (INDEPENDENT_AMBULATORY_CARE_PROVIDER_SITE_OTHER): Payer: Self-pay | Admitting: Family Medicine

## 2020-01-30 ENCOUNTER — Ambulatory Visit (INDEPENDENT_AMBULATORY_CARE_PROVIDER_SITE_OTHER): Payer: Medicare HMO | Admitting: Family Medicine

## 2020-01-30 ENCOUNTER — Other Ambulatory Visit: Payer: Self-pay

## 2020-01-30 VITALS — BP 125/77 | HR 62 | Temp 98.1°F | Ht 62.0 in | Wt 208.0 lb

## 2020-01-30 DIAGNOSIS — G4733 Obstructive sleep apnea (adult) (pediatric): Secondary | ICD-10-CM

## 2020-01-30 DIAGNOSIS — R739 Hyperglycemia, unspecified: Secondary | ICD-10-CM

## 2020-01-30 DIAGNOSIS — E559 Vitamin D deficiency, unspecified: Secondary | ICD-10-CM

## 2020-01-30 DIAGNOSIS — Z0289 Encounter for other administrative examinations: Secondary | ICD-10-CM

## 2020-01-30 DIAGNOSIS — I2510 Atherosclerotic heart disease of native coronary artery without angina pectoris: Secondary | ICD-10-CM

## 2020-01-30 DIAGNOSIS — R5383 Other fatigue: Secondary | ICD-10-CM | POA: Diagnosis not present

## 2020-01-30 DIAGNOSIS — R0602 Shortness of breath: Secondary | ICD-10-CM | POA: Diagnosis not present

## 2020-01-30 DIAGNOSIS — Z1331 Encounter for screening for depression: Secondary | ICD-10-CM

## 2020-01-30 DIAGNOSIS — Z6838 Body mass index (BMI) 38.0-38.9, adult: Secondary | ICD-10-CM

## 2020-01-31 LAB — COMPREHENSIVE METABOLIC PANEL
ALT: 27 IU/L (ref 0–32)
AST: 18 IU/L (ref 0–40)
Albumin/Globulin Ratio: 1.7 (ref 1.2–2.2)
Albumin: 4.4 g/dL (ref 3.8–4.8)
Alkaline Phosphatase: 95 IU/L (ref 44–121)
BUN/Creatinine Ratio: 14 (ref 12–28)
BUN: 11 mg/dL (ref 8–27)
Bilirubin Total: 0.5 mg/dL (ref 0.0–1.2)
CO2: 21 mmol/L (ref 20–29)
Calcium: 9.8 mg/dL (ref 8.7–10.3)
Chloride: 107 mmol/L — ABNORMAL HIGH (ref 96–106)
Creatinine, Ser: 0.81 mg/dL (ref 0.57–1.00)
GFR calc Af Amer: 86 mL/min/{1.73_m2} (ref >59)
GFR calc non Af Amer: 75 mL/min/{1.73_m2} (ref >59)
Globulin, Total: 2.6 g/dL (ref 1.5–4.5)
Glucose: 107 mg/dL — ABNORMAL HIGH (ref 65–99)
Potassium: 4.3 mmol/L (ref 3.5–5.2)
Sodium: 142 mmol/L (ref 134–144)
Total Protein: 7 g/dL (ref 6.0–8.5)

## 2020-01-31 LAB — CBC WITH DIFFERENTIAL/PLATELET
Basophils Absolute: 0 10*3/uL (ref 0.0–0.2)
Basos: 0 %
EOS (ABSOLUTE): 0.2 10*3/uL (ref 0.0–0.4)
Eos: 3 %
Hematocrit: 46.6 % (ref 34.0–46.6)
Hemoglobin: 15.5 g/dL (ref 11.1–15.9)
Immature Grans (Abs): 0 10*3/uL (ref 0.0–0.1)
Immature Granulocytes: 0 %
Lymphocytes Absolute: 1.9 10*3/uL (ref 0.7–3.1)
Lymphs: 23 %
MCH: 31.9 pg (ref 26.6–33.0)
MCHC: 33.3 g/dL (ref 31.5–35.7)
MCV: 96 fL (ref 79–97)
Monocytes Absolute: 0.6 10*3/uL (ref 0.1–0.9)
Monocytes: 7 %
Neutrophils Absolute: 5.5 10*3/uL (ref 1.4–7.0)
Neutrophils: 67 %
Platelets: 328 10*3/uL (ref 150–450)
RBC: 4.86 x10E6/uL (ref 3.77–5.28)
RDW: 12.5 % (ref 11.7–15.4)
WBC: 8.2 10*3/uL (ref 3.4–10.8)

## 2020-01-31 LAB — T3: T3, Total: 131 ng/dL (ref 71–180)

## 2020-01-31 LAB — HEMOGLOBIN A1C
Est. average glucose Bld gHb Est-mCnc: 123 mg/dL
Hgb A1c MFr Bld: 5.9 % — ABNORMAL HIGH (ref 4.8–5.6)

## 2020-01-31 LAB — VITAMIN D 25 HYDROXY (VIT D DEFICIENCY, FRACTURES): Vit D, 25-Hydroxy: 40.3 ng/mL (ref 30.0–100.0)

## 2020-01-31 LAB — T4: T4, Total: 8.6 ug/dL (ref 4.5–12.0)

## 2020-01-31 LAB — TSH: TSH: 0.988 u[IU]/mL (ref 0.450–4.500)

## 2020-01-31 LAB — INSULIN, RANDOM: INSULIN: 41.3 u[IU]/mL — ABNORMAL HIGH (ref 2.6–24.9)

## 2020-02-03 NOTE — Progress Notes (Signed)
Discharge Progress Report  Patient Details  Name: Valerie Fields MRN: [id number] Date of Birth: 1950-09-05 Referring Provider:     [place] from 11/22/2019 in [place]  Referring Provider Lauree Chandler MD       Number of Visits: 18 of 24  Reason for Discharge:  Patient reached a stable level of exercise. Patient independent in their exercise. Patient has met program and personal goals.  Smoking History:  Social History   Tobacco Use  Smoking Status Never Smoker  Smokeless Tobacco Never Used    Diagnosis:  NSTEMI (non-ST elevated myocardial infarction) ([place]) 10/13/19  S/P DES mLAD 10/13/19  ADL [facility]SD:   Initial Exercise Prescription:  Initial Exercise Prescription - 11/22/19 1500      Date of Initial Exercise RX and Referring Provider   Date 11/22/19    Referring Provider Lauree Chandler MD    Expected Discharge Date 01/20/20      NuStep   Level 1    SPM 75    Minutes 25    METs 1.5      Prescription Details   Frequency (times per week) 3    Duration Progress to 30 minutes of continuous aerobic without signs/symptoms of physical distress      Intensity   THRR 40-80% of Max Heartrate 61-122    Ratings of Perceived Exertion 11-13    Perceived Dyspnea 0-4      Progression   Progression Continue progressive overload as per policy without signs/symptoms or physical distress.      Resistance Training   Training Prescription Yes    Weight 2 lbs    Reps 10-15           Discharge Exercise Prescription (Final Exercise Prescription Changes):  Exercise Prescription Changes - 01/20/20 1445      Response to Exercise   Blood Pressure (Admit) 116/62    Blood Pressure (Exercise) 124/70    Blood Pressure (Exit) 114/62    Heart Rate (Admit) 60 bpm    Heart Rate (Exercise) 102 bpm    Heart Rate (Exit) 64 bpm    Rating of Perceived Exertion (Exercise) 11    Symptoms None    Comments  Pt graduated from the [facility] program today.     Duration Continue with 30 min of aerobic exercise without signs/symptoms of physical distress.    Intensity THRR unchanged      Progression   Progression Continue to progress workloads to maintain intensity without signs/symptoms of physical distress.    Average METs 2.3      Resistance Training   Training Prescription Yes    Weight 3 lbs    Reps 10-15    Time 10 Minutes      Interval Training   Interval Training No      NuStep   Level 4    SPM 85    Minutes 15    METs 1.9      Track   Laps 15    Minutes 15    METs 2.74      Home Exercise Plan   Plans to continue exercise at Home (comment)    Frequency Add 3 additional days to program exercise sessions.    Initial Home Exercises Provided 12/16/19           Functional Capacity:  6 Minute Walk    Row Name 11/22/19 1514 01/13/20 1405       6 Minute Walk  Phase Initial Discharge    Distance 1428 feet 1735 feet    Distance % Change -- 21.5 %    Distance Feet Change -- 307 ft    Walk Time 6 minutes 6 minutes    # of Rest Breaks 0 0    MPH 2.7 3.29    METS 2.52 3.4    RPE 13 12    Perceived Dyspnea  1 0    VO2 Peak 8.82 11.86    Symptoms Yes (comment) No    Comments SOB due to mask, RPD =1.  Pt c/o indigestion in left upper back # 2 on 0-10 scale. EKG ordered. --    Resting HR 79 bpm 82 bpm    Resting BP 104/72 110/72    Resting Oxygen Saturation  98 % 98 %    Exercise Oxygen Saturation  during 6 min walk 97 % 98 %    Max Ex. HR 91 bpm 114 bpm    Max Ex. BP 132/68 144/76    2 Minute Post BP 116/66 --           Psychological, QOL, Others - Outcomes: PHQ 2/9: Depression screen [facility] 2/9 01/30/2020 11/22/2019  Decreased Interest 1 0  Down, Depressed, Hopeless 1 0  PHQ - 2 Score 2 0  Altered sleeping 3 -  Tired, decreased energy 1 -  Change in appetite 1 -  Feeling bad or failure about yourself  0 -  Trouble concentrating 0 -  Moving slowly or  fidgety/restless 0 -  Suicidal thoughts 0 -  PHQ-9 Score 7 -  Difficult doing work/chores Somewhat difficult -    Quality of Life:  Quality of Life - 01/18/20 1450      Quality of Life Scores   Health/Function Post 28.71 %    Socioeconomic Post 30 %    Psych/Spiritual Post 29.14 %    Family Post 30 %    GLOBAL Post 29.25 %           Personal Goals: Goals established at orientation with interventions provided to work toward goal.  Personal Goals and Risk Factors at Admission - 11/22/19 1552      Core Components/Risk Factors/Patient Goals on Admission    Weight Management Yes;Obesity;Weight Loss    Admit Weight 215 lb 2.7 oz (97.6 kg)    Expected Outcomes Short Term: Continue to assess and modify interventions until short term weight is achieved;Long Term: Adherence to nutrition and physical activity/exercise program aimed toward attainment of established weight goal;Weight Maintenance: Understanding of the daily nutrition guidelines, which includes 25-35% calories from fat, 7% or less cal from saturated fats, less than 276m cholesterol, less than 1.5gm of sodium, & 5 or more servings of fruits and vegetables daily;Weight Loss: Understanding of general recommendations for a balanced deficit meal plan, which promotes 1-2 lb weight loss per week and includes a negative energy balance of [phone] kcal/d;Understanding recommendations for meals to include 15-35% energy as protein, 25-35% energy from fat, 35-60% energy from carbohydrates, less than 2068mof dietary cholesterol, 20-35 gm of total fiber daily;Understanding of distribution of calorie intake throughout the day with the consumption of 4-5 meals/snacks    Lipids Yes    Intervention Provide education and support for participant on nutrition & aerobic/resistive exercise along with prescribed medications to achieve LDL <7054mHDL >68m74m  Expected Outcomes Short Term: Participant states understanding of desired cholesterol values and  is compliant with medications prescribed. Participant is following exercise prescription and  nutrition guidelines.;Long Term: Cholesterol controlled with medications as prescribed, with individualized exercise RX and with personalized nutrition plan. Value goals: LDL < 85m, HDL > 40 mg.    Stress Yes    Intervention Offer individual and/or small group education and counseling on adjustment to heart disease, stress management and health-related lifestyle change. Teach and support self-help strategies.;Refer participants experiencing significant psychosocial distress to appropriate mental health specialists for further evaluation and treatment. When possible, include family members and significant others in education/counseling sessions.    Expected Outcomes Short Term: Participant demonstrates changes in health-related behavior, relaxation and other stress management skills, ability to obtain effective social support, and compliance with psychotropic medications if prescribed.;Long Term: Emotional wellbeing is indicated by absence of clinically significant psychosocial distress or social isolation.            Personal Goals Discharge:  Goals and Risk Factor Review    Row Name 11/29/19 1136 12/06/19 1402 12/27/19 1129 02/03/20 0936       Core Components/Risk Factors/Patient Goals Review   Personal Goals Review Weight Management/Obesity;Lipids;Stress Weight Management/Obesity;Lipids;Stress Weight Management/Obesity;Lipids Weight Management/Obesity;Lipids    Review Ms. FTullhas multiple CAD risk factors. She is eager to participate in CR for risk factor reduction. Her personal goals are to "get back her stamina and strength" and to gain confidence in her health. Ms. FBottshas multiple CAD risk factors. She is eager to participate in CR for risk factor reduction. Her personal goals are to "get back her stamina and strength" and to gain confidence in her health. Ms. FKnepphas multiple CAD risk  factors. She remains eager to participate in CR for exercise and risk factor reduction. Her personal goals are to "get back her stamina and strength" and to gain confidence in her health. She is currently on track to meet these goals by the end of her 8 week enrollment. Ms. FCuthbertmet her personal goals of regaining her stamina and strength and gaining confidence in her health. She met with RD to gain education on healthy eating and weight loss. Her weight remained stable during enrollment however she met with RD to for lipid lowering and weight loss diet.    Expected Outcomes Patient will continue to participate in CR for risk factor reduction. Patient will continue to participate in CR for risk factor reduction. Patient will continue to participate in CR for risk factor reduction. Patient will continue to exercise and utilize education recieved in CR to reduce risk factors. She will continue to take medications as prescribed, adhere to medical appointments and heart healthy diet post discharge from cardiac rehab.           Exercise Goals and Review:  Exercise Goals    Row Name 11/22/19 1440             Exercise Goals   Increase Physical Activity Yes       Intervention Provide advice, education, support and counseling about physical activity/exercise needs.;Develop an individualized exercise prescription for aerobic and resistive training based on initial evaluation findings, risk stratification, comorbidities and participant's personal goals.       Expected Outcomes Short Term: Attend rehab on a regular basis to increase amount of physical activity.;Long Term: Add in home exercise to make exercise part of routine and to increase amount of physical activity.;Long Term: Exercising regularly at least 3-5 days a week.       Increase Strength and Stamina Yes       Intervention Provide advice, education, support and  counseling about physical activity/exercise needs.;Develop an individualized  exercise prescription for aerobic and resistive training based on initial evaluation findings, risk stratification, comorbidities and participant's personal goals.       Expected Outcomes Short Term: Increase workloads from initial exercise prescription for resistance, speed, and METs.;Short Term: Perform resistance training exercises routinely during rehab and add in resistance training at home;Long Term: Improve cardiorespiratory fitness, muscular endurance and strength as measured by increased METs and functional capacity (6MWT)       Able to understand and use rate of perceived exertion (RPE) scale Yes       Intervention Provide education and explanation on how to use RPE scale       Expected Outcomes Short Term: Able to use RPE daily in rehab to express subjective intensity level;Long Term:  Able to use RPE to guide intensity level when exercising independently       Knowledge and understanding of Target Heart Rate Range (THRR) Yes       Intervention Provide education and explanation of THRR including how the numbers were predicted and where they are located for reference       Expected Outcomes Short Term: Able to state/look up THRR;Short Term: Able to use daily as guideline for intensity in rehab;Long Term: Able to use THRR to govern intensity when exercising independently       Able to check pulse independently Yes       Intervention Provide education and demonstration on how to check pulse in carotid and radial arteries.;Review the importance of being able to check your own pulse for safety during independent exercise       Expected Outcomes Short Term: Able to explain why pulse checking is important during independent exercise;Long Term: Able to check pulse independently and accurately       Understanding of Exercise Prescription Yes       Intervention Provide education, explanation, and written materials on patient's individual exercise prescription       Expected Outcomes Short Term: Able to  explain program exercise prescription;Long Term: Able to explain home exercise prescription to exercise independently              Exercise Goals Re-Evaluation:  Exercise Goals Re-Evaluation    Row Name 11/28/19 1446 12/16/19 1445 01/09/20 1420 01/20/20 1650       Exercise Goal Re-Evaluation   Exercise Goals Review Increase Physical Activity;Increase Strength and Stamina;Able to understand and use rate of perceived exertion (RPE) scale;Knowledge and understanding of Target Heart Rate Range (THRR);Able to check pulse independently;Understanding of Exercise Prescription Increase Physical Activity;Increase Strength and Stamina;Able to understand and use rate of perceived exertion (RPE) scale;Knowledge and understanding of Target Heart Rate Range (THRR);Able to check pulse independently;Understanding of Exercise Prescription Increase Physical Activity;Increase Strength and Stamina;Able to understand and use rate of perceived exertion (RPE) scale;Knowledge and understanding of Target Heart Rate Range (THRR);Able to check pulse independently;Understanding of Exercise Prescription Increase Physical Activity;Increase Strength and Stamina;Able to understand and use rate of perceived exertion (RPE) scale;Knowledge and understanding of Target Heart Rate Range (THRR);Able to check pulse independently;Understanding of Exercise Prescription    Comments Pt's first day of exercise in the [facility] program. Pt tolerated exercise well with no complaints and verbalizes understnading of Exercise Rx, RPE scale, and THR. Reviewed home exercise Rx today with patient. Pt does chair yoga 2x/week, and she will add walking 2x/week for 30 minutes. Reviwed METs and Goals. Pt making slow progress. Continue to encourage walking at home 2x/week in  addtion to the [facility] program. Pt graduated from the [facility] program today. Pt progressed well through the program and had an average MET level of 2.3. She will continue to exercise at home by  walking, chair yoga, and attending a cardio-resistance class 3x/week.    Expected Outcomes Will continue to monitor patient and progress workloads as tolerated. Pt will walk 2x/week at home for 30 minutes. Will Continue to progress pt as tolerated. Pt will continue to exercise at home on her own.           Nutrition & Weight - Outcomes:  Pre Biometrics - 11/22/19 1330      Pre Biometrics   Waist Circumference 44 inches    Hip Circumference 53.25 inches    Waist to Hip Ratio 0.83 %    Triceps Skinfold 32 mm    % Body Fat 49.6 %    Grip Strength 32 kg    Flexibility 12 in    Single Leg Stand 29.85 seconds           Post Biometrics - 01/18/20 1340       Post  Biometrics   Height '5\' 2"'  (1.575 m)    Weight 97.3 kg    Waist Circumference 44 inches    Hip Circumference 54 inches    Waist to Hip Ratio 0.81 %    BMI (Calculated) 39.22    Triceps Skinfold 32 mm    % Body Fat 49.5 %    Grip Strength 32 kg    Flexibility 12 in    Single Leg Stand 20.93 seconds           Nutrition:  Nutrition Therapy & Goals - 12/07/19 1453      Nutrition Therapy   Diet Heart Healthy    Drug/Food Interactions Statins/Certain Fruits      Personal Nutrition Goals   Nutrition Goal Pt to build a healthy plate including vegetables, fruits, whole grains, and low-fat dairy products in a heart healthy meal plan.    Personal Goal #2 Pt to identify food quantities necessary to achieve weight loss of 6-24 lb at graduation from cardiac rehab.      Intervention Plan   Intervention Prescribe, educate and counsel regarding individualized specific dietary modifications aiming towards targeted core components such as weight, hypertension, lipid management, diabetes, heart failure and other comorbidities.    Expected Outcomes Short Term Goal: Understand basic principles of dietary content, such as calories, fat, sodium, cholesterol and nutrients.           Nutrition Discharge:  Nutrition Assessments -  01/31/20 1044      MEDFICTS Scores   Pre Score 19    Post Score 6    Score Difference -13           Education Questionnaire Score:  Knowledge Questionnaire Score - 01/18/20 1450      Knowledge Questionnaire Score   Post Score 22/24           Goals reviewed with patient; copy given to patient.

## 2020-02-06 NOTE — Progress Notes (Signed)
Dear Valerie Burn, PA-C,   Thank you for referring Valerie Fields to our clinic. The following note includes my evaluation and treatment recommendations.  Chief Complaint:   OBESITY Valerie Fields (MR# [id number]) is a 69 y.o. female who presents for evaluation and treatment of obesity and related comorbidities. Current BMI is Body mass index is 38.04 kg/m. Valerie Fields has been struggling with her weight for many years and has been unsuccessful in either losing weight, maintaining weight loss, or reaching her healthy weight goal.  Valerie Fields has lactose intolerance but tolerates dairy. Eating out 2-3 times per week. Almond wheat cheerios with almond milk (1 cup). Coffee with almond milk creamer. Lunch-grilled chicken (4-5 strips) with salad with dressing. 4 pm-1/2 cap of peanuts, 1 apple and 1 date. Dinner-soup with lentils and peas, and ground beef and 1 slice of bread, and a occasional after dinner snack.  Valerie Fields is currently in the action stage of change and ready to dedicate time achieving and maintaining a healthier weight. Valerie Fields is interested in becoming our patient and working on intensive lifestyle modifications including (but not limited to) diet and exercise for weight loss.  Valerie Fields's habits were reviewed today and are as follows: her desired weight loss is 38-48 lbs, she started gaining weight after kids and a bad marriage, her heaviest weight ever was 225 pounds, she has significant food cravings issues, she is frequently drinking liquids with calories and she struggles with emotional eating.  Depression Screen Vonette's Food and Mood (modified PHQ-9) score was 7.  Depression screen PHQ 2/9 01/30/2020  Decreased Interest 1  Down, Depressed, Hopeless 1  PHQ - 2 Score 2  Altered sleeping 3  Tired, decreased energy 1  Change in appetite 1  Feeling bad or failure about yourself  0  Trouble concentrating 0  Moving slowly or fidgety/restless 0  Suicidal thoughts 0  PHQ-9 Score  7  Difficult doing work/chores Somewhat difficult   Subjective:   1. Other fatigue Valerie Fields admits to daytime somnolence and admits to waking up still tired. Patent has a history of symptoms of daytime fatigue. Valerie Fields generally gets 8 hours of sleep per night, and states that she has nightime awakenings. Snoring is present. Apneic episodes are present. Epworth Sleepiness Score is 5. EKG-normal sinus rhythm with abnormal QRS but unchanged from 11/22/2019.  2. SOB (shortness of breath) on exertion Shalla notes increasing shortness of breath with exercising and seems to be worsening over time with weight gain. She notes getting out of breath sooner with activity than she used to. This has not gotten worse recently. Valerie Fields denies shortness of breath at rest or orthopnea.  3. Coronary artery disease, unspecified vessel or lesion type, unspecified whether angina present, unspecified whether native or transplanted heart Valerie Fields had myocardial infarction on 10/15/2019. She is on statin, metoprolol, losartan, Brilinta, and aspirin.  4. OSA (obstructive sleep apnea) [name] is getting her CPAP tomorrow. She sees a sleep physician at [facility].  5. Hyperglycemia Valerie Fields's blood sugars range between 118 and 165. She has no history of pre-diabetes.  6. Vitamin D deficiency Valerie Fields's diagnosis is likely given obesity, and she notes fatigue.  Assessment/Plan:   1. Other fatigue Valerie Fields does feel that her weight is causing her energy to be lower than it should be. Fatigue may be related to obesity, depression or many other causes. Labs will be ordered, and in the meanwhile, Justice will focus on self care including making healthy food choices, increasing physical activity and  focusing on stress reduction.  - EKG 12-Lead - Comprehensive metabolic panel - T3 - T4 - TSH  2. SOB (shortness of breath) on exertion Valerie Fields does feel that she gets out of breath more easily that she used to when she exercises. Valerie Fields's shortness of breath  appears to be obesity related and exercise induced. She has agreed to work on weight loss and gradually increase exercise to treat her exercise induced shortness of breath. Will continue to monitor closely.  - CBC with Differential/Platelet  3. Coronary artery disease, unspecified vessel or lesion type, unspecified whether angina present, unspecified whether native or transplanted heart Valerie Fields will continue to follow up with Cardiology.  4. OSA (obstructive sleep apnea) Intensive lifestyle modifications are the first line treatment for this issue. We discussed several lifestyle modifications today. Valerie Fields will continue to work on diet, exercise and weight loss efforts. We will continue to monitor. We will follow up at her next appointment. Orders and follow up as documented in patient record.   5. Hyperglycemia Fasting labs will be obtained today, and results with be discussed with Clarise Cruz in 2 weeks at her follow up visit. In the meanwhile Valerie Fields was started on a lower simple carbohydrate diet and will work on weight loss efforts.  - Hemoglobin A1c - Insulin, random  6. Vitamin D deficiency Low Vitamin D level contributes to fatigue and are associated with obesity, breast, and colon cancer. We will check labs today. Valerie Fields will follow-up for routine testing of Vitamin D, at least 2-3 times per year to avoid over-replacement.  - VITAMIN D 25 Hydroxy (Vit-D Deficiency, Fractures)  7. Depression screening Valerie Fields had a positive depression screening. Depression is commonly associated with obesity and often results in emotional eating behaviors. We will monitor this closely and work on CBT to help improve the non-hunger eating patterns. Referral to Psychology may be required if no improvement is seen as she continues in our clinic.  8. Class 2 severe obesity with serious comorbidity and body mass index (BMI) of 38.0 to 38.9 in adult, unspecified obesity type Valerie Fields) Valerie Fields is currently in the action stage of  change and her goal is to continue with weight loss efforts. I recommend Valerie Fields begin the structured treatment plan as follows:  She has agreed to the Category 2 Plan.  Exercise goals: No exercise has been prescribed at this time.   Behavioral modification strategies: increasing lean protein intake, meal planning and cooking strategies, keeping healthy foods in the home and planning for success.  She was informed of the importance of frequent follow-up visits to maximize her success with intensive lifestyle modifications for her multiple health conditions. She was informed we would discuss her lab results at her next visit unless there is a critical issue that needs to be addressed sooner. Kemari agreed to keep her next visit at the agreed upon time to discuss these results.  Objective:   Blood pressure 125/77, pulse 62, temperature 98.1 F (36.7 C), temperature source Oral, height 5\' 2"  (1.575 m), weight 208 lb (94.3 kg), SpO2 98 %. Body mass index is 38.04 kg/m.  EKG: Normal sinus rhythm, rate 65 BPM.  Indirect Calorimeter completed today shows a VO2 of 198 and a REE of 1377.  Her calculated basal metabolic rate is 4970 thus her basal metabolic rate is worse than expected.  General: Cooperative, alert, well developed, in no acute distress. HEENT: Conjunctivae and lids unremarkable. Cardiovascular: Regular rhythm.  Lungs: Normal work of breathing. Neurologic: No focal deficits.  Lab Results  Component Value Date   CREATININE 0.81 01/30/2020   BUN 11 01/30/2020   NA 142 01/30/2020   K 4.3 01/30/2020   CL 107 (H) 01/30/2020   CO2 21 01/30/2020   Lab Results  Component Value Date   ALT 27 01/30/2020   AST 18 01/30/2020   ALKPHOS 95 01/30/2020   BILITOT 0.5 01/30/2020   Lab Results  Component Value Date   HGBA1C 5.9 (H) 01/30/2020   HGBA1C 5.6 10/13/2019   Lab Results  Component Value Date   INSULIN 41.3 (H) 01/30/2020   Lab Results  Component Value Date   TSH 0.988  01/30/2020   Lab Results  Component Value Date   CHOL 92 (L) 01/25/2020   HDL 36 (L) 01/25/2020   LDLCALC 38 01/25/2020   TRIG 89 01/25/2020   CHOLHDL 2.6 01/25/2020   Lab Results  Component Value Date   WBC 8.2 01/30/2020   HGB 15.5 01/30/2020   HCT 46.6 01/30/2020   MCV 96 01/30/2020   PLT 328 01/30/2020   No results found for: IRON, TIBC, FERRITIN Obesity Behavioral Intervention:   Approximately 15 minutes were spent on the discussion below.  ASK: We discussed the diagnosis of obesity with Elizah today and Brizza agreed to give [place] permission to discuss obesity behavioral modification therapy today.  ASSESS: Shallen has the diagnosis of obesity and her BMI today is 38.03. Oriana is in the action stage of change.   ADVISE: Bani was educated on the multiple health risks of obesity as well as the benefit of weight loss to improve her health. She was advised of the need for long term treatment and the importance of lifestyle modifications to improve her current health and to decrease her risk of future health problems.  AGREE: Multiple dietary modification options and treatment options were discussed and Etsuko agreed to follow the recommendations documented in the above note.  ARRANGE: Eddy was educated on the importance of frequent visits to treat obesity as outlined per CMS and USPSTF guidelines and agreed to schedule her next follow up appointment today.  Attestation Statements:   Reviewed by clinician on day of visit: allergies, medications, problem list, medical history, surgical history, family history, social history, and previous encounter notes.   I, Trixie Dredge, am acting as transcriptionist for Ilene Qua, MD.  This is the patient's first visit at [facility]. The patient's NEW PATIENT PACKET was reviewed at length. Included in the packet: current and past health history, medications, allergies, ROS, gynecologic history (women only), surgical  history, family history, social history, weight history, weight loss surgery history (for those that have had weight loss surgery), nutritional evaluation, mood and food questionnaire, PHQ9, Epworth questionnaire, sleep habits questionnaire, patient life and health improvement goals questionnaire. These will all be scanned into the patient's chart under media.   During the visit, I independently reviewed the patient's EKG, bioimpedance scale results, and indirect calorimeter results. I used this information to tailor a meal plan for the patient that will help her to lose weight and will improve her obesity-related conditions going forward. I performed a medically necessary appropriate examination and/or evaluation. I discussed the assessment and treatment plan with the patient. The patient was provided an opportunity to ask questions and all were answered. The patient agreed with the plan and demonstrated an understanding of the instructions. Labs were ordered at this visit and will be reviewed at the next visit unless more critical results need to be addressed  immediately. Clinical information was updated and documented in the EMR.   Time spent on visit including pre-visit chart review and post-visit care was 45 minutes.   A separate 15 minutes was spent on risk counseling (see above).  I have reviewed the above documentation for accuracy and completeness, and I agree with the above. - Jinny Blossom, MD

## 2020-02-07 ENCOUNTER — Encounter (INDEPENDENT_AMBULATORY_CARE_PROVIDER_SITE_OTHER): Payer: Self-pay | Admitting: Family Medicine

## 2020-02-07 NOTE — Telephone Encounter (Signed)
Please advise 

## 2020-02-13 ENCOUNTER — Ambulatory Visit (INDEPENDENT_AMBULATORY_CARE_PROVIDER_SITE_OTHER): Payer: Medicare HMO | Admitting: Cardiovascular Disease

## 2020-02-13 ENCOUNTER — Other Ambulatory Visit: Payer: Self-pay

## 2020-02-13 ENCOUNTER — Ambulatory Visit (INDEPENDENT_AMBULATORY_CARE_PROVIDER_SITE_OTHER): Payer: Medicare HMO | Admitting: Family Medicine

## 2020-02-13 ENCOUNTER — Encounter: Payer: Self-pay | Admitting: Cardiovascular Disease

## 2020-02-13 ENCOUNTER — Encounter (INDEPENDENT_AMBULATORY_CARE_PROVIDER_SITE_OTHER): Payer: Self-pay | Admitting: Family Medicine

## 2020-02-13 VITALS — BP 111/70 | HR 61 | Temp 98.0°F | Ht 62.0 in | Wt 208.0 lb

## 2020-02-13 VITALS — BP 126/76 | HR 75 | Ht 62.0 in | Wt 213.0 lb

## 2020-02-13 DIAGNOSIS — I255 Ischemic cardiomyopathy: Secondary | ICD-10-CM | POA: Diagnosis not present

## 2020-02-13 DIAGNOSIS — E785 Hyperlipidemia, unspecified: Secondary | ICD-10-CM | POA: Diagnosis not present

## 2020-02-13 DIAGNOSIS — I2510 Atherosclerotic heart disease of native coronary artery without angina pectoris: Secondary | ICD-10-CM

## 2020-02-13 DIAGNOSIS — E559 Vitamin D deficiency, unspecified: Secondary | ICD-10-CM | POA: Diagnosis not present

## 2020-02-13 DIAGNOSIS — I5042 Chronic combined systolic (congestive) and diastolic (congestive) heart failure: Secondary | ICD-10-CM | POA: Diagnosis not present

## 2020-02-13 DIAGNOSIS — Z6838 Body mass index (BMI) 38.0-38.9, adult: Secondary | ICD-10-CM | POA: Diagnosis not present

## 2020-02-13 DIAGNOSIS — R7303 Prediabetes: Secondary | ICD-10-CM | POA: Diagnosis not present

## 2020-02-13 MED ORDER — VITAMIN D (ERGOCALCIFEROL) 1.25 MG (50000 UNIT) PO CAPS: 50000.0000 [IU] | ORAL_CAPSULE | ORAL | 0 refills | Status: DC

## 2020-02-13 NOTE — Patient Instructions (Signed)
Medication Instructions:  Your physician recommends that you continue on your current medications as directed. Please refer to the Current Medication list given to you today.  *If you need a refill on your cardiac medications before your next appointment, please call your pharmacy*   Lab Work: None If you have labs (blood work) drawn today and your tests are completely normal, you will receive your results only by: Marland Kitchen MyChart Message (if you have MyChart) OR . A paper copy in the mail If you have any lab test that is abnormal or we need to change your treatment, we will call you to review the results.   Testing/Procedures: None   Follow-Up: At [facility], you and your health needs are our priority.  As part of our continuing mission to provide you with exceptional heart care, we have created designated Provider Care Teams.  These Care Teams include your primary Cardiologist (physician) and Advanced Practice Providers (APPs -  Physician Assistants and Nurse Practitioners) who all work together to provide you with the care you need, when you need it.  We recommend signing up for the patient portal called "MyChart".  Sign up information is provided on this After Visit Summary.  MyChart is used to connect with patients for Virtual Visits (Telemedicine).  Patients are able to view lab/test results, encounter notes, upcoming appointments, etc.  Non-urgent messages can be sent to your provider as well.   To learn more about what you can do with MyChart, go to [email or web address].    Your next appointment:   8 month(s)  The format for your next appointment:   In Person  Provider:   You may see Lauree Chandler, MD or one of the following Advanced Practice Providers on your designated Care Team:    Melina Copa, PA-C  Ermalinda Barrios, PA-C    Other Instructions

## 2020-02-13 NOTE — Progress Notes (Signed)
Chief Complaint  Patient presents with   Follow-up    CAD   History of Present Illness: 69 yo female with history of CAD, ischemic cardiomyopathy, asthma, sleep apnea and GERD who is here today for cardiac follow up. She was admitted to [facility] July 2021 with a NSTEMI. Cardiac cath with severe mid LAD stenosis involving a Diagonal. The LAD was treated with a stent and the Diagonal was treated with balloon angioplasty. Echo post MI with LVEF=40-45%. Most recent echo 01/25/20 with LVEF=55-60%. No significant valve disease. She has been treated with Protonix for GERD. She is now on CPAP for sleep apnea.   She is here today for follow up. The patient denies any chest pain, dyspnea, palpitations, lower extremity edema, orthopnea, PND, dizziness, near syncope or syncope.   Primary Care Physician: Marda Stalker, PA-C   Past Medical History:  Diagnosis Date   Allergy    Asthma    Chronic combined systolic and diastolic CHF 76/54/6503   Ischemic CM // Echocardiogram 7/21: EF 40-45, Gr 2 DD, mid to apical ant/ant-sept AK, RVSP 23.4, trivial MR   Coronary artery disease    NSTEMI 7/21: oLAD 30, pLAD 99 >> PCI: DES; D1 90 >> PCI: POBA, prox and mid RCA 15, EF 35   Diverticulitis    Gallbladder problem    GERD (gastroesophageal reflux disease)    History of heart attack    Hyperlipidemia 10/15/2019   Zetia DC'd during admit for NSTEMI 7/21 >> ? to Atorvastatin   IBS (irritable colon syndrome)    Ischemic cardiomyopathy 10/15/2019   Joint pain    Lactose intolerance    Multiple food allergies    Non-ST elevation (NSTEMI) MI 10/13/2019   PCI:  DES to LAD and POBA to D1    Past Surgical History:  Procedure Laterality Date   BREAST BIOPSY Left    No Scar seen    BREAST CYST EXCISION Left    No scar seen    CARDIAC CATHETERIZATION     COLONOSCOPY     CORONARY STENT INTERVENTION  10/13/2019   CORONARY STENT INTERVENTION N/A 10/13/2019   Procedure: CORONARY STENT  INTERVENTION;  Surgeon: Nelva Bush, MD;  Location: [facility];  Service: Cardiovascular;  Laterality: N/A;  mid lad   LEFT HEART CATH AND CORONARY ANGIOGRAPHY N/A 10/13/2019   Procedure: LEFT HEART CATH AND CORONARY ANGIOGRAPHY;  Surgeon: Nelva Bush, MD;  Location: [facility];  Service: Cardiovascular;  Laterality: N/A;   TONSILLECTOMY      Current Outpatient Medications  Medication Sig Dispense Refill   Acetaminophen (TYLENOL ARTHRITIS PAIN PO) Take by mouth as needed.     aspirin 81 MG chewable tablet Chew 1 tablet (81 mg total) by mouth daily.     atorvastatin (LIPITOR) 80 MG tablet Take 1 tablet (80 mg total) by mouth daily. 30 tablet 11   azelastine (ASTELIN) 0.1 % nasal spray Place 1 spray into both nostrils daily. Use in each nostril as directed      Chlorpheniramine Maleate (ALLERGY RELIEF PO) Take by mouth.     dicyclomine (BENTYL) 10 MG capsule Take 1 tablet by mouth every 6 hours as needed. 30 capsule 0   diphenhydrAMINE (SLEEP AID, DIPHENHYDRAMINE,) 25 MG tablet Take 25 mg by mouth at bedtime as needed for sleep.     diphenhydrAMINE HCl (ALLERGY MED PO) Take by mouth.     docusate sodium (STOOL SOFTENER) 100 MG capsule Take 100 mg by mouth daily.  losartan (COZAAR) 25 MG tablet Take 1 tablet (25 mg total) by mouth daily. 30 tablet 11   melatonin 3 MG TABS tablet Take 6 mg by mouth at bedtime.     metoprolol succinate (TOPROL-XL) 25 MG 24 hr tablet Take 1 tablet (25 mg total) by mouth daily. 30 tablet 11   montelukast (SINGULAIR) 10 MG tablet Take 10 mg by mouth at bedtime.   3   nitroGLYCERIN (NITROSTAT) 0.4 MG SL tablet Place 1 tablet (0.4 mg total) under the tongue every 5 (five) minutes as needed for chest pain. 25 tablet 12   pantoprazole (PROTONIX) 40 MG tablet Take 40 mg by mouth as needed.     psyllium (REGULOID) 0.52 g capsule Take 1.5 g by mouth daily.     ticagrelor (BRILINTA) 90 MG TABS tablet Take 1 tablet (90 mg total)  by mouth 2 (two) times daily. 180 tablet 3   No current facility-administered medications for this visit.    Allergies  Allergen Reactions   Soy Allergy Hives   Sulfa Antibiotics Other (See Comments)    As a child, turned red above neck    Social History   Socioeconomic History   Marital status: Divorced    Spouse name: Not on file   Number of children: 2   Years of education: 16   Highest education level: Not on file  Occupational History   Occupation: Retired  Tobacco Use   Smoking status: Never Smoker   Smokeless tobacco: Never Used  Scientific laboratory technician Use: Never used  Substance and Sexual Activity   Alcohol use: Yes    Alcohol/week: 1.0 standard drink    Types: 1 Glasses of wine per week   Drug use: No   Sexual activity: Not Currently  Other Topics Concern   Not on file  Social History Narrative   Not on file   Social Determinants of Health   Financial Resource Strain:    Difficulty of Paying Living Expenses: Not on file  Food Insecurity:    Worried About [place] in the Last Year: Not on file   Ran Out of Food in the Last Year: Not on file  Transportation Needs:    Lack of Transportation (Medical): Not on file   Lack of Transportation (Non-Medical): Not on file  Physical Activity:    Days of Exercise per Week: Not on file   Minutes of Exercise per Session: Not on file  Stress:    Feeling of Stress : Not on file  Social Connections:    Frequency of Communication with Friends and Family: Not on file   Frequency of Social Gatherings with Friends and Family: Not on file   Attends Religious Services: Not on file   Active Member of Clubs or Organizations: Not on file   Attends Archivist Meetings: Not on file   Marital Status: Not on file  Intimate Partner Violence:    Fear of Current or Ex-Partner: Not on file   Emotionally Abused: Not on file   Physically Abused: Not on file   Sexually Abused: Not  on file    Family History  Problem Relation Age of Onset   GER disease Father    Aneurysm Mother    Hyperlipidemia Mother    Obesity Mother    Stomach cancer Maternal Grandfather    Colon cancer Neg Hx    Esophageal cancer Neg Hx    Pancreatic cancer Neg Hx    Rectal  cancer Neg Hx    Breast cancer Neg Hx     Review of Systems:  As stated in the HPI and otherwise negative.   BP 126/76    Pulse 75    Ht 5\' 2"  (1.575 m)    Wt 213 lb (96.6 kg)    SpO2 97%    BMI 38.96 kg/m   Physical Examination: General: Well developed, well nourished, NAD  HEENT: OP clear, mucus membranes moist  SKIN: warm, dry. No rashes. Neuro: No focal deficits  Musculoskeletal: Muscle strength 5/5 all ext  Psychiatric: Mood and affect normal  Neck: No JVD, no carotid bruits, no thyromegaly, no lymphadenopathy.  Lungs:Clear bilaterally, no wheezes, rhonci, crackles Cardiovascular: Regular rate and rhythm. No murmurs, gallops or rubs. Abdomen:Soft. Bowel sounds present. Non-tender.  Extremities: No lower extremity edema. Pulses are 2 + in the bilateral DP/PT.  EKG:  EKG is not ordered today. The ekg ordered today demonstrates   Echo 01/25/20: 1. Left ventricular ejection fraction, by estimation, is 55 to 60%. The  left ventricle has normal function. The left ventricle has no regional  wall motion abnormalities. Left ventricular diastolic parameters are  consistent with Grade II diastolic  dysfunction (pseudonormalization).  2. Right ventricular systolic function is normal. The right ventricular  size is normal. There is normal pulmonary artery systolic pressure.  3. Left atrial size was mildly dilated.  4. The mitral valve is abnormal. Trivial mitral valve regurgitation. No  evidence of mitral stenosis. Moderate mitral annular calcification.  5. The aortic valve is normal in structure. Aortic valve regurgitation is  not visualized. No aortic stenosis is present.  6. The inferior vena  cava is normal in size with greater than 50%  respiratory variability, suggesting right atrial pressure of 3 mmHg.   Recent Labs: 01/30/2020: ALT 27; BUN 11; Creatinine, Ser 0.81; Hemoglobin 15.5; Platelets 328; Potassium 4.3; Sodium 142; TSH 0.988   Lipid Panel    Component Value Date/Time   CHOL 92 (L) 01/25/2020 0833   TRIG 89 01/25/2020 0833   HDL 36 (L) 01/25/2020 0833   CHOLHDL 2.6 01/25/2020 0833   CHOLHDL 3.7 10/13/2019 1502   VLDL 32 10/13/2019 1502   LDLCALC 38 01/25/2020 0833     Wt Readings from Last 3 Encounters:  02/13/20 213 lb (96.6 kg)  01/30/20 208 lb (94.3 kg)  01/18/20 214 lb 8.1 oz (97.3 kg)    Assessment and Plan:   1. CAD without angina: She has no chest pain. Will continue ASA, Brilinta, statin and beta blocker.   2. Ischemic cardiomyopathy: LVEF now normal. Continue beta blocker and ARB.   3. Chronic diastolic CHF: No volume overload on exam. Continue   4. Hyperlipidemia: LDL 38 October 2. Continue statin.   Current medicines are reviewed at length with the patient today.  The patient does not have concerns regarding medicines.  The following changes have been made:  no change  Labs/ tests ordered today include:  No orders of the defined types were placed in this encounter.  Disposition:   FU with me in July 2021.    Signed, Lauree Chandler, MD 02/13/2020 9:47 AM    [facility] [address], [place], [place]  [address] Phone: [phone]; Fax: [phone]

## 2020-02-16 ENCOUNTER — Ambulatory Visit (INDEPENDENT_AMBULATORY_CARE_PROVIDER_SITE_OTHER): Payer: Medicare HMO | Admitting: Physician Assistant

## 2020-02-16 ENCOUNTER — Encounter: Payer: Self-pay | Admitting: Physician Assistant

## 2020-02-16 VITALS — BP 92/62 | HR 61 | Ht 62.0 in | Wt 211.0 lb

## 2020-02-16 DIAGNOSIS — K219 Gastro-esophageal reflux disease without esophagitis: Secondary | ICD-10-CM | POA: Diagnosis not present

## 2020-02-16 DIAGNOSIS — R1011 Right upper quadrant pain: Secondary | ICD-10-CM | POA: Diagnosis not present

## 2020-02-16 MED ORDER — PANTOPRAZOLE SODIUM 40 MG PO TBEC
40.0000 mg | DELAYED_RELEASE_TABLET | Freq: Two times a day (BID) | ORAL | 2 refills | Status: DC
Start: 2020-02-16 — End: 2022-01-22

## 2020-02-16 NOTE — Patient Instructions (Signed)
If you are age 68 or older, your body mass index should be between 23-30. Your Body mass index is 38.59 kg/m. If this is out of the aforementioned range listed, please consider follow up with your Primary Care Provider.  If you are age 13 or younger, your body mass index should be between 19-25. Your Body mass index is 38.59 kg/m. If this is out of the aformentioned range listed, please consider follow up with your Primary Care Provider.   We have sent the following medications to your pharmacy for you to pick up at your convenience: Pantoprazole 40 mg 30-60 mins before meals   You have been scheduled for an abdominal ultrasound at [facility] (1st floor of hospital) on 02/28/20 at 10:30am. Please arrive 15 minutes prior to your appointment for registration. Make certain not to have anything to eat or drink 6 hours prior to your appointment. Should you need to reschedule your appointment, please contact radiology at [phone]. This test typically takes about 30 minutes to perform.  Thank you for choosing me and [facility].  Ellouise Newer

## 2020-02-16 NOTE — Progress Notes (Signed)
Chief Complaint: GERD  HPI:    Valerie Fields is a 69 year old female with a past medical history as listed below including chronic combined systolic and diastolic CHF (01/14/5101 echo with LVEF 55-60%), CAD (cardiac cath 10/13/2019 maintained on Brilinta), GERD, IBS and multiple others, known to Dr. Fuller Plan, who presents to clinic today with a complaint of reflux.    09/21/2015 colonoscopy with mild diverticulosis at the splenic flexure, transverse colon and hepatic flexure as well as ascending colon, moderate diverticulosis in the sigmoid and descending colon.  Otherwise normal.  Repeat recommended 10 years.      04/09/2018 patient seen in clinic by me for lower abdominal pain.  She was given Cipro and Flagyl empirically for diverticulitis and prescribed dicyclomine 10 mg every 6 hours as needed.    02/03/2019 right upper quadrant ultrasound with hepatic steatosis and otherwise normal.    01/30/2020 CMP normal, CBC normal.  Right upper quadrant ultrasound ordered.    Today, patient presents to clinic and explains that in July she had a heart attack and since then she has had issues with uncontrolled reflux.  Tells me that she was started on Brilinta which is a new medication for her and recently she was told to take this with food which she thinks helps her reflux symptoms.  Tells me though that typically she will eat breakfast in the morning and then exercise and when she exercises whatever she has eaten tends to come back up in her throat throughout the day.  Tells me that she was initially started on some pantoprazole 40 mg daily but she stopped this back in September.      Also describes some issues with right upper quadrant pain which seems to wrap around her side and up into her right shoulder blade.  Tells me she thinks that her gallbladder "spasms".  Tells me this is just a discomfort and not completely miserable "like some people describe".    Denies fever, chills, weight loss or symptoms that awaken  her from sleep.  Past Medical History:  Diagnosis Date  . Allergy   . Asthma   . Chronic combined systolic and diastolic CHF 58/52/7782   Ischemic CM // Echocardiogram 7/21: EF 40-45, Gr 2 DD, mid to apical ant/ant-sept AK, RVSP 23.4, trivial MR  . Coronary artery disease    NSTEMI 7/21: oLAD 30, pLAD 99 >> PCI: DES; D1 90 >> PCI: POBA, prox and mid RCA 15, EF 35  . Diverticulitis   . Gallbladder problem   . GERD (gastroesophageal reflux disease)   . History of heart attack   . Hyperlipidemia 10/15/2019   Zetia DC'd during admit for NSTEMI 7/21 >> ? to Atorvastatin  . IBS (irritable colon syndrome)   . Ischemic cardiomyopathy 10/15/2019  . Joint pain   . Lactose intolerance   . Multiple food allergies   . Non-ST elevation (NSTEMI) MI 10/13/2019   PCI:  DES to LAD and POBA to D1    Past Surgical History:  Procedure Laterality Date  . BREAST BIOPSY Left    No Scar seen   . BREAST CYST EXCISION Left    No scar seen   . CARDIAC CATHETERIZATION    . COLONOSCOPY    . CORONARY STENT INTERVENTION  10/13/2019  . CORONARY STENT INTERVENTION N/A 10/13/2019   Procedure: CORONARY STENT INTERVENTION;  Surgeon: Nelva Bush, MD;  Location: [facility];  Service: Cardiovascular;  Laterality: N/A;  mid lad  . LEFT HEART CATH  AND CORONARY ANGIOGRAPHY N/A 10/13/2019   Procedure: LEFT HEART CATH AND CORONARY ANGIOGRAPHY;  Surgeon: Nelva Bush, MD;  Location: [facility];  Service: Cardiovascular;  Laterality: N/A;  . TONSILLECTOMY      Current Outpatient Medications  Medication Sig Dispense Refill  . Acetaminophen (TYLENOL ARTHRITIS PAIN PO) Take by mouth as needed.    Marland Kitchen aspirin 81 MG chewable tablet Chew 1 tablet (81 mg total) by mouth daily.    Marland Kitchen atorvastatin (LIPITOR) 80 MG tablet Take 1 tablet (80 mg total) by mouth daily. 30 tablet 11  . azelastine (ASTELIN) 0.1 % nasal spray Place 1 spray into both nostrils daily. Use in each nostril as directed     .  Chlorpheniramine Maleate (ALLERGY RELIEF PO) Take by mouth daily.     Marland Kitchen dicyclomine (BENTYL) 10 MG capsule Take 1 tablet by mouth every 6 hours as needed. 30 capsule 0  . diphenhydrAMINE (SLEEP AID, DIPHENHYDRAMINE,) 25 MG tablet Take 25 mg by mouth at bedtime as needed for sleep.    . diphenhydrAMINE HCl (ALLERGY MED PO) Take by mouth as needed.     . docusate sodium (STOOL SOFTENER) 100 MG capsule Take 100 mg by mouth daily.     Marland Kitchen losartan (COZAAR) 25 MG tablet Take 1 tablet (25 mg total) by mouth daily. 30 tablet 11  . melatonin 3 MG TABS tablet Take 6 mg by mouth at bedtime.    . metoprolol succinate (TOPROL-XL) 25 MG 24 hr tablet Take 1 tablet (25 mg total) by mouth daily. 30 tablet 11  . montelukast (SINGULAIR) 10 MG tablet Take 10 mg by mouth at bedtime.   3  . nitroGLYCERIN (NITROSTAT) 0.4 MG SL tablet Place 1 tablet (0.4 mg total) under the tongue every 5 (five) minutes as needed for chest pain. 25 tablet 12  . pantoprazole (PROTONIX) 40 MG tablet Take 40 mg by mouth as needed.    . psyllium (REGULOID) 0.52 g capsule Take 1.5 g by mouth daily.    . ticagrelor (BRILINTA) 90 MG TABS tablet Take 1 tablet (90 mg total) by mouth 2 (two) times daily. 180 tablet 3  . Vitamin D, Ergocalciferol, (DRISDOL) 1.25 MG (50000 UNIT) CAPS capsule Take 1 capsule (50,000 Units total) by mouth every 7 (seven) days. 4 capsule 0   No current facility-administered medications for this visit.    Allergies as of 02/16/2020 - Review Complete 02/16/2020  Allergen Reaction Noted  . Soy allergy Hives 04/07/2016  . Sulfa antibiotics Other (See Comments) 09/03/2015    Family History  Problem Relation Age of Onset  . GER disease Father   . Aneurysm Mother   . Hyperlipidemia Mother   . Obesity Mother   . Stomach cancer Maternal Grandfather   . Colon cancer Neg Hx   . Esophageal cancer Neg Hx   . Pancreatic cancer Neg Hx   . Rectal cancer Neg Hx   . Breast cancer Neg Hx     Social History    Socioeconomic History  . Marital status: Divorced    Spouse name: Not on file  . Number of children: 2  . Years of education: 54  . Highest education level: Not on file  Occupational History  . Occupation: Retired  Tobacco Use  . Smoking status: Never Smoker  . Smokeless tobacco: Never Used  Vaping Use  . Vaping Use: Never used  Substance and Sexual Activity  . Alcohol use: Yes    Alcohol/week: 1.0 standard drink  Types: 1 Glasses of wine per week  . Drug use: No  . Sexual activity: Not Currently  Other Topics Concern  . Not on file  Social History Narrative  . Not on file   Social Determinants of Health   Financial Resource Strain:   . Difficulty of Paying Living Expenses: Not on file  Food Insecurity:   . Worried About Charity fundraiser in the Last Year: Not on file  . Ran Out of Food in the Last Year: Not on file  Transportation Needs:   . Lack of Transportation (Medical): Not on file  . Lack of Transportation (Non-Medical): Not on file  Physical Activity:   . Days of Exercise per Week: Not on file  . Minutes of Exercise per Session: Not on file  Stress:   . Feeling of Stress : Not on file  Social Connections:   . Frequency of Communication with Friends and Family: Not on file  . Frequency of Social Gatherings with Friends and Family: Not on file  . Attends Religious Services: Not on file  . Active Member of Clubs or Organizations: Not on file  . Attends Archivist Meetings: Not on file  . Marital Status: Not on file  Intimate Partner Violence:   . Fear of Current or Ex-Partner: Not on file  . Emotionally Abused: Not on file  . Physically Abused: Not on file  . Sexually Abused: Not on file    Review of Systems:    Constitutional: No weight loss, fever or chills Cardiovascular: No chest pain Respiratory: No SOB Gastrointestinal: See HPI and otherwise negative   Physical Exam:  Vital signs: BP 92/62 (BP Location: Left Arm, Patient  Position: Sitting, Cuff Size: Normal)   Pulse 61   Ht 5\' 2"  (1.575 m)   Wt 211 lb (95.7 kg)   BMI 38.59 kg/m   Constitutional:   Pleasant overweight Caucasian female appears to be in NAD, Well developed, Well nourished, alert and cooperative Respiratory: Respirations even and unlabored. Lungs clear to auscultation bilaterally.   No wheezes, crackles, or rhonchi.  Cardiovascular: Normal S1, S2. No MRG. Regular rate and rhythm. No peripheral edema, cyanosis or pallor.  Gastrointestinal:  Soft, nondistended, nontender. No rebound or guarding. Normal bowel sounds. No appreciable masses or hepatomegaly. Rectal:  Not performed.  Psychiatric:  Demonstrates good judgement and reason without abnormal affect or behaviors.  RELEVANT LABS AND IMAGING: CBC    Component Value Date/Time   WBC 8.2 01/30/2020 1050   WBC 15.2 (H) 10/14/2019 0850   RBC 4.86 01/30/2020 1050   RBC 4.98 10/14/2019 0850   HGB 15.5 01/30/2020 1050   HCT 46.6 01/30/2020 1050   PLT 328 01/30/2020 1050   MCV 96 01/30/2020 1050   MCH 31.9 01/30/2020 1050   MCH 31.5 10/14/2019 0850   MCHC 33.3 01/30/2020 1050   MCHC 34.0 10/14/2019 0850   RDW 12.5 01/30/2020 1050   LYMPHSABS 1.9 01/30/2020 1050   MONOABS 0.7 02/03/2019 1259   EOSABS 0.2 01/30/2020 1050   BASOSABS 0.0 01/30/2020 1050    CMP     Component Value Date/Time   NA 142 01/30/2020 1050   K 4.3 01/30/2020 1050   CL 107 (H) 01/30/2020 1050   CO2 21 01/30/2020 1050   GLUCOSE 107 (H) 01/30/2020 1050   GLUCOSE 118 (H) 10/14/2019 0253   BUN 11 01/30/2020 1050   CREATININE 0.81 01/30/2020 1050   CALCIUM 9.8 01/30/2020 1050   PROT 7.0 01/30/2020 1050  ALBUMIN 4.4 01/30/2020 1050   AST 18 01/30/2020 1050   ALT 27 01/30/2020 1050   ALKPHOS 95 01/30/2020 1050   BILITOT 0.5 01/30/2020 1050   GFRNONAA 75 01/30/2020 1050   GFRAA 86 01/30/2020 1050    Assessment: 1.  GERD: Increase in symptoms since being started on Brilinta back in July after heart attack;  consider most likely gastritis 2.  Right upper quadrant pain: Likely gallbladder from patient's description, will further evaluate  Plan: 1.  Reviewed patient's last ultrasound in 2020, this is prior to all of her current complaints.  Reordered a right upper quadrant ultrasound for further evaluation of pain.  If this is normal would recommend a HIDA scan with [facility]. 2.  Discussed with the patient that she needs to restart her pantoprazole 40 mg daily to help with reflux symptoms.  We can titrate this as needed.  Did discuss taking this 30 minutes before breakfast.  Refilled her prescription #30 with 3 refills. 3.  Patient to follow in clinic with me in 2 months or sooner if necessary.  Ellouise Newer, PA-C [facility] 02/16/2020, 11:31 AM  Cc: Marda Stalker, PA-C

## 2020-02-17 NOTE — Progress Notes (Signed)
Reviewed and agree with management plan.  Keyly Baldonado T. Ceferino Lang, MD FACG [facility]  

## 2020-02-20 NOTE — Progress Notes (Signed)
Chief Complaint:   OBESITY Valerie Fields is here to discuss her progress with her obesity treatment plan along with follow-up of her obesity related diagnoses. Valerie Fields is on the Category 2 Plan and states she is following her eating plan approximately 100% of the time. Valerie Fields states she is doing cardio and resistance for 60 minutes 3 times per week, walking 2 miles 2 times per week, and chair yoga for 2 minutes 2 times per week.  Today's visit was #: 2 Starting weight: 208 lbs Starting date: 01/31/2020 Today's weight: 208 lbs Today's date: 02/13/2020 Total lbs lost to date: 0 Total lbs lost since last in-office visit: 0  Interim History: Valerie Fields didn't like the meal plan secondary to the amount of sodium in the deli meat. She felt the variety was limited and she didn't know the quantity of certain things for substitution. She tried to move things around but she struggled with this. Breakfast is the easiest for her. She is planning to go on a cruise January 3rd-8th.  Subjective:   1. Pre-diabetes Valerie Fields has a new diagnosis of pre-diabetes. Her A1c is 5.9 and insulin 41.3. She is not on medications, and her previous A1c was 5.6, 5.5, and 5.4. I discussed labs with the patient today.  2. Vitamin D deficiency Valerie Fields denies nausea, vomiting, or muscle weakness, but she notes fatigue. She is on Vit D supplement. I discussed labs with the patient today.  3. Coronary artery disease, unspecified vessel or lesion type, unspecified whether angina present, unspecified whether native or transplanted heart Valerie Fields sees Dr. Angelena Form. She is on Lipitor, Brilinta, Cozaar, Toprol XL, and Nitrostat.  Assessment/Plan:   1. Pre-diabetes Valerie Fields will continue to work on weight loss, exercise, and decreasing simple carbohydrates to help decrease the risk of diabetes. We will repeat labs in 3 months.  2. Vitamin D deficiency Low Vitamin D level contributes to fatigue and are associated with obesity, breast, and colon cancer.  Valerie Fields agreed to start prescription Vitamin D 50,000 IU every week with no refills. She will follow-up for routine testing of Vitamin D, at least 2-3 times per year to avoid over-replacement.  - Vitamin D, Ergocalciferol, (DRISDOL) 1.25 MG (50000 UNIT) CAPS capsule; Take 1 capsule (50,000 Units total) by mouth every 7 (seven) days.  Dispense: 4 capsule; Refill: 0  3. Coronary artery disease, unspecified vessel or lesion type, unspecified whether angina present, unspecified whether native or transplanted heart Valerie Fields will follow up with Dr. Angelena Form in July 2022.  4. Class 2 severe obesity with serious comorbidity and body mass index (BMI) of 38.0 to 38.9 in adult, unspecified obesity type Valerie Fields) Valerie Fields is currently in the action stage of change. As such, her goal is to continue with weight loss efforts. She has agreed to keeping a food journal and adhering to recommended goals of 1150-1250 calories and 80+ grams of protein daily.   Exercise goals: As is.  Behavioral modification strategies: increasing lean protein intake, meal planning and cooking strategies, keeping healthy foods in the home and planning for success.  Valerie Fields has agreed to follow-up with our clinic in 2 to 3 weeks. She was informed of the importance of frequent follow-up visits to maximize her success with intensive lifestyle modifications for her multiple health conditions.   Objective:   Blood pressure 111/70, pulse 61, temperature 98 F (36.7 C), temperature source Oral, height 5\' 2"  (1.575 m), weight 208 lb (94.3 kg), SpO2 99 %. Body mass index is 38.04 kg/m.  General: Cooperative,  alert, well developed, in no acute distress. HEENT: Conjunctivae and lids unremarkable. Cardiovascular: Regular rhythm.  Lungs: Normal work of breathing. Neurologic: No focal deficits.   Lab Results  Component Value Date   CREATININE 0.81 01/30/2020   BUN 11 01/30/2020   NA 142 01/30/2020   K 4.3 01/30/2020   CL 107 (H) 01/30/2020   CO2 21  01/30/2020   Lab Results  Component Value Date   ALT 27 01/30/2020   AST 18 01/30/2020   ALKPHOS 95 01/30/2020   BILITOT 0.5 01/30/2020   Lab Results  Component Value Date   HGBA1C 5.9 (H) 01/30/2020   HGBA1C 5.6 10/13/2019   Lab Results  Component Value Date   INSULIN 41.3 (H) 01/30/2020   Lab Results  Component Value Date   TSH 0.988 01/30/2020   Lab Results  Component Value Date   CHOL 92 (L) 01/25/2020   HDL 36 (L) 01/25/2020   LDLCALC 38 01/25/2020   TRIG 89 01/25/2020   CHOLHDL 2.6 01/25/2020   Lab Results  Component Value Date   WBC 8.2 01/30/2020   HGB 15.5 01/30/2020   HCT 46.6 01/30/2020   MCV 96 01/30/2020   PLT 328 01/30/2020   No results found for: IRON, TIBC, FERRITIN  Obesity Behavioral Intervention:   Approximately 15 minutes were spent on the discussion below.  ASK: We discussed the diagnosis of obesity with Marijke today and Merrillyn agreed to give [place] permission to discuss obesity behavioral modification therapy today.  ASSESS: Jaionna has the diagnosis of obesity and her BMI today is 38.03. Azarria is in the action stage of change.   ADVISE: Allexa was educated on the multiple health risks of obesity as well as the benefit of weight loss to improve her health. She was advised of the need for long term treatment and the importance of lifestyle modifications to improve her current health and to decrease her risk of future health problems.  AGREE: Multiple dietary modification options and treatment options were discussed and Marenda agreed to follow the recommendations documented in the above note.  ARRANGE: Damara was educated on the importance of frequent visits to treat obesity as outlined per CMS and USPSTF guidelines and agreed to schedule her next follow up appointment today.  Attestation Statements:   Reviewed by clinician on day of visit: allergies, medications, problem list, medical history, surgical history, family history, social history, and  previous encounter notes.   I, Trixie Dredge, am acting as transcriptionist for Coralie Common, MD.  I have reviewed the above documentation for accuracy and completeness, and I agree with the above. - Jinny Blossom, MD

## 2020-02-28 ENCOUNTER — Ambulatory Visit (HOSPITAL_COMMUNITY)
Admission: RE | Admit: 2020-02-28 | Discharge: 2020-02-28 | Disposition: A | Discharge status: Home / Self Care | Payer: Medicare HMO | Source: Ambulatory Visit | Attending: Physician Assistant | Admitting: Physician Assistant

## 2020-02-28 ENCOUNTER — Other Ambulatory Visit: Payer: Self-pay

## 2020-02-28 DIAGNOSIS — R1011 Right upper quadrant pain: Secondary | ICD-10-CM | POA: Diagnosis present

## 2020-02-29 ENCOUNTER — Other Ambulatory Visit: Payer: Self-pay

## 2020-02-29 DIAGNOSIS — R1011 Right upper quadrant pain: Secondary | ICD-10-CM

## 2020-03-06 ENCOUNTER — Other Ambulatory Visit (INDEPENDENT_AMBULATORY_CARE_PROVIDER_SITE_OTHER): Payer: Self-pay | Admitting: Family Medicine

## 2020-03-06 DIAGNOSIS — E559 Vitamin D deficiency, unspecified: Secondary | ICD-10-CM

## 2020-03-06 NOTE — Telephone Encounter (Signed)
Dr Jearld Shines pt

## 2020-03-08 ENCOUNTER — Ambulatory Visit (INDEPENDENT_AMBULATORY_CARE_PROVIDER_SITE_OTHER): Payer: Medicare HMO | Admitting: Family Medicine

## 2020-03-22 ENCOUNTER — Other Ambulatory Visit: Payer: Self-pay

## 2020-03-22 ENCOUNTER — Ambulatory Visit (HOSPITAL_COMMUNITY)
Admission: RE | Admit: 2020-03-22 | Discharge: 2020-03-22 | Disposition: A | Discharge status: Home / Self Care | Payer: Medicare HMO | Source: Ambulatory Visit | Attending: Physician Assistant | Admitting: Physician Assistant

## 2020-03-22 DIAGNOSIS — R1011 Right upper quadrant pain: Secondary | ICD-10-CM | POA: Diagnosis present

## 2020-03-22 MED ORDER — TECHNETIUM TC 99M MEBROFENIN IV KIT
5.1000 | PACK | Freq: Once | INTRAVENOUS | Status: AC | PRN
  Administered 2020-03-22: 5.1 via INTRAVENOUS

## 2020-04-14 ENCOUNTER — Other Ambulatory Visit: Payer: Self-pay | Admitting: Family Medicine

## 2020-04-14 DIAGNOSIS — M8580 Other specified disorders of bone density and structure, unspecified site: Secondary | ICD-10-CM

## 2020-04-14 DIAGNOSIS — Z780 Asymptomatic menopausal state: Secondary | ICD-10-CM

## 2020-04-25 ENCOUNTER — Encounter: Payer: Self-pay | Admitting: Physician Assistant

## 2020-04-25 ENCOUNTER — Ambulatory Visit (INDEPENDENT_AMBULATORY_CARE_PROVIDER_SITE_OTHER): Payer: Medicare HMO | Admitting: Physician Assistant

## 2020-04-25 ENCOUNTER — Other Ambulatory Visit: Payer: Self-pay

## 2020-04-25 VITALS — BP 114/66 | HR 60 | Ht 62.0 in | Wt 218.8 lb

## 2020-04-25 DIAGNOSIS — K219 Gastro-esophageal reflux disease without esophagitis: Secondary | ICD-10-CM | POA: Diagnosis not present

## 2020-04-25 DIAGNOSIS — K828 Other specified diseases of gallbladder: Secondary | ICD-10-CM

## 2020-04-25 NOTE — Progress Notes (Signed)
Chief Complaint: Follow-up right upper quadrant abdominal pain and reflux  HPI:    Valerie Fields is a 70 year old female with a past medical history as listed below including chronic combined systolic and diastolic CHF (62/95/2841 echo with LVEF 55-60%), CAD (cardiac cath 10/13/2019 maintained on Brilinta), GERD, IBS and multiple others, known to Dr. Fuller Plan, who returns to clinic today for follow-up of right upper quadrant abdominal pain and reflux. 09/21/2015 colonoscopy with mild diverticulosis at the splenic flexure, transverse colon and hepatic flexure as well as ascending colon, moderate diverticulosis in the sigmoid and descending colon.  Otherwise normal.  Repeat recommended 10 years.      04/09/2018 patient seen in clinic by me for lower abdominal pain.  She was given Cipro and Flagyl empirically for diverticulitis and prescribed dicyclomine 10 mg every 6 hours as needed.     01/30/2020 CMP normal, CBC normal.  Right upper quadrant ultrasound ordered.    02/03/2019 right upper quadrant ultrasound with hepatic steatosis and otherwise normal.    02/16/2020 patient seen in clinic   and described uncontrolled reflux since her heart attack in July of that year also describes some issues with right upper quadrant abdominal pain which seem to wrap around under her side and up into her right shoulder blade described as "spasms".  At that time restarted pantoprazole 40 mg daily and ordered a repeat right upper quadrant ultrasound.    02/28/2020 right upper quadrant ultrasound with known hepatic steatosis and otherwise unremarkable.  HIDA scan with CCK was ordered.    03/22/2020 HIDA scan came back with patent biliary tree, slightly decreased gallbladder ejection fraction of 29% following fatty meal stimulation and pain following Ensure.    Today, the patient presents to clinic and tells me that her reflux is mostly under control, she has occasional breakthrough but it is "not even bad enough to take a  Rolaids".  Currently consistently taking Pantoprazole 40 mg once daily in the morning about 30 minutes before breakfast.    Continues with some "episodic" right upper quadrant burning sensation which will last for a day or 2 and is typically worse after fatty meals etc.    Denies fever, chills, weight loss or change in bowel habits.  Past Medical History:  Diagnosis Date  . Allergy   . Asthma   . Chronic combined systolic and diastolic CHF 32/44/0102   Ischemic CM // Echocardiogram 7/21: EF 40-45, Gr 2 DD, mid to apical ant/ant-sept AK, RVSP 23.4, trivial MR  . Coronary artery disease    NSTEMI 7/21: oLAD 30, pLAD 99 >> PCI: DES; D1 90 >> PCI: POBA, prox and mid RCA 15, EF 35  . Diverticulitis   . Gallbladder problem   . GERD (gastroesophageal reflux disease)   . History of heart attack   . Hyperlipidemia 10/15/2019   Zetia DC'd during admit for NSTEMI 7/21 >> ? to Atorvastatin  . IBS (irritable colon syndrome)   . Ischemic cardiomyopathy 10/15/2019  . Joint pain   . Lactose intolerance   . Multiple food allergies   . Non-ST elevation (NSTEMI) MI 10/13/2019   PCI:  DES to LAD and POBA to D1    Past Surgical History:  Procedure Laterality Date  . BREAST BIOPSY Left    No Scar seen   . BREAST CYST EXCISION Left    No scar seen   . CARDIAC CATHETERIZATION    . COLONOSCOPY    . CORONARY STENT INTERVENTION  10/13/2019  . CORONARY STENT  INTERVENTION N/A 10/13/2019   Procedure: CORONARY STENT INTERVENTION;  Surgeon: Yvonne Kendall, MD;  Location: [facility];  Service: Cardiovascular;  Laterality: N/A;  mid lad  . LEFT HEART CATH AND CORONARY ANGIOGRAPHY N/A 10/13/2019   Procedure: LEFT HEART CATH AND CORONARY ANGIOGRAPHY;  Surgeon: Yvonne Kendall, MD;  Location: [facility];  Service: Cardiovascular;  Laterality: N/A;  . TONSILLECTOMY      Current Outpatient Medications  Medication Sig Dispense Refill  . Acetaminophen (TYLENOL ARTHRITIS PAIN PO) Take by mouth as  needed.    Marland Kitchen aspirin 81 MG chewable tablet Chew 1 tablet (81 mg total) by mouth daily.    Marland Kitchen atorvastatin (LIPITOR) 80 MG tablet Take 1 tablet (80 mg total) by mouth daily. 30 tablet 11  . azelastine (ASTELIN) 0.1 % nasal spray Place 1 spray into both nostrils daily. Use in each nostril as directed     . Chlorpheniramine Maleate (ALLERGY RELIEF PO) Take by mouth daily.     Marland Kitchen dicyclomine (BENTYL) 10 MG capsule Take 1 tablet by mouth every 6 hours as needed. 30 capsule 0  . diphenhydrAMINE (SLEEP AID, DIPHENHYDRAMINE,) 25 MG tablet Take 25 mg by mouth at bedtime as needed for sleep.    . diphenhydrAMINE HCl (ALLERGY MED PO) Take by mouth as needed.     . docusate sodium (STOOL SOFTENER) 100 MG capsule Take 100 mg by mouth daily.     Marland Kitchen losartan (COZAAR) 25 MG tablet Take 1 tablet (25 mg total) by mouth daily. 30 tablet 11  . melatonin 3 MG TABS tablet Take 6 mg by mouth at bedtime.    . metoprolol succinate (TOPROL-XL) 25 MG 24 hr tablet Take 1 tablet (25 mg total) by mouth daily. 30 tablet 11  . montelukast (SINGULAIR) 10 MG tablet Take 10 mg by mouth at bedtime.   3  . nitroGLYCERIN (NITROSTAT) 0.4 MG SL tablet Place 1 tablet (0.4 mg total) under the tongue every 5 (five) minutes as needed for chest pain. 25 tablet 12  . pantoprazole (PROTONIX) 40 MG tablet Take 1 tablet (40 mg total) by mouth 2 (two) times daily before a meal. 30 tablet 2  . psyllium (REGULOID) 0.52 g capsule Take 1.5 g by mouth daily.    . ticagrelor (BRILINTA) 90 MG TABS tablet Take 1 tablet (90 mg total) by mouth 2 (two) times daily. 180 tablet 3  . Vitamin D, Ergocalciferol, (DRISDOL) 1.25 MG (50000 UNIT) CAPS capsule Take 1 capsule (50,000 Units total) by mouth every 7 (seven) days. 4 capsule 0   No current facility-administered medications for this visit.    Allergies as of 04/25/2020 - Review Complete 02/16/2020  Allergen Reaction Noted  . Soy allergy Hives 04/07/2016  . Sulfa antibiotics Other (See Comments)  09/03/2015    Family History  Problem Relation Age of Onset  . GER disease Father   . Aneurysm Mother   . Hyperlipidemia Mother   . Obesity Mother   . Stomach cancer Maternal Grandfather   . Colon cancer Neg Hx   . Esophageal cancer Neg Hx   . Pancreatic cancer Neg Hx   . Rectal cancer Neg Hx   . Breast cancer Neg Hx     Social History   Socioeconomic History  . Marital status: Divorced    Spouse name: Not on file  . Number of children: 2  . Years of education: 66  . Highest education level: Not on file  Occupational History  . Occupation:  Retired  Tobacco Use  . Smoking status: Never Smoker  . Smokeless tobacco: Never Used  Vaping Use  . Vaping Use: Never used  Substance and Sexual Activity  . Alcohol use: Yes    Alcohol/week: 1.0 standard drink    Types: 1 Glasses of wine per week  . Drug use: No  . Sexual activity: Not Currently  Other Topics Concern  . Not on file  Social History Narrative  . Not on file   Social Determinants of Health   Financial Resource Strain: Not on file  Food Insecurity: Not on file  Transportation Needs: Not on file  Physical Activity: Not on file  Stress: Not on file  Social Connections: Not on file  Intimate Partner Violence: Not on file    Review of Systems:    Constitutional: No weight loss, fever or chills Cardiovascular: No chest pain Respiratory: No SOB  Gastrointestinal: See HPI and otherwise negative   Physical Exam:  Vital signs: BP 114/66   Pulse 60   Ht 5\' 2"  (1.575 m)   Wt 218 lb 12.8 oz (99.2 kg)   BMI 40.02 kg/m   Constitutional:   Pleasant overweight Caucasian female appears to be in NAD, Well developed, Well nourished, alert and cooperative Respiratory: Respirations even and unlabored. Lungs clear to auscultation bilaterally.   No wheezes, crackles, or rhonchi.  Cardiovascular: Normal S1, S2. No MRG. Regular rate and rhythm. No peripheral edema, cyanosis or pallor.  Gastrointestinal:  Soft,  nondistended, nontender. No rebound or guarding. Normal bowel sounds. No appreciable masses or hepatomegaly. Rectal:  Not performed.  Psychiatric:  Demonstrates good judgement and reason without abnormal affect or behaviors.  See HPI for recent labs/imaging.  Assessment: 1.  GERD: Symptoms controlled on Pantoprazole 40 mg daily 2.  Chronic episodic right upper quadrant abdominal pain: Recent HIDA scan with biliary dyskinesia which is likely the cause  Plan: 1.  Continue Pantoprazole 40 mg daily.  Discussed with patient that if she has occasional breakthrough symptoms she can use Pepcid 20 mg over-the-counter. 2.  Referred the patient to [facility] for further discussion about cholecystectomy given biliary dyskinesia. 3.  Patient to follow in clinic with [place] as needed in the future.   Ellouise Newer, PA-C [facility] 04/25/2020, 1:22 PM  Cc: Marda Stalker, PA-C

## 2020-04-25 NOTE — Progress Notes (Signed)
Reviewed and agree with management plan.  Pete Merten T. Kristianne Albin, MD FACG [phone]  

## 2020-04-25 NOTE — Patient Instructions (Addendum)
If you are age 69 or older, your body mass index should be between 23-30. Your Body mass index is 40.02 kg/m. If this is out of the aforementioned range listed, please consider follow up with your Primary Care Provider.  If you are age 43 or younger, your body mass index should be between 19-25. Your Body mass index is 40.02 kg/m. If this is out of the aformentioned range listed, please consider follow up with your Primary Care Provider.   Continue Pantoprazole 40 mg 1 tablet daily. Contact your pharmacy if you need refills.  Try Pepcid 20 mg over the counter for any breakthrough Reflux.  We are referring you to [facility]. They should contact you within the next 2 weeks.  Follow up as needed.   Thank you for entrusting me with your care and choosing [facility].  Ellouise Newer, PA-C

## 2020-04-27 ENCOUNTER — Other Ambulatory Visit: Payer: Self-pay | Admitting: Physician Assistant

## 2020-05-15 ENCOUNTER — Encounter (HOSPITAL_BASED_OUTPATIENT_CLINIC_OR_DEPARTMENT_OTHER): Payer: Self-pay

## 2020-05-15 DIAGNOSIS — G4733 Obstructive sleep apnea (adult) (pediatric): Secondary | ICD-10-CM

## 2020-05-28 ENCOUNTER — Other Ambulatory Visit: Payer: Self-pay | Admitting: Family Medicine

## 2020-05-28 DIAGNOSIS — Z1231 Encounter for screening mammogram for malignant neoplasm of breast: Secondary | ICD-10-CM

## 2020-06-27 ENCOUNTER — Other Ambulatory Visit: Payer: Self-pay

## 2020-06-27 ENCOUNTER — Ambulatory Visit (HOSPITAL_BASED_OUTPATIENT_CLINIC_OR_DEPARTMENT_OTHER): Payer: Medicare HMO | Attending: Internal Medicine | Admitting: Internal Medicine

## 2020-06-27 DIAGNOSIS — G4731 Primary central sleep apnea: Secondary | ICD-10-CM | POA: Insufficient documentation

## 2020-06-27 DIAGNOSIS — G4733 Obstructive sleep apnea (adult) (pediatric): Secondary | ICD-10-CM | POA: Diagnosis present

## 2020-07-02 NOTE — Procedures (Signed)
   NAME: Valerie Fields DATE OF BIRTH:  [birth year] MEDICAL RECORD NUMBER [id number]  LOCATION: [facility]  PHYSICIAN: Marius Ditch  DATE OF STUDY: 06/27/2020  SLEEP STUDY TYPE: Positive Airway Pressure Titration               REFERRING PHYSICIAN: Marius Ditch, MD/Jen Percell Miller, PA-C  EPWORTH SLEEPINESS SCORE:  NA HEIGHT: 5\' 2"  (157.5 cm)  WEIGHT: 208 lb (94.3 kg)    Body mass index is 38.04 kg/m.  NECK SIZE: 14 in.  CLINICAL INFORMATION The patient was referred to the sleep center for evaluation of BPAP/ASV titration. She has predominantly OSA with emergence of CSA with auto adjusting CPAP. Test is done to establish best PAP therapy for patient.  MEDICATIONS Patient self administered medications include: MELATONIN.   SLEEP STUDY TECHNIQUE The patient underwent an attended overnight polysomnography titration to assess the effects of cpap therapy. The following variables were monitored: EEG (C4-A1, C3-A2, O1-A2, O2-A1, F3-M2, F4-M1), EOG, submental and leg EMG, ECG, oxyhemoglobin saturation by pulse oximetry, thoracic and abdominal respiratory effort belts, nasal/oral airflow by pressure sensor, body position sensor and snoring sensor. CPAP pressure was titrated to eliminate apneas, hypopneas and oxygen desaturation.  TECHNICAL COMMENTS Comments added by Technician: FREQUENT AROUSALS NOTED. Comments added by Scorer: N/A   SLEEP ARCHITECTURE The study was initiated at 10:40:32 PM and terminated at 5:19:14 AM. Total recorded time was 398.7 minutes. EEG confirmed total sleep time was 202.5 minutes yielding a sleep efficiency of 50.8%%. Sleep onset after lights out was 24.8 minutes with a REM latency of 341.0 minutes. The patient spent 61.2%% of the night in stage N1 sleep, 37.8%% in stage N2 sleep, 0.0%% in stage N3 and 1% in REM. The Arousal Index was 93.9/hour.  RESPIRATORY PARAMETERS The patient was started at a CPAP of 10 cm H2O as ordered. When  central apnea developed, the patient was changed to BIPAP ST. Escalating pressures were used, and there was improvement in central apneas, however, a large number of RERAs occurred, also likely of central origin based on (1) increasing severity with increasing pressure and (2) lack of flattening on waveform of PSG. At the highest setting, the sleep efficiency was 38 % and the patient was supine for 13%. The AHI was 10.2 events per hour, and the RDI was 67.3 events/hour and the arousal index was 32.6 per hour.The oxygen nadir was 93.0% during sleep.  LEG MOVEMENT DATA The total leg movements were 0 with a resulting leg movement index of 0.0. Associated arousal with leg movement index was 0.0.  CARDIAC DATA The underlying cardiac rhythm was most consistent with sinus rhythm. Mean heart rate during sleep was 51.4 bpm. Additional rhythm abnormalities include PVCs.  IMPRESSIONS - Obstructive Sleep apnea (OSA) with emergence of Central Sleep Apnea on CPAP. - Optimal mode and pressure were not found. - No significant periodic leg movements(PLMs) during sleep. However, no significant associated arousals. [name] reduced sleep efficiency  DIAGNOSIS - Obstructive Sleep Apnea (G47.33) - Central Sleep Apnea  RECOMMENDATIONS - A suitable mode and pressure were not found for the patient. AutoASV titration is recommended to try to solve the patients obstructive and central events.   Marius Ditch Sleep specialist, [place] Board of [employer] Medicine  ELECTRONICALLY SIGNED ON:  07/02/2020, 8:36 PM [place] PH: [phone]   FX: [phone] [place]

## 2020-07-04 ENCOUNTER — Other Ambulatory Visit: Payer: Self-pay

## 2020-07-04 ENCOUNTER — Ambulatory Visit
Admission: RE | Admit: 2020-07-04 | Discharge: 2020-07-04 | Disposition: A | Discharge status: Home / Self Care | Payer: Medicare HMO | Source: Ambulatory Visit

## 2020-07-04 DIAGNOSIS — Z1231 Encounter for screening mammogram for malignant neoplasm of breast: Secondary | ICD-10-CM

## 2020-07-21 ENCOUNTER — Other Ambulatory Visit: Payer: Self-pay | Admitting: Physician Assistant

## 2020-07-23 ENCOUNTER — Other Ambulatory Visit (HOSPITAL_BASED_OUTPATIENT_CLINIC_OR_DEPARTMENT_OTHER): Payer: Self-pay

## 2020-07-23 DIAGNOSIS — G4731 Primary central sleep apnea: Secondary | ICD-10-CM

## 2020-07-31 ENCOUNTER — Encounter: Payer: Medicare HMO | Admitting: Internal Medicine

## 2020-08-01 ENCOUNTER — Other Ambulatory Visit: Payer: Self-pay

## 2020-08-01 ENCOUNTER — Ambulatory Visit (HOSPITAL_BASED_OUTPATIENT_CLINIC_OR_DEPARTMENT_OTHER): Payer: Medicare HMO | Attending: Internal Medicine | Admitting: Internal Medicine

## 2020-08-01 DIAGNOSIS — G4733 Obstructive sleep apnea (adult) (pediatric): Secondary | ICD-10-CM | POA: Diagnosis not present

## 2020-08-01 DIAGNOSIS — G4731 Primary central sleep apnea: Secondary | ICD-10-CM

## 2020-08-13 ENCOUNTER — Other Ambulatory Visit: Payer: Self-pay

## 2020-08-13 ENCOUNTER — Telehealth: Payer: Self-pay | Admitting: *Deleted

## 2020-08-13 ENCOUNTER — Ambulatory Visit
Admission: RE | Admit: 2020-08-13 | Discharge: 2020-08-13 | Disposition: A | Discharge status: Home / Self Care | Payer: Medicare HMO | Source: Ambulatory Visit | Attending: Family Medicine | Admitting: Family Medicine

## 2020-08-13 DIAGNOSIS — M8580 Other specified disorders of bone density and structure, unspecified site: Secondary | ICD-10-CM

## 2020-08-13 DIAGNOSIS — Z780 Asymptomatic menopausal state: Secondary | ICD-10-CM

## 2020-08-13 MED ORDER — CLOPIDOGREL BISULFATE 75 MG PO TABS: 75.0000 mg | ORAL_TABLET | Freq: Every day | ORAL | 3 refills | Status: DC

## 2020-08-13 NOTE — Telephone Encounter (Signed)
Dr. Angelena Form- should we load with 300mg  of Plavix or forego because of the bruising?

## 2020-08-13 NOTE — Telephone Encounter (Signed)
Pt is returning call to nurse from earlier today. Please advise

## 2020-08-13 NOTE — Telephone Encounter (Signed)
Left message for pt to call back. See My Chart encounter 08/10/20 for further details.   Burnell Blanks, MD  You 3 hours ago (7:28 AM)    We can change her Brilinta to Plavix 75 mg daily. Chris     _______________________________________________________________________

## 2020-08-13 NOTE — Telephone Encounter (Signed)
Spoke with patient about discontinuing the Brilinta and starting plavix 75 mg daily and patient will do this.  Prescription sent to her requested pharmacy.

## 2020-10-08 NOTE — Progress Notes (Signed)
Cardiology Office Note    Date:  10/10/2020   ID:  [name], [name] [birth year], MRN [id number]   PCP:  Wharton, [place], [place]  Cardiologist:  Lauree Chandler, MD   Advanced Practice Provider:  No care team member to display Electrophysiologist:  None   [id number]   Chief Complaint  Patient presents with   Follow-up     History of Present Illness:  Valerie Fields is a 70 y.o. female with history of GERD and asthma who presented with stuttering chest pain for 2 days/NSTEMI 10/13/19 and underwent DES to the high-grade mid LAD involving diagonal 1 treated with POBA.  EF 35% at cath, 40 to 45% with moderate diastolic dysfunction on echo.  She received 2 doses of IV Lasix post cath.  Discharged on aspirin, Brilinta, atorvastatin, losartan, metoprolol.  Spironolactone was not started due to low BP can be considered as outpatient.  Follow-up echo 01/25/2020 LVEF 55 to 60% with no significant valve disease.  Also on CPAP for OSA.  Patient last saw Dr. Angelena Form 02/13/2020 and was doing well.  She had extensive bruising while on Brilinta and was changed to Plavix 07/2020.  Patient comes in for f/u. Bruising improved on Plavix. Denies chest pain, dyspnea. Does 5 hours of exercise weekly-O2 and zoom videos. Has lost 2-3 lbs recently. Went on a cruise to [place] in May.        Past Medical History:  Diagnosis Date   Allergy    Asthma    Chronic combined systolic and diastolic CHF 53/97/6734   Ischemic CM // Echocardiogram 7/21: EF 40-45, Gr 2 DD, mid to apical ant/ant-sept AK, RVSP 23.4, trivial MR   Coronary artery disease    NSTEMI 7/21: oLAD 30, pLAD 99 >> PCI: DES; D1 90 >> PCI: POBA, prox and mid RCA 15, EF 35   Diverticulitis    Gallbladder problem    GERD (gastroesophageal reflux disease)    History of heart attack    Hyperlipidemia 10/15/2019   Zetia DC'd during admit for NSTEMI 7/21 >> ? to Atorvastatin   IBS (irritable  colon syndrome)    Ischemic cardiomyopathy 10/15/2019   Joint pain    Lactose intolerance    Multiple food allergies    Non-ST elevation (NSTEMI) MI 10/13/2019   PCI:  DES to LAD and POBA to D1    Past Surgical History:  Procedure Laterality Date   BREAST BIOPSY Left    No Scar seen    BREAST CYST EXCISION Left    No scar seen    CARDIAC CATHETERIZATION     COLONOSCOPY     CORONARY STENT INTERVENTION  10/13/2019   CORONARY STENT INTERVENTION N/A 10/13/2019   Procedure: CORONARY STENT INTERVENTION;  Surgeon: Nelva Bush, MD;  Location: [facility];  Service: Cardiovascular;  Laterality: N/A;  mid lad   LEFT HEART CATH AND CORONARY ANGIOGRAPHY N/A 10/13/2019   Procedure: LEFT HEART CATH AND CORONARY ANGIOGRAPHY;  Surgeon: Nelva Bush, MD;  Location: [facility];  Service: Cardiovascular;  Laterality: N/A;   TONSILLECTOMY      Current Medications: Current Meds  Medication Sig   Acetaminophen (TYLENOL ARTHRITIS PAIN PO) Take by mouth as needed.   atorvastatin (LIPITOR) 80 MG tablet TAKE ONE TABLET BY MOUTH EVERYDAY AT BEDTIME   azelastine (ASTELIN) 0.1 % nasal spray Place 1 spray into both nostrils daily. Use in each nostril as directed   Chlorpheniramine Maleate (ALLERGY  RELIEF PO) Take by mouth daily.    clopidogrel (PLAVIX) 75 MG tablet Take 1 tablet (75 mg total) by mouth daily.   dicyclomine (BENTYL) 10 MG capsule Take 1 tablet by mouth every 6 hours as needed.   diphenhydrAMINE HCl (ALLERGY MED PO) Take by mouth as needed.    docusate sodium (COLACE) 100 MG capsule Take 100 mg by mouth daily.    losartan (COZAAR) 25 MG tablet TAKE ONE TABLET BY MOUTH EVERY MORNING   melatonin 3 MG TABS tablet Take 6 mg by mouth at bedtime.   metoprolol succinate (TOPROL-XL) 25 MG 24 hr tablet TAKE ONE TABLET BY MOUTH EVERY MORNING   metroNIDAZOLE (METROGEL) 0.75 % gel SMARTSIG:Sparingly Topical 1 to 2 Times Daily   montelukast (SINGULAIR) 10 MG tablet Take 10 mg by mouth  at bedtime.    nitroGLYCERIN (NITROSTAT) 0.4 MG SL tablet Place 1 tablet (0.4 mg total) under the tongue every 5 (five) minutes as needed for chest pain.   pantoprazole (PROTONIX) 40 MG tablet Take 1 tablet (40 mg total) by mouth 2 (two) times daily before a meal.   psyllium (REGULOID) 0.52 g capsule Take 1.5 g by mouth daily.     Allergies:   Soy allergy and Sulfa antibiotics   Social History   Socioeconomic History   Marital status: Divorced    Spouse name: Not on file   Number of children: 2   Years of education: 16   Highest education level: Not on file  Occupational History   Occupation: Retired  Tobacco Use   Smoking status: Never   Smokeless tobacco: Never  Vaping Use   Vaping Use: Never used  Substance and Sexual Activity   Alcohol use: Yes    Alcohol/week: 1.0 standard drink    Types: 1 Glasses of wine per week   Drug use: No   Sexual activity: Not Currently  Other Topics Concern   Not on file  Social History Narrative   Not on file   Social Determinants of Health   Financial Resource Strain: Not on file  Food Insecurity: Not on file  Transportation Needs: Not on file  Physical Activity: Not on file  Stress: Not on file  Social Connections: Not on file     Family History:  The patient's  family history includes Aneurysm in her mother; GER disease in her father; Hyperlipidemia in her mother; Obesity in her mother; Stomach cancer in her maternal grandfather.   ROS:   Please see the history of present illness.    ROS All other systems reviewed and are negative.   PHYSICAL EXAM:   VS:  BP 122/62   Pulse 69   Ht 5\' 2"  (1.575 m)   Wt 208 lb 12.8 oz (94.7 kg)   SpO2 97%   BMI 38.19 kg/m   Physical Exam  GEN: Obesity, in no acute distress  Neck: no JVD, carotid bruits, or masses Cardiac:RRR; no murmurs, rubs, or gallops  Respiratory:  clear to auscultation bilaterally, normal work of breathing GI: soft, nontender, nondistended, + BS Ext: without  cyanosis, clubbing, or edema, Good distal pulses bilaterally Neuro:  Alert and Oriented x 3, Psych: euthymic mood, full affect  Wt Readings from Last 3 Encounters:  10/10/20 208 lb 12.8 oz (94.7 kg)  08/01/20 208 lb (94.3 kg)  06/27/20 208 lb (94.3 kg)      Studies/Labs Reviewed:   EKG:  EKG is not ordered today.  Recent Labs: 01/30/2020: ALT 27; BUN 11; Creatinine,  Ser 0.81; Hemoglobin 15.5; Platelets 328; Potassium 4.3; Sodium 142; TSH 0.988   Lipid Panel    Component Value Date/Time   CHOL 92 (L) 01/25/2020 0833   TRIG 89 01/25/2020 0833   HDL 36 (L) 01/25/2020 0833   CHOLHDL 2.6 01/25/2020 0833   CHOLHDL 3.7 10/13/2019 1502   VLDL 32 10/13/2019 1502   LDLCALC 38 01/25/2020 0833    Additional studies/ records that were reviewed today include:   Echo 01/25/20:  1. Left ventricular ejection fraction, by estimation, is 55 to 60%. The  left ventricle has normal function. The left ventricle has no regional  wall motion abnormalities. Left ventricular diastolic parameters are  consistent with Grade II diastolic  dysfunction (pseudonormalization).   2. Right ventricular systolic function is normal. The right ventricular  size is normal. There is normal pulmonary artery systolic pressure.   3. Left atrial size was mildly dilated.   4. The mitral valve is abnormal. Trivial mitral valve regurgitation. No  evidence of mitral stenosis. Moderate mitral annular calcification.   5. The aortic valve is normal in structure. Aortic valve regurgitation is  not visualized. No aortic stenosis is present.   6. The inferior vena cava is normal in size with greater than 50%  respiratory variability, suggesting right atrial pressure of 3 mmHg.  ECHOCARDIOGRAM COMPLETE  10/13/2019 IMPRESSIONS   1. Left ventricular ejection fraction, by estimation, is 40 to 45%. The left ventricle has mildly decreased function. The left ventricle demonstrates regional wall motion abnormalities (see scoring  diagram/findings for description). Mid to apical anterior/anteroseptal akinesis. Left ventricular diastolic parameters are consistent with Grade II diastolic dysfunction (pseudonormalization). Elevated left atrial pressure.   2. Right ventricular systolic function is normal. The right ventricular size is normal. There is normal pulmonary artery systolic pressure. The estimated right ventricular systolic pressure is 36.6 mmHg.   3. The mitral valve is normal in structure. Trivial mitral valve regurgitation.   4. The aortic valve is tricuspid. Aortic valve regurgitation is not visualized. No aortic stenosis is present.   5. The inferior vena cava is normal in size with greater than 50% respiratory variability, suggesting right atrial pressure of 3 mmHg.    CARDIAC CATHETERIZATION 10/13/19 LM absent LAD ost 30, prox 99 thrombotic; D1 90 thrombotic LCx normal RCA prox 15, mid 15 EF ~35 PCI: STENT RESOLUTE ONYX DES 2.75X22 to pLAD PCI: POBA to D1  Conclusions: Severe single-vessel CAD with 99% stenosis of mid LAD involving D1 with TIMI-1 flow. Moderately to severely reduced left ventricular systolic function with mid and apical anterior hypokinesis/akinesis.  LVEF ~35%. Mildly elevated left ventricular filling pressure (LVEDP 20-25 mmHg). Successful PCI to mid LAD/D1 using Resolute Onyx 2.75 x 22 mm drug-eluting stent and kissing balloon inflation in LAD/D1 with 0% residual stenosis and TIMI-3 flow.   Risk Assessment/Calculations:         ASSESSMENT:    1. Coronary artery disease involving native coronary artery of native heart without angina pectoris   2. Ischemic cardiomyopathy   3. Chronic diastolic CHF (congestive heart failure) ([place])   4. Mixed hyperlipidemia   5. Class 2 severe obesity due to excess calories with serious comorbidity and body mass index (BMI) of 39.0 to 39.9 in adult ([place])   6. Pre-diabetes      PLAN:  In order of problems listed above:  CAD status post NSTEMI  treated with DES to the mid LAD and POBA to the D1 was on Brilinta, ASA but extensive bruising so  changed to Plavix which has helped, and lipitor.  No angina. Check surveillance labs   Ischemic cardiomyopathy ejection fraction 35% at cath 40 to 45% with improvement on echo 12/2019 LVEF 55 to 60% with grade 2 DD -compensated  chronic diastolic CHF-no CHF on exam, weighing herself daily.  2 g sodium diet no edema   Hyperlipidemia started on atorvastatin 80 mg once daily.  check labs today   Obesity exercise and weight loss essential to overall health. Working hard on it now. Was not satisfied with Weight loss center because of high sodium recommendations.  Pre-diabetes with A1C 5.9 in Nov. Repeat today.    Shared Decision Making/Informed Consent        Medication Adjustments/Labs and Tests Ordered: Current medicines are reviewed at length with the patient today.  Concerns regarding medicines are outlined above.  Medication changes, Labs and Tests ordered today are listed in the Patient Instructions below. Patient Instructions  Medication Instructions:  Your physician recommends that you continue on your current medications as directed. Please refer to the Current Medication list given to you today.  *If you need a refill on your cardiac medications before your next appointment, please call your pharmacy*   Lab Work: TODAY: CMET, CBC, FLP, A1C  If you have labs (blood work) drawn today and your tests are completely normal, you will receive your results only by: [place] (if you have MyChart) OR A paper copy in the mail If you have any lab test that is abnormal or we need to change your treatment, we will call you to review the results.  Follow-Up: At [facility], you and your health needs are our priority.  As part of our continuing mission to provide you with exceptional heart care, we have created designated Provider Care Teams.  These Care Teams include your primary  Cardiologist (physician) and Advanced Practice Providers (APPs -  Physician Assistants and Nurse Practitioners) who all work together to provide you with the care you need, when you need it.   Your next appointment:   6 month(s)  The format for your next appointment:   In Person  Provider:   You may see Lauree Chandler, MD or one of the following Advanced Practice Providers on your designated Care Team:   Melina Copa, PA-C Ermalinda Barrios, PA-C      Signed, Ermalinda Barrios, PA-C  10/10/2020 9:46 AM    [address], [place], [place]  [address] Phone: [phone]; Fax: [phone]

## 2020-10-10 ENCOUNTER — Encounter: Payer: Self-pay | Admitting: Physician Assistant

## 2020-10-10 ENCOUNTER — Ambulatory Visit (INDEPENDENT_AMBULATORY_CARE_PROVIDER_SITE_OTHER): Payer: Medicare HMO | Admitting: Physician Assistant

## 2020-10-10 ENCOUNTER — Other Ambulatory Visit: Payer: Self-pay

## 2020-10-10 VITALS — BP 122/62 | HR 69 | Ht 62.0 in | Wt 208.8 lb

## 2020-10-10 DIAGNOSIS — Z6839 Body mass index (BMI) 39.0-39.9, adult: Secondary | ICD-10-CM

## 2020-10-10 DIAGNOSIS — I5032 Chronic diastolic (congestive) heart failure: Secondary | ICD-10-CM

## 2020-10-10 DIAGNOSIS — I255 Ischemic cardiomyopathy: Secondary | ICD-10-CM

## 2020-10-10 DIAGNOSIS — R7303 Prediabetes: Secondary | ICD-10-CM

## 2020-10-10 DIAGNOSIS — I2510 Atherosclerotic heart disease of native coronary artery without angina pectoris: Secondary | ICD-10-CM | POA: Diagnosis not present

## 2020-10-10 DIAGNOSIS — E782 Mixed hyperlipidemia: Secondary | ICD-10-CM

## 2020-10-10 NOTE — Patient Instructions (Signed)
Medication Instructions:  Your physician recommends that you continue on your current medications as directed. Please refer to the Current Medication list given to you today.  *If you need a refill on your cardiac medications before your next appointment, please call your pharmacy*   Lab Work: TODAY: CMET, CBC, FLP, A1C  If you have labs (blood work) drawn today and your tests are completely normal, you will receive your results only by: [place] (if you have MyChart) OR A paper copy in the mail If you have any lab test that is abnormal or we need to change your treatment, we will call you to review the results.  Follow-Up: At [facility], you and your health needs are our priority.  As part of our continuing mission to provide you with exceptional heart care, we have created designated Provider Care Teams.  These Care Teams include your primary Cardiologist (physician) and Advanced Practice Providers (APPs -  Physician Assistants and Nurse Practitioners) who all work together to provide you with the care you need, when you need it.   Your next appointment:   6 month(s)  The format for your next appointment:   In Person  Provider:   You may see Lauree Chandler, MD or one of the following Advanced Practice Providers on your designated Care Team:   Melina Copa, PA-C Ermalinda Barrios, PA-C

## 2020-10-11 LAB — LIPID PANEL
Chol/HDL Ratio: 2.5 ratio (ref 0.0–4.4)
Cholesterol, Total: 111 mg/dL (ref 100–199)
HDL: 45 mg/dL (ref >39)
LDL Chol Calc (NIH): 43 mg/dL (ref 0–99)
Triglycerides: 135 mg/dL (ref 0–149)
VLDL Cholesterol Cal: 23 mg/dL (ref 5–40)

## 2020-10-11 LAB — COMPREHENSIVE METABOLIC PANEL
ALT: 39 IU/L — ABNORMAL HIGH (ref 0–32)
AST: 29 IU/L (ref 0–40)
Albumin/Globulin Ratio: 1.8 (ref 1.2–2.2)
Albumin: 4.4 g/dL (ref 3.8–4.8)
Alkaline Phosphatase: 83 IU/L (ref 44–121)
BUN/Creatinine Ratio: 15 (ref 12–28)
BUN: 13 mg/dL (ref 8–27)
Bilirubin Total: 0.6 mg/dL (ref 0.0–1.2)
CO2: 21 mmol/L (ref 20–29)
Calcium: 9.5 mg/dL (ref 8.7–10.3)
Chloride: 105 mmol/L (ref 96–106)
Creatinine, Ser: 0.88 mg/dL (ref 0.57–1.00)
Globulin, Total: 2.4 g/dL (ref 1.5–4.5)
Glucose: 93 mg/dL (ref 65–99)
Potassium: 4.4 mmol/L (ref 3.5–5.2)
Sodium: 141 mmol/L (ref 134–144)
Total Protein: 6.8 g/dL (ref 6.0–8.5)
eGFR: 72 mL/min/{1.73_m2} (ref >59)

## 2020-10-11 LAB — CBC
Hematocrit: 46.2 % (ref 34.0–46.6)
Hemoglobin: 15.2 g/dL (ref 11.1–15.9)
MCH: 31.8 pg (ref 26.6–33.0)
MCHC: 32.9 g/dL (ref 31.5–35.7)
MCV: 97 fL (ref 79–97)
Platelets: 334 10*3/uL (ref 150–450)
RBC: 4.78 x10E6/uL (ref 3.77–5.28)
RDW: 12.4 % (ref 11.7–15.4)
WBC: 6.6 10*3/uL (ref 3.4–10.8)

## 2020-10-11 LAB — HEMOGLOBIN A1C
Est. average glucose Bld gHb Est-mCnc: 120 mg/dL
Hgb A1c MFr Bld: 5.8 % — ABNORMAL HIGH (ref 4.8–5.6)

## 2020-10-28 ENCOUNTER — Other Ambulatory Visit (HOSPITAL_BASED_OUTPATIENT_CLINIC_OR_DEPARTMENT_OTHER): Payer: Self-pay

## 2020-10-29 MED ORDER — NITROGLYCERIN 0.4 MG SL SUBL: 0.4000 mg | SUBLINGUAL_TABLET | SUBLINGUAL | 12 refills | Status: DC | PRN

## 2021-01-21 ENCOUNTER — Telehealth: Payer: Self-pay | Admitting: Physician Assistant

## 2021-01-21 MED ORDER — CIPROFLOXACIN HCL 500 MG PO TABS: 500.0000 mg | ORAL_TABLET | Freq: Two times a day (BID) | ORAL | 0 refills | Status: AC

## 2021-01-21 MED ORDER — METRONIDAZOLE 500 MG PO TABS: 500.0000 mg | ORAL_TABLET | Freq: Three times a day (TID) | ORAL | 0 refills | Status: AC

## 2021-01-21 NOTE — Telephone Encounter (Signed)
Spoke with patient, she reports that her symptoms started last Wednesday, she thought Dicyclomine would help so she took that for 2 days and saw no improvement by Friday. Pt reports lower abdominal pain, she describes as a burning and cramping across the entire lower abdomen but predominately on the left side. Pt reports that the pain worsens when she needs to have a bowel movement. Pt denies any fever or nausea. Pt would like antibiotics. She states that she will go to the ED if she needs to but really doesn't want to do that. Please advise, thanks

## 2021-01-21 NOTE — Telephone Encounter (Signed)
Patient called stating she is having a flare-up of her diverticulitis and wanted to know if you could call in some antibiotics for [facility] to [facility] [address]?  [facility].  Please call and advise.

## 2021-01-21 NOTE — Telephone Encounter (Signed)
Spoke with patient in regards to recommendations, she would like [facility] to [facility] [address]. Pt has been scheduled for a follow up with Ellouise Newer, PA-C on Thursday, 02/07/21 at 2 PM. Pt verbalized understanding and had no concerns at the end of the call.

## 2021-02-07 ENCOUNTER — Ambulatory Visit (INDEPENDENT_AMBULATORY_CARE_PROVIDER_SITE_OTHER): Payer: Medicare HMO | Admitting: Physician Assistant

## 2021-02-07 ENCOUNTER — Encounter: Payer: Self-pay | Admitting: Physician Assistant

## 2021-02-07 VITALS — BP 112/62 | HR 66 | Ht 62.0 in | Wt 211.6 lb

## 2021-02-07 DIAGNOSIS — Z8719 Personal history of other diseases of the digestive system: Secondary | ICD-10-CM | POA: Diagnosis not present

## 2021-02-07 NOTE — Progress Notes (Signed)
Chief Complaint: Follow-up diverticulitis  HPI:    Valerie Fields is a 70 year old Caucasian female, known to Dr. Fuller Plan, with a past medical history of CHF (01/25/2020 echo with LVEF 55-60%), CAD (cardiac cath 10/13/2019 maintained on Brilinta), diverticulitis, reflux, IBS and multiple others, who presents to clinic today for follow-up of her diverticulitis.    09/21/2015 colonoscopy with mild diverticulosis of the splenic flexure, transverse colon, hepatic flexure and ascending colon.  Moderate diverticulosis in the sigmoid and descending colon.  Otherwise normal.  Repeat recommended 10 years.    04/09/2018 patient seen for lower abdominal pain given Cipro/Flagyl.    04/25/2020 patient followed in clinic for reflux and at that time was doing okay.  She consistently been taking Pantoprazole 40 mg in the morning.  Did continue with some "episodic" right upper quadrant burning.  She had a recent HIDA scan with biliary dyskinesia.  Patient was referred to [facility] for further discussion about possible cholecystectomy.  She was continued on Pantoprazole 40 twice a day.    01/21/2021 patient called and described that she was having a "flareup of her diverticulitis" and wanted some antibiotics.  At that time and described symptoms which had started a few days prior and she took Dicyclomine for 2 days but saw no improvement.  She had lower abdominal pain which she described as burning/cramping across the entire lower abdomen predominantly in the left side.  Was worse when she needed to have a bowel movement.  She was given Cipro 500 twice daily and Flagyl 500 3 times daily for 10 days.  Told to follow-up in clinic.    Today, the patient tells me that she was doing well on fiber supplement and a stool softener daily but went on a keto diet and ate a lot of nuts and fruits with seeds and feels like this triggered her diverticulitis.  She started with left lower quadrant pain that radiated across her lower abdomen worse with  a bowel movement.  Tells me it felt "exactly like the last time I had diverticulitis".  After using the Cipro and Flagyl she feels completely better.  Denies any continued abdominal pain.    Tells me she is planning a trip to [place] in the spring.    Denies fever, chills, weight loss, blood in her stool or symptoms that awaken her from sleep.  Past Medical History:  Diagnosis Date  . Allergy   . Asthma   . Chronic combined systolic and diastolic CHF 56/81/2751   Ischemic CM // Echocardiogram 7/21: EF 40-45, Gr 2 DD, mid to apical ant/ant-sept AK, RVSP 23.4, trivial MR  . Coronary artery disease    NSTEMI 7/21: oLAD 30, pLAD 99 >> PCI: DES; D1 90 >> PCI: POBA, prox and mid RCA 15, EF 35  . Diverticulitis   . Gallbladder problem   . GERD (gastroesophageal reflux disease)   . History of heart attack   . Hyperlipidemia 10/15/2019   Zetia DC'd during admit for NSTEMI 7/21 >> ? to Atorvastatin  . IBS (irritable colon syndrome)   . Ischemic cardiomyopathy 10/15/2019  . Joint pain   . Lactose intolerance   . Multiple food allergies   . Non-ST elevation (NSTEMI) MI 10/13/2019   PCI:  DES to LAD and POBA to D1    Past Surgical History:  Procedure Laterality Date  . BREAST BIOPSY Left    No Scar seen   . BREAST CYST EXCISION Left    No scar seen   .  CARDIAC CATHETERIZATION    . COLONOSCOPY    . CORONARY STENT INTERVENTION  10/13/2019  . CORONARY STENT INTERVENTION N/A 10/13/2019   Procedure: CORONARY STENT INTERVENTION;  Surgeon: Nelva Bush, MD;  Location: [facility];  Service: Cardiovascular;  Laterality: N/A;  mid lad  . LEFT HEART CATH AND CORONARY ANGIOGRAPHY N/A 10/13/2019   Procedure: LEFT HEART CATH AND CORONARY ANGIOGRAPHY;  Surgeon: Nelva Bush, MD;  Location: [facility];  Service: Cardiovascular;  Laterality: N/A;  . TONSILLECTOMY      Current Outpatient Medications  Medication Sig Dispense Refill  . Acetaminophen (TYLENOL ARTHRITIS PAIN PO) Take  by mouth as needed.    Marland Kitchen aspirin 81 MG chewable tablet Chew 1 tablet (81 mg total) by mouth daily. (Patient not taking: Reported on 10/10/2020)    . atorvastatin (LIPITOR) 80 MG tablet TAKE ONE TABLET BY MOUTH EVERYDAY AT BEDTIME 90 tablet 3  . azelastine (ASTELIN) 0.1 % nasal spray Place 1 spray into both nostrils daily. Use in each nostril as directed    . Chlorpheniramine Maleate (ALLERGY RELIEF PO) Take by mouth daily.     . clopidogrel (PLAVIX) 75 MG tablet Take 1 tablet (75 mg total) by mouth daily. 90 tablet 3  . dicyclomine (BENTYL) 10 MG capsule Take 1 tablet by mouth every 6 hours as needed. 30 capsule 0  . diphenhydrAMINE (SLEEP AID, DIPHENHYDRAMINE,) 25 MG tablet Take 25 mg by mouth at bedtime as needed for sleep. (Patient not taking: Reported on 10/10/2020)    . diphenhydrAMINE HCl (ALLERGY MED PO) Take by mouth as needed.     . docusate sodium (COLACE) 100 MG capsule Take 100 mg by mouth daily.     Marland Kitchen losartan (COZAAR) 25 MG tablet TAKE ONE TABLET BY MOUTH EVERY MORNING 90 tablet 3  . melatonin 3 MG TABS tablet Take 6 mg by mouth at bedtime.    . metoprolol succinate (TOPROL-XL) 25 MG 24 hr tablet TAKE ONE TABLET BY MOUTH EVERY MORNING 90 tablet 3  . metroNIDAZOLE (METROGEL) 0.75 % gel SMARTSIG:Sparingly Topical 1 to 2 Times Daily    . montelukast (SINGULAIR) 10 MG tablet Take 10 mg by mouth at bedtime.   3  . nitroGLYCERIN (NITROSTAT) 0.4 MG SL tablet Place 1 tablet (0.4 mg total) under the tongue every 5 (five) minutes as needed for chest pain. 25 tablet 12  . pantoprazole (PROTONIX) 40 MG tablet Take 1 tablet (40 mg total) by mouth 2 (two) times daily before a meal. 30 tablet 2  . psyllium (REGULOID) 0.52 g capsule Take 1.5 g by mouth daily.     No current facility-administered medications for this visit.    Allergies as of 02/07/2021 - Review Complete 10/10/2020  Allergen Reaction Noted  . Soy allergy Hives 04/07/2016  . Sulfa antibiotics Other (See Comments) 09/03/2015     Family History  Problem Relation Age of Onset  . GER disease Father   . Aneurysm Mother   . Hyperlipidemia Mother   . Obesity Mother   . Stomach cancer Maternal Grandfather   . Colon cancer Neg Hx   . Esophageal cancer Neg Hx   . Pancreatic cancer Neg Hx   . Rectal cancer Neg Hx   . Breast cancer Neg Hx     Social History   Socioeconomic History  . Marital status: Divorced    Spouse name: Not on file  . Number of children: 2  . Years of education: 78  . Highest education  level: Not on file  Occupational History  . Occupation: Retired  Tobacco Use  . Smoking status: Never  . Smokeless tobacco: Never  Vaping Use  . Vaping Use: Never used  Substance and Sexual Activity  . Alcohol use: Yes    Alcohol/week: 1.0 standard drink    Types: 1 Glasses of wine per week  . Drug use: No  . Sexual activity: Not Currently  Other Topics Concern  . Not on file  Social History Narrative  . Not on file   Social Determinants of Health   Financial Resource Strain: Not on file  Food Insecurity: Not on file  Transportation Needs: Not on file  Physical Activity: Not on file  Stress: Not on file  Social Connections: Not on file  Intimate Partner Violence: Not on file    Review of Systems:    Constitutional: No weight loss, fever or chills Cardiovascular: No chest pain Respiratory: No SOB  Gastrointestinal: See HPI and otherwise negative   Physical Exam:  Vital signs: BP 112/62   Pulse 66   Ht 5\' 2"  (1.575 m)   Wt 211 lb 9.6 oz (96 kg)   BMI 38.70 kg/m    Constitutional:   Pleasant overweight Caucasian female appears to be in NAD, Well developed, Well nourished, alert and cooperative Respiratory: Respirations even and unlabored. Lungs clear to auscultation bilaterally.   No wheezes, crackles, or rhonchi.  Cardiovascular: Normal S1, S2. No MRG. Regular rate and rhythm. No peripheral edema, cyanosis or pallor.  Gastrointestinal:  Soft, nondistended, nontender. No rebound  or guarding. Normal bowel sounds. No appreciable masses or hepatomegaly. Rectal:  Not performed.  Psychiatric: Oriented to person, place and time. Demonstrates good judgement and reason without abnormal affect or behaviors.  RELEVANT LABS AND IMAGING: CBC    Component Value Date/Time   WBC 6.6 10/10/2020 0949   WBC 15.2 (H) 10/14/2019 0850   RBC 4.78 10/10/2020 0949   RBC 4.98 10/14/2019 0850   HGB 15.2 10/10/2020 0949   HCT 46.2 10/10/2020 0949   PLT 334 10/10/2020 0949   MCV 97 10/10/2020 0949   MCH 31.8 10/10/2020 0949   MCH 31.5 10/14/2019 0850   MCHC 32.9 10/10/2020 0949   MCHC 34.0 10/14/2019 0850   RDW 12.4 10/10/2020 0949   LYMPHSABS 1.9 01/30/2020 1050   MONOABS 0.7 02/03/2019 1259   EOSABS 0.2 01/30/2020 1050   BASOSABS 0.0 01/30/2020 1050    CMP     Component Value Date/Time   NA 141 10/10/2020 0949   K 4.4 10/10/2020 0949   CL 105 10/10/2020 0949   CO2 21 10/10/2020 0949   GLUCOSE 93 10/10/2020 0949   GLUCOSE 118 (H) 10/14/2019 0253   BUN 13 10/10/2020 0949   CREATININE 0.88 10/10/2020 0949   CALCIUM 9.5 10/10/2020 0949   PROT 6.8 10/10/2020 0949   ALBUMIN 4.4 10/10/2020 0949   AST 29 10/10/2020 0949   ALT 39 (H) 10/10/2020 0949   ALKPHOS 83 10/10/2020 0949   BILITOT 0.6 10/10/2020 0949   GFRNONAA 75 01/30/2020 1050   GFRAA 86 01/30/2020 1050    Assessment: 1.  History of diverticulitis: Treated empirically with Cipro and Flagyl and symptoms are gone  Plan: 1.  Continue Colace and fiber supplement daily.  Patient tells me her stools are maybe slightly harder right now, she can try 2 Colace a day instead. 2.  Maintain a high-fiber diet and regular stools. 3.  Patient to follow in clinic with [place] as needed.  Ellouise Newer, PA-C [facility] 02/07/2021, 1:51 PM  Cc: Marda Stalker, PA-C

## 2021-02-07 NOTE — Progress Notes (Signed)
Reviewed and agree with management plan.  Rubena Roseman T. Vesper Trant, MD FACG 

## 2021-02-07 NOTE — Patient Instructions (Signed)
Follow up as needed

## 2021-04-19 ENCOUNTER — Other Ambulatory Visit: Payer: Self-pay | Admitting: Cardiovascular Disease

## 2021-04-23 NOTE — Progress Notes (Signed)
Chief Complaint  Patient presents with   Follow-up    CAD   History of Present Illness: 71 yo female with history of CAD, ischemic cardiomyopathy, asthma, sleep apnea and GERD who is here today for cardiac follow up. She was admitted to [facility] July 2021 with a NSTEMI. Cardiac cath with severe mid LAD stenosis involving a Diagonal. The LAD was treated with a stent and the Diagonal was treated with balloon angioplasty. Echo post MI with LVEF=40-45%. Most recent echo 01/25/20 with LVEF=55-60%. No significant valve disease. She has been treated with Protonix for GERD. She is now on CPAP for sleep apnea.   She is here today for follow up. The patient denies any chest pain, dyspnea, palpitations, lower extremity edema, orthopnea, PND, dizziness, near syncope or syncope.   Primary Care Physician: Marda Stalker, PA-C  Past Medical History:  Diagnosis Date   Allergy    Asthma    Chronic combined systolic and diastolic CHF 09/73/5329   Ischemic CM // Echocardiogram 7/21: EF 40-45, Gr 2 DD, mid to apical ant/ant-sept AK, RVSP 23.4, trivial MR   Coronary artery disease    NSTEMI 7/21: oLAD 30, pLAD 99 >> PCI: DES; D1 90 >> PCI: POBA, prox and mid RCA 15, EF 35   Diverticulitis    Gallbladder problem    GERD (gastroesophageal reflux disease)    History of heart attack    Hyperlipidemia 10/15/2019   Zetia DC'd during admit for NSTEMI 7/21 >> ? to Atorvastatin   IBS (irritable colon syndrome)    Ischemic cardiomyopathy 10/15/2019   Joint pain    Lactose intolerance    Multiple food allergies    Non-ST elevation (NSTEMI) MI 10/13/2019   PCI:  DES to LAD and POBA to D1    Past Surgical History:  Procedure Laterality Date   BREAST BIOPSY Left    No Scar seen    BREAST CYST EXCISION Left    No scar seen    CARDIAC CATHETERIZATION     COLONOSCOPY     CORONARY STENT INTERVENTION  10/13/2019   CORONARY STENT INTERVENTION N/A 10/13/2019   Procedure: CORONARY STENT INTERVENTION;  Surgeon: Nelva Bush, MD;  Location: [facility];  Service: Cardiovascular;  Laterality: N/A;  mid lad   LEFT HEART CATH AND CORONARY ANGIOGRAPHY N/A 10/13/2019   Procedure: LEFT HEART CATH AND CORONARY ANGIOGRAPHY;  Surgeon: Nelva Bush, MD;  Location: [facility];  Service: Cardiovascular;  Laterality: N/A;   TONSILLECTOMY      Current Outpatient Medications  Medication Sig Dispense Refill   Acetaminophen (TYLENOL ARTHRITIS PAIN PO) Take by mouth as needed.     atorvastatin (LIPITOR) 80 MG tablet TAKE ONE TABLET BY MOUTH EVERYDAY AT BEDTIME 90 tablet 1   azelastine (ASTELIN) 0.1 % nasal spray Place 1 spray into both nostrils daily. Use in each nostril as directed     Chlorpheniramine Maleate (ALLERGY RELIEF PO) Take by mouth daily.      clopidogrel (PLAVIX) 75 MG tablet Take 1 tablet (75 mg total) by mouth daily. 90 tablet 3   dicyclomine (BENTYL) 10 MG capsule Take 1 tablet by mouth every 6 hours as needed. 30 capsule 0   diphenhydrAMINE (SLEEP AID, DIPHENHYDRAMINE,) 25 MG tablet Take 25 mg by mouth at bedtime as needed for sleep.     docusate sodium (COLACE) 100 MG capsule Take 100 mg by mouth daily.      losartan (COZAAR) 25 MG tablet TAKE ONE TABLET BY  MOUTH EVERY MORNING 90 tablet 1   metoprolol succinate (TOPROL-XL) 25 MG 24 hr tablet TAKE ONE TABLET BY MOUTH EVERY MORNING 90 tablet 1   metroNIDAZOLE (METROGEL) 0.75 % gel SMARTSIG:Sparingly Topical 1 to 2 Times Daily     montelukast (SINGULAIR) 10 MG tablet Take 10 mg by mouth at bedtime.   3   nitroGLYCERIN (NITROSTAT) 0.4 MG SL tablet Place 1 tablet (0.4 mg total) under the tongue every 5 (five) minutes as needed for chest pain. 25 tablet 12   pantoprazole (PROTONIX) 40 MG tablet Take 1 tablet (40 mg total) by mouth 2 (two) times daily before a meal. 30 tablet 2   psyllium (REGULOID) 0.52 g capsule Take 1.5 g by mouth daily.     No current facility-administered medications for this visit.    Allergies  Allergen  Reactions   Soy Allergy Hives   Sulfa Antibiotics Other (See Comments)    As a child, turned red above neck    Social History   Socioeconomic History   Marital status: Divorced    Spouse name: Not on file   Number of children: 2   Years of education: 16   Highest education level: Not on file  Occupational History   Occupation: Retired  Tobacco Use   Smoking status: Never   Smokeless tobacco: Never  Vaping Use   Vaping Use: Never used  Substance and Sexual Activity   Alcohol use: Yes    Alcohol/week: 1.0 standard drink    Types: 1 Glasses of wine per week   Drug use: No   Sexual activity: Not Currently  Other Topics Concern   Not on file  Social History Narrative   Not on file   Social Determinants of Health   Financial Resource Strain: Not on file  Food Insecurity: Not on file  Transportation Needs: Not on file  Physical Activity: Not on file  Stress: Not on file  Social Connections: Not on file  Intimate Partner Violence: Not on file    Family History  Problem Relation Age of Onset   GER disease Father    Aneurysm Mother    Hyperlipidemia Mother    Obesity Mother    Stomach cancer Maternal Grandfather    Colon cancer Neg Hx    Esophageal cancer Neg Hx    Pancreatic cancer Neg Hx    Rectal cancer Neg Hx    Breast cancer Neg Hx     Review of Systems:  As stated in the HPI and otherwise negative.   BP 108/68    Pulse 63    Ht 5\' 2"  (1.575 m)    Wt 211 lb 12.8 oz (96.1 kg)    SpO2 97%    BMI 38.74 kg/m   Physical Examination: General: Well developed, well nourished, NAD  HEENT: OP clear, mucus membranes moist  SKIN: warm, dry. No rashes. Neuro: No focal deficits  Musculoskeletal: Muscle strength 5/5 all ext  Psychiatric: Mood and affect normal  Neck: No JVD, no carotid bruits, no thyromegaly, no lymphadenopathy.  Lungs:Clear bilaterally, no wheezes, rhonci, crackles Cardiovascular: Regular rate and rhythm. No murmurs, gallops or rubs. Abdomen:Soft.  Bowel sounds present. Non-tender.  Extremities: No lower extremity edema. Pulses are 2 + in the bilateral DP/PT.  EKG:  EKG is ordered today. The ekg ordered today demonstrates Sinus, poor R wave progression precordial leads  Echo 01/25/20:  1. Left ventricular ejection fraction, by estimation, is 55 to 60%. The  left ventricle has normal function.  The left ventricle has no regional  wall motion abnormalities. Left ventricular diastolic parameters are  consistent with Grade II diastolic  dysfunction (pseudonormalization).   2. Right ventricular systolic function is normal. The right ventricular  size is normal. There is normal pulmonary artery systolic pressure.   3. Left atrial size was mildly dilated.   4. The mitral valve is abnormal. Trivial mitral valve regurgitation. No  evidence of mitral stenosis. Moderate mitral annular calcification.   5. The aortic valve is normal in structure. Aortic valve regurgitation is  not visualized. No aortic stenosis is present.   6. The inferior vena cava is normal in size with greater than 50%  respiratory variability, suggesting right atrial pressure of 3 mmHg.   Recent Labs: 10/10/2020: ALT 39; BUN 13; Creatinine, Ser 0.88; Hemoglobin 15.2; Platelets 334; Potassium 4.4; Sodium 141   Lipid Panel    Component Value Date/Time   CHOL 111 10/10/2020 0949   TRIG 135 10/10/2020 0949   HDL 45 10/10/2020 0949   CHOLHDL 2.5 10/10/2020 0949   CHOLHDL 3.7 10/13/2019 1502   VLDL 32 10/13/2019 1502   LDLCALC 43 10/10/2020 0949     Wt Readings from Last 3 Encounters:  04/24/21 211 lb 12.8 oz (96.1 kg)  02/07/21 211 lb 9.6 oz (96 kg)  10/10/20 208 lb 12.8 oz (94.7 kg)    Assessment and Plan:   1. CAD without angina: No chest pain. Continue Plavix, statin and beta blocker.   2. Ischemic cardiomyopathy: LVEF now normal. Will continue ARB and beta blocker  3. Chronic diastolic CHF: No volume overload on exam. Weight is stable.   4.  Hyperlipidemia: LDL 43 in July 2022. Continue statin.    Current medicines are reviewed at length with the patient today.  The patient does not have concerns regarding medicines.  The following changes have been made:  no change  Labs/ tests ordered today include:   Orders Placed This Encounter  Procedures   EKG 12-Lead   Disposition:   F/U with me in 12 months   Signed, Lauree Chandler, MD 04/24/2021 9:49 AM    [facility] [address], [place], [place]  [address] Phone: [phone]; Fax: [phone]

## 2021-04-24 ENCOUNTER — Other Ambulatory Visit: Payer: Self-pay

## 2021-04-24 ENCOUNTER — Ambulatory Visit (INDEPENDENT_AMBULATORY_CARE_PROVIDER_SITE_OTHER): Payer: Medicare HMO | Admitting: Cardiovascular Disease

## 2021-04-24 ENCOUNTER — Encounter: Payer: Self-pay | Admitting: Cardiovascular Disease

## 2021-04-24 VITALS — BP 108/68 | HR 63 | Ht 62.0 in | Wt 211.8 lb

## 2021-04-24 DIAGNOSIS — E785 Hyperlipidemia, unspecified: Secondary | ICD-10-CM | POA: Diagnosis not present

## 2021-04-24 DIAGNOSIS — I255 Ischemic cardiomyopathy: Secondary | ICD-10-CM

## 2021-04-24 DIAGNOSIS — I2510 Atherosclerotic heart disease of native coronary artery without angina pectoris: Secondary | ICD-10-CM | POA: Diagnosis not present

## 2021-04-24 NOTE — Patient Instructions (Signed)
Medication Instructions:  No changes today *If you need a refill on your cardiac medications before your next appointment, please call your pharmacy*   Lab Work: none   Testing/Procedures: none   Follow-Up: At [facility], you and your health needs are our priority.  As part of our continuing mission to provide you with exceptional heart care, we have created designated Provider Care Teams.  These Care Teams include your primary Cardiologist (physician) and Advanced Practice Providers (APPs -  Physician Assistants and Nurse Practitioners) who all work together to provide you with the care you need, when you need it.   Your next appointment:   12 month(s)  The format for your next appointment:   In Person  Provider:   Lauree Chandler, MD     Other Instructions

## 2021-05-06 ENCOUNTER — Telehealth: Payer: Self-pay | Admitting: Physician Assistant

## 2021-05-06 MED ORDER — DICYCLOMINE HCL 10 MG PO CAPS: 10.0000 mg | ORAL_CAPSULE | Freq: Three times a day (TID) | ORAL | 1 refills | Status: DC

## 2021-05-06 NOTE — Telephone Encounter (Signed)
Patient called back this afternoon stating she needed a refill of the dicyclomine.  [facility] uses [facility] [address] in [place].  Thank you.

## 2021-05-06 NOTE — Telephone Encounter (Signed)
Returned call to patient. She reports that her "gut has not been right for the past couple of weeks". Pt states that she was recently treated with a 7-day course of Augmentin for a UTI. She states the she finished Augmentin last Friday. Pt reports that she had burning pain across her lower abdomen all night that kept her up. Pt reports that she will have abdominal pain and then have the urge to have a bowel movement. Pain is relieved for a short period after BM. Pt states that she took a Dicyclomine this morning and her "gut calmed down some" but it is still bothering her. Pt is wondering if she may be having a diverticulitis flare. She denied any fever, nausea, or vomiting. Please advise, thanks.

## 2021-05-06 NOTE — Addendum Note (Signed)
Addended by: Yevette Edwards on: 05/06/2021 01:48 PM   Modules accepted: Orders

## 2021-05-06 NOTE — Telephone Encounter (Signed)
Please let the patient know that typically Augmentin would treat a diverticulitis flare as well.  It may be more likely that she is having some irritable bowel symptoms related to recent antibiotic use.  Recommend she start a daily probiotic such as Align over-the-counter and go ahead and schedule out her Dicyclomine 3-4 times a day 20 to 30 minutes before meals and at bedtime for the next few days to see if this helps calm things down.  If this does not help over the next 48 to 72 hours her symptoms seem to worsen and she develops a fever then she needs to call and let us know.   Thanks, JL L

## 2021-05-06 NOTE — Telephone Encounter (Signed)
Returned call to patient. We have reviewed Valerie Fields's recommendations. Pt states that she thinks she has enough Dicyclomine and thinks she may have a refill. If not, she will call us back. Pt knows to call back if her symptoms seem to worsen of if she develops a fever. Pt verbalized understanding and had no concerns at the end of the call.

## 2021-05-06 NOTE — Telephone Encounter (Signed)
Refill for Dicyclomine sent to pharmacy.

## 2021-05-06 NOTE — Telephone Encounter (Signed)
Patient called wanting to speak with a nurse about Diverticulitis Flare up and needs to know how to manage. Seeking advice, please advise.

## 2021-05-09 NOTE — Telephone Encounter (Signed)
Patient called to speak with you regarding Diverticulitis flare up.

## 2021-05-09 NOTE — Telephone Encounter (Signed)
Returned call to patient. She reported that she is still not well. She reports starting Align and Dicyclomine on Tuesday. Pt reports that she is having less cramping and pain when she takes the Dicyclomine. She wanted to know if she needed to take a laxative, her last BM was today. She denies signs of constipation or straining, I told her the laxative was not necessary. Pt reports that she has not been eating as much. She states that last night she had some upper abdominal pain, she reports being off of her Protonix 40 mg for a while. I told her that if she is having epigastric pain then she can try adding Protonix back. I told pt that she will need to try the new regimen for a few more days, she knows that it is common to have residual pain after diverticulitis. Pt will try this for a few days and will let us know if no improvement by early next week or sooner if she develops worsening pain or a fever. Pt verbalized understanding and had no concerns at the end of the call.

## 2021-05-13 NOTE — Telephone Encounter (Signed)
Left message on machine to call back  

## 2021-05-13 NOTE — Telephone Encounter (Signed)
Patient called states she is still having abdominal pain and is now also feeling nauseous. Seeking further advise.

## 2021-05-15 NOTE — Telephone Encounter (Signed)
Called and spoke with patient. She reports that she is still having abdominal pain. She states that the pain is not as bad but it is still there. Pt reports that she has had issues with probiotics causing abdominal cramping in the past so she stopped the Align. Pt has been scheduled for an appt with Ellouise Newer, PA-C on Thursday, 05/16/21 at 2:30 pm. Pt knows that she will need to check in on the 3rd floor. Pt vebralized understanding and had no concerns at the end of the call.   Barbette Or, CMA made aware that pt was added the day prior.

## 2021-05-16 ENCOUNTER — Ambulatory Visit (INDEPENDENT_AMBULATORY_CARE_PROVIDER_SITE_OTHER): Payer: Medicare HMO | Admitting: Physician Assistant

## 2021-05-16 ENCOUNTER — Encounter: Payer: Self-pay | Admitting: Physician Assistant

## 2021-05-16 ENCOUNTER — Other Ambulatory Visit: Payer: Self-pay

## 2021-05-16 ENCOUNTER — Ambulatory Visit (INDEPENDENT_AMBULATORY_CARE_PROVIDER_SITE_OTHER)
Admission: RE | Admit: 2021-05-16 | Discharge: 2021-05-16 | Disposition: A | Discharge status: Home / Self Care | Payer: Medicare HMO | Source: Ambulatory Visit | Attending: Physician Assistant | Admitting: Physician Assistant

## 2021-05-16 VITALS — BP 124/72 | HR 66 | Ht 62.0 in | Wt 209.0 lb

## 2021-05-16 DIAGNOSIS — K56609 Unspecified intestinal obstruction, unspecified as to partial versus complete obstruction: Secondary | ICD-10-CM | POA: Diagnosis not present

## 2021-05-16 DIAGNOSIS — R110 Nausea: Secondary | ICD-10-CM

## 2021-05-16 DIAGNOSIS — Z8719 Personal history of other diseases of the digestive system: Secondary | ICD-10-CM

## 2021-05-16 DIAGNOSIS — R140 Abdominal distension (gaseous): Secondary | ICD-10-CM

## 2021-05-16 DIAGNOSIS — R1084 Generalized abdominal pain: Secondary | ICD-10-CM

## 2021-05-16 NOTE — Progress Notes (Signed)
Chief Complaint: Follow-up abdominal pain  HPI:    Valerie Fields is a 71 year old female, known to Dr. Fuller Plan, with a past medical history as listed below including diverticulitis, who presents to clinic today for follow-up of her abdominal pain.    05/06/2021 patient called discussing a diverticulitis flare.  At that time discussed that she had just finished a 7-day course of Augmentin for UTI and had burning across her lower abdomen.  Apparently symptoms had relieved after a bowel movement and she had taken some Dicyclomine.  That time discussed that Augmentin would treat a diverticulitis flare as well and recommend that she start a daily probiotic such as Align and schedule out her Dicyclomine 3-4 times a day.  Patient later called back and said she needed a refill of Dicyclomine.    05/09/2021 patient called back and said she is still not feeling well.  She is started Psychologist, clinical and was having less cramping and pain and wondered about a laxative.  Describes some upper abdominal pain then and being off of her Pantoprazole for a while.  Recommend she add back in 40 mg a day.    05/13/2021 patient called back in and was still having abdominal pain and was nauseous.  Apparently the probiotic was causing cramping so she stopped it.     05/13/2021 CBC and CMP with PCP were normal.    Today, the patient tells me that on 05/06/2021 she went out to dinner for her son's birthday and had grouper and crab and that night had terrible cramping that was very painful rated as a 10/10 in about 6-10 of these cramps an hour.  She called [place] and started Dicyclomine 4 times daily which really did not help at all.  She then started Align and took 2 days of this but it made everything worse.  Tells me she had a similar reaction with activia in the past.  Over the past week and a half she has been nauseous and bloated and feels like she looks "pregnant".  Tells me she hears a lot of grumbling in her abdomen and loud stomach  noises.  The pain has slightly decreased and now to may be 2-3 every hour of cramping and it is not quite as severe.  She is miserable and has really only taken a few bites of food every day.  Reports a weight loss of about 2 pounds.  Tells me she is only getting "squiggles" of bowel movement out and has not really had a good bowel movement over this time period either.  Also has not been able to pass much gas but when she does it is somewhat of a relief.    Denies fever, chills, blood in her stool or symptoms that awaken her from sleep.  Past Medical History:  Diagnosis Date   Allergy    Asthma    Chronic combined systolic and diastolic CHF 79/89/2119   Ischemic CM // Echocardiogram 7/21: EF 40-45, Gr 2 DD, mid to apical ant/ant-sept AK, RVSP 23.4, trivial MR   Coronary artery disease    NSTEMI 7/21: oLAD 30, pLAD 99 >> PCI: DES; D1 90 >> PCI: POBA, prox and mid RCA 15, EF 35   Diverticulitis    Gallbladder problem    GERD (gastroesophageal reflux disease)    History of heart attack    Hyperlipidemia 10/15/2019   Zetia DC'd during admit for NSTEMI 7/21 >> ? to Atorvastatin   IBS (irritable colon syndrome)  Ischemic cardiomyopathy 10/15/2019   Joint pain    Lactose intolerance    Multiple food allergies    Non-ST elevation (NSTEMI) MI 10/13/2019   PCI:  DES to LAD and POBA to D1    Past Surgical History:  Procedure Laterality Date   BREAST BIOPSY Left    No Scar seen    BREAST CYST EXCISION Left    No scar seen    CARDIAC CATHETERIZATION     COLONOSCOPY     CORONARY STENT INTERVENTION  10/13/2019   CORONARY STENT INTERVENTION N/A 10/13/2019   Procedure: CORONARY STENT INTERVENTION;  Surgeon: Nelva Bush, MD;  Location: [facility];  Service: Cardiovascular;  Laterality: N/A;  mid lad   LEFT HEART CATH AND CORONARY ANGIOGRAPHY N/A 10/13/2019   Procedure: LEFT HEART CATH AND CORONARY ANGIOGRAPHY;  Surgeon: Nelva Bush, MD;  Location: [facility];  Service:  Cardiovascular;  Laterality: N/A;   TONSILLECTOMY      Current Outpatient Medications  Medication Sig Dispense Refill   Acetaminophen (TYLENOL ARTHRITIS PAIN PO) Take by mouth as needed.     atorvastatin (LIPITOR) 80 MG tablet TAKE ONE TABLET BY MOUTH EVERYDAY AT BEDTIME 90 tablet 1   azelastine (ASTELIN) 0.1 % nasal spray Place 1 spray into both nostrils daily. Use in each nostril as directed     Chlorpheniramine Maleate (ALLERGY RELIEF PO) Take by mouth daily.      clopidogrel (PLAVIX) 75 MG tablet Take 1 tablet (75 mg total) by mouth daily. 90 tablet 3   dicyclomine (BENTYL) 10 MG capsule Take 1 capsule (10 mg total) by mouth 4 (four) times daily -  before meals and at bedtime. 120 capsule 1   diphenhydrAMINE (SLEEP AID, DIPHENHYDRAMINE,) 25 MG tablet Take 25 mg by mouth at bedtime as needed for sleep.     docusate sodium (COLACE) 100 MG capsule Take 100 mg by mouth daily.      losartan (COZAAR) 25 MG tablet TAKE ONE TABLET BY MOUTH EVERY MORNING 90 tablet 1   metoprolol succinate (TOPROL-XL) 25 MG 24 hr tablet TAKE ONE TABLET BY MOUTH EVERY MORNING 90 tablet 1   metroNIDAZOLE (METROGEL) 0.75 % gel SMARTSIG:Sparingly Topical 1 to 2 Times Daily     montelukast (SINGULAIR) 10 MG tablet Take 10 mg by mouth at bedtime.   3   nitroGLYCERIN (NITROSTAT) 0.4 MG SL tablet Place 1 tablet (0.4 mg total) under the tongue every 5 (five) minutes as needed for chest pain. 25 tablet 12   pantoprazole (PROTONIX) 40 MG tablet Take 1 tablet (40 mg total) by mouth 2 (two) times daily before a meal. 30 tablet 2   psyllium (REGULOID) 0.52 g capsule Take 1.5 g by mouth daily.     No current facility-administered medications for this visit.    Allergies as of 05/16/2021 - Review Complete 05/16/2021  Allergen Reaction Noted   Soy allergy Hives 04/07/2016   Sulfa antibiotics Other (See Comments) 09/03/2015    Family History  Problem Relation Age of Onset   GER disease Father    Aneurysm Mother     Hyperlipidemia Mother    Obesity Mother    Stomach cancer Maternal Grandfather    Colon cancer Neg Hx    Esophageal cancer Neg Hx    Pancreatic cancer Neg Hx    Rectal cancer Neg Hx    Breast cancer Neg Hx     Social History   Socioeconomic History   Marital status: Divorced  Spouse name: Not on file   Number of children: 2   Years of education: 70   Highest education level: Not on file  Occupational History   Occupation: Retired  Tobacco Use   Smoking status: Never   Smokeless tobacco: Never  Vaping Use   Vaping Use: Never used  Substance and Sexual Activity   Alcohol use: Yes    Alcohol/week: 1.0 standard drink    Types: 1 Glasses of wine per week   Drug use: No   Sexual activity: Not Currently  Other Topics Concern   Not on file  Social History Narrative   Not on file   Social Determinants of Health   Financial Resource Strain: Not on file  Food Insecurity: Not on file  Transportation Needs: Not on file  Physical Activity: Not on file  Stress: Not on file  Social Connections: Not on file  Intimate Partner Violence: Not on file    Review of Systems:    Constitutional: No weight loss, fever or chills Cardiovascular: No chest pain   Respiratory: No SOB  Gastrointestinal: See HPI and otherwise negative   Physical Exam:  Vital signs: BP 124/72    Pulse 66    Ht 5\' 2"  (1.575 m)    Wt 209 lb (94.8 kg)    SpO2 97%    BMI 38.23 kg/m    Constitutional:   Pleasant Caucasian female appears to be in NAD, Well developed, Well nourished, alert and cooperative Respiratory: Respirations even and unlabored. Lungs clear to auscultation bilaterally.   No wheezes, crackles, or rhonchi.  Cardiovascular: Normal S1, S2. No MRG. Regular rate and rhythm. No peripheral edema, cyanosis or pallor.  Gastrointestinal: Tense, moderate distention, generalized TTP worse in bilateral lower quadrants with some involuntary guarding and high-pitched/hypertympanic bowel sounds in the upper  quadrants with decreased bowel sounds in the lower quadrants bilaterally.  No appreciable masses or hepatomegaly. Rectal:  Not performed.  Psychiatric: Oriented to person, place and time. Demonstrates good judgement and reason without abnormal affect or behaviors.  RELEVANT LABS AND IMAGING: CBC    Component Value Date/Time   WBC 6.6 10/10/2020 0949   WBC 15.2 (H) 10/14/2019 0850   RBC 4.78 10/10/2020 0949   RBC 4.98 10/14/2019 0850   HGB 15.2 10/10/2020 0949   HCT 46.2 10/10/2020 0949   PLT 334 10/10/2020 0949   MCV 97 10/10/2020 0949   MCH 31.8 10/10/2020 0949   MCH 31.5 10/14/2019 0850   MCHC 32.9 10/10/2020 0949   MCHC 34.0 10/14/2019 0850   RDW 12.4 10/10/2020 0949   LYMPHSABS 1.9 01/30/2020 1050   MONOABS 0.7 02/03/2019 1259   EOSABS 0.2 01/30/2020 1050   BASOSABS 0.0 01/30/2020 1050    CMP     Component Value Date/Time   NA 141 10/10/2020 0949   K 4.4 10/10/2020 0949   CL 105 10/10/2020 0949   CO2 21 10/10/2020 0949   GLUCOSE 93 10/10/2020 0949   GLUCOSE 118 (H) 10/14/2019 0253   BUN 13 10/10/2020 0949   CREATININE 0.88 10/10/2020 0949   CALCIUM 9.5 10/10/2020 0949   PROT 6.8 10/10/2020 0949   ALBUMIN 4.4 10/10/2020 0949   AST 29 10/10/2020 0949   ALT 39 (H) 10/10/2020 0949   ALKPHOS 83 10/10/2020 0949   BILITOT 0.6 10/10/2020 0949   GFRNONAA 75 01/30/2020 1050   GFRAA 86 01/30/2020 1050    Assessment: 1.  Generalized abdominal pain: Over the past week and a half, worsened with abdominal cramping  and helped by Dicyclomine, history of diverticulitis and IBS, abdominal exam with tense abdomen and hypertympanic bowel sounds; concern for bowel obstruction versus ileus versus colitis versus diverticulitis 2.  Decrease in bowel movements: With above 3.  Bloating and abdominal distention: With above 4.  Nausea: With above  Plan: 1.  Overall based on exam it sounds like patient may have a bowel obstruction or partial bowel obstruction given that she is still  passing small amounts of stool.  Ordered stat x-ray two-view of the abdomen. 2.  Also ordered CT of the abdomen and pelvis with contrast for tomorrow.  Pending results of above could possibly cancel this.  Discussed with patient.  She verbalized understanding. 3.  Reviewed recent CBC and CMP on 2/13 which were normal. 4.  Recommend patient discontinue Dicyclomine with concerns for obstruction. 5.  Encouraged the patient to stay on a clear liquid diet for now in minimal amounts as tolerated. 6.  In the future pending these results could consider SIBO testing given that taking probiotics is so painful for the patient 7.  Patient to follow in clinic per recommendations after above.  Ellouise Newer, PA-C [facility] 05/16/2021, 2:35 PM  Cc: Marda Stalker, PA-C

## 2021-05-16 NOTE — Patient Instructions (Addendum)
Your provider has requested that you have an abdominal x ray before leaving today. Please go to the basement floor to our Radiology department for the test.  You have been scheduled for a CT scan of the abdomen and pelvis at [facility] ([address]---this is in the same building as [employer]).   You are scheduled on Friday 05/17/21 at 10:45 am. You should arrive 15 minutes prior to your appointment time for registration. Please follow the written instructions below on the day of your exam:  WARNING: IF YOU ARE ALLERGIC TO IODINE/X-RAY DYE, PLEASE NOTIFY RADIOLOGY IMMEDIATELY AT [phone]! YOU WILL BE GIVEN A 13 HOUR PREMEDICATION PREP.  1) Do not eat or drink anything after 6:45 am (4 hours prior to your test) 2) You have been given 2 bottles of oral contrast to drink. The solution may taste better if refrigerated, but do NOT add ice or any other liquid to this solution. Shake well before drinking.    Drink 1 bottle of contrast @ 8:45 am (2 hours prior to your exam)  Drink 1 bottle of contrast @ 9:45 am (1 hour prior to your exam)  You may take any medications as prescribed with a small amount of water, if necessary. If you take any of the following medications: METFORMIN, GLUCOPHAGE, GLUCOVANCE, AVANDAMET, RIOMET, FORTAMET, [place] MET, JANUMET, GLUMETZA or METAGLIP, you MAY be asked to HOLD this medication 48 hours AFTER the exam.  The purpose of you drinking the oral contrast is to aid in the visualization of your intestinal tract. The contrast solution may cause some diarrhea. Depending on your individual set of symptoms, you may also receive an intravenous injection of x-ray contrast/dye. Plan on being at [facility] for 30 minutes or longer, depending on the type of exam you are having performed.  This test typically takes 30-45 minutes to complete.  If you have any questions regarding your exam or if you need to reschedule, you may call the CT  department at [phone] between the hours of 8:00 am and 5:00 pm, Monday-Friday.  ___________________________________________________________________

## 2021-05-17 ENCOUNTER — Emergency Department (HOSPITAL_COMMUNITY): Payer: Medicare HMO

## 2021-05-17 ENCOUNTER — Ambulatory Visit (INDEPENDENT_AMBULATORY_CARE_PROVIDER_SITE_OTHER)
Admission: RE | Admit: 2021-05-17 | Discharge: 2021-05-17 | Disposition: A | Discharge status: Home / Self Care | Payer: Medicare HMO | Source: Ambulatory Visit | Attending: Physician Assistant | Admitting: Physician Assistant

## 2021-05-17 ENCOUNTER — Telehealth: Payer: Self-pay | Admitting: Physician Assistant

## 2021-05-17 ENCOUNTER — Encounter (HOSPITAL_COMMUNITY): Payer: Self-pay

## 2021-05-17 ENCOUNTER — Inpatient Hospital Stay (HOSPITAL_COMMUNITY)
Admission: EM | Admit: 2021-05-17 | Discharge: 2021-05-30 | DRG: 329 | Disposition: A | Discharge status: Home / Self Care | Payer: Medicare HMO | Attending: Internal Medicine | Admitting: Internal Medicine

## 2021-05-17 ENCOUNTER — Other Ambulatory Visit: Payer: Self-pay

## 2021-05-17 DIAGNOSIS — Y92230 Patient room in hospital as the place of occurrence of the external cause: Secondary | ICD-10-CM | POA: Diagnosis not present

## 2021-05-17 DIAGNOSIS — I2510 Atherosclerotic heart disease of native coronary artery without angina pectoris: Secondary | ICD-10-CM

## 2021-05-17 DIAGNOSIS — K5732 Diverticulitis of large intestine without perforation or abscess without bleeding: Secondary | ICD-10-CM | POA: Diagnosis present

## 2021-05-17 DIAGNOSIS — X500XXA Overexertion from strenuous movement or load, initial encounter: Secondary | ICD-10-CM | POA: Diagnosis not present

## 2021-05-17 DIAGNOSIS — K529 Noninfective gastroenteritis and colitis, unspecified: Secondary | ICD-10-CM | POA: Diagnosis not present

## 2021-05-17 DIAGNOSIS — K56691 Other complete intestinal obstruction: Secondary | ICD-10-CM | POA: Diagnosis present

## 2021-05-17 DIAGNOSIS — R1030 Lower abdominal pain, unspecified: Secondary | ICD-10-CM | POA: Diagnosis not present

## 2021-05-17 DIAGNOSIS — S29011A Strain of muscle and tendon of front wall of thorax, initial encounter: Secondary | ICD-10-CM | POA: Diagnosis not present

## 2021-05-17 DIAGNOSIS — I959 Hypotension, unspecified: Secondary | ICD-10-CM | POA: Diagnosis not present

## 2021-05-17 DIAGNOSIS — I5042 Chronic combined systolic (congestive) and diastolic (congestive) heart failure: Secondary | ICD-10-CM | POA: Diagnosis present

## 2021-05-17 DIAGNOSIS — I252 Old myocardial infarction: Secondary | ICD-10-CM

## 2021-05-17 DIAGNOSIS — R140 Abdominal distension (gaseous): Secondary | ICD-10-CM | POA: Diagnosis not present

## 2021-05-17 DIAGNOSIS — I10 Essential (primary) hypertension: Secondary | ICD-10-CM | POA: Diagnosis not present

## 2021-05-17 DIAGNOSIS — Z7902 Long term (current) use of antithrombotics/antiplatelets: Secondary | ICD-10-CM

## 2021-05-17 DIAGNOSIS — E785 Hyperlipidemia, unspecified: Secondary | ICD-10-CM | POA: Diagnosis present

## 2021-05-17 DIAGNOSIS — Z8349 Family history of other endocrine, nutritional and metabolic diseases: Secondary | ICD-10-CM

## 2021-05-17 DIAGNOSIS — K219 Gastro-esophageal reflux disease without esophagitis: Secondary | ICD-10-CM | POA: Diagnosis present

## 2021-05-17 DIAGNOSIS — G4733 Obstructive sleep apnea (adult) (pediatric): Secondary | ICD-10-CM | POA: Diagnosis present

## 2021-05-17 DIAGNOSIS — Z79899 Other long term (current) drug therapy: Secondary | ICD-10-CM

## 2021-05-17 DIAGNOSIS — K658 Other peritonitis: Secondary | ICD-10-CM | POA: Diagnosis present

## 2021-05-17 DIAGNOSIS — Z8719 Personal history of other diseases of the digestive system: Secondary | ICD-10-CM

## 2021-05-17 DIAGNOSIS — N390 Urinary tract infection, site not specified: Secondary | ICD-10-CM | POA: Diagnosis present

## 2021-05-17 DIAGNOSIS — Z9889 Other specified postprocedural states: Secondary | ICD-10-CM | POA: Diagnosis not present

## 2021-05-17 DIAGNOSIS — E876 Hypokalemia: Secondary | ICD-10-CM | POA: Diagnosis present

## 2021-05-17 DIAGNOSIS — Z6837 Body mass index (BMI) 37.0-37.9, adult: Secondary | ICD-10-CM | POA: Diagnosis not present

## 2021-05-17 DIAGNOSIS — Z882 Allergy status to sulfonamides status: Secondary | ICD-10-CM | POA: Diagnosis not present

## 2021-05-17 DIAGNOSIS — K56609 Unspecified intestinal obstruction, unspecified as to partial versus complete obstruction: Secondary | ICD-10-CM

## 2021-05-17 DIAGNOSIS — I110 Hypertensive heart disease with heart failure: Secondary | ICD-10-CM | POA: Diagnosis present

## 2021-05-17 DIAGNOSIS — R1084 Generalized abdominal pain: Secondary | ICD-10-CM | POA: Diagnosis not present

## 2021-05-17 DIAGNOSIS — R933 Abnormal findings on diagnostic imaging of other parts of digestive tract: Secondary | ICD-10-CM | POA: Diagnosis not present

## 2021-05-17 DIAGNOSIS — K56699 Other intestinal obstruction unspecified as to partial versus complete obstruction: Secondary | ICD-10-CM | POA: Diagnosis not present

## 2021-05-17 DIAGNOSIS — N170 Acute kidney failure with tubular necrosis: Secondary | ICD-10-CM | POA: Diagnosis not present

## 2021-05-17 DIAGNOSIS — K631 Perforation of intestine (nontraumatic): Secondary | ICD-10-CM | POA: Diagnosis not present

## 2021-05-17 DIAGNOSIS — K3532 Acute appendicitis with perforation and localized peritonitis, without abscess: Secondary | ICD-10-CM | POA: Diagnosis not present

## 2021-05-17 DIAGNOSIS — R0600 Dyspnea, unspecified: Secondary | ICD-10-CM

## 2021-05-17 DIAGNOSIS — I5032 Chronic diastolic (congestive) heart failure: Secondary | ICD-10-CM | POA: Diagnosis present

## 2021-05-17 DIAGNOSIS — Z91018 Allergy to other foods: Secondary | ICD-10-CM

## 2021-05-17 DIAGNOSIS — E871 Hypo-osmolality and hyponatremia: Secondary | ICD-10-CM | POA: Diagnosis not present

## 2021-05-17 DIAGNOSIS — E875 Hyperkalemia: Secondary | ICD-10-CM | POA: Diagnosis not present

## 2021-05-17 DIAGNOSIS — K5700 Diverticulitis of small intestine with perforation and abscess without bleeding: Secondary | ICD-10-CM | POA: Diagnosis present

## 2021-05-17 DIAGNOSIS — R110 Nausea: Secondary | ICD-10-CM

## 2021-05-17 DIAGNOSIS — Z20822 Contact with and (suspected) exposure to covid-19: Secondary | ICD-10-CM | POA: Diagnosis present

## 2021-05-17 DIAGNOSIS — R188 Other ascites: Secondary | ICD-10-CM | POA: Diagnosis present

## 2021-05-17 DIAGNOSIS — I255 Ischemic cardiomyopathy: Secondary | ICD-10-CM | POA: Diagnosis present

## 2021-05-17 DIAGNOSIS — Z800 Family history of malignant neoplasm of digestive organs: Secondary | ICD-10-CM

## 2021-05-17 DIAGNOSIS — B9620 Unspecified Escherichia coli [E. coli] as the cause of diseases classified elsewhere: Secondary | ICD-10-CM | POA: Diagnosis present

## 2021-05-17 DIAGNOSIS — R109 Unspecified abdominal pain: Secondary | ICD-10-CM | POA: Diagnosis present

## 2021-05-17 DIAGNOSIS — K5790 Diverticulosis of intestine, part unspecified, without perforation or abscess without bleeding: Secondary | ICD-10-CM | POA: Diagnosis not present

## 2021-05-17 DIAGNOSIS — Z955 Presence of coronary angioplasty implant and graft: Secondary | ICD-10-CM

## 2021-05-17 DIAGNOSIS — R935 Abnormal findings on diagnostic imaging of other abdominal regions, including retroperitoneum: Secondary | ICD-10-CM | POA: Diagnosis not present

## 2021-05-17 LAB — URINALYSIS, ROUTINE W REFLEX MICROSCOPIC
Bilirubin Urine: NEGATIVE
Glucose, UA: NEGATIVE mg/dL
Ketones, ur: 5 mg/dL — AB
Leukocytes,Ua: NEGATIVE
Nitrite: NEGATIVE
Protein, ur: 30 mg/dL — AB
Specific Gravity, Urine: 1.03 (ref 1.005–1.030)
pH: 5 (ref 5.0–8.0)

## 2021-05-17 LAB — CBC WITH DIFFERENTIAL/PLATELET
Abs Immature Granulocytes: 0.04 10*3/uL (ref 0.00–0.07)
Basophils Absolute: 0 10*3/uL (ref 0.0–0.1)
Basophils Relative: 0 %
Eosinophils Absolute: 0.1 10*3/uL (ref 0.0–0.5)
Eosinophils Relative: 1 %
HCT: 45.5 % (ref 36.0–46.0)
Hemoglobin: 15.8 g/dL — ABNORMAL HIGH (ref 12.0–15.0)
Immature Granulocytes: 0 %
Lymphocytes Relative: 15 %
Lymphs Abs: 2 10*3/uL (ref 0.7–4.0)
MCH: 32.2 pg (ref 26.0–34.0)
MCHC: 34.7 g/dL (ref 30.0–36.0)
MCV: 92.7 fL (ref 80.0–100.0)
Monocytes Absolute: 1.1 10*3/uL — ABNORMAL HIGH (ref 0.1–1.0)
Monocytes Relative: 8 %
Neutro Abs: 10.6 10*3/uL — ABNORMAL HIGH (ref 1.7–7.7)
Neutrophils Relative %: 76 %
Platelets: 395 10*3/uL (ref 150–400)
RBC: 4.91 MIL/uL (ref 3.87–5.11)
RDW: 13.1 % (ref 11.5–15.5)
WBC: 13.8 10*3/uL — ABNORMAL HIGH (ref 4.0–10.5)
nRBC: 0 % (ref 0.0–0.2)

## 2021-05-17 LAB — COMPREHENSIVE METABOLIC PANEL
ALT: 25 U/L (ref 0–44)
AST: 25 U/L (ref 15–41)
Albumin: 4.2 g/dL (ref 3.5–5.0)
Alkaline Phosphatase: 63 U/L (ref 38–126)
Anion gap: 10 (ref 5–15)
BUN: 8 mg/dL (ref 8–23)
CO2: 22 mmol/L (ref 22–32)
Calcium: 9.4 mg/dL (ref 8.9–10.3)
Chloride: 100 mmol/L (ref 98–111)
Creatinine, Ser: 0.84 mg/dL (ref 0.44–1.00)
GFR, Estimated: >60 mL/min (ref >60)
Glucose, Bld: 138 mg/dL — ABNORMAL HIGH (ref 70–99)
Potassium: 2.9 mmol/L — ABNORMAL LOW (ref 3.5–5.1)
Sodium: 132 mmol/L — ABNORMAL LOW (ref 135–145)
Total Bilirubin: 0.5 mg/dL (ref 0.3–1.2)
Total Protein: 7.8 g/dL (ref 6.5–8.1)

## 2021-05-17 LAB — URINALYSIS, MICROSCOPIC (REFLEX)

## 2021-05-17 LAB — RESP PANEL BY RT-PCR (FLU A&B, COVID) ARPGX2
Influenza A by PCR: NEGATIVE
Influenza B by PCR: NEGATIVE
SARS Coronavirus 2 by RT PCR: NEGATIVE

## 2021-05-17 LAB — LIPASE, BLOOD: Lipase: 31 U/L (ref 11–51)

## 2021-05-17 LAB — POC OCCULT BLOOD, ED: Fecal Occult Bld: NEGATIVE

## 2021-05-17 MED ORDER — SODIUM CHLORIDE 0.9 % IV BOLUS
500.0000 mL | Freq: Once | INTRAVENOUS | Status: AC
  Administered 2021-05-17: 500 mL via INTRAVENOUS

## 2021-05-17 MED ORDER — IOHEXOL 300 MG/ML  SOLN
100.0000 mL | Freq: Once | INTRAMUSCULAR | Status: AC | PRN
  Administered 2021-05-17: 100 mL via INTRAVENOUS

## 2021-05-17 MED ORDER — SODIUM CHLORIDE 0.9 % IV SOLN: INTRAVENOUS | Status: DC

## 2021-05-17 MED ORDER — CIPROFLOXACIN IN D5W 400 MG/200ML IV SOLN
400.0000 mg | Freq: Once | INTRAVENOUS | Status: AC
  Administered 2021-05-17: 400 mg via INTRAVENOUS
  Filled 2021-05-17: qty 200

## 2021-05-17 MED ORDER — POTASSIUM CHLORIDE CRYS ER 20 MEQ PO TBCR
40.0000 meq | EXTENDED_RELEASE_TABLET | Freq: Once | ORAL | Status: AC
  Administered 2021-05-17: 40 meq via ORAL
  Filled 2021-05-17: qty 2

## 2021-05-17 MED ORDER — METRONIDAZOLE 500 MG/100ML IV SOLN
500.0000 mg | Freq: Once | INTRAVENOUS | Status: AC
  Administered 2021-05-17: 500 mg via INTRAVENOUS
  Filled 2021-05-17: qty 100

## 2021-05-17 NOTE — Assessment & Plan Note (Signed)
Admit to med/surg bed. IVF. NPO. Continue Cipro/Flagyl. [facility] consulted by EDP.

## 2021-05-17 NOTE — Subjective & Objective (Signed)
CC: abd pain, bloating HPI: 71 year old female with a history of coronary disease status post NSTEMI, history of combined systolic/diastolic failure, reflux, hyperlipidemia, OSA presents the ER today from the GI office.  Patient's been seen multiple times by GI for abdominal pain, bloating.  This was presumed to be from a diverticulitis flare.  Patient initially started having symptoms about 10 days ago.  She was initially on Augmentin for a UTI.  Patient started having similar symptoms.  They placed her on some Bentyl and Align.  Patient's nausea and abdominal pain continued to worsen.  She was seen yesterday by GI.  She had a abdominal x-ray along with a CT scan.  This demonstrated a possible colonic obstruction.  She was sent to the ER today for evaluation.  Patient denies any vomiting this week.  She states that she has been having diarrheal stools for several days now.  She has not had a solid bowel movement in over a week.  She states she is passing very little flatus over the last 2 to 3 days.  There have been no ill contacts.  She states that she hears her bowels moving because they are making very loud noises.  She has also noticed increased abdominal bloating.  On admission the ER, temp 98.3, heart rate 73, blood pressure 129/72.  Labs.  Hemoccult was negative.  Sodium 132 potassium 2.9 chloride 100 bicarb 22 BUN of 8 creatinine 0.84  COVID and flu are both negative.  White count 13.8, hemoglobin 15.8, platelets 395.  Repeat KUB today continue to demonstrate colonic distention and gas air-fluid levels.  Due to patient's colonic obstruction, [facility] hospitalist contacted for admission.  EDP has already contacted [facility] who will see the patient in the morning.

## 2021-05-17 NOTE — Telephone Encounter (Signed)
 [facility]  Telephone call  05/17/2021 12:00 PM  Called to speak with the patient about her recent x-ray and CT results.  These show possible distal obstruction.  Discussed with Dr. Rush Landmark who is our supervising physician today as her physician Dr. Fuller Plan is out of the clinic.  Recommended that the patient either go to the hospital given her degree of uncomfortableness or that we could trial some MiraLAX 2-3 doses today and continued over the weekend daily 2-3 doses to try and get things to move if she is tolerating.  Would also recommend starting Cipro 500 twice daily and Flagyl 500 3 times daily to cover for any possible inflammatory/infectious cause given that there was some inflammation in her ascending colon.  Also recommend she stay on a clear liquid diet over the weekend, but if it any point symptoms worsen then recommend she go to the ER for admission.  Explained that if she ends up in the hospital they will call our service and she may benefit from colonoscopic decompression and possible NG tube.  At time of our call patient preferred to try to stay at home.  Ellouise Newer, PA-C  Addendum:  1:00 PM patient called in, unfortunately I missed her call 1:30 PM patient had called and had missed her call.  Called her back and she told me she would prefer to just go to the ER because she is uncomfortable and been dealing of this too long and wants to feel better.  Told her I would go ahead and call the ER and let them know she is coming and also let her on-call service know to expect her.  Ellouise Newer, PA-C

## 2021-05-17 NOTE — Assessment & Plan Note (Signed)
Stable

## 2021-05-17 NOTE — ED Provider Notes (Signed)
[facility] DEPT Provider Note   CSN: [id number] Arrival date & time: 05/17/21  1504     History  Chief Complaint  Patient presents with   abnormal CT scan    Valerie Fields is a 71 y.o. female.  Patient followed by [facility] gastroenterology.  Patient with a history of recurrent diverticulitis.  And colonic inflammation.  Patient has been struggling with some diarrhea on and off since abdominal discomfort since of February 6.  Patient seen yesterday by PA with [facility] in the office.  They obtained an abdominal series film that did not show distinctly bowel obstruction.  But raise more concerns may be for an ileus.  Or gastroenteritis.  They arranged for a CT scan of the abdomen today with IV and oral contrast.  That had significant findings with some inflammation ascending colon.  Also showed evidence of concern for a rectosigmoid bursal obstruction.  There were air fluid levels as well.  Based on this the PA that was seeing her had considered just treating with oral Flagyl and Cipro and see how she was doing.  The patient was uncomfortable after drinking the oral contrast so came in for evaluation.  There has been no vomiting.  Patient's does bowel movements are somewhat liquidy.  And earlier in the month she was on Augmentin.  Past medical history significant for the diverticulitis gastroesophageal reflux disease coronary artery disease combined systolic diastolic congestive heart failure non-STEMI in 2021.  Irritable colon syndrome.  No abdominal surgeries.      Home Medications Prior to Admission medications   Medication Sig Start Date End Date Taking? Authorizing Provider  Acetaminophen (TYLENOL ARTHRITIS PAIN PO) Take by mouth as needed.    [provider]  atorvastatin (LIPITOR) 80 MG tablet TAKE ONE TABLET BY MOUTH EVERYDAY AT BEDTIME 04/19/21   Burnell Blanks, MD  azelastine (ASTELIN) 0.1 % nasal spray Place 1  spray into both nostrils daily. Use in each nostril as directed    [provider]  Chlorpheniramine Maleate (ALLERGY RELIEF PO) Take by mouth daily.     [provider]  clopidogrel (PLAVIX) 75 MG tablet Take 1 tablet (75 mg total) by mouth daily. 08/13/20   Burnell Blanks, MD  dicyclomine (BENTYL) 10 MG capsule Take 1 capsule (10 mg total) by mouth 4 (four) times daily -  before meals and at bedtime. 05/06/21   Levin Erp, PA  diphenhydrAMINE (SLEEP AID, DIPHENHYDRAMINE,) 25 MG tablet Take 25 mg by mouth at bedtime as needed for sleep.    [provider]  docusate sodium (COLACE) 100 MG capsule Take 100 mg by mouth daily.     [provider]  losartan (COZAAR) 25 MG tablet TAKE ONE TABLET BY MOUTH EVERY MORNING 04/19/21   Burnell Blanks, MD  metoprolol succinate (TOPROL-XL) 25 MG 24 hr tablet TAKE ONE TABLET BY MOUTH EVERY MORNING 04/19/21   Burnell Blanks, MD  metroNIDAZOLE (METROGEL) 0.75 % gel SMARTSIG:Sparingly Topical 1 to 2 Times Daily 05/23/20   [provider]  montelukast (SINGULAIR) 10 MG tablet Take 10 mg by mouth at bedtime.  08/20/15   [provider]  nitroGLYCERIN (NITROSTAT) 0.4 MG SL tablet Place 1 tablet (0.4 mg total) under the tongue every 5 (five) minutes as needed for chest pain. 10/29/20 10/29/21  Burnell Blanks, MD  pantoprazole (PROTONIX) 40 MG tablet Take 1 tablet (40 mg total) by mouth 2 (two) times daily before a meal. 02/16/20  Levin Erp, [place]  psyllium (REGULOID) 0.52 g capsule Take 1.5 g by mouth daily.    [provider]      Allergies    Soy allergy and Sulfa antibiotics    Review of Systems   Review of Systems  Constitutional:  Negative for chills and fever.  HENT:  Negative for ear pain and sore throat.   Eyes:  Negative for pain and visual disturbance.  Respiratory:  Negative for cough and shortness of breath.   Cardiovascular:  Negative for  chest pain and palpitations.  Gastrointestinal:  Positive for abdominal distention, abdominal pain, diarrhea and nausea. Negative for vomiting.  Genitourinary:  Negative for dysuria and hematuria.  Musculoskeletal:  Negative for arthralgias and back pain.  Skin:  Negative for color change and rash.  Neurological:  Negative for seizures and syncope.  All other systems reviewed and are negative.  Physical Exam Updated Vital Signs BP (!) 149/81 (BP Location: Left Arm)    Pulse 76    Temp (!) 97.5 F (36.4 C) (Oral)    Resp 16    Ht 1.575 m (5\' 2" )    Wt 93 kg    SpO2 100%    BMI 37.49 kg/m  Physical Exam Vitals and nursing note reviewed.  Constitutional:      General: She is not in acute distress.    Appearance: Normal appearance. She is well-developed.  HENT:     Head: Normocephalic and atraumatic.  Eyes:     Extraocular Movements: Extraocular movements intact.     Conjunctiva/sclera: Conjunctivae normal.     Pupils: Pupils are equal, round, and reactive to light.  Cardiovascular:     Rate and Rhythm: Normal rate and regular rhythm.     Heart sounds: No murmur heard. Pulmonary:     Effort: Pulmonary effort is normal. No respiratory distress.     Breath sounds: Normal breath sounds.  Abdominal:     General: There is no distension.     Palpations: Abdomen is soft.     Tenderness: There is no abdominal tenderness.  Musculoskeletal:        General: No swelling.     Cervical back: Normal range of motion and neck supple.  Skin:    General: Skin is warm and dry.     Capillary Refill: Capillary refill takes less than 2 seconds.  Neurological:     General: No focal deficit present.     Mental Status: She is alert and oriented to person, place, and time.  Psychiatric:        Mood and Affect: Mood normal.    ED Results / Procedures / Treatments   Labs (all labs ordered are listed, but only abnormal results are displayed) Labs Reviewed  CBC WITH DIFFERENTIAL/PLATELET - Abnormal;  Notable for the following components:      Result Value   WBC 13.8 (*)    Hemoglobin 15.8 (*)    Neutro Abs 10.6 (*)    Monocytes Absolute 1.1 (*)    All other components within normal limits  COMPREHENSIVE METABOLIC PANEL - Abnormal; Notable for the following components:   Sodium 132 (*)    Potassium 2.9 (*)    Glucose, Bld 138 (*)    All other components within normal limits  URINALYSIS, ROUTINE W REFLEX MICROSCOPIC - Abnormal; Notable for the following components:   APPearance CLOUDY (*)    Hgb urine dipstick MODERATE (*)    Ketones, ur 5 (*)    Protein,  ur 30 (*)    All other components within normal limits  URINALYSIS, MICROSCOPIC (REFLEX) - Abnormal; Notable for the following components:   Bacteria, UA MANY (*)    All other components within normal limits  RESP PANEL BY RT-PCR (FLU A&B, COVID) ARPGX2  C DIFFICILE QUICK SCREEN W PCR REFLEX    LIPASE, BLOOD    EKG None  Radiology CT Abdomen Pelvis W Contrast  Result Date: 05/17/2021 CLINICAL DATA:  Bowel obstruction suspected Diverticulitis, complication suspected EXAM: CT ABDOMEN AND PELVIS WITH CONTRAST TECHNIQUE: Multidetector CT imaging of the abdomen and pelvis was performed using the standard protocol following bolus administration of intravenous contrast. RADIATION DOSE REDUCTION: This exam was performed according to the departmental dose-optimization program which includes automated exposure control, adjustment of the mA and/or kV according to patient size and/or use of iterative reconstruction technique. CONTRAST:  152mL OMNIPAQUE IOHEXOL 300 MG/ML  SOLN COMPARISON:  Radiograph 05/16/2021 FINDINGS: Lower chest: LAD stent or calcifications. Mitral annular calcifications. Apical and apical septal endocardial hypoattenuation compatible with prior infarct. No acute abnormality. Hepatobiliary: No focal liver abnormality is seen. The gallbladder is unremarkable. Pancreas: Unremarkable. No pancreatic ductal dilatation or  surrounding inflammatory changes. Spleen: Normal in size without focal abnormality. Adrenals/Urinary Tract: There is a 2.5 cm left adrenal nodule, previously measuring 2.3 cm (series 2, image 28). On previous exam in 2020, this measured less than 10 Hounsfield units, consistent with adenoma (this was a noncontrast exam). No hydronephrosis or nephrolithiasis. Left renal sinus cysts. The bladder is decompressed. Stomach/Bowel: There is near diffuse fluid-filled dilatation of the colon with air-fluid levels. There are scattered diverticula without definite diverticulitis. There is some inflammatory stranding along the ascending colon. Decompression at the rectosigmoid junction (series 2, image 78).The appendix is fluid-filled without any adjacent inflammatory stranding. No evidence of appendiceal obstruction/appendicitis. Vascular/Lymphatic: No significant vascular findings are present. No enlarged abdominal or pelvic lymph nodes. Reproductive: Unremarkable. Other: Small volume free fluid in the pelvis, likely reactive. No hernia. Musculoskeletal: No acute osseous abnormality. No suspicious lytic or blastic lesions. Multilevel degenerative changes of the spine with trace retrolisthesis at L5-S1. IMPRESSION: Near diffuse fluid-filled dilatation of the colon with air-fluid levels and mild inflammatory stranding along the ascending colon. Findings are consistent severe diarrheal illness with reactive stranding along the ascending colon/developing colitis. There is a transition to decompressed colon at the rectosigmoid junction, distal obstruction cannot be excluded. 2.5 cm left adrenal nodule, minimally increased in size since prior exam in 2020 (previously measured 2.3 cm), imaging characteristics most consistent with adenoma. Recommend biochemical testing to assess for functionality. Electronically Signed   By: Maurine Simmering M.D.   On: 05/17/2021 11:35   DG Abd 2 Views  Result Date: 05/16/2021 CLINICAL DATA:   Abdominal pain. Gas. Bloating. Nausea and diarrhea for 10 days. EXAM: ABDOMEN - 2 VIEW COMPARISON:  02/03/2019 CT FINDINGS: Upright view of the abdomen and upper pelvis. Supine views of the abdomen and pelvis. The upright view demonstrates no free intraperitoneal air. Fluid levels throughout the colon. Small bowel air-fluid levels are also suspected within the right mid and lower abdomen. Supine images demonstrate mild gaseous distention of the transverse colon at up to 8.9 cm. No gaseous distention of small bowel loops. Paucity of distal gas. Presumed phleboliths in the left hemipelvis. No abnormal abdominal calcifications. Minimal convex right lumbar spine curvature. IMPRESSION: Mild gaseous distension of the colon with colonic and less so small-bowel air-fluid levels. Nonspecific, possibly related to adynamic ileus in the setting of  diarrhea and viral gastroenteritis. Distal obstruction could look similar. Electronically Signed   By: Abigail Miyamoto M.D.   On: 05/16/2021 15:27   DG Abd Acute W/Chest  Result Date: 05/17/2021 CLINICAL DATA:  Abdominal pain, nausea EXAM: DG ABDOMEN ACUTE WITH 1 VIEW CHEST COMPARISON:  05/17/2021 FINDINGS: Supine and upright frontal views of the abdomen as well as an upright frontal view of the chest are obtained. The cardiac silhouette is unremarkable. The lungs are clear. There is diffuse gaseous distention of the colon with multiple gas fluid levels, unchanged since earlier CT. Please refer to preceding CT evaluation describing wall thickening at the rectosigmoid junction with differential of colitis, scarring, or mass. No free gas in the greater peritoneal sac. No masses or abnormal calcifications. IMPRESSION: 1. Stable marked colonic distension and gas fluid levels. Please refer to preceding CT describing wall thickening and transition point at the rectosigmoid junction. 2. No acute intrathoracic process. Electronically Signed   By: Randa Ngo M.D.   On: 05/17/2021 21:35     Procedures Procedures    Medications Ordered in ED Medications  0.9 %  sodium chloride infusion (has no administration in time range)  sodium chloride 0.9 % bolus 500 mL (has no administration in time range)  potassium chloride SA (KLOR-CON M) CR tablet 40 mEq (has no administration in time range)  ciprofloxacin (CIPRO) IVPB 400 mg (has no administration in time range)  metroNIDAZOLE (FLAGYL) IVPB 500 mg (has no administration in time range)    ED Course/ Medical Decision Making/ A&P                           Medical Decision Making Amount and/or Complexity of Data Reviewed Radiology: ordered.  Risk Prescription drug management. Decision regarding hospitalization.   Patient's labs significant for white blood cell count of 13,000.  Complete metabolic panel significant for potassium of 2.9.  And a sodium of 132.  Renal function normal.  Liver function test normal.  Lipase normal.  Urinalysis without evidence of urinary tract infection.  COVID flu testing negative.  There was many bacteria on the urinalysis so we will send for culture.  But the white blood cell count was 0-5.  The acute abdominal series repeated here tonight showed marked colonic distention and gas fluid levels.  Similar to the CT scan that was done earlier today.  There is thickening and transition point at the rectosigmoid junction.  Chest without any acute findings.  Discussed with Dr. Henrene Pastor covering for [facility] gastroenterology.  He is in agreement with bowel rest IV fluids potassium correction C. difficile Flagyl and Cipro IV antibiotics and hospitalist admission.  And they will consult.  Patient given 40 mill colons potassium p.o.  If she does not tolerate that then we will have to give IV.  Patient's abdomen soft nontender.  Patient in no acute distress.   Final Clinical Impression(s) / ED Diagnoses Final diagnoses:  Colonic obstruction ([place])  Colitis  Hypokalemia    Rx / DC Orders ED Discharge  Orders     None         Fredia Sorrow, MD 05/17/21 2224

## 2021-05-17 NOTE — ED Provider Triage Note (Signed)
Emergency Medicine Provider Triage Evaluation Note  Valerie Fields , a 71 y.o. female  was evaluated in triage.  Pt complains of abnormal CT abdomen as noted below. Patient notes after drinking the contrast today she has had increased abdominal distention. Admits to loose stool earlier today. No vomiting.  CT abdomen demonstrates: IMPRESSION: Near diffuse fluid-filled dilatation of the colon with air-fluid levels and mild inflammatory stranding along the ascending colon. Findings are consistent severe diarrheal illness with reactive stranding along the ascending colon/developing colitis. There is a transition to decompressed colon at the rectosigmoid junction, distal obstruction cannot be excluded.   2.5 cm left adrenal nodule, minimally increased in size since prior exam in 2020 (previously measured 2.3 cm), imaging characteristics most consistent with adenoma. Recommend biochemical testing to assess for functionality.  GI recommends "the patient either go to the hospital given her degree of uncomfortableness or that we could trial some MiraLAX 2-3 doses today and continued over the weekend daily 2-3 doses to try and get things to move if she is tolerating.  Would also recommend starting Cipro 500 twice daily and Flagyl 500 3 times daily to cover for any possible inflammatory/infectious cause given that there was some inflammation in her ascending colon.  Also recommend she stay on a clear liquid diet over the weekend, but if it any point symptoms worsen then recommend she go to the ER for admission.  Explained that if she ends up in the hospital they will call our service and she may benefit from colonoscopic decompression and possible NG tube"    Review of Systems  Positive: Abdominal pain Negative: fever  Physical Exam  BP 129/72 (BP Location: Left Arm)    Pulse 77    Temp 98.3 F (36.8 C) (Oral)    Resp 18    Ht 5\' 2"  (1.575 m)    Wt 93 kg    SpO2 96%    BMI 37.49 kg/m  Gen:    Awake, no distress   Resp:  Normal effort  MSK:   Moves extremities without difficulty  Other:  Diffuse tenderness  Medical Decision Making  Medically screening exam initiated at 4:09 PM.  Appropriate orders placed.  Sreeja Spies was informed that the remainder of the evaluation will be completed by another provider, this initial triage assessment does not replace that evaluation, and the importance of remaining in the ED until their evaluation is complete.  Abdominal labs COVID for possible admission   Karie Kirks 05/17/21 1859

## 2021-05-17 NOTE — Assessment & Plan Note (Signed)
-   Continue home meds °

## 2021-05-17 NOTE — Assessment & Plan Note (Addendum)
Replete with oral potassium.  Likely due to her diarrhea she has had this week.

## 2021-05-17 NOTE — Assessment & Plan Note (Signed)
On plavix at home.. Will hold in case pt need procedure/surgery.

## 2021-05-17 NOTE — Telephone Encounter (Signed)
Inbound call from patient to further discuss CT results

## 2021-05-17 NOTE — ED Triage Notes (Addendum)
Patient states she had a CT abdomen this AM and patient states she was told to come to the ED for further evaluation. Patient states she has a stricture and inflammation present in the colon.  Patient c/o abdominal pain, nausea, and bloating since 05/06/21

## 2021-05-17 NOTE — H&P (Signed)
History and Physical    Valerie Fields SPQ:[id number] DOB: 05/06/1950 DOA: 05/17/2021  DOS: the patient was seen and examined on 05/17/2021  PCP: Marda Stalker, PA-C   Patient coming from: Home  I have personally briefly reviewed patient's old medical records in [place]  CC: abd pain, bloating HPI: 71 year old female with a history of coronary disease status post NSTEMI, history of combined systolic/diastolic failure, reflux, hyperlipidemia, OSA presents the ER today from the GI office.  Patient's been seen multiple times by GI for abdominal pain, bloating.  This was presumed to be from a diverticulitis flare.  Patient initially started having symptoms about 10 days ago.  She was initially on Augmentin for a UTI.  Patient started having similar symptoms.  They placed her on some Bentyl and Align.  Patient's nausea and abdominal pain continued to worsen.  She was seen yesterday by GI.  She had a abdominal x-ray along with a CT scan.  This demonstrated a possible colonic obstruction.  She was sent to the ER today for evaluation.  Patient denies any vomiting this week.  She states that she has been having diarrheal stools for several days now.  She has not had a solid bowel movement in over a week.  She states she is passing very little flatus over the last 2 to 3 days.  There have been no ill contacts.  She states that she hears her bowels moving because they are making very loud noises.  She has also noticed increased abdominal bloating.  On admission the ER, temp 98.3, heart rate 73, blood pressure 129/72.  Labs.  Hemoccult was negative.  Sodium 132 potassium 2.9 chloride 100 bicarb 22 BUN of 8 creatinine 0.84  COVID and flu are both negative.  White count 13.8, hemoglobin 15.8, platelets 395.  Repeat KUB today continue to demonstrate colonic distention and gas air-fluid levels.  Due to patient's colonic obstruction, [facility] contacted for  admission.  EDP has already contacted [facility] who will see the patient in the morning.   ED Course: kub shows continued colonic obstruction. Hemoccult negative. Low potasssim  Review of Systems:  Review of Systems  Constitutional:  Negative for chills and fever.  HENT: Negative.    Eyes: Negative.   Respiratory: Negative.    Cardiovascular: Negative.   Gastrointestinal:  Positive for abdominal pain, diarrhea and nausea. Negative for vomiting.       Loud gurgling bowel noises  Genitourinary: Negative.   Musculoskeletal: Negative.   Skin: Negative.   Neurological: Negative.   Endo/Heme/Allergies: Negative.        +anorexia  Psychiatric/Behavioral: Negative.    All other systems reviewed and are negative.  Past Medical History:  Diagnosis Date   Allergy    Asthma    Chronic combined systolic and diastolic CHF 33/35/4562   Ischemic CM // Echocardiogram 7/21: EF 40-45, Gr 2 DD, mid to apical ant/ant-sept AK, RVSP 23.4, trivial MR   Coronary artery disease    NSTEMI 7/21: oLAD 30, pLAD 99 >> PCI: DES; D1 90 >> PCI: POBA, prox and mid RCA 15, EF 35   Diverticulitis    Gallbladder problem    GERD (gastroesophageal reflux disease)    History of heart attack    Hyperlipidemia 10/15/2019   Zetia DC'd during admit for NSTEMI 7/21 >> ? to Atorvastatin   IBS (irritable colon syndrome)    Ischemic cardiomyopathy 10/15/2019   Joint pain    Lactose intolerance    Multiple  food allergies    Non-ST elevation (NSTEMI) MI 10/13/2019   PCI:  DES to LAD and POBA to D1    Past Surgical History:  Procedure Laterality Date   BREAST BIOPSY Left    No Scar seen    BREAST CYST EXCISION Left    No scar seen    CARDIAC CATHETERIZATION     COLONOSCOPY     CORONARY STENT INTERVENTION  10/13/2019   CORONARY STENT INTERVENTION N/A 10/13/2019   Procedure: CORONARY STENT INTERVENTION;  Surgeon: Nelva Bush, MD;  Location: [facility];  Service: Cardiovascular;  Laterality: N/A;  mid  lad   LEFT HEART CATH AND CORONARY ANGIOGRAPHY N/A 10/13/2019   Procedure: LEFT HEART CATH AND CORONARY ANGIOGRAPHY;  Surgeon: Nelva Bush, MD;  Location: [facility];  Service: Cardiovascular;  Laterality: N/A;   TONSILLECTOMY       reports that she has never smoked. She has never used smokeless tobacco. She reports current alcohol use of about 1.0 standard drink per week. She reports that she does not use drugs.  Allergies  Allergen Reactions   Soy Allergy Hives   Sulfa Antibiotics Other (See Comments)    As a child, turned red above neck    Family History  Problem Relation Age of Onset   GER disease Father    Aneurysm Mother    Hyperlipidemia Mother    Obesity Mother    Stomach cancer Maternal Grandfather    Colon cancer Neg Hx    Esophageal cancer Neg Hx    Pancreatic cancer Neg Hx    Rectal cancer Neg Hx    Breast cancer Neg Hx     Prior to Admission medications   Medication Sig Start Date End Date Taking? Authorizing Provider  Acetaminophen (TYLENOL ARTHRITIS PAIN PO) Take by mouth as needed.    [provider]  atorvastatin (LIPITOR) 80 MG tablet TAKE ONE TABLET BY MOUTH EVERYDAY AT BEDTIME 04/19/21   Burnell Blanks, MD  azelastine (ASTELIN) 0.1 % nasal spray Place 1 spray into both nostrils daily. Use in each nostril as directed    [provider]  Chlorpheniramine Maleate (ALLERGY RELIEF PO) Take by mouth daily.     [provider]  clopidogrel (PLAVIX) 75 MG tablet Take 1 tablet (75 mg total) by mouth daily. 08/13/20   Burnell Blanks, MD  dicyclomine (BENTYL) 10 MG capsule Take 1 capsule (10 mg total) by mouth 4 (four) times daily -  before meals and at bedtime. 05/06/21   Levin Erp, PA  diphenhydrAMINE (SLEEP AID, DIPHENHYDRAMINE,) 25 MG tablet Take 25 mg by mouth at bedtime as needed for sleep.    [provider]  docusate sodium (COLACE) 100 MG capsule Take 100 mg by mouth daily.     [provider]  losartan (COZAAR) 25 MG tablet TAKE ONE TABLET BY MOUTH EVERY MORNING 04/19/21   Burnell Blanks, MD  metoprolol succinate (TOPROL-XL) 25 MG 24 hr tablet TAKE ONE TABLET BY MOUTH EVERY MORNING 04/19/21   Burnell Blanks, MD  metroNIDAZOLE (METROGEL) 0.75 % gel SMARTSIG:Sparingly Topical 1 to 2 Times Daily 05/23/20   [provider]  montelukast (SINGULAIR) 10 MG tablet Take 10 mg by mouth at bedtime.  08/20/15   [provider]  nitroGLYCERIN (NITROSTAT) 0.4 MG SL tablet Place 1 tablet (0.4 mg total) under the tongue every 5 (five) minutes as needed for chest pain. 10/29/20 10/29/21  Burnell Blanks, MD  pantoprazole (PROTONIX) 40 MG tablet Take 1 tablet (40 mg total) by mouth 2 (two) times daily before a meal. 02/16/20   Lemmon, Lavone Nian, PA  psyllium (REGULOID) 0.52 g capsule Take 1.5 g by mouth daily.    [provider]    Physical Exam: Vitals:   05/17/21 1555 05/17/21 1556 05/17/21 2043 05/17/21 2230  BP: 129/72 129/72 (!) 149/81 139/66  Pulse: 73 77 76 70  Resp: 16 18 16 16   Temp: 98.3 F (36.8 C) 98.3 F (36.8 C) (!) 97.5 F (36.4 C)   TempSrc: Oral Oral Oral   SpO2: 95% 96% 100% 100%  Weight:  93 kg 93 kg   Height:  5\' 2"  (1.575 m) 5\' 2"  (1.575 m)     Physical Exam Vitals and nursing note reviewed.  Constitutional:      General: She is not in acute distress.    Appearance: Normal appearance. She is obese. She is not ill-appearing, toxic-appearing or diaphoretic.  HENT:     Head: Normocephalic and atraumatic.     Nose: Nose normal. No rhinorrhea.  Eyes:     General:        Right eye: No discharge.        Left eye: No discharge.  Cardiovascular:     Rate and Rhythm: Normal rate and regular rhythm.     Pulses: Normal pulses.  Pulmonary:     Effort: Pulmonary effort is normal. No respiratory distress.     Breath sounds: Normal breath sounds. No wheezing or rales.  Abdominal:     General: Abdomen is  protuberant. Bowel sounds are increased. There is distension.     Tenderness: There is no abdominal tenderness.     Comments: Loud [place] noises  Neurological:     Mental Status: She is alert.     Labs on Admission: I have personally reviewed following labs and imaging studies  CBC: Recent Labs  Lab 05/17/21 1632  WBC 13.8*  NEUTROABS 10.6*  HGB 15.8*  HCT 45.5  MCV 92.7  PLT 782   Basic Metabolic Panel: Recent Labs  Lab 05/17/21 1632  NA 132*  K 2.9*  CL 100  CO2 22  GLUCOSE 138*  BUN 8  CREATININE 0.84  CALCIUM 9.4   GFR: Estimated Creatinine Clearance: 67.2 mL/min (by C-G formula based on SCr of 0.84 mg/dL). Liver Function Tests: Recent Labs  Lab 05/17/21 1632  AST 25  ALT 25  ALKPHOS 63  BILITOT 0.5  PROT 7.8  ALBUMIN 4.2   Recent Labs  Lab 05/17/21 1632  LIPASE 31   No results for input(s): AMMONIA in the last 168 hours. Coagulation Profile: No results for input(s): INR, PROTIME in the last 168 hours. Cardiac Enzymes: No results for input(s): CKTOTAL, CKMB, CKMBINDEX, TROPONINI in the last 168 hours. BNP (last 3 results) No results for input(s): PROBNP in the last 8760 hours. HbA1C: No results for input(s): HGBA1C in the last 72 hours. CBG: No results for input(s): GLUCAP in the last 168 hours. Lipid Profile: No results for input(s): CHOL, HDL, LDLCALC, TRIG, CHOLHDL, LDLDIRECT in the last 72 hours. Thyroid Function Tests: No results for input(s): TSH, T4TOTAL, FREET4, T3FREE, THYROIDAB in the last 72 hours. Anemia Panel: No results for input(s): VITAMINB12, FOLATE, FERRITIN, TIBC, IRON, RETICCTPCT in the last 72 hours. Urine analysis:    Component Value Date/Time   COLORURINE YELLOW 05/17/2021 1632   APPEARANCEUR CLOUDY (A) 05/17/2021 1632   LABSPEC 1.030 05/17/2021 1632   PHURINE  5.0 05/17/2021 1632   GLUCOSEU NEGATIVE 05/17/2021 1632   HGBUR MODERATE (A) 05/17/2021 1632   BILIRUBINUR NEGATIVE 05/17/2021 1632   KETONESUR 5 (A)  05/17/2021 1632   PROTEINUR 30 (A) 05/17/2021 1632   NITRITE NEGATIVE 05/17/2021 1632   LEUKOCYTESUR NEGATIVE 05/17/2021 1632    Radiological Exams on Admission: I have personally reviewed images CT Abdomen Pelvis W Contrast  Result Date: 05/17/2021 CLINICAL DATA:  Bowel obstruction suspected Diverticulitis, complication suspected EXAM: CT ABDOMEN AND PELVIS WITH CONTRAST TECHNIQUE: Multidetector CT imaging of the abdomen and pelvis was performed using the standard protocol following bolus administration of intravenous contrast. RADIATION DOSE REDUCTION: This exam was performed according to the departmental dose-optimization program which includes automated exposure control, adjustment of the mA and/or kV according to patient size and/or use of iterative reconstruction technique. CONTRAST:  151mL OMNIPAQUE IOHEXOL 300 MG/ML  SOLN COMPARISON:  Radiograph 05/16/2021 FINDINGS: Lower chest: LAD stent or calcifications. Mitral annular calcifications. Apical and apical septal endocardial hypoattenuation compatible with prior infarct. No acute abnormality. Hepatobiliary: No focal liver abnormality is seen. The gallbladder is unremarkable. Pancreas: Unremarkable. No pancreatic ductal dilatation or surrounding inflammatory changes. Spleen: Normal in size without focal abnormality. Adrenals/Urinary Tract: There is a 2.5 cm left adrenal nodule, previously measuring 2.3 cm (series 2, image 28). On previous exam in 2020, this measured less than 10 Hounsfield units, consistent with adenoma (this was a noncontrast exam). No hydronephrosis or nephrolithiasis. Left renal sinus cysts. The bladder is decompressed. Stomach/Bowel: There is near diffuse fluid-filled dilatation of the colon with air-fluid levels. There are scattered diverticula without definite diverticulitis. There is some inflammatory stranding along the ascending colon. Decompression at the rectosigmoid junction (series 2, image 78).The appendix is  fluid-filled without any adjacent inflammatory stranding. No evidence of appendiceal obstruction/appendicitis. Vascular/Lymphatic: No significant vascular findings are present. No enlarged abdominal or pelvic lymph nodes. Reproductive: Unremarkable. Other: Small volume free fluid in the pelvis, likely reactive. No hernia. Musculoskeletal: No acute osseous abnormality. No suspicious lytic or blastic lesions. Multilevel degenerative changes of the spine with trace retrolisthesis at L5-S1. IMPRESSION: Near diffuse fluid-filled dilatation of the colon with air-fluid levels and mild inflammatory stranding along the ascending colon. Findings are consistent severe diarrheal illness with reactive stranding along the ascending colon/developing colitis. There is a transition to decompressed colon at the rectosigmoid junction, distal obstruction cannot be excluded. 2.5 cm left adrenal nodule, minimally increased in size since prior exam in 2020 (previously measured 2.3 cm), imaging characteristics most consistent with adenoma. Recommend biochemical testing to assess for functionality. Electronically Signed   By: Maurine Simmering M.D.   On: 05/17/2021 11:35   DG Abd 2 Views  Result Date: 05/16/2021 CLINICAL DATA:  Abdominal pain. Gas. Bloating. Nausea and diarrhea for 10 days. EXAM: ABDOMEN - 2 VIEW COMPARISON:  02/03/2019 CT FINDINGS: Upright view of the abdomen and upper pelvis. Supine views of the abdomen and pelvis. The upright view demonstrates no free intraperitoneal air. Fluid levels throughout the colon. Small bowel air-fluid levels are also suspected within the right mid and lower abdomen. Supine images demonstrate mild gaseous distention of the transverse colon at up to 8.9 cm. No gaseous distention of small bowel loops. Paucity of distal gas. Presumed phleboliths in the left hemipelvis. No abnormal abdominal calcifications. Minimal convex right lumbar spine curvature. IMPRESSION: Mild gaseous distension of the colon  with colonic and less so small-bowel air-fluid levels. Nonspecific, possibly related to adynamic ileus in the setting of diarrhea and viral gastroenteritis. Distal obstruction  could look similar. Electronically Signed   By: Abigail Miyamoto M.D.   On: 05/16/2021 15:27   DG Abd Acute W/Chest  Result Date: 05/17/2021 CLINICAL DATA:  Abdominal pain, nausea EXAM: DG ABDOMEN ACUTE WITH 1 VIEW CHEST COMPARISON:  05/17/2021 FINDINGS: Supine and upright frontal views of the abdomen as well as an upright frontal view of the chest are obtained. The cardiac silhouette is unremarkable. The lungs are clear. There is diffuse gaseous distention of the colon with multiple gas fluid levels, unchanged since earlier CT. Please refer to preceding CT evaluation describing wall thickening at the rectosigmoid junction with differential of colitis, scarring, or mass. No free gas in the greater peritoneal sac. No masses or abnormal calcifications. IMPRESSION: 1. Stable marked colonic distension and gas fluid levels. Please refer to preceding CT describing wall thickening and transition point at the rectosigmoid junction. 2. No acute intrathoracic process. Electronically Signed   By: Randa Ngo M.D.   On: 05/17/2021 21:35    EKG: I have personally reviewed EKG: no EKG available to review  Assessment/Plan Principal Problem:   Colonic obstruction (HCC) Active Problems:   Chronic combined systolic and diastolic CHF (congestive heart failure) (HCC)   Hyperlipidemia   GERD (gastroesophageal reflux disease)   OSA (obstructive sleep apnea)   Primary hypertension   CAD (coronary artery disease), native coronary artery   Hypokalemia    Assessment and Plan: * Colonic obstruction ([place])- (present on admission) Admit to med/surg bed. IVF. NPO. Continue Cipro/Flagyl. [facility] consulted by EDP.  Hypokalemia Replete with oral potassium.  Likely due to her diarrhea she has had this week.  CAD (coronary artery disease), native  coronary artery On plavix at home.. Will hold in case pt need procedure/surgery.  Primary hypertension- (present on admission) Continue home meds.  OSA (obstructive sleep apnea)- (present on admission) Stable.  GERD (gastroesophageal reflux disease)- (present on admission) Stable.  Hyperlipidemia- (present on admission) Stable.  Chronic combined systolic and diastolic CHF (congestive heart failure) ([place])- (present on admission) Normal EF.     DVT prophylaxis: SCDs Code Status: Full Code Family Communication: no family at bedside  Disposition Plan: return home  Consults called: EDP has contacted [facility]  Admission status: Inpatient, Med-Surg   Kristopher Oppenheim, DO [facility] 05/17/2021, 11:06 PM

## 2021-05-17 NOTE — Assessment & Plan Note (Signed)
Normal EF.

## 2021-05-18 ENCOUNTER — Inpatient Hospital Stay (HOSPITAL_COMMUNITY): Payer: Medicare HMO

## 2021-05-18 DIAGNOSIS — R935 Abnormal findings on diagnostic imaging of other abdominal regions, including retroperitoneum: Secondary | ICD-10-CM | POA: Diagnosis not present

## 2021-05-18 DIAGNOSIS — R1030 Lower abdominal pain, unspecified: Secondary | ICD-10-CM

## 2021-05-18 DIAGNOSIS — K56609 Unspecified intestinal obstruction, unspecified as to partial versus complete obstruction: Secondary | ICD-10-CM | POA: Diagnosis not present

## 2021-05-18 DIAGNOSIS — K5790 Diverticulosis of intestine, part unspecified, without perforation or abscess without bleeding: Secondary | ICD-10-CM | POA: Diagnosis not present

## 2021-05-18 LAB — C DIFFICILE QUICK SCREEN W PCR REFLEX
C Diff antigen: NEGATIVE
C Diff interpretation: NOT DETECTED
C Diff toxin: NEGATIVE

## 2021-05-18 LAB — COMPREHENSIVE METABOLIC PANEL
ALT: 19 U/L (ref 0–44)
ALT: 19 U/L (ref 0–44)
AST: 18 U/L (ref 15–41)
AST: 19 U/L (ref 15–41)
Albumin: 3.5 g/dL (ref 3.5–5.0)
Albumin: 3.4 g/dL — ABNORMAL LOW (ref 3.5–5.0)
Alkaline Phosphatase: 55 U/L (ref 38–126)
Alkaline Phosphatase: 56 U/L (ref 38–126)
Anion gap: 7 (ref 5–15)
Anion gap: 6 (ref 5–15)
BUN: 7 mg/dL — ABNORMAL LOW (ref 8–23)
BUN: 6 mg/dL — ABNORMAL LOW (ref 8–23)
CO2: 23 mmol/L (ref 22–32)
CO2: 22 mmol/L (ref 22–32)
Calcium: 8.7 mg/dL — ABNORMAL LOW (ref 8.9–10.3)
Calcium: 8.6 mg/dL — ABNORMAL LOW (ref 8.9–10.3)
Chloride: 103 mmol/L (ref 98–111)
Chloride: 104 mmol/L (ref 98–111)
Creatinine, Ser: 0.75 mg/dL (ref 0.44–1.00)
Creatinine, Ser: 0.61 mg/dL (ref 0.44–1.00)
GFR, Estimated: >60 mL/min (ref >60)
GFR, Estimated: >60 mL/min (ref >60)
Glucose, Bld: 108 mg/dL — ABNORMAL HIGH (ref 70–99)
Glucose, Bld: 113 mg/dL — ABNORMAL HIGH (ref 70–99)
Potassium: 2.7 mmol/L — CL (ref 3.5–5.1)
Potassium: 3.5 mmol/L (ref 3.5–5.1)
Sodium: 133 mmol/L — ABNORMAL LOW (ref 135–145)
Sodium: 132 mmol/L — ABNORMAL LOW (ref 135–145)
Total Bilirubin: 0.6 mg/dL (ref 0.3–1.2)
Total Bilirubin: 0.8 mg/dL (ref 0.3–1.2)
Total Protein: 6.5 g/dL (ref 6.5–8.1)
Total Protein: 6.5 g/dL (ref 6.5–8.1)

## 2021-05-18 LAB — CBC WITH DIFFERENTIAL/PLATELET
Abs Immature Granulocytes: 0.02 10*3/uL (ref 0.00–0.07)
Basophils Absolute: 0 10*3/uL (ref 0.0–0.1)
Basophils Relative: 0 %
Eosinophils Absolute: 0.1 10*3/uL (ref 0.0–0.5)
Eosinophils Relative: 1 %
HCT: 39.3 % (ref 36.0–46.0)
Hemoglobin: 13.3 g/dL (ref 12.0–15.0)
Immature Granulocytes: 0 %
Lymphocytes Relative: 16 %
Lymphs Abs: 1.8 10*3/uL (ref 0.7–4.0)
MCH: 31.9 pg (ref 26.0–34.0)
MCHC: 33.8 g/dL (ref 30.0–36.0)
MCV: 94.2 fL (ref 80.0–100.0)
Monocytes Absolute: 1 10*3/uL (ref 0.1–1.0)
Monocytes Relative: 9 %
Neutro Abs: 8.2 10*3/uL — ABNORMAL HIGH (ref 1.7–7.7)
Neutrophils Relative %: 74 %
Platelets: 322 10*3/uL (ref 150–400)
RBC: 4.17 MIL/uL (ref 3.87–5.11)
RDW: 13.2 % (ref 11.5–15.5)
WBC: 11.2 10*3/uL — ABNORMAL HIGH (ref 4.0–10.5)
nRBC: 0 % (ref 0.0–0.2)

## 2021-05-18 LAB — MAGNESIUM: Magnesium: 2 mg/dL (ref 1.7–2.4)

## 2021-05-18 MED ORDER — ACETAMINOPHEN 650 MG RE SUPP: 650.0000 mg | Freq: Four times a day (QID) | RECTAL | Status: DC | PRN

## 2021-05-18 MED ORDER — POTASSIUM CHLORIDE 2 MEQ/ML IV SOLN
INTRAVENOUS | Status: DC
  Administered 2021-05-20: 300 mL via INTRAVENOUS
  Filled 2021-05-18 (×11): qty 1000

## 2021-05-18 MED ORDER — ONDANSETRON HCL 4 MG PO TABS: 4.0000 mg | ORAL_TABLET | Freq: Four times a day (QID) | ORAL | Status: DC | PRN

## 2021-05-18 MED ORDER — METRONIDAZOLE 500 MG/100ML IV SOLN
500.0000 mg | Freq: Two times a day (BID) | INTRAVENOUS | Status: DC
  Administered 2021-05-18 – 2021-05-21 (×7): 500 mg via INTRAVENOUS
  Filled 2021-05-18 (×7): qty 100

## 2021-05-18 MED ORDER — MONTELUKAST SODIUM 10 MG PO TABS
10.0000 mg | ORAL_TABLET | Freq: Every day | ORAL | Status: DC
  Administered 2021-05-18 – 2021-05-29 (×10): 10 mg via ORAL
  Filled 2021-05-18 (×12): qty 1

## 2021-05-18 MED ORDER — ENOXAPARIN SODIUM 40 MG/0.4ML IJ SOSY
40.0000 mg | PREFILLED_SYRINGE | INTRAMUSCULAR | Status: DC
  Administered 2021-05-18 – 2021-05-21 (×4): 40 mg via SUBCUTANEOUS
  Filled 2021-05-18 (×4): qty 0.4

## 2021-05-18 MED ORDER — CIPROFLOXACIN IN D5W 400 MG/200ML IV SOLN
400.0000 mg | Freq: Two times a day (BID) | INTRAVENOUS | Status: DC
  Administered 2021-05-18 – 2021-05-21 (×7): 400 mg via INTRAVENOUS
  Filled 2021-05-18 (×7): qty 200

## 2021-05-18 MED ORDER — ONDANSETRON HCL 4 MG/2ML IJ SOLN: 4.0000 mg | Freq: Four times a day (QID) | INTRAMUSCULAR | Status: DC | PRN

## 2021-05-18 MED ORDER — PANTOPRAZOLE SODIUM 40 MG PO TBEC
40.0000 mg | DELAYED_RELEASE_TABLET | Freq: Two times a day (BID) | ORAL | Status: DC
  Administered 2021-05-18 – 2021-05-30 (×23): 40 mg via ORAL
  Filled 2021-05-18 (×26): qty 1

## 2021-05-18 MED ORDER — LOSARTAN POTASSIUM 25 MG PO TABS
25.0000 mg | ORAL_TABLET | Freq: Every morning | ORAL | Status: DC
  Administered 2021-05-18 – 2021-05-20 (×3): 25 mg via ORAL
  Filled 2021-05-18 (×3): qty 1

## 2021-05-18 MED ORDER — POTASSIUM CHLORIDE 10 MEQ/100ML IV SOLN
10.0000 meq | INTRAVENOUS | Status: AC
  Administered 2021-05-18 (×3): 10 meq via INTRAVENOUS
  Filled 2021-05-18 (×3): qty 100

## 2021-05-18 MED ORDER — MORPHINE SULFATE (PF) 2 MG/ML IV SOLN
1.0000 mg | INTRAVENOUS | Status: DC | PRN
  Administered 2021-05-18: 1 mg via INTRAVENOUS
  Filled 2021-05-18: qty 1

## 2021-05-18 MED ORDER — ATORVASTATIN CALCIUM 40 MG PO TABS
80.0000 mg | ORAL_TABLET | Freq: Every day | ORAL | Status: DC
  Administered 2021-05-18 – 2021-05-30 (×13): 80 mg via ORAL
  Filled 2021-05-18 (×13): qty 2

## 2021-05-18 MED ORDER — FENTANYL CITRATE PF 50 MCG/ML IJ SOSY
25.0000 ug | PREFILLED_SYRINGE | INTRAMUSCULAR | Status: DC | PRN
Start: 2021-05-18 — End: 2021-05-18
  Administered 2021-05-18 (×2): 25 ug via INTRAVENOUS
  Filled 2021-05-18 (×2): qty 1

## 2021-05-18 MED ORDER — METOPROLOL SUCCINATE ER 25 MG PO TB24
25.0000 mg | ORAL_TABLET | Freq: Every morning | ORAL | Status: DC
  Administered 2021-05-18 – 2021-05-20 (×3): 25 mg via ORAL
  Filled 2021-05-18 (×3): qty 1

## 2021-05-18 MED ORDER — POTASSIUM CHLORIDE CRYS ER 20 MEQ PO TBCR
40.0000 meq | EXTENDED_RELEASE_TABLET | Freq: Once | ORAL | Status: AC
  Administered 2021-05-18: 40 meq via ORAL
  Filled 2021-05-18: qty 2

## 2021-05-18 MED ORDER — ACETAMINOPHEN 325 MG PO TABS: 650.0000 mg | ORAL_TABLET | Freq: Four times a day (QID) | ORAL | Status: DC | PRN

## 2021-05-18 NOTE — Consult Note (Signed)
Reason for Consult:abdominal pain Referring Provider: Verneita Griffes  Valerie Fields is an 71 y.o. female.  HPI: 71 yo female with 2 weeks of lower abdominal pain, nausea, anorexia and diarrhea. Pain is bloaty like. It is near constant. She denies fevers.  She has a long history of diverticulitis and has been treated with antibiotics 4 times. Her last colonoscopy was in 2017 and showed diverticulosis throughout the colon without polyps.  Past Medical History:  Diagnosis Date   Allergy    Asthma    Chronic combined systolic and diastolic CHF 95/28/4132   Ischemic CM // Echocardiogram 7/21: EF 40-45, Gr 2 DD, mid to apical ant/ant-sept AK, RVSP 23.4, trivial MR   Coronary artery disease    NSTEMI 7/21: oLAD 30, pLAD 99 >> PCI: DES; D1 90 >> PCI: POBA, prox and mid RCA 15, EF 35   Diverticulitis    Gallbladder problem    GERD (gastroesophageal reflux disease)    History of heart attack    Hyperlipidemia 10/15/2019   Zetia DC'd during admit for NSTEMI 7/21 >> ? to Atorvastatin   IBS (irritable colon syndrome)    Ischemic cardiomyopathy 10/15/2019   Joint pain    Lactose intolerance    Multiple food allergies    Non-ST elevation (NSTEMI) MI 10/13/2019   PCI:  DES to LAD and POBA to D1    Past Surgical History:  Procedure Laterality Date   BREAST BIOPSY Left    No Scar seen    BREAST CYST EXCISION Left    No scar seen    CARDIAC CATHETERIZATION     COLONOSCOPY     CORONARY STENT INTERVENTION  10/13/2019   CORONARY STENT INTERVENTION N/A 10/13/2019   Procedure: CORONARY STENT INTERVENTION;  Surgeon: Nelva Bush, MD;  Location: [facility];  Service: Cardiovascular;  Laterality: N/A;  mid lad   LEFT HEART CATH AND CORONARY ANGIOGRAPHY N/A 10/13/2019   Procedure: LEFT HEART CATH AND CORONARY ANGIOGRAPHY;  Surgeon: Nelva Bush, MD;  Location: [facility];  Service: Cardiovascular;  Laterality: N/A;   TONSILLECTOMY      Family History  Problem Relation  Age of Onset   GER disease Father    Aneurysm Mother    Hyperlipidemia Mother    Obesity Mother    Stomach cancer Maternal Grandfather    Colon cancer Neg Hx    Esophageal cancer Neg Hx    Pancreatic cancer Neg Hx    Rectal cancer Neg Hx    Breast cancer Neg Hx     Social History:  reports that she has never smoked. She has never used smokeless tobacco. She reports current alcohol use of about 1.0 standard drink per week. She reports that she does not use drugs.  Allergies:  Allergies  Allergen Reactions   Soy Allergy Hives   Sulfa Antibiotics Other (See Comments)    As a child, turned red above neck    Medications: I have reviewed the patient's current medications.  Results for orders placed or performed during the hospital encounter of 05/17/21 (from the past 48 hour(s))  CBC with Differential     Status: Abnormal   Collection Time: 05/17/21  4:32 PM  Result Value Ref Range   WBC 13.8 (H) 4.0 - 10.5 K/uL   RBC 4.91 3.87 - 5.11 MIL/uL   Hemoglobin 15.8 (H) 12.0 - 15.0 g/dL   HCT 45.5 36.0 - 46.0 %   MCV 92.7 80.0 - 100.0 fL  MCH 32.2 26.0 - 34.0 pg   MCHC 34.7 30.0 - 36.0 g/dL   RDW 13.1 11.5 - 15.5 %   Platelets 395 150 - 400 K/uL   nRBC 0.0 0.0 - 0.2 %   Neutrophils Relative % 76 %   Neutro Abs 10.6 (H) 1.7 - 7.7 K/uL   Lymphocytes Relative 15 %   Lymphs Abs 2.0 0.7 - 4.0 K/uL   Monocytes Relative 8 %   Monocytes Absolute 1.1 (H) 0.1 - 1.0 K/uL   Eosinophils Relative 1 %   Eosinophils Absolute 0.1 0.0 - 0.5 K/uL   Basophils Relative 0 %   Basophils Absolute 0.0 0.0 - 0.1 K/uL   Immature Granulocytes 0 %   Abs Immature Granulocytes 0.04 0.00 - 0.07 K/uL    Comment: Performed at [facility], [place] [address] [place], [place] [address]  Comprehensive metabolic panel     Status: Abnormal   Collection Time: 05/17/21  4:32 PM  Result Value Ref Range   Sodium 132 (L) 135 - 145 mmol/L   Potassium 2.9 (L) 3.5 - 5.1 mmol/L   Chloride 100 98 - 111  mmol/L   CO2 22 22 - 32 mmol/L   Glucose, Bld 138 (H) 70 - 99 mg/dL    Comment: Glucose reference range applies only to samples taken after fasting for at least 8 hours.   BUN 8 8 - 23 mg/dL   Creatinine, Ser 0.84 0.44 - 1.00 mg/dL   Calcium 9.4 8.9 - 10.3 mg/dL   Total Protein 7.8 6.5 - 8.1 g/dL   Albumin 4.2 3.5 - 5.0 g/dL   AST 25 15 - 41 U/L   ALT 25 0 - 44 U/L   Alkaline Phosphatase 63 38 - 126 U/L   Total Bilirubin 0.5 0.3 - 1.2 mg/dL   GFR, Estimated >60 >60 mL/min    Comment: (NOTE) Calculated using the CKD-EPI Creatinine Equation (2021)    Anion gap 10 5 - 15    Comment: Performed at [facility], [place] [address], [place], [place] [address]  Lipase, blood     Status: None   Collection Time: 05/17/21  4:32 PM  Result Value Ref Range   Lipase 31 11 - 51 U/L    Comment: Performed at [facility], [place] [address] [place], [place] [address]  Urinalysis, Routine w reflex microscopic Urine, Clean Catch     Status: Abnormal   Collection Time: 05/17/21  4:32 PM  Result Value Ref Range   Color, Urine YELLOW YELLOW   APPearance CLOUDY (A) CLEAR   Specific Gravity, Urine 1.030 1.005 - 1.030   pH 5.0 5.0 - 8.0   Glucose, UA NEGATIVE NEGATIVE mg/dL   Hgb urine dipstick MODERATE (A) NEGATIVE   Bilirubin Urine NEGATIVE NEGATIVE   Ketones, ur 5 (A) NEGATIVE mg/dL   Protein, ur 30 (A) NEGATIVE mg/dL   Nitrite NEGATIVE NEGATIVE   Leukocytes,Ua NEGATIVE NEGATIVE    Comment: Performed at [place] [address] [place], [place] [address]  Resp Panel by RT-PCR (Flu A&B, Covid) Urine, Clean Catch     Status: None   Collection Time: 05/17/21  4:32 PM   Specimen: Urine, Clean Catch; Nasopharyngeal(NP) swabs in vial transport medium  Result Value Ref Range   SARS Coronavirus 2 by RT PCR NEGATIVE NEGATIVE    Comment: (NOTE) SARS-CoV-2 target nucleic acids are NOT DETECTED.  The SARS-CoV-2 RNA is generally detectable in upper  respiratory specimens  during the acute phase of infection. The lowest concentration of SARS-CoV-2 viral copies this assay can detect is 138 copies/mL. A negative result does not preclude SARS-Cov-2 infection and should not be used as the sole basis for treatment or other patient management decisions. A negative result may occur with  improper specimen collection/handling, submission of specimen other than nasopharyngeal swab, presence of viral mutation(s) within the areas targeted by this assay, and inadequate number of viral copies(<138 copies/mL). A negative result must be combined with clinical observations, patient history, and epidemiological information. The expected result is Negative.  Fact Sheet for Patients:  EntrepreneurPulse.com.au  Fact Sheet for Healthcare Providers:  IncredibleEmployment.be  This test is no t yet approved or cleared by the [place] FDA and  has been authorized for detection and/or diagnosis of SARS-CoV-2 by FDA under an Emergency Use Authorization (EUA). This EUA will remain  in effect (meaning this test can be used) for the duration of the COVID-19 declaration under Section 564(b)(1) of the Act, 21 U.S.C.section 360bbb-3(b)(1), unless the authorization is terminated  or revoked sooner.       Influenza A by PCR NEGATIVE NEGATIVE   Influenza B by PCR NEGATIVE NEGATIVE    Comment: (NOTE) The Xpert Xpress SARS-CoV-2/FLU/RSV plus assay is intended as an aid in the diagnosis of influenza from Nasopharyngeal swab specimens and should not be used as a sole basis for treatment. Nasal washings and aspirates are unacceptable for Xpert Xpress SARS-CoV-2/FLU/RSV testing.  Fact Sheet for Patients: EntrepreneurPulse.com.au  Fact Sheet for Healthcare Providers: IncredibleEmployment.be  This test is not yet approved or cleared by the [place] FDA and has been authorized for  detection and/or diagnosis of SARS-CoV-2 by FDA under an Emergency Use Authorization (EUA). This EUA will remain in effect (meaning this test can be used) for the duration of the COVID-19 declaration under Section 564(b)(1) of the Act, 21 U.S.C. section 360bbb-3(b)(1), unless the authorization is terminated or revoked.  Performed at [facility], [place] [address] [place], [place] [address]   Urinalysis, Microscopic (reflex)     Status: Abnormal   Collection Time: 05/17/21  4:32 PM  Result Value Ref Range   RBC / HPF 21-50 0 - 5 RBC/hpf   WBC, UA 0-5 0 - 5 WBC/hpf   Bacteria, UA MANY (A) NONE SEEN   Squamous Epithelial / LPF 11-20 0 - 5   Mucus PRESENT     Comment: Performed at [facility], [place] [address], [place], [place] [address]  POC occult blood, ED     Status: None   Collection Time: 05/17/21 10:56 PM  Result Value Ref Range   Fecal Occult Bld NEGATIVE NEGATIVE  Magnesium     Status: None   Collection Time: 05/18/21  7:40 AM  Result Value Ref Range   Magnesium 2.0 1.7 - 2.4 mg/dL    Comment: Performed at [facility], [place] [address] [place],  [address]  Comprehensive metabolic panel     Status: Abnormal   Collection Time: 05/18/21  7:40 AM  Result Value Ref Range   Sodium 133 (L) 135 - 145 mmol/L   Potassium 2.7 (LL) 3.5 - 5.1 mmol/L    Comment: NO VISIBLE HEMOLYSIS DELTA CHECK NOTED CRITICAL RESULT CALLED TO, READ BACK BY AND VERIFIED WITH: Marisa Hua RN AT [phone] 05/18/2021 BY MECIAL J.     Chloride 103 98 - 111 mmol/L   CO2 23 22 - 32 mmol/L   Glucose, Bld 108 (  H) 70 - 99 mg/dL    Comment: Glucose reference range applies only to samples taken after fasting for at least 8 hours.   BUN 7 (L) 8 - 23 mg/dL   Creatinine, Ser 0.75 0.44 - 1.00 mg/dL   Calcium 8.7 (L) 8.9 - 10.3 mg/dL   Total Protein 6.5 6.5 - 8.1 g/dL   Albumin 3.5 3.5 - 5.0 g/dL   AST 18 15 - 41 U/L   ALT 19 0 - 44 U/L   Alkaline  Phosphatase 55 38 - 126 U/L   Total Bilirubin 0.6 0.3 - 1.2 mg/dL   GFR, Estimated >60 >60 mL/min    Comment: (NOTE) Calculated using the CKD-EPI Creatinine Equation (2021)    Anion gap 7 5 - 15    Comment: Performed at [facility], [place] [address] [place], [place] [address]  CBC with Differential/Platelet     Status: Abnormal   Collection Time: 05/18/21  7:40 AM  Result Value Ref Range   WBC 11.2 (H) 4.0 - 10.5 K/uL   RBC 4.17 3.87 - 5.11 MIL/uL   Hemoglobin 13.3 12.0 - 15.0 g/dL   HCT 39.3 36.0 - 46.0 %   MCV 94.2 80.0 - 100.0 fL   MCH 31.9 26.0 - 34.0 pg   MCHC 33.8 30.0 - 36.0 g/dL   RDW 13.2 11.5 - 15.5 %   Platelets 322 150 - 400 K/uL   nRBC 0.0 0.0 - 0.2 %   Neutrophils Relative % 74 %   Neutro Abs 8.2 (H) 1.7 - 7.7 K/uL   Lymphocytes Relative 16 %   Lymphs Abs 1.8 0.7 - 4.0 K/uL   Monocytes Relative 9 %   Monocytes Absolute 1.0 0.1 - 1.0 K/uL   Eosinophils Relative 1 %   Eosinophils Absolute 0.1 0.0 - 0.5 K/uL   Basophils Relative 0 %   Basophils Absolute 0.0 0.0 - 0.1 K/uL   Immature Granulocytes 0 %   Abs Immature Granulocytes 0.02 0.00 - 0.07 K/uL    Comment: Performed at [facility], [place] [address], [place], [place] [address]    DG Abd 1 View  Result Date: 05/18/2021 CLINICAL DATA:  71 year old female with distal colonic obstruction. EXAM: ABDOMEN - 1 VIEW COMPARISON:  CT Abdomen and Pelvis 05/17/2021. FINDINGS: Portable AP supine view at 0747 hours. Ongoing moderate to severe dilated large bowel to the pelvis. Comparison CT yesterday with appearance suspicious for apple-core lesion at the rectosigmoid junction. No pneumoperitoneum identified on the supine views. Grossly negative lung bases. No acute osseous abnormality identified. IMPRESSION: High-grade distal large bowel obstruction with CT appearance suspicious for rectosigmoid junction apple-core lesion. Distal Colon Carcinoma until proven otherwise. Recommended  Sigmoidoscopy. Electronically Signed   By: Genevie Ann M.D.   On: 05/18/2021 08:21   CT Abdomen Pelvis W Contrast  Result Date: 05/17/2021 CLINICAL DATA:  Bowel obstruction suspected Diverticulitis, complication suspected EXAM: CT ABDOMEN AND PELVIS WITH CONTRAST TECHNIQUE: Multidetector CT imaging of the abdomen and pelvis was performed using the standard protocol following bolus administration of intravenous contrast. RADIATION DOSE REDUCTION: This exam was performed according to the departmental dose-optimization program which includes automated exposure control, adjustment of the mA and/or kV according to patient size and/or use of iterative reconstruction technique. CONTRAST:  119mL OMNIPAQUE IOHEXOL 300 MG/ML  SOLN COMPARISON:  Radiograph 05/16/2021 FINDINGS: Lower chest: LAD stent or calcifications. Mitral annular calcifications. Apical and apical septal endocardial hypoattenuation compatible with prior infarct. No acute abnormality. Hepatobiliary: No focal  liver abnormality is seen. The gallbladder is unremarkable. Pancreas: Unremarkable. No pancreatic ductal dilatation or surrounding inflammatory changes. Spleen: Normal in size without focal abnormality. Adrenals/Urinary Tract: There is a 2.5 cm left adrenal nodule, previously measuring 2.3 cm (series 2, image 28). On previous exam in 2020, this measured less than 10 Hounsfield units, consistent with adenoma (this was a noncontrast exam). No hydronephrosis or nephrolithiasis. Left renal sinus cysts. The bladder is decompressed. Stomach/Bowel: There is near diffuse fluid-filled dilatation of the colon with air-fluid levels. There are scattered diverticula without definite diverticulitis. There is some inflammatory stranding along the ascending colon. Decompression at the rectosigmoid junction (series 2, image 78).The appendix is fluid-filled without any adjacent inflammatory stranding. No evidence of appendiceal obstruction/appendicitis. Vascular/Lymphatic:  No significant vascular findings are present. No enlarged abdominal or pelvic lymph nodes. Reproductive: Unremarkable. Other: Small volume free fluid in the pelvis, likely reactive. No hernia. Musculoskeletal: No acute osseous abnormality. No suspicious lytic or blastic lesions. Multilevel degenerative changes of the spine with trace retrolisthesis at L5-S1. IMPRESSION: Near diffuse fluid-filled dilatation of the colon with air-fluid levels and mild inflammatory stranding along the ascending colon. Findings are consistent severe diarrheal illness with reactive stranding along the ascending colon/developing colitis. There is a transition to decompressed colon at the rectosigmoid junction, distal obstruction cannot be excluded. 2.5 cm left adrenal nodule, minimally increased in size since prior exam in 2020 (previously measured 2.3 cm), imaging characteristics most consistent with adenoma. Recommend biochemical testing to assess for functionality. Electronically Signed   By: Maurine Simmering M.D.   On: 05/17/2021 11:35   DG Abd 2 Views  Result Date: 05/16/2021 CLINICAL DATA:  Abdominal pain. Gas. Bloating. Nausea and diarrhea for 10 days. EXAM: ABDOMEN - 2 VIEW COMPARISON:  02/03/2019 CT FINDINGS: Upright view of the abdomen and upper pelvis. Supine views of the abdomen and pelvis. The upright view demonstrates no free intraperitoneal air. Fluid levels throughout the colon. Small bowel air-fluid levels are also suspected within the right mid and lower abdomen. Supine images demonstrate mild gaseous distention of the transverse colon at up to 8.9 cm. No gaseous distention of small bowel loops. Paucity of distal gas. Presumed phleboliths in the left hemipelvis. No abnormal abdominal calcifications. Minimal convex right lumbar spine curvature. IMPRESSION: Mild gaseous distension of the colon with colonic and less so small-bowel air-fluid levels. Nonspecific, possibly related to adynamic ileus in the setting of diarrhea  and viral gastroenteritis. Distal obstruction could look similar. Electronically Signed   By: Abigail Miyamoto M.D.   On: 05/16/2021 15:27   DG Abd Acute W/Chest  Result Date: 05/17/2021 CLINICAL DATA:  Abdominal pain, nausea EXAM: DG ABDOMEN ACUTE WITH 1 VIEW CHEST COMPARISON:  05/17/2021 FINDINGS: Supine and upright frontal views of the abdomen as well as an upright frontal view of the chest are obtained. The cardiac silhouette is unremarkable. The lungs are clear. There is diffuse gaseous distention of the colon with multiple gas fluid levels, unchanged since earlier CT. Please refer to preceding CT evaluation describing wall thickening at the rectosigmoid junction with differential of colitis, scarring, or mass. No free gas in the greater peritoneal sac. No masses or abnormal calcifications. IMPRESSION: 1. Stable marked colonic distension and gas fluid levels. Please refer to preceding CT describing wall thickening and transition point at the rectosigmoid junction. 2. No acute intrathoracic process. Electronically Signed   By: Randa Ngo M.D.   On: 05/17/2021 21:35    ROS  PE Blood pressure 131/65, pulse 74, temperature (!)  97.5 F (36.4 C), temperature source Oral, resp. rate (!) 21, height 5\' 2"  (1.575 m), weight 93 kg, SpO2 94 %. Constitutional: NAD; conversant; no deformities Eyes: Moist conjunctiva; no lid lag; anicteric; PERRL Neck: Trachea midline; no thyromegaly Lungs: Normal respiratory effort; no tactile fremitus CV: RRR; no palpable thrills; no pitting edema GI: Abd soft, distended; no palpable hepatosplenomegaly MSK: Normal gait; no clubbing/cyanosis Psychiatric: Appropriate affect; alert and oriented x3 Lymphatic: No palpable cervical or axillary lymphadenopathy Skin: No major subcutaneous nodules. Warm and dry   Assessment/Plan: 71 yo female with stricture at the level of the sigmoid. This is likely diverticular though could also be neoplastic. Agree with plan for  sigmoidoscopy. Discussed with patient that she likely will require surgery to either remove this portion of colon or divert during this hospitalization -NPO -IV fluids  I reviewed ED provider notes, last 24 h vitals and pain scores, last 48 h intake and output, last 24 h labs and trends, and last 24 h imaging results.  This care required high  level of medical decision making.   Arta Bruce Johonna Binette 05/18/2021, 11:50 AM

## 2021-05-18 NOTE — Consult Note (Addendum)
HISTORY OF PRESENT ILLNESS:  Valerie Fields is a 71 y.o. female with past medical history as listed below.  We are asked to see her in consultation regarding abdominal pain and abnormal radiologic imaging.  Patient has a history of recurrent diverticulitis.  She has been treated with antibiotics on a number of occasions.  She tells me that in recent months she has noticed intermittent problems with lower abdominal discomfort.  More recently, sensation of difficulty with defecation.  Significantly worsening problems over the past 2 weeks.  Has been treated with antispasmodics and probiotics without relief.  She was seen in the office 2 days ago and was noted to have significant abdominal distention for which CT scan and abdominal films were ordered.  She was found to have colonic dilation with an apparent transition point in the sigmoid colon suggesting stricture or obstruction.  She has had 3 prior colonoscopies.  Her last colonoscopy was in 2017.  She was found to have pandiverticulosis with stenosis in the left colon secondary to diverticular disease.  Patient tells me that her discomfort was as bad as 9 out of 10.  Currently she describes it as 3 or 4 out of 10.  She has had no vomiting.  She did have some liquid stool after her CT scan.  Not passing flatus.  No fevers.  LABORATORIES: Sodium 133, potassium 2.7, creatinine 0.75, normal liver tests, white blood cell count 11.2, hemoglobin 13.3  X-rays: 1.  Abdominal plain films from today: IMPRESSION: High-grade distal large bowel obstruction with CT appearance suspicious for rectosigmoid junction apple-core lesion. Distal Colon Carcinoma until proven otherwise. Recommended Sigmoidoscopy. 2.  CT scan yesterday: IMPRESSION: Near diffuse fluid-filled dilatation of the colon with air-fluid levels and mild inflammatory stranding along the ascending colon. Findings are consistent severe diarrheal illness with reactive stranding along the  ascending colon/developing colitis. There is a transition to decompressed colon at the rectosigmoid junction, distal obstruction cannot be excluded.   REVIEW OF SYSTEMS:  All non-GI ROS negative.  Past Medical History:  Diagnosis Date   Allergy    Asthma    Chronic combined systolic and diastolic CHF 92/42/6834   Ischemic CM // Echocardiogram 7/21: EF 40-45, Gr 2 DD, mid to apical ant/ant-sept AK, RVSP 23.4, trivial MR   Coronary artery disease    NSTEMI 7/21: oLAD 30, pLAD 99 >> PCI: DES; D1 90 >> PCI: POBA, prox and mid RCA 15, EF 35   Diverticulitis    Gallbladder problem    GERD (gastroesophageal reflux disease)    History of heart attack    Hyperlipidemia 10/15/2019   Zetia DC'd during admit for NSTEMI 7/21 >> ? to Atorvastatin   IBS (irritable colon syndrome)    Ischemic cardiomyopathy 10/15/2019   Joint pain    Lactose intolerance    Multiple food allergies    Non-ST elevation (NSTEMI) MI 10/13/2019   PCI:  DES to LAD and POBA to D1    Past Surgical History:  Procedure Laterality Date   BREAST BIOPSY Left    No Scar seen    BREAST CYST EXCISION Left    No scar seen    CARDIAC CATHETERIZATION     COLONOSCOPY     CORONARY STENT INTERVENTION  10/13/2019   CORONARY STENT INTERVENTION N/A 10/13/2019   Procedure: CORONARY STENT INTERVENTION;  Surgeon: Nelva Bush, MD;  Location: [place] [place] [facility];  Service: Cardiovascular;  Laterality: N/A;  mid lad   LEFT HEART CATH AND CORONARY ANGIOGRAPHY N/A  10/13/2019   Procedure: LEFT HEART CATH AND CORONARY ANGIOGRAPHY;  Surgeon: Nelva Bush, MD;  Location: [place] [facility];  Service: Cardiovascular;  Laterality: N/A;   TONSILLECTOMY      Social History Valerie Fields  reports that she has never smoked. She has never used smokeless tobacco. She reports current alcohol use of about 1.0 standard drink per week. She reports that she does not use drugs.  family history includes Aneurysm in her mother; GER disease  in her father; Hyperlipidemia in her mother; Obesity in her mother; Stomach cancer in her maternal grandfather.  Allergies  Allergen Reactions   Soy Allergy Hives   Sulfa Antibiotics Other (See Comments)    As a child, turned red above neck       PHYSICAL EXAMINATION: Vital signs: BP 127/76    Pulse 62    Temp (!) 97.5 F (36.4 C) (Oral)    Resp 20    Ht 5\' 2"  (1.575 m)    Wt 93 kg    SpO2 96%    BMI 37.49 kg/m   Constitutional: generally well-appearing, no acute distress Psychiatric: alert and oriented x3, cooperative, no acute distress Eyes: extraocular movements intact, anicteric, conjunctiva pink Mouth: oral pharynx moist, no lesions Neck: supple no lymphadenopathy Cardiovascular: heart regular rate and rhythm, no murmur Lungs: clear to auscultation bilaterally Abdomen: Obese, distended, mild tenderness in the lower abdomen, no obvious ascites, no peritoneal signs, normal bowel sounds, no organomegaly Rectal: Deferred Extremities: no clubbing, cyanosis, or significant lower extremity edema bilaterally Skin: no lesions on visible extremities Neuro: No focal deficits.  Cranial nerves intact  ASSESSMENT:  1.  Recurrent progressive abdominal pain with distention secondary to progressive obstructive process in the sigmoid colon.  Most likely benign stricture secondary to diverticular disease.  Does not look toxic.  Less pain than 2 days ago.  Having some liquid stool.  Not passing flatus. 2.  3 prior colonoscopies.  Last examination 2017 with pandiverticulosis with left-sided stenosis 3.  Hypokalemia and mild hyponatremia 4.  Multiple medical problems.  Stable   PLAN:  1.  N.p.o. 2.  Hydration 3.  Correct electrolyte abnormalities 4.  Plan for flexible sigmoidoscopy in a.m. to see if the area of interest and be assessed.  The examination has been posted for 10:30 AM  5.  Agree with general surgery, that she almost certainly need colonic resection regardless of the  etiology 6.  If the patient's abdominal problems were progressed prior to tomorrow's examination, that she would require more urgent surgery.  She understands.  Docia Chuck. Geri Seminole., M.D. [facility] Division of Gastroenterology

## 2021-05-18 NOTE — Progress Notes (Addendum)
°  Progress Note   Patient: Valerie Fields EEF:[id number] DOB: 1950-08-22 DOA: 05/17/2021     1 DOS: the patient was seen and examined on 05/18/2021   Brief hospital course: 71 year old white female community dwelling Known reflux BMI 37.4 Obstructive sleep apnea NSTEMI 10/18/2019-cath = single-vessel CAD high-grade LAD stenosis Rx DES Combined heart failure systolic diastolic EF 88% at that time with improvement to 55-60% on office visit 04/24/2021 HLD Colonoscopy 2017 diverticulosis--biliary dyskinesia supposed to see general surgery  Patient saw [facility] 2/16 as per her note-generalized abdominal pain modulated by dicyclomine On exam at that time she had a tense abdomen with tympanitic sounds Told to trial MiraLAX, Cipro Flagyl   Data Sodium 132 K 2.9 BUNs/creatinine 8/0.8 hemoglobin 15 C. difficile neg  Assessment and Plan:  Recurrent diverticulitis with probable diverticular stricture?  Sigmoid col Discontinued Fentanyl changed to morphine 1 mg every 2 as needed severe pain Appreciate the care coordination between GI/GEN surg N.p.o. after midnight--?Sigmoidoscopy in am?saline / below Bentyl, psyllium etc. as per GI Profound hypokalemia Received several rounds of potassium--potassium is slowly improving and should normalize by a.m. Continue potassium in LR 100 cc/H +40 mEq Recheck a.m. mag Leukocytosis normalization 2/2 underlying diverticulitis Continue prior to admission IV antibiotics but changed to IV-continue Cipro Flagyl CAD + LAD stenosis status post DES, HF R EF-HF improved EF HTN Losartan 25 to continue metoprolol XL 25 continue Lipitor Plavix 75 on hold for procedure resume when decided upon by specialists who are doing procedures     Subjective: Awake alert comfortable quite distended and no belly passing some gas tolerating some diet No nausea no vomiting no NG tube Seen downstairs in the ED and then briefly upstairs when she got  a room  Physical Exam: Vitals:   05/18/21 0500 05/18/21 0530 05/18/21 0700 05/18/21 0900  BP: 134/66 126/68 130/64 131/65  Pulse: 71 (!) 58 65 74  Resp: 20 19 16  (!) 21  Temp:      TempSrc:      SpO2: 97% 97% 95% 94%  Weight:      Height:       EOMI NCAT thick neck Mallampati 2 CTA B no added sound no rales rhonchi Abdomen obese quite tympanitic cannot really appreciate bowel sounds no rebound no guarding cannot appreciate HSM S1-S2 no murmur ROM intact no lower extremity edema  Data Reviewed:  Potassium improved from admit, white count down  Family Communication: None present at the bedside  Disposition: Status is: Inpatient Remains inpatient appropriate because: Requires scope in a.m. and may be surgery    Planned Discharge Destination:  To be determined at this time     Time spent: 37 minutes  Author: Nita Sells, MD 05/18/2021 10:28 AM  For on call review [email or web address].

## 2021-05-18 NOTE — H&P (View-Only) (Signed)
HISTORY OF PRESENT ILLNESS:  Valerie Fields is a 71 y.o. female with past medical history as listed below.  We are asked to see her in consultation regarding abdominal pain and abnormal radiologic imaging.  Patient has a history of recurrent diverticulitis.  She has been treated with antibiotics on a number of occasions.  She tells me that in recent months she has noticed intermittent problems with lower abdominal discomfort.  More recently, sensation of difficulty with defecation.  Significantly worsening problems over the past 2 weeks.  Has been treated with antispasmodics and probiotics without relief.  She was seen in the office 2 days ago and was noted to have significant abdominal distention for which CT scan and abdominal films were ordered.  She was found to have colonic dilation with an apparent transition point in the sigmoid colon suggesting stricture or obstruction.  She has had 3 prior colonoscopies.  Her last colonoscopy was in 2017.  She was found to have pandiverticulosis with stenosis in the left colon secondary to diverticular disease.  Patient tells me that her discomfort was as bad as 9 out of 10.  Currently she describes it as 3 or 4 out of 10.  She has had no vomiting.  She did have some liquid stool after her CT scan.  Not passing flatus.  No fevers.  LABORATORIES: Sodium 133, potassium 2.7, creatinine 0.75, normal liver tests, white blood cell count 11.2, hemoglobin 13.3  X-rays: 1.  Abdominal plain films from today: IMPRESSION: High-grade distal large bowel obstruction with CT appearance suspicious for rectosigmoid junction apple-core lesion. Distal Colon Carcinoma until proven otherwise. Recommended Sigmoidoscopy. 2.  CT scan yesterday: IMPRESSION: Near diffuse fluid-filled dilatation of the colon with air-fluid levels and mild inflammatory stranding along the ascending colon. Findings are consistent severe diarrheal illness with reactive stranding along the  ascending colon/developing colitis. There is a transition to decompressed colon at the rectosigmoid junction, distal obstruction cannot be excluded.   REVIEW OF SYSTEMS:  All non-GI ROS negative.  Past Medical History:  Diagnosis Date   Allergy    Asthma    Chronic combined systolic and diastolic CHF 59/93/5701   Ischemic CM // Echocardiogram 7/21: EF 40-45, Gr 2 DD, mid to apical ant/ant-sept AK, RVSP 23.4, trivial MR   Coronary artery disease    NSTEMI 7/21: oLAD 30, pLAD 99 >> PCI: DES; D1 90 >> PCI: POBA, prox and mid RCA 15, EF 35   Diverticulitis    Gallbladder problem    GERD (gastroesophageal reflux disease)    History of heart attack    Hyperlipidemia 10/15/2019   Zetia DC'd during admit for NSTEMI 7/21 >> ? to Atorvastatin   IBS (irritable colon syndrome)    Ischemic cardiomyopathy 10/15/2019   Joint pain    Lactose intolerance    Multiple food allergies    Non-ST elevation (NSTEMI) MI 10/13/2019   PCI:  DES to LAD and POBA to D1    Past Surgical History:  Procedure Laterality Date   BREAST BIOPSY Left    No Scar seen    BREAST CYST EXCISION Left    No scar seen    CARDIAC CATHETERIZATION     COLONOSCOPY     CORONARY STENT INTERVENTION  10/13/2019   CORONARY STENT INTERVENTION N/A 10/13/2019   Procedure: CORONARY STENT INTERVENTION;  Surgeon: Nelva Bush, MD;  Location: [facility];  Service: Cardiovascular;  Laterality: N/A;  mid lad   LEFT HEART CATH AND CORONARY ANGIOGRAPHY N/A  10/13/2019   Procedure: LEFT HEART CATH AND CORONARY ANGIOGRAPHY;  Surgeon: Nelva Bush, MD;  Location: [facility];  Service: Cardiovascular;  Laterality: N/A;   TONSILLECTOMY      Social History Valerie Fields  reports that she has never smoked. She has never used smokeless tobacco. She reports current alcohol use of about 1.0 standard drink per week. She reports that she does not use drugs.  family history includes Aneurysm in her mother; GER disease  in her father; Hyperlipidemia in her mother; Obesity in her mother; Stomach cancer in her maternal grandfather.  Allergies  Allergen Reactions   Soy Allergy Hives   Sulfa Antibiotics Other (See Comments)    As a child, turned red above neck       PHYSICAL EXAMINATION: Vital signs: BP 127/76    Pulse 62    Temp (!) 97.5 F (36.4 C) (Oral)    Resp 20    Ht 5\' 2"  (1.575 m)    Wt 93 kg    SpO2 96%    BMI 37.49 kg/m   Constitutional: generally well-appearing, no acute distress Psychiatric: alert and oriented x3, cooperative, no acute distress Eyes: extraocular movements intact, anicteric, conjunctiva pink Mouth: oral pharynx moist, no lesions Neck: supple no lymphadenopathy Cardiovascular: heart regular rate and rhythm, no murmur Lungs: clear to auscultation bilaterally Abdomen: Obese, distended, mild tenderness in the lower abdomen, no obvious ascites, no peritoneal signs, normal bowel sounds, no organomegaly Rectal: Deferred Extremities: no clubbing, cyanosis, or significant lower extremity edema bilaterally Skin: no lesions on visible extremities Neuro: No focal deficits.  Cranial nerves intact  ASSESSMENT:  1.  Recurrent progressive abdominal pain with distention secondary to progressive obstructive process in the sigmoid colon.  Most likely benign stricture secondary to diverticular disease.  Does not look toxic.  Less pain than 2 days ago.  Having some liquid stool.  Not passing flatus. 2.  3 prior colonoscopies.  Last examination 2017 with pandiverticulosis with left-sided stenosis 3.  Hypokalemia and mild hyponatremia 4.  Multiple medical problems.  Stable   PLAN:  1.  N.p.o. 2.  Hydration 3.  Correct electrolyte abnormalities 4.  Plan for flexible sigmoidoscopy in a.m. to see if the area of interest and be assessed.  The examination has been posted for 10:30 AM  5.  Agree with general surgery, that she almost certainly need colonic resection regardless of the  etiology 6.  If the patient's abdominal problems were progressed prior to tomorrow's examination, that she would require more urgent surgery.  She understands.  Docia Chuck. Geri Seminole., M.D. [facility] Division of Gastroenterology

## 2021-05-18 NOTE — Hospital Course (Addendum)
71 year old white female community dwelling Known reflux BMI 37.4 Obstructive sleep apnea NSTEMI 10/18/2019-cath = single-vessel CAD high-grade LAD stenosis Rx DES Combined heart failure systolic diastolic EF 89% at that time with improvement to 55-60% on office visit 04/24/2021 HLD Colonoscopy 2017 diverticulosis--biliary dyskinesia supposed to see general surgery  Patient saw [facility] gastro[facility] 2/16 as per her note-generalized abdominal pain modulated by dicyclomine On exam at that time she had a tense abdomen with tympanitic sounds Told to trial MiraLAX, Cipro Flagyl   Data Sodium 132 K 2.9 BUNs/creatinine 8/0.8 hemoglobin 15 C. difficile neg  Seen by GI/Gen surg--Had flex sig--underwent operative management 05/20/2021 with right hemicolectomy and loop sigmoid colostomy-this had to be an open procedure secondary to very distal colonic stricture deep in the pelvis with a perforated cecum-patient had feculent peritonitis at the time of operation

## 2021-05-19 ENCOUNTER — Encounter (HOSPITAL_COMMUNITY): Payer: Self-pay | Admitting: Internal Medicine

## 2021-05-19 ENCOUNTER — Inpatient Hospital Stay (HOSPITAL_COMMUNITY): Payer: Medicare HMO | Admitting: Certified Registered"

## 2021-05-19 ENCOUNTER — Encounter (HOSPITAL_COMMUNITY)
Admission: EM | Disposition: A | Discharge status: Home / Self Care | Payer: Self-pay | Source: Home / Self Care | Attending: Internal Medicine

## 2021-05-19 DIAGNOSIS — K56609 Unspecified intestinal obstruction, unspecified as to partial versus complete obstruction: Secondary | ICD-10-CM

## 2021-05-19 DIAGNOSIS — I252 Old myocardial infarction: Secondary | ICD-10-CM

## 2021-05-19 DIAGNOSIS — K56699 Other intestinal obstruction unspecified as to partial versus complete obstruction: Secondary | ICD-10-CM

## 2021-05-19 DIAGNOSIS — R933 Abnormal findings on diagnostic imaging of other parts of digestive tract: Secondary | ICD-10-CM

## 2021-05-19 DIAGNOSIS — I2510 Atherosclerotic heart disease of native coronary artery without angina pectoris: Secondary | ICD-10-CM

## 2021-05-19 HISTORY — PX: FLEXIBLE SIGMOIDOSCOPY: SHX5431

## 2021-05-19 LAB — COMPREHENSIVE METABOLIC PANEL
ALT: 17 U/L (ref 0–44)
AST: 17 U/L (ref 15–41)
Albumin: 3.1 g/dL — ABNORMAL LOW (ref 3.5–5.0)
Alkaline Phosphatase: 49 U/L (ref 38–126)
Anion gap: 6 (ref 5–15)
BUN: <5 mg/dL — ABNORMAL LOW (ref 8–23)
CO2: 22 mmol/L (ref 22–32)
Calcium: 8.7 mg/dL — ABNORMAL LOW (ref 8.9–10.3)
Chloride: 107 mmol/L (ref 98–111)
Creatinine, Ser: 0.7 mg/dL (ref 0.44–1.00)
GFR, Estimated: >60 mL/min (ref >60)
Glucose, Bld: 113 mg/dL — ABNORMAL HIGH (ref 70–99)
Potassium: 3.1 mmol/L — ABNORMAL LOW (ref 3.5–5.1)
Sodium: 135 mmol/L (ref 135–145)
Total Bilirubin: 0.6 mg/dL (ref 0.3–1.2)
Total Protein: 6 g/dL — ABNORMAL LOW (ref 6.5–8.1)

## 2021-05-19 LAB — MAGNESIUM: Magnesium: 2 mg/dL (ref 1.7–2.4)

## 2021-05-19 LAB — HIV ANTIBODY (ROUTINE TESTING W REFLEX): HIV Screen 4th Generation wRfx: NONREACTIVE

## 2021-05-19 SURGERY — SIGMOIDOSCOPY, FLEXIBLE
Anesthesia: Monitor Anesthesia Care

## 2021-05-19 SURGERY — SIGMOIDOSCOPY, FLEXIBLE

## 2021-05-19 MED ORDER — POTASSIUM CHLORIDE 10 MEQ/100ML IV SOLN
10.0000 meq | INTRAVENOUS | Status: AC
  Administered 2021-05-19 (×2): 10 meq via INTRAVENOUS
  Filled 2021-05-19 (×2): qty 100

## 2021-05-19 MED ORDER — LACTATED RINGERS IV SOLN: INTRAVENOUS | Status: DC | PRN

## 2021-05-19 MED ORDER — FLEET ENEMA 7-19 GM/118ML RE ENEM
ENEMA | RECTAL | Status: AC
  Filled 2021-05-19: qty 2

## 2021-05-19 MED ORDER — FLEET ENEMA 7-19 GM/118ML RE ENEM
2.0000 | ENEMA | Freq: Once | RECTAL | Status: AC
  Administered 2021-05-19: 2 via RECTAL

## 2021-05-19 MED ORDER — PROPOFOL 500 MG/50ML IV EMUL
INTRAVENOUS | Status: DC | PRN
  Administered 2021-05-19: 135 ug/kg/min via INTRAVENOUS

## 2021-05-19 MED ORDER — PROPOFOL 10 MG/ML IV BOLUS
INTRAVENOUS | Status: DC | PRN
  Administered 2021-05-19: 20 mg via INTRAVENOUS

## 2021-05-19 MED ORDER — PROPOFOL 500 MG/50ML IV EMUL
INTRAVENOUS | Status: AC
Start: 2021-05-19 — End: ?
  Filled 2021-05-19: qty 50

## 2021-05-19 MED ORDER — PROPOFOL 10 MG/ML IV BOLUS
INTRAVENOUS | Status: AC
  Filled 2021-05-19: qty 20

## 2021-05-19 NOTE — Interval H&P Note (Signed)
History and Physical Interval Note:  05/19/2021 9:48 AM  [name]  has presented today for surgery, with the diagnosis of colonic obstruction.  The various methods of treatment have been discussed with the patient and family. After consideration of risks, benefits and other options for treatment, the patient has consented to  Procedure(s): FLEXIBLE SIGMOIDOSCOPY (N/A) as a surgical intervention.  The patient's history has been reviewed, patient examined, no change in status, stable for surgery.  I have reviewed the patient's chart and labs.  Questions were answered to the patient's satisfaction.     Valerie Fields

## 2021-05-19 NOTE — Op Note (Signed)
[facility] Patient Name: Valerie Fields Procedure Date: 05/19/2021 MRN: [id number] Attending MD: Docia Chuck. Henrene Pastor , MD Date of Birth: 01-22-1951 CSN: [id number] Age: 71 Admit Type: Inpatient Procedure:                Flexible Sigmoidoscopy Indications:              Abnormal CT of the GI tract Providers:                Docia Chuck. Henrene Pastor, MD, Doristine Johns, RN, Luan Moore, Technician, Maudry Diego, CRNA Referring MD:              Medicines:                Monitored Anesthesia Care Complications:            No immediate complications. Estimated Blood Loss:     Estimated blood loss: none. Procedure:                Pre-Anesthesia Assessment:                           - Prior to the procedure, a History and Physical                            was performed, and patient medications and                            allergies were reviewed. The patient is competent.                            The risks and benefits of the procedure and the                            sedation options and risks were discussed with the                            patient. All questions were answered and informed                            consent was obtained. Patient identification and                            proposed procedure were verified by the physician                            in the pre-procedure area. Mental Status                            Examination: alert and oriented. Respiratory                            Examination: clear to auscultation. CV Examination:  normal. Prophylactic Antibiotics: The patient does                            not require prophylactic antibiotics. Prior                            Anticoagulants: The patient has taken no previous                            anticoagulant or antiplatelet agents. ASA Grade                            Assessment: III - A patient with severe systemic                             disease. After reviewing the risks and benefits,                            the patient was deemed in satisfactory condition to                            undergo the procedure. The anesthesia plan was to                            use monitored anesthesia care (MAC). Immediately                            prior to administration of medications, the patient                            was re-assessed for adequacy to receive sedatives.                            The heart rate, respiratory rate, oxygen                            saturations, blood pressure, adequacy of pulmonary                            ventilation, and response to care were monitored                            throughout the procedure. The physical status of                            the patient was re-assessed after the procedure.                           After obtaining informed consent, the scope was                            passed under direct vision. The PCF-HQ190L                            (  [id number]) Olympus colonoscope (subsequently upper                            endoscope) was introduced through the anus and                            advanced to the the descending colon. The flexible                            sigmoidoscopy was accomplished without difficulty.                            The patient tolerated the procedure well. The                            quality of the bowel preparation was adequate. Scope In: 9:51:38 AM Scope Out: 10:10:58 AM Total Procedure Duration: 0 hours 19 minutes 20 seconds  Findings:      There was a high-grade benign-appearing stricture in the distal sigmoid       colon. This could not be traversed with the pediatric colonoscope. It       was traversed with the upper endoscope. The scope was advanced to the       descending colon. Large volumes of liquid stool. Water was used       exclusively during the entire examination. Colon beyond the stricture       was  decompressed as best as possible. No evidence for cancer. Impression:               1. High-grade benign-appearing distal sigmoid colon                            stricture. Moderate Sedation:      none Recommendation:           1. Surgical resection                           Discussed with the patient and general surgery                           GI will sign off, but we are available if needed.                            Thanks. Procedure Code(s):        --- Professional ---                           [phone], Sigmoidoscopy, flexible; diagnostic,                            including collection of specimen(s) by brushing or                            washing, when performed (separate procedure) Diagnosis Code(s):        --- Professional ---  R93.3, Abnormal findings on diagnostic imaging of                            other parts of digestive tract CPT copyright 2019 [employer]. All rights reserved. The codes documented in this report are preliminary and upon coder review may  be revised to meet current compliance requirements. Docia Chuck. Henrene Pastor, MD 05/19/2021 10:54:47 AM This report has been signed electronically. Number of Addenda: 0

## 2021-05-19 NOTE — Progress Notes (Signed)
°  Progress Note   Patient: Valerie Fields DXI:[id number] DOB: November 19, 1950 DOA: 05/17/2021     2 DOS: the patient was seen and examined on 05/19/2021   Brief hospital course: 71 year old white female community dwelling Known reflux BMI 37.4 Obstructive sleep apnea NSTEMI 10/18/2019-cath = single-vessel CAD high-grade LAD stenosis Rx DES Combined heart failure systolic diastolic EF 76% at that time with improvement to 55-60% on office visit 04/24/2021 HLD Colonoscopy 2017 diverticulosis--biliary dyskinesia supposed to see general surgery  Patient saw [facility] gastro[facility] 2/16 as per her note-generalized abdominal pain modulated by dicyclomine On exam at that time she had a tense abdomen with tympanitic sounds Told to trial MiraLAX, Cipro Flagyl   Data Sodium 132 K 2.9 BUNs/creatinine 8/0.8 hemoglobin 15 C. difficile neg  Seen by GI/Gen surg--Had flex sig--needs Surgical management later this week  Assessment and Plan:  Recurrent diverticulitis with probable diverticular stricture?  Sigmoid col Discontinued Fentanyl changed to morphine 1 mg every 2 as needed severe pain Appreciate the care coordination between GI/GEN surg Continuing cirpo flagyll iv Flex sig as below shows diverticular stricture-appears patient will need discussion with Dr. Windle Guard regarding operative management Keep only on clear liquid diet as well as Bentyl, psyllium etc. as per GI Profound hypokalemia Continue to replace--potassium in LR 100 cc/H +40 mEq Leukocytosis normalization 2/2 underlying diverticulitis Continue prior to admission IV antibiotics but changed to IV-continue Cipro l Flagyl CAD + LAD stenosis status post DES, HF R EF-HF improved EF HTN Losartan 25 to continue metoprolol XL 25 continue Lipitor Plavix 75 on hold for procedure resume when decided upon by specialists who are doing procedures     Subjective:  Coherent in no distress seen post-procedure No distress Tol clears  no cp fever chills rigors  Physical Exam: Vitals:   05/19/21 1035 05/19/21 1045 05/19/21 1123 05/19/21 1359  BP: 118/74 110/66 110/66 123/64  Pulse: 68 71 71 72  Resp: 17 18  18   Temp:  98.3 F (36.8 C)  98.8 F (37.1 C)  TempSrc:  Oral  Oral  SpO2: 99% 100%  98%  Weight:      Height:       EOMI NCAT thick neck Mallampati 2 CTA B no added sound no rales rhonchi Abdomen still tympanitic cannot really appreciate bowel sounds no rebound no guarding cannot appreciate HSM S1-S2 no murmur ROM intact no lower extremity edema  Data Reviewed:  K still low Sigmoidoscopy 2/19 Impression:1. High-grade benign-appearing distal sigmoid colon                            stricture.  Family Communication: None present at the bedside  Disposition: Status is: Inpatient Remains inpatient appropriate because: Requires scope in a.m. and may be surgery    Planned Discharge Destination:  To be determined at this time    Time spent: 17 minutes  Author: Nita Sells, MD 05/19/2021 4:49 PM  For on call review [email or web address].

## 2021-05-19 NOTE — Anesthesia Procedure Notes (Signed)
Procedure Name: MAC Date/Time: 05/19/2021 9:44 AM Performed by: Niel Hummer, CRNA Pre-anesthesia Checklist: Patient identified, Emergency Drugs available, Suction available and Patient being monitored Oxygen Delivery Method: Simple face mask

## 2021-05-19 NOTE — Anesthesia Preprocedure Evaluation (Addendum)
Anesthesia Evaluation  Patient identified by MRN, date of birth, ID band Patient awake    Reviewed: Allergy & Precautions, NPO status , Patient's Chart, lab work & pertinent test results, reviewed documented beta blocker date and time   Airway Mallampati: III  TM Distance: >3 FB Neck ROM: Full    Dental  (+) Teeth Intact, Dental Advisory Given   Pulmonary asthma , sleep apnea (has been refusing CPAP in hospital) ,    Pulmonary exam normal breath sounds clear to auscultation       Cardiovascular hypertension, Pt. on medications and Pt. on home beta blockers + CAD, + Past MI, + Cardiac Stents (2021) and +CHF (grade 2 diastolic dysfunction)  Normal cardiovascular exam Rhythm:Regular Rate:Normal  Echo 2021: 1. Left ventricular ejection fraction, by estimation, is 55 to 60%. The  left ventricle has normal function. The left ventricle has no regional  wall motion abnormalities. Left ventricular diastolic parameters are  consistent with Grade II diastolic  dysfunction (pseudonormalization).  2. Right ventricular systolic function is normal. The right ventricular  size is normal. There is normal pulmonary artery systolic pressure.  3. Left atrial size was mildly dilated.  4. The mitral valve is abnormal. Trivial mitral valve regurgitation. No  evidence of mitral stenosis. Moderate mitral annular calcification.  5. The aortic valve is normal in structure. Aortic valve regurgitation is  not visualized. No aortic stenosis is present.  6. The inferior vena cava is normal in size with greater than 50%  respiratory variability, suggesting right atrial pressure of 3 mmHg.   Cath 2021: 1. Severe single-vessel CAD with 99% stenosis of mid LAD involving D1 with TIMI-1 flow. 2. Moderately to severely reduced left ventricular systolic function with mid and apical anterior hypokinesis/akinesis.  LVEF ~35%. 3. Mildly elevated left ventricular  filling pressure (LVEDP 20-25 mmHg). 4. Successful PCI to mid LAD/D1 using Resolute Onyx 2.75 x 22 mm drug-eluting stent and kissing balloon inflation in LAD/D1 with 0% residual stenosis and TIMI-3 flow.    Neuro/Psych negative neurological ROS  negative psych ROS   GI/Hepatic Neg liver ROS, GERD  Controlled and Medicated,progressive obstructive process in the sigmoid colon.  Most likely benign stricture secondary to diverticular disease.    Endo/Other  Obesity BMI 38  Renal/GU negative Renal ROS  negative genitourinary   Musculoskeletal negative musculoskeletal ROS (+)   Abdominal (+) + obese,   Peds negative pediatric ROS (+)  Hematology negative hematology ROS (+)   Anesthesia Other Findings   Reproductive/Obstetrics negative OB ROS                            Anesthesia Physical Anesthesia Plan  ASA: 3  Anesthesia Plan: MAC   Post-op Pain Management:    Induction:   PONV Risk Score and Plan: 2 and Propofol infusion and TIVA  Airway Management Planned: Natural Airway and Simple Face Mask  Additional Equipment: None  Intra-op Plan:   Post-operative Plan:   Informed Consent: I have reviewed the patients History and Physical, chart, labs and discussed the procedure including the risks, benefits and alternatives for the proposed anesthesia with the patient or authorized representative who has indicated his/her understanding and acceptance.       Plan Discussed with: CRNA  Anesthesia Plan Comments:         Anesthesia Quick Evaluation

## 2021-05-19 NOTE — Progress Notes (Signed)
Patient refuses CPAP while she is here at the hospital.

## 2021-05-19 NOTE — Transfer of Care (Signed)
Immediate Anesthesia Transfer of Care Note  Patient: Valerie Fields  Procedure(s) Performed: FLEXIBLE SIGMOIDOSCOPY  Patient Location: PACU  Anesthesia Type:MAC  Level of Consciousness: drowsy  Airway & Oxygen Therapy: Patient Spontanous Breathing and Patient connected to face mask oxygen  Post-op Assessment: Report given to RN, Post -op Vital signs reviewed and stable and Patient moving all extremities X 4  Post vital signs: Reviewed and stable  Last Vitals:  Vitals Value Taken Time  BP 97/58   Temp    Pulse 73 05/19/21 1018  Resp 21 05/19/21 1018  SpO2 100 % 05/19/21 1018  Vitals shown include unvalidated device data.  Last Pain:  Vitals:   05/19/21 0834  TempSrc: Temporal  PainSc: 3       Patients Stated Pain Goal: 2 (16/38/45 3646)  Complications: No notable events documented.

## 2021-05-19 NOTE — Anesthesia Postprocedure Evaluation (Signed)
Anesthesia Post Note  Patient: Valerie Fields  Procedure(s) Performed: Teresita     Patient location during evaluation: PACU Anesthesia Type: MAC Level of consciousness: awake and alert Pain management: pain level controlled Vital Signs Assessment: post-procedure vital signs reviewed and stable Respiratory status: spontaneous breathing, nonlabored ventilation and respiratory function stable Cardiovascular status: blood pressure returned to baseline and stable Postop Assessment: no apparent nausea or vomiting Anesthetic complications: no   No notable events documented.  Last Vitals:  Vitals:   05/19/21 1035 05/19/21 1123  BP: 118/74 110/66  Pulse: 68 71  Resp: 17   Temp:    SpO2: 99%     Last Pain:  Vitals:   05/19/21 1035  TempSrc:   PainSc: 0-No pain                 Pervis Hocking

## 2021-05-20 ENCOUNTER — Encounter (HOSPITAL_COMMUNITY)
Admission: EM | Disposition: A | Discharge status: Home / Self Care | Payer: Self-pay | Source: Home / Self Care | Attending: Internal Medicine

## 2021-05-20 ENCOUNTER — Encounter (HOSPITAL_COMMUNITY): Payer: Self-pay | Admitting: Internal Medicine

## 2021-05-20 ENCOUNTER — Inpatient Hospital Stay (HOSPITAL_COMMUNITY): Payer: Medicare HMO | Admitting: Certified Registered Nurse Anesthetist

## 2021-05-20 ENCOUNTER — Other Ambulatory Visit: Payer: Self-pay

## 2021-05-20 DIAGNOSIS — K3532 Acute appendicitis with perforation and localized peritonitis, without abscess: Secondary | ICD-10-CM

## 2021-05-20 DIAGNOSIS — I110 Hypertensive heart disease with heart failure: Secondary | ICD-10-CM

## 2021-05-20 DIAGNOSIS — K56609 Unspecified intestinal obstruction, unspecified as to partial versus complete obstruction: Secondary | ICD-10-CM | POA: Diagnosis not present

## 2021-05-20 DIAGNOSIS — I2510 Atherosclerotic heart disease of native coronary artery without angina pectoris: Secondary | ICD-10-CM

## 2021-05-20 DIAGNOSIS — I5042 Chronic combined systolic (congestive) and diastolic (congestive) heart failure: Secondary | ICD-10-CM

## 2021-05-20 DIAGNOSIS — Z9889 Other specified postprocedural states: Secondary | ICD-10-CM

## 2021-05-20 HISTORY — PX: LAPAROTOMY: SHX154

## 2021-05-20 LAB — MAGNESIUM: Magnesium: 1.8 mg/dL (ref 1.7–2.4)

## 2021-05-20 LAB — COMPREHENSIVE METABOLIC PANEL
ALT: 16 U/L (ref 0–44)
AST: 16 U/L (ref 15–41)
Albumin: 3 g/dL — ABNORMAL LOW (ref 3.5–5.0)
Alkaline Phosphatase: 48 U/L (ref 38–126)
Anion gap: 7 (ref 5–15)
BUN: <5 mg/dL — ABNORMAL LOW (ref 8–23)
CO2: 22 mmol/L (ref 22–32)
Calcium: 8.5 mg/dL — ABNORMAL LOW (ref 8.9–10.3)
Chloride: 104 mmol/L (ref 98–111)
Creatinine, Ser: 0.65 mg/dL (ref 0.44–1.00)
GFR, Estimated: >60 mL/min (ref >60)
Glucose, Bld: 141 mg/dL — ABNORMAL HIGH (ref 70–99)
Potassium: 2.7 mmol/L — CL (ref 3.5–5.1)
Sodium: 133 mmol/L — ABNORMAL LOW (ref 135–145)
Total Bilirubin: 0.7 mg/dL (ref 0.3–1.2)
Total Protein: 6 g/dL — ABNORMAL LOW (ref 6.5–8.1)

## 2021-05-20 LAB — CBC WITH DIFFERENTIAL/PLATELET
Abs Immature Granulocytes: 0.04 10*3/uL (ref 0.00–0.07)
Basophils Absolute: 0 10*3/uL (ref 0.0–0.1)
Basophils Relative: 0 %
Eosinophils Absolute: 0.1 10*3/uL (ref 0.0–0.5)
Eosinophils Relative: 1 %
HCT: 36.7 % (ref 36.0–46.0)
Hemoglobin: 12.5 g/dL (ref 12.0–15.0)
Immature Granulocytes: 0 %
Lymphocytes Relative: 14 %
Lymphs Abs: 1.7 10*3/uL (ref 0.7–4.0)
MCH: 32.2 pg (ref 26.0–34.0)
MCHC: 34.1 g/dL (ref 30.0–36.0)
MCV: 94.6 fL (ref 80.0–100.0)
Monocytes Absolute: 1 10*3/uL (ref 0.1–1.0)
Monocytes Relative: 8 %
Neutro Abs: 9.3 10*3/uL — ABNORMAL HIGH (ref 1.7–7.7)
Neutrophils Relative %: 77 %
Platelets: 298 10*3/uL (ref 150–400)
RBC: 3.88 MIL/uL (ref 3.87–5.11)
RDW: 13.4 % (ref 11.5–15.5)
WBC: 12.1 10*3/uL — ABNORMAL HIGH (ref 4.0–10.5)
nRBC: 0 % (ref 0.0–0.2)

## 2021-05-20 LAB — URINE CULTURE: Culture: >=100000 — AB

## 2021-05-20 SURGERY — LAPAROTOMY, EXPLORATORY
Anesthesia: General | Site: Abdomen

## 2021-05-20 MED ORDER — DEXAMETHASONE SODIUM PHOSPHATE 10 MG/ML IJ SOLN
INTRAMUSCULAR | Status: DC | PRN
  Administered 2021-05-20: 10 mg via INTRAVENOUS

## 2021-05-20 MED ORDER — SUCCINYLCHOLINE CHLORIDE 200 MG/10ML IV SOSY
PREFILLED_SYRINGE | INTRAVENOUS | Status: AC
  Filled 2021-05-20: qty 10

## 2021-05-20 MED ORDER — OXYCODONE HCL 5 MG PO TABS
5.0000 mg | ORAL_TABLET | ORAL | Status: DC | PRN
  Administered 2021-05-22 – 2021-05-30 (×11): 10 mg via ORAL
  Filled 2021-05-20 (×11): qty 2

## 2021-05-20 MED ORDER — PHENYLEPHRINE HCL-NACL 20-0.9 MG/250ML-% IV SOLN
INTRAVENOUS | Status: AC
  Filled 2021-05-20: qty 250

## 2021-05-20 MED ORDER — METHOCARBAMOL 1000 MG/10ML IJ SOLN
500.0000 mg | Freq: Four times a day (QID) | INTRAVENOUS | Status: DC | PRN
  Filled 2021-05-20: qty 5

## 2021-05-20 MED ORDER — PHENYLEPHRINE 40 MCG/ML (10ML) SYRINGE FOR IV PUSH (FOR BLOOD PRESSURE SUPPORT)
PREFILLED_SYRINGE | INTRAVENOUS | Status: AC
  Filled 2021-05-20: qty 10

## 2021-05-20 MED ORDER — PROPOFOL 10 MG/ML IV BOLUS
INTRAVENOUS | Status: AC
Start: 2021-05-20 — End: ?
  Filled 2021-05-20: qty 20

## 2021-05-20 MED ORDER — PROPOFOL 10 MG/ML IV BOLUS
INTRAVENOUS | Status: DC | PRN
  Administered 2021-05-20: 150 mg via INTRAVENOUS

## 2021-05-20 MED ORDER — ACETAMINOPHEN 10 MG/ML IV SOLN
1000.0000 mg | Freq: Four times a day (QID) | INTRAVENOUS | Status: AC
  Administered 2021-05-21 (×4): 1000 mg via INTRAVENOUS
  Filled 2021-05-20 (×5): qty 100

## 2021-05-20 MED ORDER — ONDANSETRON HCL 4 MG/2ML IJ SOLN
INTRAMUSCULAR | Status: DC | PRN
  Administered 2021-05-20: 4 mg via INTRAVENOUS

## 2021-05-20 MED ORDER — POTASSIUM CHLORIDE 10 MEQ/100ML IV SOLN
10.0000 meq | INTRAVENOUS | Status: AC
  Administered 2021-05-20 (×5): 10 meq via INTRAVENOUS
  Filled 2021-05-20: qty 100

## 2021-05-20 MED ORDER — LIDOCAINE 2% (20 MG/ML) 5 ML SYRINGE
INTRAMUSCULAR | Status: DC | PRN
  Administered 2021-05-20: 100 mg via INTRAVENOUS

## 2021-05-20 MED ORDER — SODIUM CHLORIDE (PF) 0.9 % IJ SOLN
INTRAMUSCULAR | Status: AC
  Filled 2021-05-20: qty 10

## 2021-05-20 MED ORDER — EPHEDRINE 5 MG/ML INJ
INTRAVENOUS | Status: AC
  Filled 2021-05-20: qty 5

## 2021-05-20 MED ORDER — PROPOFOL 10 MG/ML IV BOLUS
INTRAVENOUS | Status: AC
  Filled 2021-05-20: qty 20

## 2021-05-20 MED ORDER — PHENYLEPHRINE 40 MCG/ML (10ML) SYRINGE FOR IV PUSH (FOR BLOOD PRESSURE SUPPORT)
PREFILLED_SYRINGE | INTRAVENOUS | Status: DC | PRN
  Administered 2021-05-20 (×4): 80 ug via INTRAVENOUS
  Administered 2021-05-20: 120 ug via INTRAVENOUS
  Administered 2021-05-20: 160 ug via INTRAVENOUS
  Administered 2021-05-20: 120 ug via INTRAVENOUS
  Administered 2021-05-20: 80 ug via INTRAVENOUS

## 2021-05-20 MED ORDER — SUCCINYLCHOLINE CHLORIDE 200 MG/10ML IV SOSY
PREFILLED_SYRINGE | INTRAVENOUS | Status: DC | PRN
Start: 2021-05-20 — End: 2021-05-20
  Administered 2021-05-20: 120 mg via INTRAVENOUS

## 2021-05-20 MED ORDER — FENTANYL CITRATE (PF) 250 MCG/5ML IJ SOLN
INTRAMUSCULAR | Status: AC
  Filled 2021-05-20: qty 5

## 2021-05-20 MED ORDER — ROCURONIUM BROMIDE 10 MG/ML (PF) SYRINGE
PREFILLED_SYRINGE | INTRAVENOUS | Status: AC
  Filled 2021-05-20: qty 10

## 2021-05-20 MED ORDER — ROCURONIUM BROMIDE 10 MG/ML (PF) SYRINGE
PREFILLED_SYRINGE | INTRAVENOUS | Status: DC | PRN
  Administered 2021-05-20: 40 mg via INTRAVENOUS
  Administered 2021-05-20 (×2): 20 mg via INTRAVENOUS

## 2021-05-20 MED ORDER — SUGAMMADEX SODIUM 200 MG/2ML IV SOLN
INTRAVENOUS | Status: DC | PRN
  Administered 2021-05-20: 200 mg via INTRAVENOUS

## 2021-05-20 MED ORDER — LACTATED RINGERS IV SOLN: INTRAVENOUS | Status: DC

## 2021-05-20 MED ORDER — FENTANYL CITRATE (PF) 100 MCG/2ML IJ SOLN
INTRAMUSCULAR | Status: DC | PRN
  Administered 2021-05-20 (×3): 50 ug via INTRAVENOUS
  Administered 2021-05-20: 100 ug via INTRAVENOUS

## 2021-05-20 MED ORDER — DEXAMETHASONE SODIUM PHOSPHATE 10 MG/ML IJ SOLN
INTRAMUSCULAR | Status: AC
  Filled 2021-05-20: qty 1

## 2021-05-20 MED ORDER — SODIUM CHLORIDE 0.9 % IV SOLN
2.0000 g | INTRAVENOUS | Status: AC
  Administered 2021-05-20: 2 g via INTRAVENOUS
  Filled 2021-05-20: qty 2

## 2021-05-20 MED ORDER — MIDAZOLAM HCL 2 MG/2ML IJ SOLN
INTRAMUSCULAR | Status: DC | PRN
  Administered 2021-05-20: 2 mg via INTRAVENOUS

## 2021-05-20 MED ORDER — MIDAZOLAM HCL 2 MG/2ML IJ SOLN
INTRAMUSCULAR | Status: AC
  Filled 2021-05-20: qty 2

## 2021-05-20 MED ORDER — FENTANYL CITRATE PF 50 MCG/ML IJ SOSY
25.0000 ug | PREFILLED_SYRINGE | INTRAMUSCULAR | Status: DC | PRN
  Administered 2021-05-20 (×3): 50 ug via INTRAVENOUS

## 2021-05-20 MED ORDER — ONDANSETRON HCL 4 MG/2ML IJ SOLN
INTRAMUSCULAR | Status: AC
  Filled 2021-05-20: qty 2

## 2021-05-20 MED ORDER — LIDOCAINE HCL (PF) 2 % IJ SOLN
INTRAMUSCULAR | Status: AC
  Filled 2021-05-20: qty 5

## 2021-05-20 MED ORDER — MAGNESIUM SULFATE 2 GM/50ML IV SOLN
2.0000 g | Freq: Once | INTRAVENOUS | Status: AC
  Administered 2021-05-20: 2 g via INTRAVENOUS
  Filled 2021-05-20: qty 50

## 2021-05-20 MED ORDER — 0.9 % SODIUM CHLORIDE (POUR BTL) OPTIME
TOPICAL | Status: DC | PRN
  Administered 2021-05-20: 1000 mL
  Administered 2021-05-20: 6000 mL
  Administered 2021-05-20: 1000 mL

## 2021-05-20 MED ORDER — MORPHINE SULFATE (PF) 2 MG/ML IV SOLN
2.0000 mg | INTRAVENOUS | Status: DC | PRN
  Administered 2021-05-20 – 2021-05-22 (×3): 2 mg via INTRAVENOUS
  Filled 2021-05-20 (×3): qty 1

## 2021-05-20 MED ORDER — EPHEDRINE SULFATE-NACL 50-0.9 MG/10ML-% IV SOSY
PREFILLED_SYRINGE | INTRAVENOUS | Status: DC | PRN
  Administered 2021-05-20: 10 mg via INTRAVENOUS

## 2021-05-20 MED ORDER — FENTANYL CITRATE PF 50 MCG/ML IJ SOSY
PREFILLED_SYRINGE | INTRAMUSCULAR | Status: AC
Start: 2021-05-20 — End: 2021-05-21
  Filled 2021-05-20: qty 3

## 2021-05-20 MED ORDER — HYDROMORPHONE HCL 2 MG/ML IJ SOLN
INTRAMUSCULAR | Status: AC
  Filled 2021-05-20: qty 1

## 2021-05-20 MED ORDER — CHLORHEXIDINE GLUCONATE 0.12 % MT SOLN
15.0000 mL | Freq: Once | OROMUCOSAL | Status: AC
  Administered 2021-05-20: 15 mL via OROMUCOSAL

## 2021-05-20 SURGICAL SUPPLY — 53 items
BAG COUNTER SPONGE SURGICOUNT (BAG) IMPLANT
BARRIER SKIN 2 3/4 (OSTOMY) ×2 IMPLANT
BARRIER SKIN OD2.25 2 3/4 FLNG (OSTOMY) IMPLANT
BLADE EXTENDED COATED 6.5IN (ELECTRODE) ×1 IMPLANT
BNDG GAUZE ELAST 4 BULKY (GAUZE/BANDAGES/DRESSINGS) ×1 IMPLANT
CHLORAPREP W/TINT 26 (MISCELLANEOUS) IMPLANT
COVER MAYO STAND STRL (DRAPES) ×2 IMPLANT
COVER SURGICAL LIGHT HANDLE (MISCELLANEOUS) ×2 IMPLANT
DRAIN CHANNEL 19F RND (DRAIN) IMPLANT
DRAPE LAPAROSCOPIC ABDOMINAL (DRAPES) ×2 IMPLANT
DRSG OPSITE POSTOP 4X10 (GAUZE/BANDAGES/DRESSINGS) IMPLANT
DRSG OPSITE POSTOP 4X6 (GAUZE/BANDAGES/DRESSINGS) IMPLANT
DRSG OPSITE POSTOP 4X8 (GAUZE/BANDAGES/DRESSINGS) IMPLANT
DRSG PAD ABDOMINAL 8X10 ST (GAUZE/BANDAGES/DRESSINGS) ×1 IMPLANT
ELECT REM PT RETURN 15FT ADLT (MISCELLANEOUS) ×2 IMPLANT
EVACUATOR SILICONE 100CC (DRAIN) IMPLANT
GLOVE SURG ENC MOIS LTX SZ6 (GLOVE) ×4 IMPLANT
GLOVE SURG MICRO LTX SZ6 (GLOVE) ×2 IMPLANT
GLOVE SURG UNDER LTX SZ6.5 (GLOVE) ×4 IMPLANT
GOWN STRL REUS W/TWL LRG LVL3 (GOWN DISPOSABLE) ×2 IMPLANT
GOWN STRL REUS W/TWL XL LVL3 (GOWN DISPOSABLE) ×2 IMPLANT
KIT TURNOVER KIT A (KITS) IMPLANT
PACK GENERAL/GYN (CUSTOM PROCEDURE TRAY) ×2 IMPLANT
POUCH OSTOMY 2 3/4  H 3804 (WOUND CARE) ×1
POUCH OSTOMY 2 PC DRNBL 2.75 (WOUND CARE) IMPLANT
RELOAD PROXIMATE 100 BLUE (MISCELLANEOUS) ×1 IMPLANT
RELOAD PROXIMATE 100MM BLUE (MISCELLANEOUS) ×1
RELOAD STAPLE 100 3.8 BLU REG (MISCELLANEOUS) IMPLANT
RETRACTOR WND ALEXIS 25 LRG (MISCELLANEOUS) IMPLANT
RTRCTR C-SECT PINK 34CM XLRG (MISCELLANEOUS) ×1 IMPLANT
RTRCTR WOUND ALEXIS 25CM LRG (MISCELLANEOUS) ×2
SPONGE T-LAP 18X18 ~~LOC~~+RFID (SPONGE) IMPLANT
STAPLER GUN LINEAR PROX 60 (STAPLE) ×1 IMPLANT
STAPLER PROXIMATE 100MM BLUE (MISCELLANEOUS) ×1 IMPLANT
STAPLER PROXIMATE 75MM BLUE (STAPLE) ×3 IMPLANT
STAPLER VISISTAT 35W (STAPLE) IMPLANT
SUT ETHILON 3 0 PS 1 (SUTURE) IMPLANT
SUT MNCRL AB 4-0 PS2 18 (SUTURE) IMPLANT
SUT NOVA T20/GS 25 (SUTURE) IMPLANT
SUT PDS AB 1 TP1 96 (SUTURE) ×2 IMPLANT
SUT SILK 2 0 (SUTURE) ×1
SUT SILK 2 0 SH CR/8 (SUTURE) ×2 IMPLANT
SUT SILK 2-0 18XBRD TIE 12 (SUTURE) ×1 IMPLANT
SUT SILK 3 0 (SUTURE) ×1
SUT SILK 3 0 SH CR/8 (SUTURE) ×2 IMPLANT
SUT SILK 3-0 18XBRD TIE 12 (SUTURE) ×1 IMPLANT
SUT VIC AB 3-0 SH 8-18 (SUTURE) ×3 IMPLANT
TAPE CLOTH SURG 6X10 WHT LF (GAUZE/BANDAGES/DRESSINGS) ×1 IMPLANT
TOWEL OR 17X26 10 PK STRL BLUE (TOWEL DISPOSABLE) IMPLANT
TOWEL OR NON WOVEN STRL DISP B (DISPOSABLE) ×2 IMPLANT
TRAY FOLEY MTR SLVR 14FR STAT (SET/KITS/TRAYS/PACK) ×2 IMPLANT
TRAY FOLEY MTR SLVR 16FR STAT (SET/KITS/TRAYS/PACK) ×2 IMPLANT
YANKAUER SUCT BULB TIP 10FT TU (MISCELLANEOUS) ×1 IMPLANT

## 2021-05-20 NOTE — Anesthesia Procedure Notes (Signed)
Procedure Name: Intubation Date/Time: 05/20/2021 1:54 PM Performed by: Genelle Bal, CRNA Pre-anesthesia Checklist: Patient identified, Emergency Drugs available, Suction available and Patient being monitored Patient Re-evaluated:Patient Re-evaluated prior to induction Oxygen Delivery Method: Circle system utilized Preoxygenation: Pre-oxygenation with 100% oxygen Induction Type: IV induction Ventilation: Mask ventilation without difficulty Laryngoscope Size: Miller and 2 Grade View: Grade I Tube type: Oral Tube size: 7.0 mm Number of attempts: 1 Airway Equipment and Method: Stylet Placement Confirmation: ETT inserted through vocal cords under direct vision, positive ETCO2 and breath sounds checked- equal and bilateral Secured at: 21 cm Tube secured with: Tape Dental Injury: Teeth and Oropharynx as per pre-operative assessment

## 2021-05-20 NOTE — Anesthesia Postprocedure Evaluation (Signed)
Anesthesia Post Note  Patient: Valerie Fields  Procedure(s) Performed: EXPLORATORY LAPAROTOMY WITH RIGHT HEMI  COLECTOMY; CREATION OF LOOP SIGMOID COLOSTOMY (Abdomen)     Patient location during evaluation: PACU Anesthesia Type: General Level of consciousness: sedated and patient cooperative Pain management: pain level controlled Vital Signs Assessment: post-procedure vital signs reviewed and stable Respiratory status: spontaneous breathing Cardiovascular status: stable Anesthetic complications: no   No notable events documented.  Last Vitals:  Vitals:   05/20/21 2030 05/20/21 2111  BP: 100/66 (!) 96/59  Pulse: 98 99  Resp: 20 18  Temp:  36.9 C  SpO2: 95% 95%    Last Pain:  Vitals:   05/20/21 2117  TempSrc:   PainSc: [place]

## 2021-05-20 NOTE — Progress Notes (Signed)
1 Day Post-Op   Subjective/Chief Complaint: Remains very uncomfortable, bloated. Having some liquid stool output. No appetite. Has had one sip of water a couple hours ago, but otherwise has been essentially npo.    Objective: Vital signs in last 24 hours: Temp:  [98.3 F (36.8 C)-100.7 F (38.2 C)] 98.5 F (36.9 C) (02/20 0521) Pulse Rate:  [68-91] 91 (02/20 0521) Resp:  [17-26] 18 (02/20 0521) BP: (97-133)/(58-86) 128/73 (02/20 0521) SpO2:  [96 %-100 %] 96 % (02/20 0521) Last BM Date : 05/18/21  Intake/Output from previous day: 02/19 0701 - 02/20 0700 In: 2718 [P.O.:600; I.V.:1518; IV Piggyback:600] Out: -  Intake/Output this shift: No intake/output data recorded.  Alert, calm Unlabored respirations Abdomen is distended, minimally tender, no peritonitis Ext [facility]  Lab Results:  Recent Labs    05/18/21 0740 05/20/21 0410  WBC 11.2* 12.1*  HGB 13.3 12.5  HCT 39.3 36.7  PLT 322 298   BMET Recent Labs    05/19/21 0436 05/20/21 0410  NA 135 133*  K 3.1* 2.7*  CL 107 104  CO2 22 22  GLUCOSE 113* 141*  BUN <5* <5*  CREATININE 0.70 0.65  CALCIUM 8.7* 8.5*   PT/INR No results for input(s): LABPROT, INR in the last 72 hours. ABG No results for input(s): PHART, HCO3 in the last 72 hours.  Invalid input(s): PCO2, PO2  Studies/Results: No results found.  Anti-infectives: Anti-infectives (From admission, onward)    Start     Dose/Rate Route Frequency Ordered Stop   05/20/21 0845  cefoTEtan (CEFOTAN) 2 g in sodium chloride 0.9 % 100 mL IVPB       Note to Pharmacy: Pharmacy to adjust dose as needed   2 g 200 mL/hr over 30 Minutes Intravenous On call to O.R. 05/20/21 0753 05/21/21 0559   05/18/21 1000  ciprofloxacin (CIPRO) IVPB 400 mg        400 mg 200 mL/hr over 60 Minutes Intravenous Every 12 hours 05/18/21 0556     05/18/21 1000  metroNIDAZOLE (FLAGYL) IVPB 500 mg        500 mg 100 mL/hr over 60 Minutes Intravenous Every 12 hours 05/18/21 0556      05/17/21 2200  ciprofloxacin (CIPRO) IVPB 400 mg        400 mg 200 mL/hr over 60 Minutes Intravenous  Once 05/17/21 2149 05/17/21 2320   05/17/21 2200  metroNIDAZOLE (FLAGYL) IVPB 500 mg        500 mg 100 mL/hr over 60 Minutes Intravenous  Once 05/17/21 2149 05/17/21 2315       Assessment/Plan:  Sigmoid obstruction, benign appearing on flex sig yesterday. She remains quite distended and I do not think she will really tolerate a prep. I recommend proceeding with sigmoid colectomy with end colostomy, and I discussed this with her including indication for open surgery and colostomy, anticipated recovery and risks of bleeding, infection, pain, scarring, injury to intraabdominal or retroperitoneal structures, delayed infection or intraabdominal abscess, wound healing problems or hernia, ileus, bowel obstruction, as well as general cardiovascular/pulmonary/thromboembolic risks. Questions welcomed and answered to her satisfaction, and she wishes to proceed. Will proceed to OR today.   LOS: 3 days    Clovis Riley 05/20/2021

## 2021-05-20 NOTE — TOC Initial Note (Signed)
Transition of Care [facility]) - Initial/Assessment Note    Patient Details  Name: Katherina Wimer MRN: [id number] Date of Birth: May 16, 1950  Transition of Care [facility]) CM/SW Contact:    Joaquin Courts, RN Phone Number: 05/20/2021, 12:16 PM  Clinical Narrative:                 TOC reviewed chart, noted plans for surgery today.  Will follow at a distance for possible needs.  Expected Discharge Plan: Home/Self Care Barriers to Discharge: Continued Medical Work up   Patient Goals and CMS Choice Patient states their goals for this hospitalization and ongoing recovery are:: to get better      Expected Discharge Plan and Services Expected Discharge Plan: Home/Self Care   Discharge Planning Services: CM Consult                                          Prior Living Arrangements/Services     Patient language and need for interpreter reviewed:: Yes Do you feel safe going back to the place where you live?: Yes            Criminal Activity/Legal Involvement Pertinent to Current Situation/Hospitalization: No - Comment as needed  Activities of Daily Living Home Assistive Devices/Equipment: Eyeglasses ADL Screening (condition at time of admission) Patient's cognitive ability adequate to safely complete daily activities?: Yes Is the patient deaf or have difficulty hearing?: No Does the patient have difficulty seeing, even when wearing glasses/contacts?: No Does the patient have difficulty concentrating, remembering, or making decisions?: No Patient able to express need for assistance with ADLs?: Yes Does the patient have difficulty dressing or bathing?: No Independently performs ADLs?: Yes (appropriate for developmental age) Does the patient have difficulty walking or climbing stairs?: No Weakness of Legs: None Weakness of Arms/Hands: None  Permission Sought/Granted                  Emotional Assessment       Orientation: : Oriented to Self, Oriented to  Place, Oriented to  Time, Oriented to Situation Alcohol / Substance Use: Not Applicable Psych Involvement: No (comment)  Admission diagnosis:  Hypokalemia [E87.6] Colitis [K52.9] Colonic obstruction ([place]) [K56.609] Patient Active Problem List   Diagnosis Date Noted   Stricture of sigmoid colon ([place])    Colonic obstruction ([place]) 05/17/2021   Primary hypertension 05/17/2021   CAD (coronary artery disease), native coronary artery 05/17/2021   Hypokalemia 05/17/2021   OSA (obstructive sleep apnea) 06/27/2020   Chronic combined systolic and diastolic CHF (congestive heart failure) ([place]) 10/15/2019   Hyperlipidemia 10/15/2019   GERD (gastroesophageal reflux disease) 10/15/2019   Ischemic cardiomyopathy 10/15/2019   PCP:  Marda Stalker, PA-C Pharmacy:   [id number] [address] [place] [address] Phone: [phone] Fax: [phone]  [facility] - [place], [place] - [address] [place] [address] Phone: [phone] Fax: [phone]     Social Determinants of Health (SDOH) Interventions    Readmission Risk Interventions No flowsheet data found.

## 2021-05-20 NOTE — Consult Note (Signed)
[place] Nurse ostomy consult note: POD 0 Consult received for new ostomy.  Patient still in PACU this evening. Will follow for ostomy pouching, management and education as appropriate.  [facility] nursing team will follow, and will remain available to this patient, the nursing and medical teams.  Thanks, Maudie Flakes, MSN, RN, [place], Arther Abbott  Pager# [phone]

## 2021-05-20 NOTE — Anesthesia Preprocedure Evaluation (Signed)
Anesthesia Evaluation  Patient identified by MRN, date of birth, ID band Patient awake    Reviewed: Allergy & Precautions, NPO status , Patient's Chart, lab work & pertinent test results  Airway Mallampati: II  TM Distance: >3 FB     Dental   Pulmonary asthma , sleep apnea ,    breath sounds clear to auscultation       Cardiovascular hypertension, + CAD, + Past MI and +CHF   Rhythm:Regular Rate:Normal     Neuro/Psych negative neurological ROS     GI/Hepatic Neg liver ROS, GERD  ,  Endo/Other  negative endocrine ROS  Renal/GU      Musculoskeletal   Abdominal   Peds  Hematology   Anesthesia Other Findings   Reproductive/Obstetrics                             Anesthesia Physical Anesthesia Plan  ASA: 3  Anesthesia Plan: General   Post-op Pain Management:    Induction: Intravenous  PONV Risk Score and Plan: 3  Airway Management Planned: Oral ETT  Additional Equipment:   Intra-op Plan:   Post-operative Plan: Possible Post-op intubation/ventilation  Informed Consent: I have reviewed the patients History and Physical, chart, labs and discussed the procedure including the risks, benefits and alternatives for the proposed anesthesia with the patient or authorized representative who has indicated his/her understanding and acceptance.     Dental advisory given  Plan Discussed with: CRNA, Anesthesiologist and Surgeon  Anesthesia Plan Comments:         Anesthesia Quick Evaluation

## 2021-05-20 NOTE — Progress Notes (Signed)
°  Progress Note   Patient: Valerie Fields JTT:[id number] DOB: 1951/03/01 DOA: 05/17/2021     3 DOS: the patient was seen and examined on 05/20/2021   Brief hospital course: 71 year old white female community dwelling Known reflux BMI 37.4 Obstructive sleep apnea NSTEMI 10/18/2019-cath = single-vessel CAD high-grade LAD stenosis Rx DES Combined heart failure systolic diastolic EF 00% at that time with improvement to 55-60% on office visit 04/24/2021 HLD Colonoscopy 2017 diverticulosis--biliary dyskinesia supposed to see general surgery  Patient saw [facility] gastroenterology 2/16 as per her note-generalized abdominal pain modulated by dicyclomine On exam at that time she had a tense abdomen with tympanitic sounds Told to trial MiraLAX, Cipro Flagyl   Data Sodium 132 K 2.9 BUNs/creatinine 8/0.8 hemoglobin 15 C. difficile neg  Seen by GI/Gen surg--Had flex sig--needs Surgical management later this week  Assessment and Plan:  Recurrent diverticulitis with probable diverticular stricture?  Sigmoid col Discontinued Fentanyl changed to morphine 1 mg every 2 as needed severe pain Appreciate the care coordination between GI/GEN surg Continuing cirpo flagyll iv Flex sig as below shows diverticular stricture Colectomy today per general surgery-appreciate general surgery input Plavix 75 on hold for procedure resume when decided upon by specialists who are doing procedures Profound hypokalemia Continue to replace--potassium in LR 100 cc/H +40 mEq giving runs of potassium today as well as his potassium dropped to 2.7-magnesium slightly low so we will replace also Leukocytosis normalization 2/2 underlying diverticulitis Continue prior to admission IV antibiotics but changed to IV-continue Cipro l Flagyl CAD + LAD stenosis status post DES, HF R EF-HF improved EF HTN Losartan 25 to continue metoprolol XL 25 continue Lipitor      Subjective:   For surgery later  today Understands implications risk benefits and alternatives according to the excellent discussion coordinated by Dr. Kae Heller  Physical Exam: Vitals:   05/19/21 1123 05/19/21 1359 05/19/21 2030 05/20/21 0521  BP: 110/66 123/64 129/70 128/73  Pulse: 71 72 72 91  Resp:  18 18 18   Temp:  98.8 F (37.1 C) (!) 100.7 F (38.2 C) 98.5 F (36.9 C)  TempSrc:  Oral Oral Oral  SpO2:  98% 100% 96%  Weight:      Height:       EOMI NCAT thick neck Mallampati 2 CTA B no added sound no rales rhonchi Abdomen still tympanitic S1-S2 no murmur ROM intact no lower extremity edema  Data Reviewed:  K 2.7 mag 1.8 Sigmoidoscopy 2/19 WBC 12 Impression:1. High-grade benign-appearing distal sigmoid colon                            stricture.  Family Communication: None present at the bedside  Disposition: Status is: Inpatient Remains inpatient appropriate because: Requires  surgery    Planned Discharge Destination:  To be determined at this time    Time spent: 27 minutes  Author: Nita Sells, MD 05/20/2021 10:51 AM  For on call review [email or web address].

## 2021-05-20 NOTE — Op Note (Signed)
Operative Note  Valerie Fields  [id number]  [id number]  05/20/2021   Surgeon: Romana Juniper MD   Assistant: Obie Dredge, PA-C   Procedure performed: Exploratory laparotomy, right hemicolectomy, loop sigmoid colostomy   Preop diagnosis: Colonic stricture Post-op diagnosis/intraop findings: Very distal colonic stricture extremely deep in the pelvis, perforated cecum  Infection present at time of surgery: yes, feculent peritonitis   Specimens: right hemicolectomy Retained items: no  EBL: 68EH Complications: none   Description of procedure: After obtaining informed consent the patient was taken to the operating room and placed supine on operating room table where general endotracheal anesthesia was initiated, preoperative antibiotics were administered, SCDs applied, and a formal timeout was performed.  The abdomen was prepped and draped in usual sterile fashion and a midline laparotomy created followed by insertion of an [place] wound protector.  Bloody ascites was encountered on initial entry and an extremely dilated (more than 16 cm) cecum visualized in the anterior midline.  The cecum perforated with minimal manipulation and there was spillage of copious liquid stool and undigested food matter.  The perforation was temporarily occluded with a pursestring 2-0 silk.  We turned to the left lower quadrant and the sigmoid colon which was tensely distended was identified and followed distally.  The stricture is so distally that it is not palpable from the peritoneal cavity and will likely require a low anterior resection.  We returned to the perforated cecum.  The lateral attachments were divided medializing the right colon and taking down the hepatic flexure.  A segment of proximal transverse was divided with a 100 mm blue load Endo GIA stapler and the terminal ileum was divided just in centimeters proximal to the ileocecal valve with a 75 mm blue load Endo GIA.  The intervening mesentery  was divided with the LigaSure.  Hemostasis was ensured.  A side-to-side functional end-to-end ileocolonic anastomosis was created with a 75 mm Endo GIA blue load.  The common enterotomy was closed with a TX 60 blue load.  This last staple load was oversewn with interrupted seromuscular 3-0 Vicryl's.  An additional 3-0 Vicryl seromuscular stitch was placed at the apex of the staple line.  On completion the anastomosis is widely patent, appears well-perfused, and is tension-free.  Omentum was brought down over this.  We then turned back to the left hemiabdomen.  The sigmoid and descending colon were medialized in order to mobilize it for colostomy.  A colostomy site was created in the left upper quadrant and a loop of sigmoid colon brought through with some difficulty, with a red rubber catheter through the mesentery to bolster this.  The abdomen was irrigated with at least 5 L of warm sterile saline and the effluent was clear.  All visible contaminant was evacuated.  The fascia was closed with running looped #1 PDS starting at either end and tying centrally. The colostomy was matured with interrupted 3-0 Vicryl sutures and on digital palpation was patent both proximally and distally.  Sterile dressings were applied to the midline and a colostomy appliance was placed.  The patient was then awakened, extubated and taken to PACU in stable condition.    All counts were correct at the completion of the case.

## 2021-05-20 NOTE — Progress Notes (Signed)
Spoke to [place], RN: received report for surgery today.  She is working on getting the 60meq KCL runs in, 5 are ordered.

## 2021-05-20 NOTE — Transfer of Care (Signed)
Immediate Anesthesia Transfer of Care Note  Patient: Tasneem Cormier  Procedure(s) Performed: EXPLORATORY LAPAROTOMY WITH RIGHT HEMI  COLECTOMY; CREATION OF LOOP SIGMOID COLOSTOMY (Abdomen)  Patient Location: PACU  Anesthesia Type:General  Level of Consciousness: awake, alert , oriented and patient cooperative  Airway & Oxygen Therapy: Patient Spontanous Breathing and Patient connected to face mask oxygen  Post-op Assessment: Report given to RN and Post -op Vital signs reviewed and stable  Post vital signs: Reviewed and stable  Last Vitals:  Vitals Value Taken Time  BP 111/61 05/20/21 1707  Temp    Pulse 95 05/20/21 1711  Resp 25 05/20/21 1711  SpO2 84 % 05/20/21 1711  Vitals shown include unvalidated device data.  Last Pain:  Vitals:   05/20/21 1213  TempSrc:   PainSc: 1       Patients Stated Pain Goal: 0 (86/82/57 4935)  Complications: No notable events documented.

## 2021-05-21 ENCOUNTER — Encounter (HOSPITAL_COMMUNITY): Payer: Self-pay | Admitting: Surgery

## 2021-05-21 DIAGNOSIS — K56609 Unspecified intestinal obstruction, unspecified as to partial versus complete obstruction: Secondary | ICD-10-CM | POA: Diagnosis not present

## 2021-05-21 LAB — COMPREHENSIVE METABOLIC PANEL
ALT: 16 U/L (ref 0–44)
ALT: 15 U/L (ref 0–44)
AST: 28 U/L (ref 15–41)
AST: 30 U/L (ref 15–41)
Albumin: 2.1 g/dL — ABNORMAL LOW (ref 3.5–5.0)
Albumin: 2 g/dL — ABNORMAL LOW (ref 3.5–5.0)
Alkaline Phosphatase: 34 U/L — ABNORMAL LOW (ref 38–126)
Alkaline Phosphatase: 32 U/L — ABNORMAL LOW (ref 38–126)
Anion gap: 9 (ref 5–15)
Anion gap: 7 (ref 5–15)
BUN: 10 mg/dL (ref 8–23)
BUN: 16 mg/dL (ref 8–23)
CO2: 18 mmol/L — ABNORMAL LOW (ref 22–32)
CO2: 20 mmol/L — ABNORMAL LOW (ref 22–32)
Calcium: 8.5 mg/dL — ABNORMAL LOW (ref 8.9–10.3)
Calcium: 8.3 mg/dL — ABNORMAL LOW (ref 8.9–10.3)
Chloride: 107 mmol/L (ref 98–111)
Chloride: 107 mmol/L (ref 98–111)
Creatinine, Ser: 1.89 mg/dL — ABNORMAL HIGH (ref 0.44–1.00)
Creatinine, Ser: 2.04 mg/dL — ABNORMAL HIGH (ref 0.44–1.00)
GFR, Estimated: 28 mL/min — ABNORMAL LOW (ref >60)
GFR, Estimated: 26 mL/min — ABNORMAL LOW (ref >60)
Glucose, Bld: 189 mg/dL — ABNORMAL HIGH (ref 70–99)
Glucose, Bld: 142 mg/dL — ABNORMAL HIGH (ref 70–99)
Potassium: 5.7 mmol/L — ABNORMAL HIGH (ref 3.5–5.1)
Potassium: 4.5 mmol/L (ref 3.5–5.1)
Sodium: 134 mmol/L — ABNORMAL LOW (ref 135–145)
Sodium: 134 mmol/L — ABNORMAL LOW (ref 135–145)
Total Bilirubin: 0.8 mg/dL (ref 0.3–1.2)
Total Bilirubin: 0.3 mg/dL (ref 0.3–1.2)
Total Protein: 4.6 g/dL — ABNORMAL LOW (ref 6.5–8.1)
Total Protein: 4.5 g/dL — ABNORMAL LOW (ref 6.5–8.1)

## 2021-05-21 LAB — CBC WITH DIFFERENTIAL/PLATELET
Abs Immature Granulocytes: 0.5 10*3/uL — ABNORMAL HIGH (ref 0.00–0.07)
Band Neutrophils: 7 %
Basophils Absolute: 0 10*3/uL (ref 0.0–0.1)
Basophils Relative: 0 %
Eosinophils Absolute: 0 10*3/uL (ref 0.0–0.5)
Eosinophils Relative: 0 %
HCT: 48.8 % — ABNORMAL HIGH (ref 36.0–46.0)
Hemoglobin: 15.9 g/dL — ABNORMAL HIGH (ref 12.0–15.0)
Lymphocytes Relative: 10 %
Lymphs Abs: 1.2 10*3/uL (ref 0.7–4.0)
MCH: 32.5 pg (ref 26.0–34.0)
MCHC: 32.6 g/dL (ref 30.0–36.0)
MCV: 99.8 fL (ref 80.0–100.0)
Metamyelocytes Relative: 2 %
Monocytes Absolute: 0.3 10*3/uL (ref 0.1–1.0)
Monocytes Relative: 3 %
Myelocytes: 2 %
Neutro Abs: 9.5 10*3/uL — ABNORMAL HIGH (ref 1.7–7.7)
Neutrophils Relative %: 76 %
Platelets: 345 10*3/uL (ref 150–400)
RBC: 4.89 MIL/uL (ref 3.87–5.11)
RDW: 13.7 % (ref 11.5–15.5)
WBC: 11.5 10*3/uL — ABNORMAL HIGH (ref 4.0–10.5)
nRBC: 0 % (ref 0.0–0.2)

## 2021-05-21 LAB — SODIUM, URINE, RANDOM: Sodium, Ur: <10 mmol/L

## 2021-05-21 LAB — MAGNESIUM
Magnesium: 1.9 mg/dL (ref 1.7–2.4)
Magnesium: 1.7 mg/dL (ref 1.7–2.4)

## 2021-05-21 LAB — CREATININE, URINE, RANDOM: Creatinine, Urine: 142.02 mg/dL

## 2021-05-21 MED ORDER — SODIUM CHLORIDE 0.9 % IV BOLUS
1500.0000 mL | Freq: Once | INTRAVENOUS | Status: AC
  Administered 2021-05-21: 1500 mL via INTRAVENOUS

## 2021-05-21 MED ORDER — CHLORHEXIDINE GLUCONATE CLOTH 2 % EX PADS
6.0000 | MEDICATED_PAD | Freq: Every day | CUTANEOUS | Status: DC
  Administered 2021-05-21 – 2021-05-30 (×8): 6 via TOPICAL

## 2021-05-21 MED ORDER — SODIUM CHLORIDE 0.9 % IV BOLUS: 500.0000 mL | Freq: Once | INTRAVENOUS | Status: DC

## 2021-05-21 MED ORDER — SODIUM CHLORIDE 0.9 % IV BOLUS
1000.0000 mL | Freq: Once | INTRAVENOUS | Status: AC
  Administered 2021-05-21: 1000 mL via INTRAVENOUS

## 2021-05-21 MED ORDER — DEXTROSE IN LACTATED RINGERS 5 % IV SOLN: INTRAVENOUS | Status: DC

## 2021-05-21 MED ORDER — SODIUM CHLORIDE 0.9 % IV BOLUS
500.0000 mL | Freq: Once | INTRAVENOUS | Status: AC
  Administered 2021-05-21: 500 mL via INTRAVENOUS

## 2021-05-21 MED ORDER — PIPERACILLIN-TAZOBACTAM 3.375 G IVPB
3.3750 g | Freq: Three times a day (TID) | INTRAVENOUS | Status: AC
  Administered 2021-05-21 – 2021-05-26 (×15): 3.375 g via INTRAVENOUS
  Filled 2021-05-21 (×16): qty 50

## 2021-05-21 NOTE — Progress Notes (Signed)
°   05/21/21 1400  Mobility  Activity Ambulated with assistance in hallway  Level of Assistance Standby assist, set-up cues, supervision of patient - no hands on  Assistive Device Other (Comment) (IV pole)  Distance Ambulated (ft) 80 ft  Activity Response Tolerated well  $Mobility charge 1 Mobility   Pt agreeable to mobilize this afternoon. Ambulated about 61ft in hall with RW, toelrated well. Noted 4/10 pain at ostomy site, otherwise no complaints. Left pt in bed, call bell at side. RN/NT notified of session.   [place] Special[facility] Acute Rehab Services Office: [phone]

## 2021-05-21 NOTE — Progress Notes (Addendum)
1 Day Post-Op  Subjective: CC: In good spirits. Having some pain around her incision and on the left side of her abdomen.  NG tube in place with minimal output.  No nausea. Had some leaking around her ostomy in pacu and they had to replace the ostomy pouch per patient. Only small amount of sweat in ostomy bag since that time. Has not been oob. Foley in place.   Objective: Vital signs in last 24 hours: Temp:  [97.6 F (36.4 C)-98.9 F (37.2 C)] 98.4 F (36.9 C) (02/21 0932) Pulse Rate:  [80-103] 86 (02/21 0932) Resp:  [16-35] 16 (02/21 0932) BP: (88-135)/(51-79) 88/64 (02/21 0932) SpO2:  [77 %-95 %] 86 % (02/21 0932) Weight:  [96.3 kg] 96.3 kg (02/20 1214) Last BM Date : 05/20/21  Intake/Output from previous day: 02/20 0701 - 02/21 0700 In: 5046.8 [I.V.:3078.8; IV Piggyback:1968] Out: 1130 [Urine:455; Stool:375; Blood:200] Intake/Output this shift: No intake/output data recorded.  PE: Gen:  Alert, NAD, pleasant Pulm: Rate and effort normal Abd: Obese but soft. Appropriately tender around her midline wound. Some L sided abdominal tenderness but no peritonitis. Hypoactive BS. Some sweat in ostomy bag, no air. Stoma retracted but viable w/ red rubber in place. Midline wound clean as seen in picture below. Psych: A&Ox3         Lab Results:  Recent Labs    05/20/21 0410 05/21/21 0437  WBC 12.1* 11.5*  HGB 12.5 15.9*  HCT 36.7 48.8*  PLT 298 345   BMET Recent Labs    05/20/21 0410 05/21/21 0437  NA 133* 134*  K 2.7* 5.7*  CL 104 107  CO2 22 18*  GLUCOSE 141* 189*  BUN <5* 10  CREATININE 0.65 1.89*  CALCIUM 8.5* 8.5*   PT/INR No results for input(s): LABPROT, INR in the last 72 hours. CMP     Component Value Date/Time   NA 134 (L) 05/21/2021 0437   NA 141 10/10/2020 0949   K 5.7 (H) 05/21/2021 0437   CL 107 05/21/2021 0437   CO2 18 (L) 05/21/2021 0437   GLUCOSE 189 (H) 05/21/2021 0437   BUN 10 05/21/2021 0437   BUN 13 10/10/2020 0949    CREATININE 1.89 (H) 05/21/2021 0437   CALCIUM 8.5 (L) 05/21/2021 0437   PROT 4.6 (L) 05/21/2021 0437   PROT 6.8 10/10/2020 0949   ALBUMIN 2.1 (L) 05/21/2021 0437   ALBUMIN 4.4 10/10/2020 0949   AST 28 05/21/2021 0437   ALT 16 05/21/2021 0437   ALKPHOS 34 (L) 05/21/2021 0437   BILITOT 0.8 05/21/2021 0437   BILITOT 0.6 10/10/2020 0949   GFRNONAA 28 (L) 05/21/2021 0437   GFRAA 86 01/30/2020 1050   Lipase     Component Value Date/Time   LIPASE 31 05/17/2021 1632    Studies/Results: No results found.  Anti-infectives: Anti-infectives (From admission, onward)    Start     Dose/Rate Route Frequency Ordered Stop   05/20/21 0845  cefoTEtan (CEFOTAN) 2 g in sodium chloride 0.9 % 100 mL IVPB       Note to Pharmacy: Pharmacy to adjust dose as needed   2 g 200 mL/hr over 30 Minutes Intravenous On call to O.R. 05/20/21 0753 05/20/21 1435   05/18/21 1000  ciprofloxacin (CIPRO) IVPB 400 mg        400 mg 200 mL/hr over 60 Minutes Intravenous Every 12 hours 05/18/21 0556     05/18/21 1000  metroNIDAZOLE (FLAGYL) IVPB 500 mg  500 mg 100 mL/hr over 60 Minutes Intravenous Every 12 hours 05/18/21 0556     05/17/21 2200  ciprofloxacin (CIPRO) IVPB 400 mg        400 mg 200 mL/hr over 60 Minutes Intravenous  Once 05/17/21 2149 05/17/21 2320   05/17/21 2200  metroNIDAZOLE (FLAGYL) IVPB 500 mg        500 mg 100 mL/hr over 60 Minutes Intravenous  Once 05/17/21 2149 05/17/21 2315        Assessment/Plan POD 1 s/p Exploratory laparotomy, right hemicolectomy, loop sigmoid colostomy for Very distal colonic stricture extremely deep in the pelvis and perforated cecum - Dr. Kae Heller, 05/20/2021 - Cont IV abx x 5 days post op - NGT in place. Will leave on LIWS today. High risk for ileus.  - Monitor stoma. Retracted but viable. Cont red rubber - Start Wound vac. TOC consult for HHRN & VAC - WOCN following for new ostomy and vac - Mobilize, PT eval - Pulm toilet  FEN - NPO, NGT, IVF per TRH -  1L bolus followed by 125cc/hr. K 5.7 VTE - SCDs, Lovenox. Cont to hold plavix for now ID - Cipro/Flagyl 2/17 >> WBC 11.5, afebrile Foley - In place. Monitor I/O  - Per TRH - Hyperkalemia - K 5.7. TRH d/c'd potassium in IVF . TRH has written for BMP at 2pm AKI - Cr 1.89. Looks dry/hemoconcentrated on labs. IVF bolus and IVF rate increased. Avoid NSAIDs. Recheck in AM. Keep foley to monitor I/O for additonal day E. Coli UTI Hx CAD s/p DES 2021 - hold home plavix HTN HLD OSA Hx CHF  This care is post op   LOS: 4 days    Jillyn Ledger , [facility] Surgery 05/21/2021, 9:41 AM Please see Amion for pager number during day hours 7:00am-4:30pm

## 2021-05-21 NOTE — Care Management Important Message (Signed)
Important Message  Patient Details IM Letter given to the Patient. Name: Valerie Fields MRN: [id number] Date of Birth: 02-08-1951   Medicare Important Message Given:  Yes     Kerin Salen 05/21/2021, 3:15 PM

## 2021-05-21 NOTE — Consult Note (Signed)
[place] Nurse Consult Note: Reason for Consult:Place Midline NPWT on Wednesday.  Change on a M/W/F schedule. Wound type: Surgical Pressure Injury POA: Yes  Discussed with [facility] PA-C M. Maczis regarding placement of NPWT and subsequent changes.  NPWT to be placed tomorrow (Wednesday).  A NPWT pump, cannister and two large dressing kits are ordered and I have asked that they be placed in the patient's room.  [place] nursing team will follow, for wound and ostomy care and teaching. [place] Nurse will remain available to this patient, the nursing and medical teams.  Thanks, Maudie Flakes, MSN, RN, [place], Arther Abbott  Pager# [phone]

## 2021-05-21 NOTE — Progress Notes (Signed)
Pharmacy Antibiotic Note  Valerie Fields is a 71 y.o. female admitted on 05/17/2021 with colonic obstruction.  Today, Pharmacy has been consulted for Zosyn dosing for IAI.  Surgeries this admission: 2/19 flexible sigmoidoscopy 2/20 exp lap with right hemi colectomy; creation of loop sigmoid colostomy for very distal colonic stricture extremely deep in the pelvis and perforated cecum  Plan: Zosyn 3.375g IV q8h (4 hour infusion). Monitor clinical progress, renal function F/U C&S, abx deescalation / LOT (Maczis noted to continue IV abx x 5 days postop)   Height: 5\' 2"  (157.5 cm) Weight: 96.3 kg (212 lb 4.9 oz) IBW/kg (Calculated) : 50.1  Temp (24hrs), Avg:98.2 F (36.8 C), Min:97.6 F (36.4 C), Max:98.5 F (36.9 C)  Recent Labs  Lab 05/17/21 1632 05/18/21 0740 05/18/21 1352 05/19/21 0436 05/20/21 0410 05/21/21 0437  WBC 13.8* 11.2*  --   --  12.1* 11.5*  CREATININE 0.84 0.75 0.61 0.70 0.65 1.89*    Estimated Creatinine Clearance: 30.4 mL/min (A) (by C-G formula based on SCr of 1.89 mg/dL (H)).    Allergies  Allergen Reactions   Soy Allergy Hives   Sulfa Antibiotics Other (See Comments)    As a child, turned red above neck    Antimicrobials this admission: 2/17 Flagyl & Cipro >> 2/21 2/21 ZEI >>   Microbiology results: 2/17 UCx: >=100,000 colonies/ml E. Coli, pan sensitive   Thank you for allowing pharmacy to be a part of this patients care.  Royetta Asal, PharmD, BCPS Clinical Pharmacist [place] Please utilize Amion for appropriate phone number to reach the unit pharmacist ([place]) 05/21/2021 3:32 PM

## 2021-05-21 NOTE — Progress Notes (Signed)
Labs reviewed  Creat better, K better, CO2 better  UoP ~125  Bolus 1.5 liter crystalloid, then back to D5 Maint--am labs  Expect ATN to recover in 1-2 d  Pounch 1/3 filled with liquid like stool  Verneita Griffes, MD [facility] 5:18 PM '

## 2021-05-21 NOTE — Progress Notes (Signed)
Date and time results received: 05/21/21 6:27 AM  (use smartphrase ".now" to insert current time)  Test: potassium level 5.7, creatinine 1.89 Critical Value: potassium 5.7, creatinine1.89  Name of Provider Notified: Lavon Paganini NP  Orders Received? Or Actions Taken?: waiting for new orders

## 2021-05-21 NOTE — TOC Progression Note (Addendum)
Transition of Care [facility]) - Progression Note   Patient Details  Name: Valerie Fields MRN: [id number] Date of Birth: July 09, 1950  Transition of [facility]) CM/SW [place], LCSW Phone Number: 05/21/2021, 12:11 PM  Clinical Narrative: [facility] consulted for [facility] as patient is a new ostomy. CSW made [facility] referral to Levada Dy with [facility] as the agency is in-network with [employer]. CSW updated patient regarding HHRN. TOC to follow.  Expected Discharge Plan: [place] Barriers to Discharge: Continued Medical Work up  Expected Discharge Plan and Services Expected Discharge Plan: [place] In-house Referral: Clinical Social Work Discharge Planning Services: CM Consult Post Acute Care Choice: [place] arrangements for the past 2 months: Single Family Home           DME Arranged: N/A DME Agency: NA HH Arranged: RN [place] Agency: [place] Date [place]: 05/21/21 Representative spoke with at [place]: Levada Dy  Readmission Risk Interventions No flowsheet data found.

## 2021-05-21 NOTE — Progress Notes (Signed)
Progress Note   Patient: Valerie Fields PRF:[id number] DOB: 1950-04-04 DOA: 05/17/2021     4 DOS: the patient was seen and examined on 05/21/2021   Brief hospital course: 71 year old white female community dwelling Known reflux BMI 37.4 Obstructive sleep apnea NSTEMI 10/18/2019-cath = single-vessel CAD high-grade LAD stenosis Rx DES Combined heart failure systolic diastolic EF 93% at that time with improvement to 55-60% on office visit 04/24/2021 HLD Colonoscopy 2017 diverticulosis--biliary dyskinesia supposed to see general surgery  Patient saw [facility] gastroenterology 2/16 as per her note-generalized abdominal pain modulated by dicyclomine On exam at that time she had a tense abdomen with tympanitic sounds Told to trial MiraLAX, Cipro Flagyl   Data Sodium 132 K 2.9 BUNs/creatinine 8/0.8 hemoglobin 15 C. difficile neg  Seen by GI/Gen surg--Had flex sig--underwent operative management 05/20/2021 with right hemicolectomy and loop sigmoid colostomy-this had to be an open procedure secondary to very distal colonic stricture deep in the pelvis with a perforated cecum-patient had feculent peritonitis at the time of operation  Assessment and Plan:  Recurrent diverticulitis with probable diverticular stricture?  Sigmoid col Discontinued Fentanyl changed to morphine 1 mg every 2 as needed severe pain Appreciate the care coordination between GI/GEN surg Continuing cirpo flagyll iv Flex sig as below shows diverticular stricture Colectomy today per general surgery-appreciate general surgery input Plavix 75 on hold for procedure resume when decided upon by specialists who are doing procedures Profound hypokalemia Now actually hyperkalemic see below ATN secondary to perioperative fluid losses, hypotension  Patient hypotensive with MAP in the low 60s to 65 range She is however very coherent-she responded to 1.5 L bolus and is mentating well and walked about 800 feet Her urine  output is very low so we are watching this closely and awaiting labs Her urine sodium is less than 10 , and FeNa is 0.1 indicating this is prerenal ATN Continue fluids with D5/LR at 200 cc/H but hold if she gets swollen Low threshold to stand to stepdown-we will discuss with nephrology if next labs are abnormal and will alkalinize her fluids in addition Leukocytosis normalization 2/2 underlying diverticulitis Continue prior to admission IV antibiotics but changed to IV-continue Cipro l Flagyl CAD + LAD stenosis status post DES, HF R EF-HF improved EF HTN Losartan 25 to continue metoprolol XL 25 continue Lipitor      Subjective:   Postoperative looks well however note multiple deranged electrolytes Pain is manageable/tolerable Urine output only 90 when I saw her  Physical Exam: Vitals:   05/21/21 0932 05/21/21 1112 05/21/21 1308 05/21/21 1430  BP: (!) 88/64 (!) 88/56 (!) 91/51 (!) 101/52  Pulse: 86 96  99  Resp: 16   16  Temp: 98.4 F (36.9 C)   97.8 F (36.6 C)  TempSrc:    Oral  SpO2: (!) 86% 93% 92% 94%  Weight:      Height:       EOMI NCAT thick neck Mallampati 2 CTA B no added sound no rales rhonchi Abdomen  with postop changes as per surgery Moving all 4 limbs equally  Data Reviewed:  Sodium 134 potassium 5.7 bicarb 18 BUN/creatinine 10/1.8 WBC 11.5 Impression:1. High-grade benign-appearing distal sigmoid colon                            stricture.  Family Communication: None present at the bedside  Disposition: Status is: Inpatient Remains inpatient appropriate because: Requires  surgery    Planned Discharge  Destination:  To be determined at this time    Time spent: 47 minutes  Author: Nita Sells, MD 05/21/2021 4:44 PM  For on call review [email or web address].

## 2021-05-22 DIAGNOSIS — E876 Hypokalemia: Secondary | ICD-10-CM | POA: Diagnosis not present

## 2021-05-22 DIAGNOSIS — K631 Perforation of intestine (nontraumatic): Secondary | ICD-10-CM | POA: Diagnosis not present

## 2021-05-22 DIAGNOSIS — K56699 Other intestinal obstruction unspecified as to partial versus complete obstruction: Secondary | ICD-10-CM

## 2021-05-22 DIAGNOSIS — K56609 Unspecified intestinal obstruction, unspecified as to partial versus complete obstruction: Secondary | ICD-10-CM | POA: Diagnosis not present

## 2021-05-22 DIAGNOSIS — I5042 Chronic combined systolic (congestive) and diastolic (congestive) heart failure: Secondary | ICD-10-CM | POA: Diagnosis not present

## 2021-05-22 DIAGNOSIS — Z9889 Other specified postprocedural states: Secondary | ICD-10-CM

## 2021-05-22 LAB — COMPREHENSIVE METABOLIC PANEL
ALT: 16 U/L (ref 0–44)
AST: 28 U/L (ref 15–41)
Albumin: 1.9 g/dL — ABNORMAL LOW (ref 3.5–5.0)
Alkaline Phosphatase: 34 U/L — ABNORMAL LOW (ref 38–126)
Anion gap: 7 (ref 5–15)
BUN: 19 mg/dL (ref 8–23)
CO2: 19 mmol/L — ABNORMAL LOW (ref 22–32)
Calcium: 8.1 mg/dL — ABNORMAL LOW (ref 8.9–10.3)
Chloride: 109 mmol/L (ref 98–111)
Creatinine, Ser: 1.75 mg/dL — ABNORMAL HIGH (ref 0.44–1.00)
GFR, Estimated: 31 mL/min — ABNORMAL LOW (ref >60)
Glucose, Bld: 150 mg/dL — ABNORMAL HIGH (ref 70–99)
Potassium: 4.4 mmol/L (ref 3.5–5.1)
Sodium: 135 mmol/L (ref 135–145)
Total Bilirubin: 0.6 mg/dL (ref 0.3–1.2)
Total Protein: 4.3 g/dL — ABNORMAL LOW (ref 6.5–8.1)

## 2021-05-22 LAB — CBC
HCT: 39.8 % (ref 36.0–46.0)
Hemoglobin: 12.9 g/dL (ref 12.0–15.0)
MCH: 32.2 pg (ref 26.0–34.0)
MCHC: 32.4 g/dL (ref 30.0–36.0)
MCV: 99.3 fL (ref 80.0–100.0)
Platelets: 312 10*3/uL (ref 150–400)
RBC: 4.01 MIL/uL (ref 3.87–5.11)
RDW: 13.9 % (ref 11.5–15.5)
WBC: 14.9 10*3/uL — ABNORMAL HIGH (ref 4.0–10.5)
nRBC: 0 % (ref 0.0–0.2)

## 2021-05-22 LAB — SURGICAL PATHOLOGY

## 2021-05-22 LAB — MAGNESIUM: Magnesium: 1.8 mg/dL (ref 1.7–2.4)

## 2021-05-22 MED ORDER — METOPROLOL TARTRATE 5 MG/5ML IV SOLN
2.5000 mg | Freq: Four times a day (QID) | INTRAVENOUS | Status: DC
  Administered 2021-05-22 – 2021-05-30 (×31): 2.5 mg via INTRAVENOUS
  Filled 2021-05-22 (×31): qty 5

## 2021-05-22 MED ORDER — MAGNESIUM SULFATE 2 GM/50ML IV SOLN
2.0000 g | Freq: Once | INTRAVENOUS | Status: AC
  Administered 2021-05-22: 2 g via INTRAVENOUS
  Filled 2021-05-22: qty 50

## 2021-05-22 MED ORDER — ENOXAPARIN SODIUM 30 MG/0.3ML IJ SOSY
30.0000 mg | PREFILLED_SYRINGE | INTRAMUSCULAR | Status: DC
Start: 2021-05-22 — End: 2021-05-23
  Administered 2021-05-22 – 2021-05-23 (×2): 30 mg via SUBCUTANEOUS
  Filled 2021-05-22 (×2): qty 0.3

## 2021-05-22 MED ORDER — METOPROLOL TARTRATE 12.5 MG HALF TABLET: 12.5000 mg | ORAL_TABLET | Freq: Two times a day (BID) | ORAL | Status: DC

## 2021-05-22 MED ORDER — DEXTROSE IN LACTATED RINGERS 5 % IV SOLN: INTRAVENOUS | Status: AC

## 2021-05-22 NOTE — Evaluation (Signed)
Physical Therapy Evaluation Patient Details Name: Valerie Fields MRN: [id number] DOB: 23-May-1950 Today's Date: 05/22/2021  History of Present Illness  71 yo female admitted with colonic obstruction. S/P hemicolectomy, colostomy, wound vac placement 2/22. Hx of NSTEMI, CAD, HF, OSA  Clinical Impression  On eval, pt required Min A for mobility. She walked ~75 feet with a RW. Moderate pain with activity. O2 93% on RA at rest, 91% on RA with ambulation. Will plan to follow and progress activity as tolerated. At this time, PT recommendation is for HHPT however, pt does live alone. She may need some assistance in home if she progresses well. Will update recommendations as necessary.        Recommendations for follow up therapy are one component of a multi-disciplinary discharge planning process, led by the attending physician.  Recommendations may be updated based on patient status, additional functional criteria and insurance authorization.  Follow Up Recommendations Home health PT (depending on progress)    Assistance Recommended at Discharge PRN  Patient can return home with the following  A little help with walking and/or transfers;A little help with bathing/dressing/bathroom    Equipment Recommendations Rolling walker (2 wheels) (possibly-will continuet to assess)  Recommendations for Other Services  OT consult    Functional Status Assessment Patient has had a recent decline in their functional status and demonstrates the ability to make significant improvements in function in a reasonable and predictable amount of time.     Precautions / Restrictions Precautions Precautions: Fall Precaution Comments: L colostomy, NG tube, Wound Vac Restrictions Weight Bearing Restrictions: No      Mobility  Bed Mobility Overal bed mobility: Needs Assistance Bed Mobility: Sidelying to Sit, Sit to Sidelying   Sidelying to sit: HOB elevated, Min assist     Sit to sidelying: Mod  assist General bed mobility comments: Assist for trunk and LEs. Pt relied on bedrail. Increased time. Cues provided.    Transfers Overall transfer level: Needs assistance Equipment used: Rolling walker (2 wheels) Transfers: Sit to/from Stand Sit to Stand: From elevated surface, Min assist           General transfer comment: Assist to power up, steady, control descent. Cues for safety, technique, hand placement.    Ambulation/Gait Ambulation/Gait assistance: Min assist Gait Distance (Feet): 75 Feet Assistive device: Rolling walker (2 wheels) Gait Pattern/deviations: Step-through pattern, Decreased stride length       General Gait Details: Assist to steady intermittently. Slow, guarded gait with shortened step lengths bilaterally. O2 91% on RA  Stairs            Wheelchair Mobility    Modified Rankin (Stroke Patients Only)       Balance Overall balance assessment: Needs assistance         Standing balance support: Bilateral upper extremity supported, Reliant on assistive device for balance, During functional activity Standing balance-Leahy Scale: Poor                               Pertinent Vitals/Pain Pain Assessment Pain Assessment: 0-10 Pain Score: 7  Pain Location: abdomen with activity Pain Descriptors / Indicators: Discomfort, Sore, Guarding Pain Intervention(s): Limited activity within patient's tolerance, Monitored during session, Repositioned    Home Living Family/patient expects to be discharged to:: Private residence Living Arrangements: Alone Available Help at Discharge: Family Type of Home: House Home Access: Stairs to enter   [employer] of Steps: 1   Home  Layout: One level Home Equipment: None      Prior Function Prior Level of Function : Independent/Modified Independent             Mobility Comments: active-takes cardio classes       Hand Dominance        Extremity/Trunk Assessment   Upper  Extremity Assessment Upper Extremity Assessment: Defer to OT evaluation    Lower Extremity Assessment Lower Extremity Assessment: Generalized weakness    Cervical / Trunk Assessment Cervical / Trunk Assessment: Normal  Communication   Communication: No difficulties  Cognition Arousal/Alertness: Awake/alert Behavior During Therapy: WFL for tasks assessed/performed Overall Cognitive Status: Within Functional Limits for tasks assessed                                          General Comments      Exercises     Assessment/Plan    PT Assessment Patient needs continued PT services  PT Problem List Decreased strength;Decreased mobility;Decreased activity tolerance;Decreased balance;Decreased knowledge of use of DME;Pain       PT Treatment Interventions DME instruction;Therapeutic exercise;Gait training;Balance training;Functional mobility training;Therapeutic activities;Patient/family education    PT Goals (Current goals can be found in the Care Plan section)  Acute Rehab PT Goals Patient Stated Goal: regain PLOF/independence PT Goal Formulation: With patient Time For Goal Achievement: 06/05/21 Potential to Achieve Goals: Good    Frequency Min 3X/week     Co-evaluation               AM-PAC PT "6 Clicks" Mobility  Outcome Measure Help needed turning from your back to your side while in a flat bed without using bedrails?: A Little Help needed moving from lying on your back to sitting on the side of a flat bed without using bedrails?: A Little Help needed moving to and from a bed to a chair (including a wheelchair)?: A Little Help needed standing up from a chair using your arms (e.g., wheelchair or bedside chair)?: A Little Help needed to walk in hospital room?: A Little Help needed climbing 3-5 steps with a railing? : A Little 6 Click Score: 18    End of Session   Activity Tolerance: Patient limited by pain;Patient limited by fatigue Patient  left: in bed;with call bell/phone within reach;with nursing/sitter in room   PT Visit Diagnosis: Other abnormalities of gait and mobility (R26.89);Difficulty in walking, not elsewhere classified (R26.2)    Time: 1610-9604 PT Time Calculation (min) (ACUTE ONLY): 25 min   Charges:   PT Evaluation $PT Eval Moderate Complexity: 1 Mod PT Treatments $Gait Training: 8-22 mins          Doreatha Massed, PT Acute Rehabilitation  Office: [phone] Pager: [phone]

## 2021-05-22 NOTE — Progress Notes (Signed)
2 Days Post-Op  Subjective: CC: A little bit more pain last night, well managed with as needed medications.  No nausea, still feels bloated but less so.  Ostomy is working well.  Objective: Vital signs in last 24 hours: Temp:  [97.8 F (36.6 C)-98.6 F (37 C)] 98.6 F (37 C) (02/22 0453) Pulse Rate:  [96-108] 107 (02/22 0453) Resp:  [16-18] 18 (02/22 0453) BP: (88-126)/(51-62) 126/60 (02/22 0453) SpO2:  [92 %-94 %] 94 % (02/22 0453) Weight:  [103.6 kg] 103.6 kg (02/22 0500) Last BM Date : 05/22/21  Intake/Output from previous day: 02/21 0701 - 02/22 0700 In: 2702.2 [I.V.:893.6; IV Piggyback:1808.7] Out: 1310 [Urine:700; Emesis/NG output:210; Stool:400] Intake/Output this shift: Total I/O In: -  Out: 300 [Urine:200; Emesis/NG output:100]  PE: Gen:  Alert, NAD, pleasant Pulm: Rate and effort normal Abd: Obese but soft, distention improving. Appropriately tender.  Liquid stool and gas in ostomy bag. Stoma retracted but viable w/ red rubber in place. Midline wound clean. Psych: A&Ox3         Lab Results:  Recent Labs    05/21/21 0437 05/22/21 0430  WBC 11.5* 14.9*  HGB 15.9* 12.9  HCT 48.8* 39.8  PLT 345 312    BMET Recent Labs    05/21/21 1622 05/22/21 0430  NA 134* 135  K 4.5 4.4  CL 107 109  CO2 20* 19*  GLUCOSE 142* 150*  BUN 16 19  CREATININE 2.04* 1.75*  CALCIUM 8.3* 8.1*    PT/INR No results for input(s): LABPROT, INR in the last 72 hours. CMP     Component Value Date/Time   NA 135 05/22/2021 0430   NA 141 10/10/2020 0949   K 4.4 05/22/2021 0430   CL 109 05/22/2021 0430   CO2 19 (L) 05/22/2021 0430   GLUCOSE 150 (H) 05/22/2021 0430   BUN 19 05/22/2021 0430   BUN 13 10/10/2020 0949   CREATININE 1.75 (H) 05/22/2021 0430   CALCIUM 8.1 (L) 05/22/2021 0430   PROT 4.3 (L) 05/22/2021 0430   PROT 6.8 10/10/2020 0949   ALBUMIN 1.9 (L) 05/22/2021 0430   ALBUMIN 4.4 10/10/2020 0949   AST 28 05/22/2021 0430   ALT 16 05/22/2021 0430    ALKPHOS 34 (L) 05/22/2021 0430   BILITOT 0.6 05/22/2021 0430   BILITOT 0.6 10/10/2020 0949   GFRNONAA 31 (L) 05/22/2021 0430   GFRAA 86 01/30/2020 1050   Lipase     Component Value Date/Time   LIPASE 31 05/17/2021 1632    Studies/Results: No results found.  Anti-infectives: Anti-infectives (From admission, onward)    Start     Dose/Rate Route Frequency Ordered Stop   05/21/21 1615  piperacillin-tazobactam (ZOSYN) IVPB 3.375 g        3.375 g 12.5 mL/hr over 240 Minutes Intravenous Every 8 hours 05/21/21 1526     05/20/21 0845  cefoTEtan (CEFOTAN) 2 g in sodium chloride 0.9 % 100 mL IVPB       Note to Pharmacy: Pharmacy to adjust dose as needed   2 g 200 mL/hr over 30 Minutes Intravenous On call to O.R. 05/20/21 0753 05/20/21 1436   05/18/21 1000  ciprofloxacin (CIPRO) IVPB 400 mg  Status:  Discontinued        400 mg 200 mL/hr over 60 Minutes Intravenous Every 12 hours 05/18/21 0556 05/21/21 1519   05/18/21 1000  metroNIDAZOLE (FLAGYL) IVPB 500 mg  Status:  Discontinued        500 mg 100 mL/hr over 60  Minutes Intravenous Every 12 hours 05/18/21 0556 05/21/21 1519   05/17/21 2200  ciprofloxacin (CIPRO) IVPB 400 mg        400 mg 200 mL/hr over 60 Minutes Intravenous  Once 05/17/21 2149 05/17/21 2320   05/17/21 2200  metroNIDAZOLE (FLAGYL) IVPB 500 mg        500 mg 100 mL/hr over 60 Minutes Intravenous  Once 05/17/21 2149 05/17/21 2315        Assessment/Plan POD 2 s/p Exploratory laparotomy, right hemicolectomy, loop sigmoid colostomy for Very distal colonic stricture extremely deep in the pelvis and perforated cecum - Dr. Kae Heller, 05/20/2021 - Cont IV abx x 5 days post op - NGT in place. Will try clamping and sips of clears today. High risk for ileus.  - Monitor stoma. Retracted but viable. Cont red rubber - Start Wound vac. TOC consult for [place] following for new ostomy and vac - Mobilize, PT eval - Pulm toilet  FEN - NPO, NGT, IVF per TRH.  Anticipate she  will need Lasix this afternoon or tomorrow morning after significant fluid resuscitation yesterday for prerenal AKI. VTE - SCDs, Lovenox. Cont to hold plavix for now ID - Cipro/Flagyl 2/17 >> WBC up today, afebrile Foley - In place we will continue for now. Monitor I/O  - Per TRH - Hyperkalemia -improved AKI - Cr 1.75, improved  E. Coli UTI Hx CAD s/p DES 2021 - hold home plavix HTN HLD OSA Hx CHF  This care is post op   LOS: 5 days    Clovis Riley , [facility] 05/22/2021, 10:21 AM Please see Amion for floor coverage during day hours 7:00am-4:30pm

## 2021-05-22 NOTE — Plan of Care (Signed)
  Problem: Pain Managment: Goal: General experience of comfort will improve Outcome: Progressing   Problem: Safety: Goal: Ability to remain free from injury will improve Outcome: Progressing   

## 2021-05-22 NOTE — Progress Notes (Signed)
[facility] PROGRESS NOTE    Progress Note  Pasqualina Colasurdo  STM:[id number] DOB: 05-13-1950 DOA: 05/17/2021 PCP: Marda Stalker, PA-C     Brief Narrative:   Talayla Doyel is an 71 y.o. female past medical history coronary artery disease with a history of NSTEMI cath in 2021 that showed a single-vessel disease and high-grade LAD stenosis status post drug-eluting stents, combined systolic and diastolic heart failure with an EF of 60% by 2D echo on 04/24/2021, obstructive sleep apnea presents to the ER from the GI office due to multiple episodes of abdominal pain and bloating there was a concern for acute diverticulitis flare patient started having symptoms about 10 days prior to admission started on Augmentin for UTI saw GI in February 2016 for abdominal pain started on demeclocycline in the ED was seen by GI and general surgery had a flex sig, underwent Perative management in 05/20/2021 with a right colectomy and loop sigmoid colostomy this had to be doing openly due to distal colonic stricture in the pelvis and had a perforated cecum for which she developed feculent peritonitis at the time of surgery   Assessment/Plan:   High-grade benign distal sigmoid stricture with perforation probable diverticular stricture of the sigmoid/Status post exploratory laparotomy with right hemicolectomy, loop sigmoid colostomy and a perforated cecum on 05/20/2021: She was started on IV antibiotics Cipro and Flagyl on 05/17/2021, transition to IV Zosyn on 05/21/2021 as she is at high risk of developing abscess. Continue to hold Plavix, surgery to dictate when to restart. Monitor fever curve and CBC.  Acute kidney injury: Likely prerenal azotemia, in the setting of ACE inhibitor. She was started on aggressive fluid resuscitation. She is positive about 10 L her creatinine has started to improve. She is probably developing third spacing in her abdomen. She is requiring 2 L of oxygen to keep  saturations greater than 94%.  Leukocytosis: Likely due to perforation, currently on IV Zosyn. Blood cultures have been ordered. Continue to monitor fever curve and white blood cell count. Tmax of 98.5.  Chronic combined systolic and diastolic heart failure: Last 2D echo in 2021 showed an EF of 60% with grade 2 diastolic heart failure. She was stabilized she is becoming slightly tachycardic. Started on low-dose beta-blockers, reduce the rate of IV fluids.  History of coronary artery disease with a DES stent to the LAD: Holding Plavix surgery to dictate when to restart.  Essential hypertension: Hold losartan resume IV metoprolol as does she is mildly tachycardic.  Blood pressure is trending up.  Morbid obesity: Counseling  DVT prophylaxis: sce Family Communication:sister Status is: Inpatient Remains inpatient appropriate because: Perforated colon     Code Status:     Code Status Orders  (From admission, onward)           Start     Ordered   05/18/21 0729  Full code  Continuous        05/18/21 0729           Code Status History     Date Active Date Inactive Code Status Order ID Comments User Context   05/17/2021 2255 05/18/2021 0729 Full Code [id number]  Kristopher Oppenheim, DO ED   10/13/2019 1348 10/15/2019 1603 Full Code [id number]  Nelva Bush, MD Inpatient      Advance Directive Documentation    Flowsheet Row Most Recent Value  Type of Advance Directive Healthcare Power of Attorney, Living will  Pre-existing out of facility DNR order (yellow form or pink MOST form) --  "  MOST" Form in Place? --         IV Access:   Peripheral IV   Procedures and diagnostic studies:   No results found.   Medical Consultants:   None.   Subjective:    Karmen Altamirano relates her pain is controlled.  Objective:    Vitals:   05/21/21 1430 05/21/21 2034 05/22/21 0453 05/22/21 0500  BP: (!) 101/52 115/62 126/60   Pulse: 99 (!) 108 (!) 107   Resp:  16 18 18    Temp: 97.8 F (36.6 C) 98.5 F (36.9 C) 98.6 F (37 C)   TempSrc: Oral  Oral   SpO2: 94% 93% 94%   Weight:    103.6 kg  Height:       SpO2: 94 % O2 Flow Rate (L/min): 2 L/min   Intake/Output Summary (Last 24 hours) at 05/22/2021 0948 Last data filed at 05/22/2021 0648 Gross per 24 hour  Intake 2702.22 ml  Output 1310 ml  Net 1392.22 ml   Filed Weights   05/19/21 0458 05/20/21 1214 05/22/21 0500  Weight: 96.3 kg 96.3 kg 103.6 kg    Exam: General exam: In no acute distress. Respiratory system: Good air movement and clear to auscultation. Cardiovascular system: S1 & S2 heard, RRR. No JVD.  Gastrointestinal system: Abdomen is nondistended, soft and nontender.  Extremities: No pedal edema. Skin: No rashes, lesions or ulcers Psychiatry: Judgement and insight appear normal. Mood & affect appropriate.    Data Reviewed:    Labs: Basic Metabolic Panel: Recent Labs  Lab 05/19/21 0436 05/20/21 0410 05/21/21 0437 05/21/21 1622 05/22/21 0430  NA 135 133* 134* 134* 135  K 3.1* 2.7* 5.7* 4.5 4.4  CL 107 104 107 107 109  CO2 22 22 18* 20* 19*  GLUCOSE 113* 141* 189* 142* 150*  BUN <5* <5* 10 16 19   CREATININE 0.70 0.65 1.89* 2.04* 1.75*  CALCIUM 8.7* 8.5* 8.5* 8.3* 8.1*  MG 2.0 1.8 1.9 1.7 1.8   GFR Estimated Creatinine Clearance: 34.2 mL/min (A) (by C-G formula based on SCr of 1.75 mg/dL (H)). Liver Function Tests: Recent Labs  Lab 05/19/21 0436 05/20/21 0410 05/21/21 0437 05/21/21 1622 05/22/21 0430  AST 17 16 28 30 28   ALT 17 16 16 15 16   ALKPHOS 49 48 34* 32* 34*  BILITOT 0.6 0.7 0.8 0.3 0.6  PROT 6.0* 6.0* 4.6* 4.5* 4.3*  ALBUMIN 3.1* 3.0* 2.1* 2.0* 1.9*   Recent Labs  Lab 05/17/21 1632  LIPASE 31   No results for input(s): AMMONIA in the last 168 hours. Coagulation profile No results for input(s): INR, PROTIME in the last 168 hours. COVID-19 Labs  No results for input(s): DDIMER, FERRITIN, LDH, CRP in the last 72 hours.  Lab Results   Component Value Date   SARSCOV2NAA NEGATIVE 05/17/2021   [place] Not Detected 12/09/2019   [place] NEGATIVE 10/13/2019    CBC: Recent Labs  Lab 05/17/21 1632 05/18/21 0740 05/20/21 0410 05/21/21 0437 05/22/21 0430  WBC 13.8* 11.2* 12.1* 11.5* 14.9*  NEUTROABS 10.6* 8.2* 9.3* 9.5*  --   HGB 15.8* 13.3 12.5 15.9* 12.9  HCT 45.5 39.3 36.7 48.8* 39.8  MCV 92.7 94.2 94.6 99.8 99.3  PLT 395 322 298 345 312   Cardiac Enzymes: No results for input(s): CKTOTAL, CKMB, CKMBINDEX, TROPONINI in the last 168 hours. BNP (last 3 results) No results for input(s): PROBNP in the last 8760 hours. CBG: No results for input(s): GLUCAP in the last 168 hours. D-Dimer: No results  for input(s): DDIMER in the last 72 hours. Hgb A1c: No results for input(s): HGBA1C in the last 72 hours. Lipid Profile: No results for input(s): CHOL, HDL, LDLCALC, TRIG, CHOLHDL, LDLDIRECT in the last 72 hours. Thyroid function studies: No results for input(s): TSH, T4TOTAL, T3FREE, THYROIDAB in the last 72 hours.  Invalid input(s): FREET3 Anemia work up: No results for input(s): VITAMINB12, FOLATE, FERRITIN, TIBC, IRON, RETICCTPCT in the last 72 hours. Sepsis Labs: Recent Labs  Lab 05/18/21 0740 05/20/21 0410 05/21/21 0437 05/22/21 0430  WBC 11.2* 12.1* 11.5* 14.9*   Microbiology Recent Results (from the past 240 hour(s))  Resp Panel by RT-PCR (Flu A&B, Covid) Urine, Clean Catch     Status: None   Collection Time: 05/17/21  4:32 PM   Specimen: Urine, Clean Catch; Nasopharyngeal(NP) swabs in vial transport medium  Result Value Ref Range Status   SARS Coronavirus 2 by RT PCR NEGATIVE NEGATIVE Final    Comment: (NOTE) SARS-CoV-2 target nucleic acids are NOT DETECTED.  The SARS-CoV-2 RNA is generally detectable in upper respiratory specimens during the acute phase of infection. The lowest concentration of SARS-CoV-2 viral copies this assay can detect is 138 copies/mL. A negative result does not  preclude SARS-Cov-2 infection and should not be used as the sole basis for treatment or other patient management decisions. A negative result may occur with  improper specimen collection/handling, submission of specimen other than nasopharyngeal swab, presence of viral mutation(s) within the areas targeted by this assay, and inadequate number of viral copies(<138 copies/mL). A negative result must be combined with clinical observations, patient history, and epidemiological information. The expected result is Negative.  Fact Sheet for Patients:  EntrepreneurPulse.com.au  Fact Sheet for Healthcare Providers:  IncredibleEmployment.be  This test is no t yet approved or cleared by the [place] FDA and  has been authorized for detection and/or diagnosis of SARS-CoV-2 by FDA under an Emergency Use Authorization (EUA). This EUA will remain  in effect (meaning this test can be used) for the duration of the COVID-19 declaration under Section 564(b)(1) of the Act, 21 U.S.C.section 360bbb-3(b)(1), unless the authorization is terminated  or revoked sooner.       Influenza A by PCR NEGATIVE NEGATIVE Final   Influenza B by PCR NEGATIVE NEGATIVE Final    Comment: (NOTE) The Xpert Xpress SARS-CoV-2/FLU/RSV plus assay is intended as an aid in the diagnosis of influenza from Nasopharyngeal swab specimens and should not be used as a sole basis for treatment. Nasal washings and aspirates are unacceptable for Xpert Xpress SARS-CoV-2/FLU/RSV testing.  Fact Sheet for Patients: EntrepreneurPulse.com.au  Fact Sheet for Healthcare Providers: IncredibleEmployment.be  This test is not yet approved or cleared by the [place] FDA and has been authorized for detection and/or diagnosis of SARS-CoV-2 by FDA under an Emergency Use Authorization (EUA). This EUA will remain in effect (meaning this test can be used) for the  duration of the COVID-19 declaration under Section 564(b)(1) of the Act, 21 U.S.C. section 360bbb-3(b)(1), unless the authorization is terminated or revoked.  Performed at [facility], [place] [address] [place], [place] [address]   Urine Culture     Status: Abnormal   Collection Time: 05/17/21 10:27 PM   Specimen: Urine, Clean Catch  Result Value Ref Range Status   Specimen Description   Final    URINE, CLEAN CATCH Performed at [facility], [place] [address], [place], [place] [address]    Special Requests   Final    NONE  Performed at [facility], [place] [address], [place], [place] [address]    Culture >=100,000 COLONIES/mL ESCHERICHIA COLI (A)  Final   Report Status 05/20/2021 FINAL  Final   Organism ID, Bacteria ESCHERICHIA COLI (A)  Final      Susceptibility   Escherichia coli - MIC*    AMPICILLIN 8 SENSITIVE Sensitive     CEFAZOLIN <=4 SENSITIVE Sensitive     CEFEPIME <=0.12 SENSITIVE Sensitive     CEFTRIAXONE <=0.25 SENSITIVE Sensitive     CIPROFLOXACIN <=0.25 SENSITIVE Sensitive     GENTAMICIN <=1 SENSITIVE Sensitive     IMIPENEM <=0.25 SENSITIVE Sensitive     NITROFURANTOIN <=16 SENSITIVE Sensitive     TRIMETH/SULFA <=20 SENSITIVE Sensitive     AMPICILLIN/SULBACTAM 4 SENSITIVE Sensitive     PIP/TAZO <=4 SENSITIVE Sensitive     * >=100,000 COLONIES/mL ESCHERICHIA COLI  C Difficile Quick Screen w PCR reflex     Status: None   Collection Time: 05/18/21 10:00 AM  Result Value Ref Range Status   C Diff antigen NEGATIVE NEGATIVE Final   C Diff toxin NEGATIVE NEGATIVE Final   C Diff interpretation No C. difficile detected.  Final    Comment: Performed at [facility], [place] [address] [place], [place] [address]     Medications:    atorvastatin  80 mg Oral Daily   Chlorhexidine Gluconate Cloth  6 each Topical Daily   enoxaparin (LOVENOX) injection  30 mg Subcutaneous Q24H   montelukast  10 mg Oral  QHS   pantoprazole  40 mg Oral BID   Continuous Infusions:  dextrose 5% lactated ringers 75 mL/hr at 05/22/21 0648   lactated ringers 10 mL/hr at 05/20/21 1226   magnesium sulfate bolus IVPB 2 g (05/22/21 0852)   methocarbamol (ROBAXIN) IV     piperacillin-tazobactam (ZOSYN)  IV 12.5 mL/hr at 05/22/21 0648      LOS: 5 days   Charlynne Cousins  [facility]  05/22/2021, 9:48 AM

## 2021-05-22 NOTE — Plan of Care (Signed)

## 2021-05-23 ENCOUNTER — Inpatient Hospital Stay (HOSPITAL_COMMUNITY): Payer: Medicare HMO

## 2021-05-23 DIAGNOSIS — I5042 Chronic combined systolic (congestive) and diastolic (congestive) heart failure: Secondary | ICD-10-CM | POA: Diagnosis not present

## 2021-05-23 DIAGNOSIS — I2510 Atherosclerotic heart disease of native coronary artery without angina pectoris: Secondary | ICD-10-CM | POA: Diagnosis not present

## 2021-05-23 DIAGNOSIS — E876 Hypokalemia: Secondary | ICD-10-CM | POA: Diagnosis not present

## 2021-05-23 DIAGNOSIS — K56609 Unspecified intestinal obstruction, unspecified as to partial versus complete obstruction: Secondary | ICD-10-CM | POA: Diagnosis not present

## 2021-05-23 LAB — BASIC METABOLIC PANEL
Anion gap: 5 (ref 5–15)
BUN: 20 mg/dL (ref 8–23)
CO2: 20 mmol/L — ABNORMAL LOW (ref 22–32)
Calcium: 8.2 mg/dL — ABNORMAL LOW (ref 8.9–10.3)
Chloride: 108 mmol/L (ref 98–111)
Creatinine, Ser: 1.23 mg/dL — ABNORMAL HIGH (ref 0.44–1.00)
GFR, Estimated: 48 mL/min — ABNORMAL LOW (ref >60)
Glucose, Bld: 114 mg/dL — ABNORMAL HIGH (ref 70–99)
Potassium: 3.6 mmol/L (ref 3.5–5.1)
Sodium: 133 mmol/L — ABNORMAL LOW (ref 135–145)

## 2021-05-23 LAB — MAGNESIUM: Magnesium: 2.4 mg/dL (ref 1.7–2.4)

## 2021-05-23 MED ORDER — POTASSIUM CHLORIDE 20 MEQ PO PACK
40.0000 meq | PACK | Freq: Two times a day (BID) | ORAL | Status: AC
  Administered 2021-05-23 (×2): 40 meq via ORAL
  Filled 2021-05-23 (×2): qty 2

## 2021-05-23 MED ORDER — ENOXAPARIN SODIUM 40 MG/0.4ML IJ SOSY
40.0000 mg | PREFILLED_SYRINGE | INTRAMUSCULAR | Status: DC
  Administered 2021-05-24 – 2021-05-30 (×7): 40 mg via SUBCUTANEOUS
  Filled 2021-05-23 (×7): qty 0.4

## 2021-05-23 MED ORDER — HYDROMORPHONE HCL 1 MG/ML IJ SOLN
0.5000 mg | INTRAMUSCULAR | Status: DC | PRN
  Administered 2021-05-24 – 2021-05-25 (×3): 1 mg via INTRAVENOUS
  Filled 2021-05-23 (×3): qty 1

## 2021-05-23 MED ORDER — ACETAMINOPHEN 325 MG PO TABS
650.0000 mg | ORAL_TABLET | Freq: Four times a day (QID) | ORAL | Status: DC
  Administered 2021-05-23 – 2021-05-30 (×28): 650 mg via ORAL
  Filled 2021-05-23 (×28): qty 2

## 2021-05-23 NOTE — Evaluation (Signed)
Occupational Therapy Evaluation Patient Details Name: Valerie Fields MRN: [id number] DOB: 1951/01/24 Today's Date: 05/23/2021   History of Present Illness 71 yo female admitted with colonic obstruction. S/P hemicolectomy, colostomy, wound vac placement 2/22. Hx of NSTEMI, CAD, HF, OSA   Clinical Impression   Patient lives at home alone and is independent at baseline, likes to go to cardio classes 5x/week. Currently patient impacted by abdominal pain limiting ability to perform lower body self care and functional transfers. Patient total A for LB dressing task, discussed options of using LH AE and patient ask to try it next session. Patient min A to power up to standing from edge of bed and hand held assistance to side step to recliner chair. Anticipate patient will progress well during acute stay, reports her friends plan to arrange assist and stay overnight for first few nights home. Acute OT to follow.      Recommendations for follow up therapy are one component of a multi-disciplinary discharge planning process, led by the attending physician.  Recommendations may be updated based on patient status, additional functional criteria and insurance authorization.   Follow Up Recommendations  Home health OT    Assistance Recommended at Discharge Intermittent Supervision/Assistance  Patient can return home with the following A little help with walking and/or transfers;A lot of help with bathing/dressing/bathroom    Functional Status Assessment  Patient has had a recent decline in their functional status and demonstrates the ability to make significant improvements in function in a reasonable and predictable amount of time.  Equipment Recommendations  None recommended by OT       Precautions / Restrictions Precautions Precautions: Fall Precaution Comments: L colostomy, NG tube, Wound Vac Restrictions Weight Bearing Restrictions: No      Mobility Bed Mobility Overal bed mobility:  Needs Assistance Bed Mobility: Rolling, Sidelying to Sit Rolling: Supervision Sidelying to sit: Min assist, HOB elevated       General bed mobility comments: To upright trunk min A        Balance Overall balance assessment: Needs assistance Sitting-balance support: Feet supported Sitting balance-Leahy Scale: Good     Standing balance support: Single extremity supported Standing balance-Leahy Scale: Poor                             ADL either performed or assessed with clinical judgement   ADL Overall ADL's : Needs assistance/impaired Eating/Feeding: NPO   Grooming: Set up;Sitting   Upper Body Bathing: Set up;Sitting   Lower Body Bathing: Maximal assistance;Sitting/lateral leans;Sit to/from stand   Upper Body Dressing : Set up;Sitting   Lower Body Dressing: Total assistance;Sitting/lateral leans;Sit to/from stand Lower Body Dressing Details (indicate cue type and reason): Patient with abdominal pain limiting ability to perform LB dressing. Unable to perform figure 4 method. Discuss use of LH AE to assist, patient requests to try it next OT session Toilet Transfer: Minimal assistance;Stand-pivot Toilet Transfer Details (indicate cue type and reason): HHA +1 to take steps to recliner chair. Min A to power up to standing Toileting- Clothing Manipulation and Hygiene: Total assistance Toileting - Clothing Manipulation Details (indicate cue type and reason): Patient with colostomy and catheter     Functional mobility during ADLs: Minimal assistance        Pertinent Vitals/Pain Pain Assessment Pain Assessment: 0-10 Pain Score: 6  Pain Location: abdomen with activity Pain Descriptors / Indicators: Discomfort, Sore, Guarding Pain Intervention(s): Monitored during session, Patient requesting pain meds-RN  notified     Hand Dominance Right   Extremity/Trunk Assessment Upper Extremity Assessment Upper Extremity Assessment: Overall WFL for tasks assessed    Lower Extremity Assessment Lower Extremity Assessment: Defer to PT evaluation   Cervical / Trunk Assessment Cervical / Trunk Assessment: Normal   Communication Communication Communication: No difficulties   Cognition Arousal/Alertness: Awake/alert Behavior During Therapy: WFL for tasks assessed/performed Overall Cognitive Status: Within Functional Limits for tasks assessed                                                  Home Living Family/patient expects to be discharged to:: Private residence Living Arrangements: Alone Available Help at Discharge: Family Type of Home: House Home Access: Stairs to enter Technical brewer of Steps: 1   Home Layout: One level     Bathroom Shower/Tub: Occupational psychologist: Handicapped height     Home Equipment: Grab bars - tub/shower;Other (comment) (Adjustable Bed)          Prior Functioning/Environment Prior Level of Function : Independent/Modified Independent             Mobility Comments: active-takes cardio classes          OT Problem List: Decreased activity tolerance;Impaired balance (sitting and/or standing);Decreased safety awareness;Decreased knowledge of use of DME or AE;Decreased knowledge of precautions;Obesity;Pain      OT Treatment/Interventions: Self-care/ADL training    OT Goals(Current goals can be found in the care plan section) Acute Rehab OT Goals Patient Stated Goal: Get back to cardio classes OT Goal Formulation: With patient Time For Goal Achievement: 06/06/21 Potential to Achieve Goals: Good  OT Frequency: Min 2X/week       AM-PAC OT "6 Clicks" Daily Activity     Outcome Measure Help from another person eating meals?: Total (NPO) Help from another person taking care of personal grooming?: A Little Help from another person toileting, which includes using toliet, bedpan, or urinal?: A Lot Help from another person bathing (including washing, rinsing,  drying)?: A Lot Help from another person to put on and taking off regular upper body clothing?: A Little Help from another person to put on and taking off regular lower body clothing?: Total 6 Click Score: 12   End of Session Equipment Utilized During Treatment: Rolling walker (2 wheels) Nurse Communication: Mobility status;Patient requests pain meds  Activity Tolerance: Patient tolerated treatment well Patient left: in chair;with call bell/phone within reach;with nursing/sitter in room  OT Visit Diagnosis: Other abnormalities of gait and mobility (R26.89)                Time: 6967-8938 OT Time Calculation (min): 23 min Charges:  OT General Charges $OT Visit: 1 Visit OT Evaluation $OT Eval Low Complexity: 1 Low OT Treatments $Self Care/Home Management : 8-22 mins  Delbert Phenix OT OT pager: [place] 05/23/2021, 12:16 PM

## 2021-05-23 NOTE — Progress Notes (Signed)
[facility] PROGRESS NOTE    Progress Note  Falan Hensler  WUJ:[id number] DOB: 05/12/1950 DOA: 05/17/2021 PCP: Marda Stalker, PA-C     Brief Narrative:   Cheyene Hamric is an 71 y.o. female past medical history coronary artery disease with a history of NSTEMI cath in 2021 that showed a single-vessel disease and high-grade LAD stenosis status post drug-eluting stents, combined systolic and diastolic heart failure with an EF of 60% by 2D echo on 04/24/2021, obstructive sleep apnea presents to the ER from the GI office due to multiple episodes of abdominal pain and bloating there was a concern for acute diverticulitis flare patient started having symptoms about 10 days prior to admission started on Augmentin for UTI saw GI in February 2016 for abdominal pain started on demeclocycline in the ED was seen by GI and general surgery had a flex sig, underwent Perative management in 05/20/2021 with a right colectomy and loop sigmoid colostomy this had to be doing openly due to distal colonic stricture in the pelvis and had a perforated cecum for which she developed feculent peritonitis at the time of surgery.She was started on IV antibiotics Cipro and Flagyl on 05/17/2021, transition to IV Zosyn on 05/21/2021 as she is at high risk of developing abscess.   Assessment/Plan:   High-grade benign distal sigmoid stricture with perforation probable diverticular stricture of the sigmoid/Status post exploratory laparotomy with right hemicolectomy, loop sigmoid colostomy and a perforated cecum on 05/20/2021: Continue to hold Plavix, surgery to dictate when to restart. Tmax of 98.9, white blood cell count of 15. Appreciate surgery's assistance further management per them.  They recommended to continue IV antibiotics, patient currently has an NG tube due to her abdominal distention and pressure. She is put out about a liter and a half through her NG tube It is now believed that she may have an  ileus Physical therapy evaluated the patient recommended home health PT.  Acute kidney injury: Likely prerenal azotemia, in the setting of ACE inhibitor. She is about 9 L positive today.  She is put out about 1.5 L from her NG tube. She is probably developing third spacing in her abdomen. Still requiring oxygen supplementation her creatinine has returned to baseline to 1.2. Check a chest x-ray he has crackles bilaterally at her bases, but neck veins are down.  Leukocytosis: Likely due to perforation, currently on IV Zosyn. Continue to monitor fever curve and white blood cell count. Tmax of 98.8.  Chronic combined systolic and diastolic heart failure: Last 2D echo in 2021 showed an EF of 60% with grade 2 diastolic heart failure. She was stabilized she is becoming slightly tachycardic. Continue beta-blockers keep fluids KVO.  History of coronary artery disease with a DES stent to the LAD: Holding Plavix surgery to dictate when to restart.  Essential hypertension: Continue to hold losartan blood pressure stable tachycardia improved with IV metoprolol.  Morbid obesity: Counseling  DVT prophylaxis: scd's Family Communication:sister Status is: Inpatient Remains inpatient appropriate because: Perforated colon     Code Status:     Code Status Orders  (From admission, onward)           Start     Ordered   05/18/21 0729  Full code  Continuous        05/18/21 0729           Code Status History     Date Active Date Inactive Code Status Order ID Comments User Context   05/17/2021 2255 05/18/2021 9562  Full Code [id number]  Kristopher Oppenheim, DO ED   10/13/2019 1348 10/15/2019 1603 Full Code [id number]  Nelva Bush, MD Inpatient      Advance Directive Documentation    Flowsheet Row Most Recent Value  Type of Advance Directive Healthcare Power of Attorney, Living will  Pre-existing out of facility DNR order (yellow form or pink MOST form) --  "MOST" Form in Place? --          IV Access:   Peripheral IV   Procedures and diagnostic studies:   No results found.   Medical Consultants:   None.   Subjective:    Clorine Swing relates her pain is controlled not passing gas Objective:    Vitals:   05/22/21 2048 05/23/21 0006 05/23/21 0500 05/23/21 0609  BP: 134/67 123/65  132/72  Pulse: (!) 103 (!) 102  96  Resp: 18 18  18   Temp: 98.9 F (37.2 C) (!) 97.5 F (36.4 C)  (!) 97.5 F (36.4 C)  TempSrc: Oral Oral  Oral  SpO2: 93% 96%  98%  Weight:   105.3 kg   Height:       SpO2: 98 % O2 Flow Rate (L/min): 2 L/min   Intake/Output Summary (Last 24 hours) at 05/23/2021 1020 Last data filed at 05/23/2021 0600 Gross per 24 hour  Intake 120 ml  Output 1100 ml  Net -980 ml    Filed Weights   05/20/21 1214 05/22/21 0500 05/23/21 0500  Weight: 96.3 kg 103.6 kg 105.3 kg    Exam: General exam: In no acute distress. Respiratory system: Good air movement and mild crackles at bases bilaterally that are not clear by cough Cardiovascular system: S1 & S2 heard, RRR. No JVD. Gastrointestinal system: Abdomen is distended, wound VAC in place Extremities: No pedal edema. Skin: No rashes, lesions or ulcers Psychiatry: Judgement and insight appear normal. Mood & affect appropriate.   Data Reviewed:    Labs: Basic Metabolic Panel: Recent Labs  Lab 05/20/21 0410 05/21/21 0437 05/21/21 1622 05/22/21 0430 05/23/21 0429  NA 133* 134* 134* 135 133*  K 2.7* 5.7* 4.5 4.4 3.6  CL 104 107 107 109 108  CO2 22 18* 20* 19* 20*  GLUCOSE 141* 189* 142* 150* 114*  BUN <5* 10 16 19 20   CREATININE 0.65 1.89* 2.04* 1.75* 1.23*  CALCIUM 8.5* 8.5* 8.3* 8.1* 8.2*  MG 1.8 1.9 1.7 1.8 2.4    GFR Estimated Creatinine Clearance: 49.2 mL/min (A) (by C-G formula based on SCr of 1.23 mg/dL (H)). Liver Function Tests: Recent Labs  Lab 05/19/21 0436 05/20/21 0410 05/21/21 0437 05/21/21 1622 05/22/21 0430  AST 17 16 28 30 28   ALT 17 16 16 15 16    ALKPHOS 49 48 34* 32* 34*  BILITOT 0.6 0.7 0.8 0.3 0.6  PROT 6.0* 6.0* 4.6* 4.5* 4.3*  ALBUMIN 3.1* 3.0* 2.1* 2.0* 1.9*    Recent Labs  Lab 05/17/21 1632  LIPASE 31    No results for input(s): AMMONIA in the last 168 hours. Coagulation profile No results for input(s): INR, PROTIME in the last 168 hours. COVID-19 Labs  No results for input(s): DDIMER, FERRITIN, LDH, CRP in the last 72 hours.  Lab Results  Component Value Date   SARSCOV2NAA NEGATIVE 05/17/2021   [place] Not Detected 12/09/2019   [place] NEGATIVE 10/13/2019    CBC: Recent Labs  Lab 05/17/21 1632 05/18/21 0740 05/20/21 0410 05/21/21 0437 05/22/21 0430  WBC 13.8* 11.2* 12.1* 11.5* 14.9*  NEUTROABS 10.6* 8.2* 9.3*  9.5*  --   HGB 15.8* 13.3 12.5 15.9* 12.9  HCT 45.5 39.3 36.7 48.8* 39.8  MCV 92.7 94.2 94.6 99.8 99.3  PLT 395 322 298 345 312    Cardiac Enzymes: No results for input(s): CKTOTAL, CKMB, CKMBINDEX, TROPONINI in the last 168 hours. BNP (last 3 results) No results for input(s): PROBNP in the last 8760 hours. CBG: No results for input(s): GLUCAP in the last 168 hours. D-Dimer: No results for input(s): DDIMER in the last 72 hours. Hgb A1c: No results for input(s): HGBA1C in the last 72 hours. Lipid Profile: No results for input(s): CHOL, HDL, LDLCALC, TRIG, CHOLHDL, LDLDIRECT in the last 72 hours. Thyroid function studies: No results for input(s): TSH, T4TOTAL, T3FREE, THYROIDAB in the last 72 hours.  Invalid input(s): FREET3 Anemia work up: No results for input(s): VITAMINB12, FOLATE, FERRITIN, TIBC, IRON, RETICCTPCT in the last 72 hours. Sepsis Labs: Recent Labs  Lab 05/18/21 0740 05/20/21 0410 05/21/21 0437 05/22/21 0430  WBC 11.2* 12.1* 11.5* 14.9*    Microbiology Recent Results (from the past 240 hour(s))  Resp Panel by RT-PCR (Flu A&B, Covid) Urine, Clean Catch     Status: None   Collection Time: 05/17/21  4:32 PM   Specimen: Urine, Clean Catch;  Nasopharyngeal(NP) swabs in vial transport medium  Result Value Ref Range Status   SARS Coronavirus 2 by RT PCR NEGATIVE NEGATIVE Final    Comment: (NOTE) SARS-CoV-2 target nucleic acids are NOT DETECTED.  The SARS-CoV-2 RNA is generally detectable in upper respiratory specimens during the acute phase of infection. The lowest concentration of SARS-CoV-2 viral copies this assay can detect is 138 copies/mL. A negative result does not preclude SARS-Cov-2 infection and should not be used as the sole basis for treatment or other patient management decisions. A negative result may occur with  improper specimen collection/handling, submission of specimen other than nasopharyngeal swab, presence of viral mutation(s) within the areas targeted by this assay, and inadequate number of viral copies(<138 copies/mL). A negative result must be combined with clinical observations, patient history, and epidemiological information. The expected result is Negative.  Fact Sheet for Patients:  EntrepreneurPulse.com.au  Fact Sheet for Healthcare Providers:  IncredibleEmployment.be  This test is no t yet approved or cleared by the [place] FDA and  has been authorized for detection and/or diagnosis of SARS-CoV-2 by FDA under an Emergency Use Authorization (EUA). This EUA will remain  in effect (meaning this test can be used) for the duration of the COVID-19 declaration under Section 564(b)(1) of the Act, 21 U.S.C.section 360bbb-3(b)(1), unless the authorization is terminated  or revoked sooner.       Influenza A by PCR NEGATIVE NEGATIVE Final   Influenza B by PCR NEGATIVE NEGATIVE Final    Comment: (NOTE) The Xpert Xpress SARS-CoV-2/FLU/RSV plus assay is intended as an aid in the diagnosis of influenza from Nasopharyngeal swab specimens and should not be used as a sole basis for treatment. Nasal washings and aspirates are unacceptable for Xpert Xpress  SARS-CoV-2/FLU/RSV testing.  Fact Sheet for Patients: EntrepreneurPulse.com.au  Fact Sheet for Healthcare Providers: IncredibleEmployment.be  This test is not yet approved or cleared by the [place] FDA and has been authorized for detection and/or diagnosis of SARS-CoV-2 by FDA under an Emergency Use Authorization (EUA). This EUA will remain in effect (meaning this test can be used) for the duration of the COVID-19 declaration under Section 564(b)(1) of the Act, 21 U.S.C. section 360bbb-3(b)(1), unless the authorization is terminated or revoked.  Performed  at [facility], [place] [address], [place], [place] [address]   Urine Culture     Status: Abnormal   Collection Time: 05/17/21 10:27 PM   Specimen: Urine, Clean Catch  Result Value Ref Range Status   Specimen Description   Final    URINE, CLEAN CATCH Performed at [facility], [place] [address] [place], [place] [address]    Special Requests   Final    NONE Performed at [facility], [place] [address] [place], [place] [address]    Culture >=100,000 COLONIES/mL ESCHERICHIA COLI (A)  Final   Report Status 05/20/2021 FINAL  Final   Organism ID, Bacteria ESCHERICHIA COLI (A)  Final      Susceptibility   Escherichia coli - MIC*    AMPICILLIN 8 SENSITIVE Sensitive     CEFAZOLIN <=4 SENSITIVE Sensitive     CEFEPIME <=0.12 SENSITIVE Sensitive     CEFTRIAXONE <=0.25 SENSITIVE Sensitive     CIPROFLOXACIN <=0.25 SENSITIVE Sensitive     GENTAMICIN <=1 SENSITIVE Sensitive     IMIPENEM <=0.25 SENSITIVE Sensitive     NITROFURANTOIN <=16 SENSITIVE Sensitive     TRIMETH/SULFA <=20 SENSITIVE Sensitive     AMPICILLIN/SULBACTAM 4 SENSITIVE Sensitive     PIP/TAZO <=4 SENSITIVE Sensitive     * >=100,000 COLONIES/mL ESCHERICHIA COLI  C Difficile Quick Screen w PCR reflex     Status: None   Collection Time: 05/18/21 10:00 AM  Result Value Ref Range  Status   C Diff antigen NEGATIVE NEGATIVE Final   C Diff toxin NEGATIVE NEGATIVE Final   C Diff interpretation No C. difficile detected.  Final    Comment: Performed at [facility], [place] [address], [place], [place] [address]     Medications:    acetaminophen  650 mg Oral Q6H   atorvastatin  80 mg Oral Daily   Chlorhexidine Gluconate Cloth  6 each Topical Daily   [START ON 05/24/2021] enoxaparin (LOVENOX) injection  40 mg Subcutaneous Q24H   metoprolol tartrate  2.5 mg Intravenous Q6H   montelukast  10 mg Oral QHS   pantoprazole  40 mg Oral BID   potassium chloride  40 mEq Oral BID   Continuous Infusions:  lactated ringers 10 mL/hr at 05/20/21 1226   methocarbamol (ROBAXIN) IV     piperacillin-tazobactam (ZOSYN)  IV 3.375 g (05/23/21 0623)      LOS: 6 days   Charlynne Cousins  [facility]  05/23/2021, 10:20 AM

## 2021-05-23 NOTE — TOC Progression Note (Signed)
Transition of Care [facility]) - Progression Note   Patient Details  Name: Valerie Fields MRN: [id number] Date of Birth: May 16, 1950  Transition of Care [facility]) CM/SW [place], LCSW Phone Number: 05/23/2021, 10:38 AM  Clinical Narrative: CSW updated Levada Dy with [facility] that the patient is expected to be here through the weekend. TOC to follow.  Expected Discharge Plan: [place] Barriers to Discharge: Continued Medical Work up  Expected Discharge Plan and Services Expected Discharge Plan: [place] In-house Referral: Clinical Social Work Discharge Planning Services: CM Consult Post Acute Care Choice: [place] arrangements for the past 2 months: Single Family Home            DME Arranged: N/A DME Agency: NA HH Arranged: RN [place] Agency: [place] Date [place]: 05/21/21 Representative spoke with at [place]: Levada Dy  Readmission Risk Interventions No flowsheet data found.

## 2021-05-23 NOTE — Progress Notes (Signed)
3 Days Post-Op  Subjective: CC: Denies significant abdominal pain, but does endorse some increased pressure.  NG tube has remained clamped for almost 24 hours without nausea, vomiting, or emesis.  Patient is having liquid stool via loop colostomy.  Mobilized with therapies yesterday.  Objective: Vital signs in last 24 hours: Temp:  [97.5 F (36.4 C)-98.9 F (37.2 C)] 97.5 F (36.4 C) (02/23 0609) Pulse Rate:  [96-105] 96 (02/23 0609) Resp:  [18] 18 (02/23 0609) BP: (116-134)/(64-72) 132/72 (02/23 0609) SpO2:  [93 %-98 %] 98 % (02/23 0609) Weight:  [105.3 kg] 105.3 kg (02/23 0500) Last BM Date : 05/22/21  Intake/Output from previous day: 02/22 0701 - 02/23 0700 In: 120 [P.O.:120] Out: 1400 [Urine:1300; Emesis/NG output:100] Intake/Output this shift: No intake/output data recorded.  PE: Gen:  Alert, NAD, pleasant Pulm: Rate and effort normal Abd: Obese but soft, there is mild abdominal distention, worse in the upper abdomen. Appropriately tender.  Wound VAC in place holding suction.  Liquid stool and gas in ostomy bag. Stoma retracted but viable w/ red rubber in place. Midline wound clean. Psych: A&Ox3    Lab Results:  Recent Labs    05/21/21 0437 05/22/21 0430  WBC 11.5* 14.9*  HGB 15.9* 12.9  HCT 48.8* 39.8  PLT 345 312   BMET Recent Labs    05/22/21 0430 05/23/21 0429  NA 135 133*  K 4.4 3.6  CL 109 108  CO2 19* 20*  GLUCOSE 150* 114*  BUN 19 20  CREATININE 1.75* 1.23*  CALCIUM 8.1* 8.2*   PT/INR No results for input(s): LABPROT, INR in the last 72 hours. CMP     Component Value Date/Time   NA 133 (L) 05/23/2021 0429   NA 141 10/10/2020 0949   K 3.6 05/23/2021 0429   CL 108 05/23/2021 0429   CO2 20 (L) 05/23/2021 0429   GLUCOSE 114 (H) 05/23/2021 0429   BUN 20 05/23/2021 0429   BUN 13 10/10/2020 0949   CREATININE 1.23 (H) 05/23/2021 0429   CALCIUM 8.2 (L) 05/23/2021 0429   PROT 4.3 (L) 05/22/2021 0430   PROT 6.8 10/10/2020 0949   ALBUMIN  1.9 (L) 05/22/2021 0430   ALBUMIN 4.4 10/10/2020 0949   AST 28 05/22/2021 0430   ALT 16 05/22/2021 0430   ALKPHOS 34 (L) 05/22/2021 0430   BILITOT 0.6 05/22/2021 0430   BILITOT 0.6 10/10/2020 0949   GFRNONAA 48 (L) 05/23/2021 0429   GFRAA 86 01/30/2020 1050   Lipase     Component Value Date/Time   LIPASE 31 05/17/2021 1632    Studies/Results: No results found.  Anti-infectives: Anti-infectives (From admission, onward)    Start     Dose/Rate Route Frequency Ordered Stop   05/21/21 1615  piperacillin-tazobactam (ZOSYN) IVPB 3.375 g        3.375 g 12.5 mL/hr over 240 Minutes Intravenous Every 8 hours 05/21/21 1526     05/20/21 0845  cefoTEtan (CEFOTAN) 2 g in sodium chloride 0.9 % 100 mL IVPB       Note to Pharmacy: Pharmacy to adjust dose as needed   2 g 200 mL/hr over 30 Minutes Intravenous On call to O.R. 05/20/21 0753 05/20/21 1436   05/18/21 1000  ciprofloxacin (CIPRO) IVPB 400 mg  Status:  Discontinued        400 mg 200 mL/hr over 60 Minutes Intravenous Every 12 hours 05/18/21 0556 05/21/21 1519   05/18/21 1000  metroNIDAZOLE (FLAGYL) IVPB 500 mg  Status:  Discontinued  500 mg 100 mL/hr over 60 Minutes Intravenous Every 12 hours 05/18/21 0556 05/21/21 1519   05/17/21 2200  ciprofloxacin (CIPRO) IVPB 400 mg        400 mg 200 mL/hr over 60 Minutes Intravenous  Once 05/17/21 2149 05/17/21 2320   05/17/21 2200  metroNIDAZOLE (FLAGYL) IVPB 500 mg        500 mg 100 mL/hr over 60 Minutes Intravenous  Once 05/17/21 2149 05/17/21 2315        Assessment/Plan POD 2 s/p Exploratory laparotomy, right hemicolectomy, loop sigmoid colostomy for Very distal colonic stricture extremely deep in the pelvis and perforated cecum - Dr. Kae Heller, 05/20/2021 - Cont IV abx x 5 days post op - NGT in place. High risk for ileus.  Tolerated NG clamping for 24 hours but now has increased abdominal distention and pressure.  Check residuals for 1 hour.  Suspect NG tube will need to remain in  place due to ongoing signs of ileus.  This plan was discussed with the patient's nurse, Nevin Bloodgood. - Monitor stoma. Retracted but viable. Cont red rubber - Negative pressure wound therapy M/W/F - WOCN following for new ostomy and vac - Mobilize, PT eval -Home health PT - Pulm toilet  FEN - NPO, NGT clamp trials.  Prerenal AKI improving, now over 10 L positive, would likely benefit from Lasix today.  Will discuss with hospitalist.   VTE - SCDs, Lovenox. Cont to hold plavix for now ID - Cipro/Flagyl 2/17-21, Zosyn 2/21 >> WBC up to 14.9 yesterday, afebrile, monitor; high risk for postoperative abscess, suspect she will need a repeat CT scan in the next couple days. Foley -removal per medical team in the setting of AKI and diuresis, may be removed from a surgical perspective. Monitor I/O  - Per TR[facility] - Hyperkalemia -improved AKI - Cr 1.75, improved  E. Coli UTI Hx CAD s/p DES 2021 - hold home plavix HTN HLD OSA Hx CHF  This care is post op   LOS: 6 days    Jill Alexanders , [facility] 05/23/2021, 9:49 AM Please see Amion for floor coverage during day hours 7:00am-4:30pm

## 2021-05-23 NOTE — Consult Note (Signed)
[place] Nurse ostomy consult note Stoma type/location: LLQ, loop colostomy  Stomal assessment/size: 1 3/8" slightly oval, flush, pink, moist, with apparent tension. Red rubber catheter in place for loop stoma support  Peristomal assessment: dips in the abdominal topography at 3 and 9 o'clock  Treatment options for stomal/peristomal skin: 2" skin barrier ring  Output; liquid brown stool  Ostomy pouching: 1pc flex convex with 2" skin barrier ring  Education provided: Explained role of ostomy nurse and creation of stoma  Explained stoma characteristics (budded, flush, color, texture, care) Discussed rationale for support rod and that we will remove prior to dc most likely  Demonstrated pouch change (cutting new skin barrier, measuring stoma, cleaning peristomal skin and stoma, use of barrier ring) Education on emptying when 1/3 to 1/2 full and how to empty Patient lives alone, has family in health care for support.  Enrolled patient in [place] program: Yes   [place] Nurse Consult Note: Reason for Consult:placement of NPWT dressing Wound type: surgical  Pressure Injury POA: NA Wound bed: moist, subcutaneous tissue Drainage (amount, consistency, odor) minimal, serosanguinous  Periwound: intact  Dressing procedure/placement/frequency: 1pc of black foam placed in the wound bed.  Seal obtained at 130mmHg Patient tolerated well, IV pain meds given prior to dressing application.   Explained rationale for use of NPWT dressing.  And role of HHRN at DC in dressing changes and support of ostomy care/teaching.   [place] Nurse will follow along with you for continued support with ostomy teaching and care, and NPWT dressing changes  Philis Doke Liane Comber MSN, RN, [place], [place], [place]

## 2021-05-23 NOTE — Progress Notes (Signed)
Physical Therapy Treatment Patient Details Name: Valerie Fields MRN: [id number] DOB: [birth year] Today's Date: 05/23/2021   History of Present Illness 71 yo female admitted with colonic obstruction. S/P hemicolectomy, colostomy, wound vac placement 2/22. Hx of NSTEMI, CAD, HF, OSA    PT Comments    Pt assisted with ambulating in hallway.  Pt pleased with ability to tolerate improved distance and reports less pain today (states feels more like "pressure").    Recommendations for follow up therapy are one component of a multi-disciplinary discharge planning process, led by the attending physician.  Recommendations may be updated based on patient status, additional functional criteria and insurance authorization.  Follow Up Recommendations  Home health PT     Assistance Recommended at Discharge PRN  Patient can return home with the following A little help with walking and/or transfers;A little help with bathing/dressing/bathroom   Equipment Recommendations  Rolling walker (2 wheels)    Recommendations for Other Services       Precautions / Restrictions Precautions Precautions: Fall Precaution Comments: L colostomy, NG tube, Wound Vac Restrictions Weight Bearing Restrictions: No     Mobility  Bed Mobility   Bed Mobility: Rolling, Sit to Sidelying Rolling: Supervision       Sit to sidelying: Supervision General bed mobility comments: cues for log roll technique    Transfers Overall transfer level: Needs assistance Equipment used: Rolling walker (2 wheels) Transfers: Sit to/from Stand Sit to Stand: Min guard           General transfer comment: verbal cues for hand placement and self assist    Ambulation/Gait Ambulation/Gait assistance: Min guard Gait Distance (Feet): 160 Feet Assistive device: Rolling walker (2 wheels) Gait Pattern/deviations: Step-through pattern, Decreased stride length       General Gait Details: slow but steady with RW,  encouarged upright posture as pain allows   Stairs             Wheelchair Mobility    Modified Rankin (Stroke Patients Only)       Balance                                            Cognition Arousal/Alertness: Awake/alert Behavior During Therapy: WFL for tasks assessed/performed Overall Cognitive Status: Within Functional Limits for tasks assessed                                          Exercises      General Comments        Pertinent Vitals/Pain Pain Assessment Pain Assessment: 0-10 Pain Score: 3  Pain Location: abdomen with activity Pain Descriptors / Indicators: Discomfort, Sore, Guarding Pain Intervention(s): Repositioned, Monitored during session    Home Living Family/patient expects to be discharged to:: Private residence Living Arrangements: Alone Available Help at Discharge: Family Type of Home: House Home Access: Stairs to enter   Technical brewer of Steps: 1   Home Layout: One level Home Equipment: Grab bars - tub/shower;Other (comment) (Adjustable Bed)      Prior Function            PT Goals (current goals can now be found in the care plan section) Progress towards PT goals: Progressing toward goals    Frequency    Min 3X/week  PT Plan Current plan remains appropriate    Co-evaluation              AM-PAC PT "6 Clicks" Mobility   Outcome Measure  Help needed turning from your back to your side while in a flat bed without using bedrails?: A Little Help needed moving from lying on your back to sitting on the side of a flat bed without using bedrails?: A Little Help needed moving to and from a bed to a chair (including a wheelchair)?: A Little Help needed standing up from a chair using your arms (e.g., wheelchair or bedside chair)?: A Little Help needed to walk in hospital room?: A Little Help needed climbing 3-5 steps with a railing? : A Little 6 Click Score: 18    End  of Session   Activity Tolerance: Patient tolerated treatment well Patient left: in bed;with call bell/phone within reach;with bed alarm set Nurse Communication: Mobility status PT Visit Diagnosis: Other abnormalities of gait and mobility (R26.89);Difficulty in walking, not elsewhere classified (R26.2)     Time: 4967-5916 PT Time Calculation (min) (ACUTE ONLY): 22 min  Charges:  $Gait Training: 8-22 mins                     Jannette Spanner PT, DPT Acute Rehabilitation Services Pager: [phone] Office: [place] 05/23/2021, 12:52 PM

## 2021-05-23 NOTE — Consult Note (Signed)
Kelly Nurse ostomy follow up Stoma type/location: LLQ, loop colostomy  Pouch intact from change yesterday Output brown, thin Ostomy pouching: 1pc.flex convex; 2" skin barrier  Education provided: no questions from patient today. [facility] is her bedside nurse; will encourage her to empty if she can. Enrolled patient in [place] Start Discharge program: Yes  Will plan NPWT and pouch change in the am; patient aware.  Cade, Kevin, [place]

## 2021-05-24 DIAGNOSIS — I5042 Chronic combined systolic (congestive) and diastolic (congestive) heart failure: Secondary | ICD-10-CM | POA: Diagnosis not present

## 2021-05-24 DIAGNOSIS — K56609 Unspecified intestinal obstruction, unspecified as to partial versus complete obstruction: Secondary | ICD-10-CM | POA: Diagnosis not present

## 2021-05-24 DIAGNOSIS — E876 Hypokalemia: Secondary | ICD-10-CM | POA: Diagnosis not present

## 2021-05-24 DIAGNOSIS — K3532 Acute appendicitis with perforation and localized peritonitis, without abscess: Secondary | ICD-10-CM

## 2021-05-24 DIAGNOSIS — Z9889 Other specified postprocedural states: Secondary | ICD-10-CM | POA: Diagnosis not present

## 2021-05-24 LAB — BASIC METABOLIC PANEL
Anion gap: 5 (ref 5–15)
BUN: 19 mg/dL (ref 8–23)
CO2: 22 mmol/L (ref 22–32)
Calcium: 8.6 mg/dL — ABNORMAL LOW (ref 8.9–10.3)
Chloride: 110 mmol/L (ref 98–111)
Creatinine, Ser: 0.89 mg/dL (ref 0.44–1.00)
GFR, Estimated: >60 mL/min (ref >60)
Glucose, Bld: 85 mg/dL (ref 70–99)
Potassium: 3.3 mmol/L — ABNORMAL LOW (ref 3.5–5.1)
Sodium: 137 mmol/L (ref 135–145)

## 2021-05-24 LAB — CBC
HCT: 38.4 % (ref 36.0–46.0)
Hemoglobin: 12.7 g/dL (ref 12.0–15.0)
MCH: 31.9 pg (ref 26.0–34.0)
MCHC: 33.1 g/dL (ref 30.0–36.0)
MCV: 96.5 fL (ref 80.0–100.0)
Platelets: 293 10*3/uL (ref 150–400)
RBC: 3.98 MIL/uL (ref 3.87–5.11)
RDW: 14.3 % (ref 11.5–15.5)
WBC: 11.7 10*3/uL — ABNORMAL HIGH (ref 4.0–10.5)
nRBC: 0 % (ref 0.0–0.2)

## 2021-05-24 LAB — MAGNESIUM: Magnesium: 2.3 mg/dL (ref 1.7–2.4)

## 2021-05-24 MED ORDER — DEXTROSE 5 % IV SOLN
INTRAVENOUS | Status: DC
  Filled 2021-05-24 (×3): qty 1000

## 2021-05-24 MED ORDER — POTASSIUM CHLORIDE 10 MEQ/100ML IV SOLN
10.0000 meq | INTRAVENOUS | Status: AC
  Administered 2021-05-24 (×4): 10 meq via INTRAVENOUS
  Filled 2021-05-24 (×4): qty 100

## 2021-05-24 NOTE — Progress Notes (Signed)
Physical Therapy Treatment Patient Details Name: Valerie Fields MRN: [id number] DOB: 1950/05/21 Today's Date: 05/24/2021   History of Present Illness 71 yo female admitted with colonic obstruction. S/P hemicolectomy, colostomy, wound vac placement 2/22. Hx of NSTEMI, CAD, HF, OSA    PT Comments    Pt reports having a busy morning however agreeable to ambulate this afternoon.  Pt assisted with ambulating and encouraged improving distance a little each time. Pt would benefit from ambulating multiple times/day with staff.  Pt does reports she intends to be up more now to go to the bathroom now that foley has been removed.   Recommendations for follow up therapy are one component of a multi-disciplinary discharge planning process, led by the attending physician.  Recommendations may be updated based on patient status, additional functional criteria and insurance authorization.  Follow Up Recommendations  Home health PT     Assistance Recommended at Discharge PRN  Patient can return home with the following A little help with walking and/or transfers;A little help with bathing/dressing/bathroom   Equipment Recommendations  Rolling walker (2 wheels)    Recommendations for Other Services       Precautions / Restrictions Precautions Precautions: Fall Precaution Comments: L colostomy, NG tube, Wound Vac     Mobility  Bed Mobility Overal bed mobility: Needs Assistance Bed Mobility: Sit to Supine           General bed mobility comments: managed lines for pt, pt returning to bed before prompt for log roll technique so discussed log roll technique for bed mobility to assist with pain and healing    Transfers Overall transfer level: Needs assistance Equipment used: Rolling walker (2 wheels) Transfers: Sit to/from Stand Sit to Stand: Min guard           General transfer comment: verbal cues for hand placement and self assist    Ambulation/Gait Ambulation/Gait  assistance: Min guard Gait Distance (Feet): 180 Feet Assistive device: Rolling walker (2 wheels) Gait Pattern/deviations: Step-through pattern, Decreased stride length       General Gait Details: slow but steady with RW, improved posture observed today   Stairs             Wheelchair Mobility    Modified Rankin (Stroke Patients Only)       Balance                                            Cognition Arousal/Alertness: Awake/alert Behavior During Therapy: WFL for tasks assessed/performed Overall Cognitive Status: Within Functional Limits for tasks assessed                                          Exercises      General Comments        Pertinent Vitals/Pain Pain Assessment Pain Assessment: 0-10 Pain Score: 4  Pain Location: abdomen with activity Pain Descriptors / Indicators: Discomfort, Sore Pain Intervention(s): Monitored during session, Repositioned    Home Living                          Prior Function            PT Goals (current goals can now be found in the care plan section) Progress towards  PT goals: Progressing toward goals    Frequency    Min 3X/week      PT Plan Current plan remains appropriate    Co-evaluation              AM-PAC PT "6 Clicks" Mobility   Outcome Measure  Help needed turning from your back to your side while in a flat bed without using bedrails?: A Little Help needed moving from lying on your back to sitting on the side of a flat bed without using bedrails?: A Little Help needed moving to and from a bed to a chair (including a wheelchair)?: A Little Help needed standing up from a chair using your arms (e.g., wheelchair or bedside chair)?: A Little Help needed to walk in hospital room?: A Little Help needed climbing 3-5 steps with a railing? : A Little 6 Click Score: 18    End of Session   Activity Tolerance: Patient tolerated treatment well Patient left:  with call bell/phone within reach;in bed Nurse Communication: Mobility status PT Visit Diagnosis: Other abnormalities of gait and mobility (R26.89);Difficulty in walking, not elsewhere classified (R26.2)     Time: 1400-1416 PT Time Calculation (min) (ACUTE ONLY): 16 min  Charges:  $Gait Training: 8-22 mins                     Jannette Spanner PT, DPT Acute Rehabilitation Services Pager: [phone] Office: [place] 05/24/2021, 4:41 PM

## 2021-05-24 NOTE — Progress Notes (Signed)
Patient with two episodes of mucous rectal discharge overnight. No blood or puss noted in discharge.

## 2021-05-24 NOTE — Progress Notes (Addendum)
4 Days Post-Op  Subjective: CC: Feeling well this morning.  Copious stool output.  Up to the chair yesterday but not much walking.  Objective: Vital signs in last 24 hours: Temp:  [98.2 F (36.8 C)-98.3 F (36.8 C)] 98.2 F (36.8 C) (02/24 0549) Pulse Rate:  [86-92] 92 (02/24 0549) Resp:  [16-18] 18 (02/24 0549) BP: (117-133)/(53-94) 133/68 (02/24 0549) SpO2:  [94 %-95 %] 94 % (02/24 0549) Last BM Date : 05/23/21  Intake/Output from previous day: 02/23 0701 - 02/24 0700 In: 22 [IV Piggyback:50] Out: 4650 [Urine:2100; Emesis/NG output:1950; Stool:600] Intake/Output this shift: No intake/output data recorded.  PE: Gen:  Alert, NAD, pleasant Pulm: Rate and effort normal Abd: Obese but soft, there is mild abdominal distention, worse in the upper abdomen. Appropriately tender.  Wound VAC in place holding suction.  Liquid stool and gas in ostomy bag. Stoma retracted and not visible through the bag w/ red rubber in place.  NG output appears to be high-volume but the patient is drinking several cups of water and liquids Psych: A&Ox3    Lab Results:  Recent Labs    05/22/21 0430 05/24/21 0430  WBC 14.9* 11.7*  HGB 12.9 12.7  HCT 39.8 38.4  PLT 312 293    BMET Recent Labs    05/23/21 0429 05/24/21 0430  NA 133* 137  K 3.6 3.3*  CL 108 110  CO2 20* 22  GLUCOSE 114* 85  BUN 20 19  CREATININE 1.23* 0.89  CALCIUM 8.2* 8.6*    PT/INR No results for input(s): LABPROT, INR in the last 72 hours. CMP     Component Value Date/Time   NA 137 05/24/2021 0430   NA 141 10/10/2020 0949   K 3.3 (L) 05/24/2021 0430   CL 110 05/24/2021 0430   CO2 22 05/24/2021 0430   GLUCOSE 85 05/24/2021 0430   BUN 19 05/24/2021 0430   BUN 13 10/10/2020 0949   CREATININE 0.89 05/24/2021 0430   CALCIUM 8.6 (L) 05/24/2021 0430   PROT 4.3 (L) 05/22/2021 0430   PROT 6.8 10/10/2020 0949   ALBUMIN 1.9 (L) 05/22/2021 0430   ALBUMIN 4.4 10/10/2020 0949   AST 28 05/22/2021 0430   ALT 16  05/22/2021 0430   ALKPHOS 34 (L) 05/22/2021 0430   BILITOT 0.6 05/22/2021 0430   BILITOT 0.6 10/10/2020 0949   GFRNONAA >60 05/24/2021 0430   GFRAA 86 01/30/2020 1050   Lipase     Component Value Date/Time   LIPASE 31 05/17/2021 1632    Studies/Results: DG CHEST PORT 1 VIEW  Result Date: 05/23/2021 CLINICAL DATA:  Dyspnea R06.00 (ICD-10-CM) EXAM: PORTABLE CHEST 1 VIEW COMPARISON:  10/13/2019. FINDINGS: Low lung volumes with mild bibasilar opacities. No visible pleural effusions or pneumothorax. Cardiac silhouette is accentuated by low lung volumes and AP technique. Gastric tube courses below the diaphragm with the tip outside the field of view. IMPRESSION: Low lung volumes with mild bibasilar opacities, which could represent atelectasis, aspiration, and/or pneumonia. Dedicated PA and lateral radiographs could further characterize if clinically indicated. Electronically Signed   By: Margaretha Sheffield M.D.   On: 05/23/2021 11:22    Anti-infectives: Anti-infectives (From admission, onward)    Start     Dose/Rate Route Frequency Ordered Stop   05/21/21 1615  piperacillin-tazobactam (ZOSYN) IVPB 3.375 g        3.375 g 12.5 mL/hr over 240 Minutes Intravenous Every 8 hours 05/21/21 1526     05/20/21 0845  cefoTEtan (CEFOTAN) 2 g in  sodium chloride 0.9 % 100 mL IVPB       Note to Pharmacy: Pharmacy to adjust dose as needed   2 g 200 mL/hr over 30 Minutes Intravenous On call to O.R. 05/20/21 0753 05/20/21 1436   05/18/21 1000  ciprofloxacin (CIPRO) IVPB 400 mg  Status:  Discontinued        400 mg 200 mL/hr over 60 Minutes Intravenous Every 12 hours 05/18/21 0556 05/21/21 1519   05/18/21 1000  metroNIDAZOLE (FLAGYL) IVPB 500 mg  Status:  Discontinued        500 mg 100 mL/hr over 60 Minutes Intravenous Every 12 hours 05/18/21 0556 05/21/21 1519   05/17/21 2200  ciprofloxacin (CIPRO) IVPB 400 mg        400 mg 200 mL/hr over 60 Minutes Intravenous  Once 05/17/21 2149 05/17/21 2320   05/17/21  2200  metroNIDAZOLE (FLAGYL) IVPB 500 mg        500 mg 100 mL/hr over 60 Minutes Intravenous  Once 05/17/21 2149 05/17/21 2315        Assessment/Plan POD 4 s/p Exploratory laparotomy, right hemicolectomy, loop sigmoid colostomy for Very distal colonic stricture extremely deep in the pelvis and perforated cecum - Dr. Kae Heller, 05/20/2021 - Cont IV abx x 5 days post op -High risk for ileus, having copious stool output and distention improving, will try clamping NG tube today and continue sips of clears. - Monitor stoma. Retracted but viable. Cont red rubber-remove early next week - Negative pressure wound therapy M/W/F - WOCN following for new ostomy and vac - Mobilize, PT eval -Home health PT - Pulm toilet  FEN - NPO, NGT clamp trials.    VTE - SCDs, Lovenox. Cont to hold plavix for now ID - Cipro/Flagyl 2/17-21, Zosyn 2/21 >> WBC up to 14.9 yesterday, afebrile, monitor; high risk for postoperative abscess, suspect she will need a repeat CT scan in the next couple days. Foley -remove Foley today  - Per [facility] - Hypokalemia-replacing IV AKI -resolved E. Coli UTI Hx CAD s/p DES 2021 - hold home plavix HTN HLD OSA Hx CHF  Ultimately once recovered she will need a complete colonoscopy to evaluate the stricture, will need biopsies, given how low this is.  Will need follow-up with colorectal surgeon.   LOS: 7 days    Clovis Riley , [facility] 05/24/2021, 9:54 AM Please see Amion for floor coverage during day hours 7:00am-4:30pm

## 2021-05-24 NOTE — Progress Notes (Signed)
PHARMACY NOTE -  zosyn  Pharmacy has been assisting with dosing of zosyn for intra-abdominal infection. Dosage remains stable at 3.375 gm IV q8h (infuse over 4hrs). Scr has improved to 0.89 (crcl~68).  The need for further dosage adjustment appears unlikely at present.    Will sign off at this time.  Please reconsult if a change in clinical status warrants re-evaluation of dosage.  Dia Sitter, PharmD, BCPS 05/24/2021 10:55 AM

## 2021-05-24 NOTE — Progress Notes (Signed)
[facility] PROGRESS NOTE    Progress Note  Valerie Fields  WCH:[id number] DOB: [birth year] DOA: 05/17/2021 PCP: Marda Stalker, PA-C     Brief Narrative:   Valerie Fields is an 71 y.o. female past medical history coronary artery disease with a history of NSTEMI cath in 2021 that showed a single-vessel disease and high-grade LAD stenosis status post drug-eluting stents, combined systolic and diastolic heart failure with an EF of 60% by 2D echo on 04/24/2021, obstructive sleep apnea presents to the ER from the GI office due to multiple episodes of abdominal pain and bloating there was a concern for acute diverticulitis flare patient started having symptoms about 10 days prior to admission started on Augmentin for UTI saw GI in February 2016 for abdominal pain started on demeclocycline in the ED was seen by GI and general surgery had a flex sig, underwent Perative management in 05/20/2021 with a right colectomy and loop sigmoid colostomy this had to be doing openly due to distal colonic stricture in the pelvis and had a perforated cecum for which she developed feculent peritonitis at the time of surgery.She was started on IV antibiotics Cipro and Flagyl on 05/17/2021, transition to IV Zosyn on 05/21/2021 as she is at high risk of developing abscess.   Assessment/Plan:   High-grade benign distal sigmoid stricture with perforation probable diverticular stricture of the sigmoid/Status post exploratory laparotomy with right hemicolectomy, loop sigmoid colostomy and a perforated cecum on 05/20/2021: Continue to hold Plavix, surgery to dictate when to restart. Tmax of 98.3, her white blood cell count this morning is 11.7. Further management per surgery we will continue IV antibiotics. There is a concern that she probably has an ileus. Resume IV fluids. Physical therapy evaluated the patient recommended home health PT.  Try to keep potassium greater than 4 magnesium greater than  2. Physical therapy evaluated the patient recommended home health PT.  Acute kidney injury: Likely prerenal azotemia, in the setting of ACE inhibitor. She is positive for 4-1/2 L, she had brisk urine output with about 2 L overnight. Continue to hold diuretic therapy. She has been weaned to room air. Chest x-ray shows no pulmonary edema. Avoid IV Lasix as she is auto-diuresing well.  Leukocytosis: Likely due to infectious etiology has remained febrile continue IV Zosyn.  Chronic combined systolic and diastolic heart failure: Last 2D echo in 2021 showed an EF of 60% with grade 2 diastolic heart failure. She was stabilized she is becoming slightly tachycardic. Continue beta-blockers keep fluids KVO.  History of coronary artery disease with a DES stent to the LAD: Holding Plavix surgery to dictate when to restart.  Essential hypertension: Continue to hold losartan blood pressure stable tachycardia improved with IV metoprolol.  Hypokalemia: Replete IV recheck in the morning, her magnesium is greater than 2 trachea potassium greater than 4.  Morbid obesity: Counseling  DVT prophylaxis: scd's Family Communication:sister Status is: Inpatient Remains inpatient appropriate because: Perforated colon     Code Status:     Code Status Orders  (From admission, onward)           Start     Ordered   05/18/21 0729  Full code  Continuous        05/18/21 0729           Code Status History     Date Active Date Inactive Code Status Order ID Comments User Context   05/17/2021 2255 05/18/2021 0729 Full Code [id number]  Kristopher Oppenheim, DO ED  10/13/2019 1348 10/15/2019 1603 Full Code [id number]  Nelva Bush, MD Inpatient      Advance Directive Documentation    Flowsheet Row Most Recent Value  Type of Advance Directive Healthcare Power of [place], Living will  Pre-existing out of facility DNR order (yellow form or pink MOST form) --  "MOST" Form in Place? --          IV Access:   Peripheral IV   Procedures and diagnostic studies:   DG CHEST PORT 1 VIEW  Result Date: 05/23/2021 CLINICAL DATA:  Dyspnea R06.00 (ICD-10-CM) EXAM: PORTABLE CHEST 1 VIEW COMPARISON:  10/13/2019. FINDINGS: Low lung volumes with mild bibasilar opacities. No visible pleural effusions or pneumothorax. Cardiac silhouette is accentuated by low lung volumes and AP technique. Gastric tube courses below the diaphragm with the tip outside the field of view. IMPRESSION: Low lung volumes with mild bibasilar opacities, which could represent atelectasis, aspiration, and/or pneumonia. Dedicated PA and lateral radiographs could further characterize if clinically indicated. Electronically Signed   By: Margaretha Sheffield M.D.   On: 05/23/2021 11:22     Medical Consultants:   None.   Subjective:    Valerie Fields had a bowel movement feels better relates she has an appetite would like the NG tube out. Objective:    Vitals:   05/23/21 1255 05/23/21 1351 05/23/21 2144 05/24/21 0549  BP: (!) 123/57 (!) 120/53 (!) 117/94 133/68  Pulse: 86 86 92 92  Resp:  16 18 18   Temp:  98.3 F (36.8 C) 98.3 F (36.8 C) 98.2 F (36.8 C)  TempSrc:  Oral Oral Oral  SpO2:  95% 95% 94%  Weight:      Height:       SpO2: 94 % O2 Flow Rate (L/min): 2 L/min   Intake/Output Summary (Last 24 hours) at 05/24/2021 0746 Last data filed at 05/24/2021 0600 Gross per 24 hour  Intake 50 ml  Output 4650 ml  Net -4600 ml    Filed Weights   05/20/21 1214 05/22/21 0500 05/23/21 0500  Weight: 96.3 kg 103.6 kg 105.3 kg    Exam: General exam: In no acute distress. Respiratory system: Good air movement and crackles on the right lung base left is clear Cardiovascular system: S1 & S2 heard, RRR. No JVD. Gastrointestinal system: Abdomen is is nondistended nontender with wound VAC in place. Extremities: No pedal edema. Skin: No rashes, lesions or ulcers Psychiatry: Judgement and insight appear  normal. Mood & affect appropriate.   Data Reviewed:    Labs: Basic Metabolic Panel: Recent Labs  Lab 05/21/21 0437 05/21/21 1622 05/22/21 0430 05/23/21 0429 05/24/21 0430  NA 134* 134* 135 133* 137  K 5.7* 4.5 4.4 3.6 3.3*  CL 107 107 109 108 110  CO2 18* 20* 19* 20* 22  GLUCOSE 189* 142* 150* 114* 85  BUN 10 16 19 20 19   CREATININE 1.89* 2.04* 1.75* 1.23* 0.89  CALCIUM 8.5* 8.3* 8.1* 8.2* 8.6*  MG 1.9 1.7 1.8 2.4 2.3    GFR Estimated Creatinine Clearance: 68 mL/min (by C-G formula based on SCr of 0.89 mg/dL). Liver Function Tests: Recent Labs  Lab 05/19/21 0436 05/20/21 0410 05/21/21 0437 05/21/21 1622 05/22/21 0430  AST 17 16 28 30 28   ALT 17 16 16 15 16   ALKPHOS 49 48 34* 32* 34*  BILITOT 0.6 0.7 0.8 0.3 0.6  PROT 6.0* 6.0* 4.6* 4.5* 4.3*  ALBUMIN 3.1* 3.0* 2.1* 2.0* 1.9*    Recent Labs  Lab 05/17/21 1632  LIPASE 31    No results for input(s): AMMONIA in the last 168 hours. Coagulation profile No results for input(s): INR, PROTIME in the last 168 hours. COVID-19 Labs  No results for input(s): DDIMER, FERRITIN, LDH, CRP in the last 72 hours.  Lab Results  Component Value Date   SARSCOV2NAA NEGATIVE 05/17/2021   [place] Not Detected 12/09/2019   [place] NEGATIVE 10/13/2019    CBC: Recent Labs  Lab 05/17/21 1632 05/18/21 0740 05/20/21 0410 05/21/21 0437 05/22/21 0430 05/24/21 0430  WBC 13.8* 11.2* 12.1* 11.5* 14.9* 11.7*  NEUTROABS 10.6* 8.2* 9.3* 9.5*  --   --   HGB 15.8* 13.3 12.5 15.9* 12.9 12.7  HCT 45.5 39.3 36.7 48.8* 39.8 38.4  MCV 92.7 94.2 94.6 99.8 99.3 96.5  PLT 395 322 298 345 312 293    Cardiac Enzymes: No results for input(s): CKTOTAL, CKMB, CKMBINDEX, TROPONINI in the last 168 hours. BNP (last 3 results) No results for input(s): PROBNP in the last 8760 hours. CBG: No results for input(s): GLUCAP in the last 168 hours. D-Dimer: No results for input(s): DDIMER in the last 72 hours. Hgb A1c: No results for  input(s): HGBA1C in the last 72 hours. Lipid Profile: No results for input(s): CHOL, HDL, LDLCALC, TRIG, CHOLHDL, LDLDIRECT in the last 72 hours. Thyroid function studies: No results for input(s): TSH, T4TOTAL, T3FREE, THYROIDAB in the last 72 hours.  Invalid input(s): FREET3 Anemia work up: No results for input(s): VITAMINB12, FOLATE, FERRITIN, TIBC, IRON, RETICCTPCT in the last 72 hours. Sepsis Labs: Recent Labs  Lab 05/20/21 0410 05/21/21 0437 05/22/21 0430 05/24/21 0430  WBC 12.1* 11.5* 14.9* 11.7*    Microbiology Recent Results (from the past 240 hour(s))  Resp Panel by RT-PCR (Flu A&B, Covid) Urine, Clean Catch     Status: None   Collection Time: 05/17/21  4:32 PM   Specimen: Urine, Clean Catch; Nasopharyngeal(NP) swabs in vial transport medium  Result Value Ref Range Status   SARS Coronavirus 2 by RT PCR NEGATIVE NEGATIVE Final    Comment: (NOTE) SARS-CoV-2 target nucleic acids are NOT DETECTED.  The SARS-CoV-2 RNA is generally detectable in upper respiratory specimens during the acute phase of infection. The lowest concentration of SARS-CoV-2 viral copies this assay can detect is 138 copies/mL. A negative result does not preclude SARS-Cov-2 infection and should not be used as the sole basis for treatment or other patient management decisions. A negative result may occur with  improper specimen collection/handling, submission of specimen other than nasopharyngeal swab, presence of viral mutation(s) within the areas targeted by this assay, and inadequate number of viral copies(<138 copies/mL). A negative result must be combined with clinical observations, patient history, and epidemiological information. The expected result is Negative.  Fact Sheet for Patients:  EntrepreneurPulse.com.au  Fact Sheet for Healthcare Providers:  IncredibleEmployment.be  This test is no t yet approved or cleared by the [place] FDA and  has  been authorized for detection and/or diagnosis of SARS-CoV-2 by FDA under an Emergency Use Authorization (EUA). This EUA will remain  in effect (meaning this test can be used) for the duration of the COVID-19 declaration under Section 564(b)(1) of the Act, 21 U.S.C.section 360bbb-3(b)(1), unless the authorization is terminated  or revoked sooner.       Influenza A by PCR NEGATIVE NEGATIVE Final   Influenza B by PCR NEGATIVE NEGATIVE Final    Comment: (NOTE) The Xpert Xpress SARS-CoV-2/FLU/RSV plus assay is intended as an aid in the diagnosis of influenza from Nasopharyngeal  swab specimens and should not be used as a sole basis for treatment. Nasal washings and aspirates are unacceptable for Xpert Xpress SARS-CoV-2/FLU/RSV testing.  Fact Sheet for Patients: EntrepreneurPulse.com.au  Fact Sheet for Healthcare Providers: IncredibleEmployment.be  This test is not yet approved or cleared by the [place] FDA and has been authorized for detection and/or diagnosis of SARS-CoV-2 by FDA under an Emergency Use Authorization (EUA). This EUA will remain in effect (meaning this test can be used) for the duration of the COVID-19 declaration under Section 564(b)(1) of the Act, 21 U.S.C. section 360bbb-3(b)(1), unless the authorization is terminated or revoked.  Performed at [facility], [place] [address], [place], [place] [address]   Urine Culture     Status: Abnormal   Collection Time: 05/17/21 10:27 PM   Specimen: Urine, Clean Catch  Result Value Ref Range Status   Specimen Description   Final    URINE, CLEAN CATCH Performed at [facility], [place] [address], [place], [place] [address]    Special Requests   Final    NONE Performed at [facility], [place] [address] [place], [place] [address]    Culture >=100,000 COLONIES/mL ESCHERICHIA COLI (A)  Final   Report Status 05/20/2021 FINAL  Final    Organism ID, Bacteria ESCHERICHIA COLI (A)  Final      Susceptibility   Escherichia coli - MIC*    AMPICILLIN 8 SENSITIVE Sensitive     CEFAZOLIN <=4 SENSITIVE Sensitive     CEFEPIME <=0.12 SENSITIVE Sensitive     CEFTRIAXONE <=0.25 SENSITIVE Sensitive     CIPROFLOXACIN <=0.25 SENSITIVE Sensitive     GENTAMICIN <=1 SENSITIVE Sensitive     IMIPENEM <=0.25 SENSITIVE Sensitive     NITROFURANTOIN <=16 SENSITIVE Sensitive     TRIMETH/SULFA <=20 SENSITIVE Sensitive     AMPICILLIN/SULBACTAM 4 SENSITIVE Sensitive     PIP/TAZO <=4 SENSITIVE Sensitive     * >=100,000 COLONIES/mL ESCHERICHIA COLI  C Difficile Quick Screen w PCR reflex     Status: None   Collection Time: 05/18/21 10:00 AM  Result Value Ref Range Status   C Diff antigen NEGATIVE NEGATIVE Final   C Diff toxin NEGATIVE NEGATIVE Final   C Diff interpretation No C. difficile detected.  Final    Comment: Performed at [facility], [place] [address], [place], [place] [address]     Medications:    acetaminophen  650 mg Oral Q6H   atorvastatin  80 mg Oral Daily   Chlorhexidine Gluconate Cloth  6 each Topical Daily   enoxaparin (LOVENOX) injection  40 mg Subcutaneous Q24H   metoprolol tartrate  2.5 mg Intravenous Q6H   montelukast  10 mg Oral QHS   pantoprazole  40 mg Oral BID   Continuous Infusions:  lactated ringers 10 mL/hr at 05/20/21 1226   methocarbamol (ROBAXIN) IV     piperacillin-tazobactam (ZOSYN)  IV 3.375 g (05/24/21 0611)      LOS: 7 days   Charlynne Cousins  [facility]  05/24/2021, 7:46 AM

## 2021-05-24 NOTE — Consult Note (Signed)
[place] Nurse wound follow up Wound type: surgical Measurement:22cm x 5cm x 6cm  Wound bed: pink, moist, subcutaneous tissue Drainage (amount, consistency, odor) none Periwound: intact  Dressing procedure/placement/frequency: Removed old NPWT dressing Filled wound with  _1__ piece of black foam Sealed NPWT dressing at 182mm HG Patient received pain medication after dressing per patient request Patient tolerated procedure well CCS PA Michael at the bedside for assessment of wound and ostomy, images taken   [place] will continue to provide NPWT dressing changed due to the complexity of the dressing change.   [place] Nurse ostomy follow up Stoma type/location: LLQ, loop colostomy Stomal assessment/size: oval shaped, 1 3/8 side to side aprox 1 1/4" top to bottom, flush Peristomal assessment: intact, tension, dips at 3 and 9 o'clock  Treatment options for stomal/peristomal skin: 2" skin barrier ring to flatten abdominal surface  Output liquid green, seedy  Ostomy pouching: 1pc.flex convex with 2" skin barrier  Education provided:  Patient's friend (retired Marine scientist) Manuela Schwartz is at the bedside. She has been retired only 4 years and was a Teacher, early years/pre  Explained stoma characteristics (budded, flush, color, texture, care) Discussed  use of wick to clean spout  Patient closed new pouch using lock and roll closure.  I brought patient a mirror today, this is the only way she can see her stoma. Will need assistance at home navigating changing pouch in the bathroom in front of a mirror May need to add belt once she is sitting up more.  HHRN to assist with continued education at home Support at home from RN friend Manuela Schwartz.    Enrolled patient in [place] Start Discharge program: Yes    [place] Nurse will follow along with you for continued support with ostomy teaching and care and NPWT dressing change K[place]oa Fyock [facility] MSN, [place], [place], [place], [place]

## 2021-05-25 DIAGNOSIS — K3532 Acute appendicitis with perforation and localized peritonitis, without abscess: Secondary | ICD-10-CM | POA: Diagnosis not present

## 2021-05-25 DIAGNOSIS — E876 Hypokalemia: Secondary | ICD-10-CM | POA: Diagnosis not present

## 2021-05-25 DIAGNOSIS — K56609 Unspecified intestinal obstruction, unspecified as to partial versus complete obstruction: Secondary | ICD-10-CM | POA: Diagnosis not present

## 2021-05-25 DIAGNOSIS — I5042 Chronic combined systolic (congestive) and diastolic (congestive) heart failure: Secondary | ICD-10-CM | POA: Diagnosis not present

## 2021-05-25 LAB — BASIC METABOLIC PANEL
Anion gap: 8 (ref 5–15)
BUN: 12 mg/dL (ref 8–23)
CO2: 23 mmol/L (ref 22–32)
Calcium: 8.5 mg/dL — ABNORMAL LOW (ref 8.9–10.3)
Chloride: 103 mmol/L (ref 98–111)
Creatinine, Ser: 0.58 mg/dL (ref 0.44–1.00)
GFR, Estimated: >60 mL/min (ref >60)
Glucose, Bld: 119 mg/dL — ABNORMAL HIGH (ref 70–99)
Potassium: 3 mmol/L — ABNORMAL LOW (ref 3.5–5.1)
Sodium: 134 mmol/L — ABNORMAL LOW (ref 135–145)

## 2021-05-25 MED ORDER — POTASSIUM CHLORIDE 10 MEQ/100ML IV SOLN
10.0000 meq | INTRAVENOUS | Status: AC
  Administered 2021-05-25 (×4): 10 meq via INTRAVENOUS
  Filled 2021-05-25 (×4): qty 100

## 2021-05-25 MED ORDER — POTASSIUM CHLORIDE 2 MEQ/ML IV SOLN
INTRAVENOUS | Status: DC
  Filled 2021-05-25 (×2): qty 1000

## 2021-05-25 MED ORDER — POTASSIUM CHLORIDE 10 MEQ/100ML IV SOLN: 10.0000 meq | INTRAVENOUS | Status: DC

## 2021-05-25 NOTE — Progress Notes (Signed)
°   05/25/21 1200  Mobility  Activity Ambulated with assistance in hallway  Level of Assistance Contact guard assist, steadying assist  Assistive Device Front wheel walker  Distance Ambulated (ft) 300 ft  Activity Response Tolerated well  $Mobility charge 1 Mobility   Pt agreeable to mobilize this morning. Ambulated about 363ft in hall with RW, tolerated well. Pt noted 5-6/10 pain, but otherwise no complaints. Left pt in bed, call bell at side. RN/NT notified of session.   [place] Specialist Acute Rehab Services Office: [phone]

## 2021-05-25 NOTE — Progress Notes (Signed)
5 Days Post-Op   Subjective/Chief Complaint: No complaints. Passing flatus   Objective: Vital signs in last 24 hours: Temp:  [98.5 F (36.9 C)-99.1 F (37.3 C)] 98.6 F (37 C) (02/25 0700) Pulse Rate:  [80-100] 88 (02/25 0700) Resp:  [16-18] 18 (02/25 0700) BP: (113-147)/(62-73) 144/65 (02/25 0700) SpO2:  [91 %-96 %] 95 % (02/25 0700) Weight:  [100.4 kg] 100.4 kg (02/25 0502) Last BM Date : 05/25/21  Intake/Output from previous day: 02/24 0701 - 02/25 0700 In: 1407.1 [P.O.:60; I.V.:797.2; IV Piggyback:549.9] Out: 1970 [Urine:1000; Emesis/NG output:570; Stool:400] Intake/Output this shift: Total I/O In: 815 [P.O.:815] Out: 0   General appearance: alert and cooperative Resp: clear to auscultation bilaterally Cardio: regular rate and rhythm GI: soft, minimal tenderness. Ostomy productive with red rubber in place  Lab Results:  Recent Labs    05/24/21 0430  WBC 11.7*  HGB 12.7  HCT 38.4  PLT 293   BMET Recent Labs    05/24/21 0430 05/25/21 1000  NA 137 134*  K 3.3* 3.0*  CL 110 103  CO2 22 23  GLUCOSE 85 119*  BUN 19 12  CREATININE 0.89 0.58  CALCIUM 8.6* 8.5*   PT/INR No results for input(s): LABPROT, INR in the last 72 hours. ABG No results for input(s): PHART, HCO3 in the last 72 hours.  Invalid input(s): PCO2, PO2  Studies/Results: DG CHEST PORT 1 VIEW  Result Date: 05/23/2021 CLINICAL DATA:  Dyspnea R06.00 (ICD-10-CM) EXAM: PORTABLE CHEST 1 VIEW COMPARISON:  10/13/2019. FINDINGS: Low lung volumes with mild bibasilar opacities. No visible pleural effusions or pneumothorax. Cardiac silhouette is accentuated by low lung volumes and AP technique. Gastric tube courses below the diaphragm with the tip outside the field of view. IMPRESSION: Low lung volumes with mild bibasilar opacities, which could represent atelectasis, aspiration, and/or pneumonia. Dedicated PA and lateral radiographs could further characterize if clinically indicated. Electronically  Signed   By: Margaretha Sheffield M.D.   On: 05/23/2021 11:22    Anti-infectives: Anti-infectives (From admission, onward)    Start     Dose/Rate Route Frequency Ordered Stop   05/21/21 1615  piperacillin-tazobactam (ZOSYN) IVPB 3.375 g        3.375 g 12.5 mL/hr over 240 Minutes Intravenous Every 8 hours 05/21/21 1526 05/26/21 1359   05/20/21 0845  cefoTEtan (CEFOTAN) 2 g in sodium chloride 0.9 % 100 mL IVPB       Note to Pharmacy: Pharmacy to adjust dose as needed   2 g 200 mL/hr over 30 Minutes Intravenous On call to O.R. 05/20/21 0753 05/20/21 1436   05/18/21 1000  ciprofloxacin (CIPRO) IVPB 400 mg  Status:  Discontinued        400 mg 200 mL/hr over 60 Minutes Intravenous Every 12 hours 05/18/21 0556 05/21/21 1519   05/18/21 1000  metroNIDAZOLE (FLAGYL) IVPB 500 mg  Status:  Discontinued        500 mg 100 mL/hr over 60 Minutes Intravenous Every 12 hours 05/18/21 0556 05/21/21 1519   05/17/21 2200  ciprofloxacin (CIPRO) IVPB 400 mg        400 mg 200 mL/hr over 60 Minutes Intravenous  Once 05/17/21 2149 05/17/21 2320   05/17/21 2200  metroNIDAZOLE (FLAGYL) IVPB 500 mg        500 mg 100 mL/hr over 60 Minutes Intravenous  Once 05/17/21 2149 05/17/21 2315       Assessment/Plan: s/p Procedure(s): EXPLORATORY LAPAROTOMY WITH RIGHT HEMI  COLECTOMY; CREATION OF LOOP SIGMOID COLOSTOMY (N/A) Advance diet. Allow  clears D/c ng POD 5 s/p Exploratory laparotomy, right hemicolectomy, loop sigmoid colostomy for Very distal colonic stricture extremely deep in the pelvis and perforated cecum - Dr. Kae Heller, 05/20/2021 - Cont IV abx x 5 days post op -High risk for ileus, having copious stool output and distention improving, will d/c NG tube today and continue sips of clears. - Monitor stoma. Retracted but viable. Cont red rubber-remove early next week - Negative pressure wound therapy M/W/F - WOCN following for new ostomy and vac - Mobilize, PT eval -Home health PT - Pulm toilet   FEN - NPO, NGT  clamp trials.    VTE - SCDs, Lovenox. Cont to hold plavix for now ID - Cipro/Flagyl 2/17-21, Zosyn 2/21 >> WBC up to 14.9 yesterday, afebrile, monitor; high risk for postoperative abscess, suspect she will need a repeat CT scan in the next couple days. Foley -remove Foley today   - Per [facility] - Hypokalemia-replacing IV AKI -resolved E. Coli UTI Hx CAD s/p DES 2021 - hold home plavix HTN HLD OSA Hx CHF   Ultimately once recovered she will need a complete colonoscopy to evaluate the stricture, will need biopsies, given how low this is.  Will need follow-up with colorectal surgeon.  LOS: 8 days    Valerie Fields 05/25/2021

## 2021-05-25 NOTE — Progress Notes (Signed)
[facility] PROGRESS NOTE    Progress Note  Valerie Fields  TDS:[id number] DOB: 10/30/1950 DOA: 05/17/2021 PCP: Marda Stalker, PA-C     Brief Narrative:   Valerie Fields is an 71 y.o. female past medical history coronary artery disease with a history of NSTEMI cath in 2021 that showed a single-vessel disease and high-grade LAD stenosis status post drug-eluting stents, combined systolic and diastolic heart failure with an EF of 60% by 2D echo on 04/24/2021, obstructive sleep apnea presents to the ER from the GI office due to multiple episodes of abdominal pain and bloating there was a concern for acute diverticulitis flare patient started having symptoms about 10 days prior to admission started on Augmentin for UTI saw GI in February 2016 for abdominal pain started on demeclocycline in the ED was seen by GI and general surgery had a flex sig, underwent Perative management in 05/20/2021 with a right colectomy and loop sigmoid colostomy this had to be doing openly due to distal colonic stricture in the pelvis and had a perforated cecum for which she developed feculent peritonitis at the time of surgery.She was started on IV antibiotics Cipro and Flagyl on 05/17/2021, transition to IV Zosyn on 05/21/2021 as she is at high risk of developing abscess.   Assessment/Plan:   High-grade benign distal sigmoid stricture with perforation probable diverticular stricture of the sigmoid/Status post exploratory laparotomy with right hemicolectomy, loop sigmoid colostomy and a perforated cecum on 05/20/2021: Continue to hold Plavix, surgery to dictate when to restart. Tmax of 98.6, leukocytosis improved. Continue IV Zosyn. At risk for ileus. Physical therapy evaluated the patient recommended home health PT.   Potassium is low this morning continue IV fluid check urine rate we will replete potassium IV recheck in the morning.  Acute kidney injury: Likely prerenal azotemia, in the setting of  ACE inhibitor. She is positive about 4 L continues to have good urine output. Continue to hold diuretic therapy she has been weaned to room air. Her creatinine has returned to baseline.  Leukocytosis: Likely due to infectious etiology, resolved,  Chronic combined systolic and diastolic heart failure: Last 2D echo in 2021 showed an EF of 60% with grade 2 diastolic heart failure. She was stabilized she is becoming slightly tachycardic. Continue beta-blockers keep fluids KVO.  History of coronary artery disease with a DES stent to the LAD: Holding Plavix surgery to dictate when to restart.  Essential hypertension: Continue to hold losartan blood pressure stable tachycardia improved with IV metoprolol.  Hypokalemia: Repleted IV, basic metabolic panels pending this morning.  Morbid obesity: Counseling  DVT prophylaxis: scd's Family Communication:sister Status is: Inpatient Remains inpatient appropriate because: Perforated colon     Code Status:     Code Status Orders  (From admission, onward)           Start     Ordered   05/18/21 0729  Full code  Continuous        05/18/21 0729           Code Status History     Date Active Date Inactive Code Status Order ID Comments User Context   05/17/2021 2255 05/18/2021 0729 Full Code [id number]  Kristopher Oppenheim, DO ED   10/13/2019 1348 10/15/2019 1603 Full Code [id number]  Nelva Bush, MD Inpatient      Advance Directive Documentation    Flowsheet Row Most Recent Value  Type of Advance Directive Healthcare Power of Attorney, Living will  Pre-existing out of facility DNR order (yellow  form or pink MOST form) --  "MOST" Form in Place? --         IV Access:   Peripheral IV   Procedures and diagnostic studies:   DG CHEST PORT 1 VIEW  Result Date: 05/23/2021 CLINICAL DATA:  Dyspnea R06.00 (ICD-10-CM) EXAM: PORTABLE CHEST 1 VIEW COMPARISON:  10/13/2019. FINDINGS: Low lung volumes with mild bibasilar opacities. No  visible pleural effusions or pneumothorax. Cardiac silhouette is accentuated by low lung volumes and AP technique. Gastric tube courses below the diaphragm with the tip outside the field of view. IMPRESSION: Low lung volumes with mild bibasilar opacities, which could represent atelectasis, aspiration, and/or pneumonia. Dedicated PA and lateral radiographs could further characterize if clinically indicated. Electronically Signed   By: Margaretha Sheffield M.D.   On: 05/23/2021 11:22     Medical Consultants:   None.   Subjective:    Valerie Fields eager to have her NG tube removed it was clamped yesterday Objective:    Vitals:   05/24/21 2043 05/24/21 2359 05/25/21 0502 05/25/21 0700  BP: 138/69 120/62 (!) 147/73 (!) 144/65  Pulse: 96 80 94 88  Resp: 18 18 18 18   Temp: 99.1 F (37.3 C)  98.7 F (37.1 C) 98.6 F (37 C)  TempSrc: Oral  Oral Oral  SpO2: 91% 95% 96% 95%  Weight:   100.4 kg   Height:       SpO2: 95 % O2 Flow Rate (L/min): 2 L/min   Intake/Output Summary (Last 24 hours) at 05/25/2021 0937 Last data filed at 05/25/2021 0600 Gross per 24 hour  Intake 1407.05 ml  Output 1570 ml  Net -162.95 ml    Filed Weights   05/22/21 0500 05/23/21 0500 05/25/21 0502  Weight: 103.6 kg 105.3 kg 100.4 kg    Exam: General exam: In no acute distress. Respiratory system: Good air movement and clear to auscultation. Cardiovascular system: S1 & S2 heard, RRR. No JVD. Gastrointestinal system: Abdomen is is nondistended nontender with wound VAC in place. Extremities: No pedal edema. Skin: No rashes, lesions or ulcers Psychiatry: Judgement and insight appear normal. Mood & affect appropriate.   Data Reviewed:    Labs: Basic Metabolic Panel: Recent Labs  Lab 05/21/21 0437 05/21/21 1622 05/22/21 0430 05/23/21 0429 05/24/21 0430  NA 134* 134* 135 133* 137  K 5.7* 4.5 4.4 3.6 3.3*  CL 107 107 109 108 110  CO2 18* 20* 19* 20* 22  GLUCOSE 189* 142* 150* 114* 85  BUN  10 16 19 20 19   CREATININE 1.89* 2.04* 1.75* 1.23* 0.89  CALCIUM 8.5* 8.3* 8.1* 8.2* 8.6*  MG 1.9 1.7 1.8 2.4 2.3    GFR Estimated Creatinine Clearance: 66.1 mL/min (by C-G formula based on SCr of 0.89 mg/dL). Liver Function Tests: Recent Labs  Lab 05/19/21 0436 05/20/21 0410 05/21/21 0437 05/21/21 1622 05/22/21 0430  AST 17 16 28 30 28   ALT 17 16 16 15 16   ALKPHOS 49 48 34* 32* 34*  BILITOT 0.6 0.7 0.8 0.3 0.6  PROT 6.0* 6.0* 4.6* 4.5* 4.3*  ALBUMIN 3.1* 3.0* 2.1* 2.0* 1.9*    No results for input(s): LIPASE, AMYLASE in the last 168 hours.  No results for input(s): AMMONIA in the last 168 hours. Coagulation profile No results for input(s): INR, PROTIME in the last 168 hours. COVID-19 Labs  No results for input(s): DDIMER, FERRITIN, LDH, CRP in the last 72 hours.  Lab Results  Component Value Date   [place] NEGATIVE 05/17/2021   SARSCOV2NAA  Not Detected 12/09/2019   [place] NEGATIVE 10/13/2019    CBC: Recent Labs  Lab 05/20/21 0410 05/21/21 0437 05/22/21 0430 05/24/21 0430  WBC 12.1* 11.5* 14.9* 11.7*  NEUTROABS 9.3* 9.5*  --   --   HGB 12.5 15.9* 12.9 12.7  HCT 36.7 48.8* 39.8 38.4  MCV 94.6 99.8 99.3 96.5  PLT 298 345 312 293    Cardiac Enzymes: No results for input(s): CKTOTAL, CKMB, CKMBINDEX, TROPONINI in the last 168 hours. BNP (last 3 results) No results for input(s): PROBNP in the last 8760 hours. CBG: No results for input(s): GLUCAP in the last 168 hours. D-Dimer: No results for input(s): DDIMER in the last 72 hours. Hgb A1c: No results for input(s): HGBA1C in the last 72 hours. Lipid Profile: No results for input(s): CHOL, HDL, LDLCALC, TRIG, CHOLHDL, LDLDIRECT in the last 72 hours. Thyroid function studies: No results for input(s): TSH, T4TOTAL, T3FREE, THYROIDAB in the last 72 hours.  Invalid input(s): FREET3 Anemia work up: No results for input(s): VITAMINB12, FOLATE, FERRITIN, TIBC, IRON, RETICCTPCT in the last 72  hours. Sepsis Labs: Recent Labs  Lab 05/20/21 0410 05/21/21 0437 05/22/21 0430 05/24/21 0430  WBC 12.1* 11.5* 14.9* 11.7*    Microbiology Recent Results (from the past 240 hour(s))  Resp Panel by RT-PCR (Flu A&B, Covid) Urine, Clean Catch     Status: None   Collection Time: 05/17/21  4:32 PM   Specimen: Urine, Clean Catch; Nasopharyngeal(NP) swabs in vial transport medium  Result Value Ref Range Status   SARS Coronavirus 2 by RT PCR NEGATIVE NEGATIVE Final    Comment: (NOTE) SARS-CoV-2 target nucleic acids are NOT DETECTED.  The SARS-CoV-2 RNA is generally detectable in upper respiratory specimens during the acute phase of infection. The lowest concentration of SARS-CoV-2 viral copies this assay can detect is 138 copies/mL. A negative result does not preclude SARS-Cov-2 infection and should not be used as the sole basis for treatment or other patient management decisions. A negative result may occur with  improper specimen collection/handling, submission of specimen other than nasopharyngeal swab, presence of viral mutation(s) within the areas targeted by this assay, and inadequate number of viral copies(<138 copies/mL). A negative result must be combined with clinical observations, patient history, and epidemiological information. The expected result is Negative.  Fact Sheet for Patients:  EntrepreneurPulse.com.au  Fact Sheet for Healthcare Providers:  IncredibleEmployment.be  This test is no t yet approved or cleared by the [place] FDA and  has been authorized for detection and/or diagnosis of SARS-CoV-2 by FDA under an Emergency Use Authorization (EUA). This EUA will remain  in effect (meaning this test can be used) for the duration of the COVID-19 declaration under Section 564(b)(1) of the Act, 21 U.S.C.section 360bbb-3(b)(1), unless the authorization is terminated  or revoked sooner.       Influenza A by PCR NEGATIVE  NEGATIVE Final   Influenza B by PCR NEGATIVE NEGATIVE Final    Comment: (NOTE) The Xpert Xpress SARS-CoV-2/FLU/RSV plus assay is intended as an aid in the diagnosis of influenza from Nasopharyngeal swab specimens and should not be used as a sole basis for treatment. Nasal washings and aspirates are unacceptable for Xpert Xpress SARS-CoV-2/FLU/RSV testing.  Fact Sheet for Patients: EntrepreneurPulse.com.au  Fact Sheet for Healthcare Providers: IncredibleEmployment.be  This test is not yet approved or cleared by the [place] FDA and has been authorized for detection and/or diagnosis of SARS-CoV-2 by FDA under an Emergency Use Authorization (EUA). This EUA will remain in effect (  meaning this test can be used) for the duration of the COVID-19 declaration under Section 564(b)(1) of the Act, 21 U.S.C. section 360bbb-3(b)(1), unless the authorization is terminated or revoked.  Performed at [facility], [place] [address], [place], [place] [address]   Urine Culture     Status: Abnormal   Collection Time: 05/17/21 10:27 PM   Specimen: Urine, Clean Catch  Result Value Ref Range Status   Specimen Description   Final    URINE, CLEAN CATCH Performed at [facility], [place] [address] [place], [place] [address]    Special Requests   Final    NONE Performed at [facility], [place] [address], [place], [place] [address]    Culture >=100,000 COLONIES/mL ESCHERICHIA COLI (A)  Final   Report Status 05/20/2021 FINAL  Final   Organism ID, Bacteria ESCHERICHIA COLI (A)  Final      Susceptibility   Escherichia coli - MIC*    AMPICILLIN 8 SENSITIVE Sensitive     CEFAZOLIN <=4 SENSITIVE Sensitive     CEFEPIME <=0.12 SENSITIVE Sensitive     CEFTRIAXONE <=0.25 SENSITIVE Sensitive     CIPROFLOXACIN <=0.25 SENSITIVE Sensitive     GENTAMICIN <=1 SENSITIVE Sensitive     IMIPENEM <=0.25 SENSITIVE Sensitive      NITROFURANTOIN <=16 SENSITIVE Sensitive     TRIMETH/SULFA <=20 SENSITIVE Sensitive     AMPICILLIN/SULBACTAM 4 SENSITIVE Sensitive     PIP/TAZO <=4 SENSITIVE Sensitive     * >=100,000 COLONIES/mL ESCHERICHIA COLI  C Difficile Quick Screen w PCR reflex     Status: None   Collection Time: 05/18/21 10:00 AM  Result Value Ref Range Status   C Diff antigen NEGATIVE NEGATIVE Final   C Diff toxin NEGATIVE NEGATIVE Final   C Diff interpretation No C. difficile detected.  Final    Comment: Performed at [facility], [place] [address], [place], [place] [address]     Medications:    acetaminophen  650 mg Oral Q6H   atorvastatin  80 mg Oral Daily   Chlorhexidine Gluconate Cloth  6 each Topical Daily   enoxaparin (LOVENOX) injection  40 mg Subcutaneous Q24H   metoprolol tartrate  2.5 mg Intravenous Q6H   montelukast  10 mg Oral QHS   pantoprazole  40 mg Oral BID   Continuous Infusions:  dextrose 5 % with kcl 50 mL/hr at 05/24/21 1003   lactated ringers 10 mL/hr at 05/20/21 1226   methocarbamol (ROBAXIN) IV     piperacillin-tazobactam (ZOSYN)  IV 3.375 g (05/25/21 0504)      LOS: 8 days   Charlynne Cousins  [facility]  05/25/2021, 9:37 AM

## 2021-05-26 DIAGNOSIS — I5042 Chronic combined systolic (congestive) and diastolic (congestive) heart failure: Secondary | ICD-10-CM | POA: Diagnosis not present

## 2021-05-26 DIAGNOSIS — E876 Hypokalemia: Secondary | ICD-10-CM | POA: Diagnosis not present

## 2021-05-26 DIAGNOSIS — K56609 Unspecified intestinal obstruction, unspecified as to partial versus complete obstruction: Secondary | ICD-10-CM | POA: Diagnosis not present

## 2021-05-26 DIAGNOSIS — K3532 Acute appendicitis with perforation and localized peritonitis, without abscess: Secondary | ICD-10-CM | POA: Diagnosis not present

## 2021-05-26 LAB — BASIC METABOLIC PANEL
Anion gap: 7 (ref 5–15)
BUN: 9 mg/dL (ref 8–23)
CO2: 22 mmol/L (ref 22–32)
Calcium: 8.4 mg/dL — ABNORMAL LOW (ref 8.9–10.3)
Chloride: 104 mmol/L (ref 98–111)
Creatinine, Ser: 0.65 mg/dL (ref 0.44–1.00)
GFR, Estimated: >60 mL/min (ref >60)
Glucose, Bld: 91 mg/dL (ref 70–99)
Potassium: 3.1 mmol/L — ABNORMAL LOW (ref 3.5–5.1)
Sodium: 133 mmol/L — ABNORMAL LOW (ref 135–145)

## 2021-05-26 MED ORDER — POTASSIUM CHLORIDE 10 MEQ/100ML IV SOLN
10.0000 meq | INTRAVENOUS | Status: DC
  Administered 2021-05-26 (×5): 10 meq via INTRAVENOUS
  Filled 2021-05-26 (×5): qty 100

## 2021-05-26 MED ORDER — POTASSIUM CHLORIDE 20 MEQ PO PACK
40.0000 meq | PACK | Freq: Once | ORAL | Status: AC
Start: 2021-05-26 — End: 2021-05-26
  Administered 2021-05-26: 40 meq via ORAL
  Filled 2021-05-26: qty 2

## 2021-05-26 MED ORDER — POTASSIUM CHLORIDE 2 MEQ/ML IV SOLN
INTRAVENOUS | Status: DC
  Filled 2021-05-26 (×2): qty 1000

## 2021-05-26 MED ORDER — POTASSIUM CHLORIDE 20 MEQ PO PACK: 40.0000 meq | PACK | Freq: Two times a day (BID) | ORAL | Status: DC

## 2021-05-26 NOTE — Progress Notes (Signed)
6 Days Post-Op   Subjective/Chief Complaint: Feels better. Hungry and wants food   Objective: Vital signs in last 24 hours: Temp:  [98.3 F (36.8 C)-99.3 F (37.4 C)] 98.3 F (36.8 C) (02/26 0518) Pulse Rate:  [86-92] 92 (02/26 0518) Resp:  [16-18] 16 (02/26 0518) BP: (128-152)/(67-75) 152/75 (02/26 0518) SpO2:  [94 %-95 %] 94 % (02/26 0518) Weight:  [100.4 kg] 100.4 kg (02/26 0518) Last BM Date : 05/25/21  Intake/Output from previous day: 02/25 0701 - 02/26 0700 In: 2363.4 [P.O.:815; I.V.:1151.4; IV Piggyback:396.9] Out: 725 [Stool:725] Intake/Output this shift: Total I/O In: -  Out: 400 [Urine:400]  General appearance: alert and cooperative Resp: clear to auscultation bilaterally Cardio: regular rate and rhythm GI: soft, vac in place. Ostomy pink and productive with red rubber in place  Lab Results:  Recent Labs    05/24/21 0430  WBC 11.7*  HGB 12.7  HCT 38.4  PLT 293   BMET Recent Labs    05/25/21 1000 05/26/21 0432  NA 134* 133*  K 3.0* 3.1*  CL 103 104  CO2 23 22  GLUCOSE 119* 91  BUN 12 9  CREATININE 0.58 0.65  CALCIUM 8.5* 8.4*   PT/INR No results for input(s): LABPROT, INR in the last 72 hours. ABG No results for input(s): PHART, HCO3 in the last 72 hours.  Invalid input(s): PCO2, PO2  Studies/Results: No results found.  Anti-infectives: Anti-infectives (From admission, onward)    Start     Dose/Rate Route Frequency Ordered Stop   05/21/21 1615  piperacillin-tazobactam (ZOSYN) IVPB 3.375 g        3.375 g 12.5 mL/hr over 240 Minutes Intravenous Every 8 hours 05/21/21 1526 05/26/21 1359   05/20/21 0845  cefoTEtan (CEFOTAN) 2 g in sodium chloride 0.9 % 100 mL IVPB       Note to Pharmacy: Pharmacy to adjust dose as needed   2 g 200 mL/hr over 30 Minutes Intravenous On call to O.R. 05/20/21 0753 05/20/21 1436   05/18/21 1000  ciprofloxacin (CIPRO) IVPB 400 mg  Status:  Discontinued        400 mg 200 mL/hr over 60 Minutes Intravenous  Every 12 hours 05/18/21 0556 05/21/21 1519   05/18/21 1000  metroNIDAZOLE (FLAGYL) IVPB 500 mg  Status:  Discontinued        500 mg 100 mL/hr over 60 Minutes Intravenous Every 12 hours 05/18/21 0556 05/21/21 1519   05/17/21 2200  ciprofloxacin (CIPRO) IVPB 400 mg        400 mg 200 mL/hr over 60 Minutes Intravenous  Once 05/17/21 2149 05/17/21 2320   05/17/21 2200  metroNIDAZOLE (FLAGYL) IVPB 500 mg        500 mg 100 mL/hr over 60 Minutes Intravenous  Once 05/17/21 2149 05/17/21 2315       Assessment/Plan: s/p Procedure(s): EXPLORATORY LAPAROTOMY WITH RIGHT HEMI  COLECTOMY; CREATION OF LOOP SIGMOID COLOSTOMY (N/A) Advance diet POD 6 s/p Exploratory laparotomy, right hemicolectomy, loop sigmoid colostomy for Very distal colonic stricture extremely deep in the pelvis and perforated cecum - Dr. Kae Heller, 05/20/2021 - Cont IV abx x 5 days post op -High risk for ileus, having copious stool output and distention improving, will d/c NG tube today and continue sips of clears. - Monitor stoma. Retracted but viable. Cont red rubber-remove early next week - Negative pressure wound therapy M/W/F - WOCN following for new ostomy and vac - Mobilize, PT eval -Home health PT - Pulm toilet   FEN - NPO, NGT clamp  trials.    VTE - SCDs, Lovenox. Cont to hold plavix for now ID - Cipro/Flagyl 2/17-21, Zosyn 2/21 >> WBC up to 14.9 yesterday, afebrile, monitor; high risk for postoperative abscess, suspect she will need a repeat CT scan in the next couple days. Foley -remove Foley today   - Per [facility] - Hypokalemia-replacing IV AKI -resolved E. Coli UTI Hx CAD s/p DES 2021 - hold home plavix HTN HLD OSA Hx CHF   Ultimately once recovered she will need a complete colonoscopy to evaluate the stricture, will need biopsies, given how low this is.  Will need follow-up with colorectal surgeon.  LOS: 9 days    Valerie Fields 05/26/2021

## 2021-05-26 NOTE — Progress Notes (Signed)
Occupational Therapy Treatment Patient Details Name: Valerie Fields MRN: [id number] DOB: 1950-05-14 Today's Date: 05/26/2021   History of present illness 71 yo female admitted with colonic obstruction. S/P hemicolectomy, colostomy, wound vac placement 2/22. Hx of NSTEMI, CAD, HF, OSA   OT comments  Treatment focused on education in regards to ADL kit and compensatory strategies at home to reduce pushing/pulling and heavy lifting to protect abdomen. Patient verbalized understanding and demonstrated use of sock aide. Patient reports getting up to [facility] independently - depending on placement of IV pole, wound vac and other lines. Cont POC.   Recommendations for follow up therapy are one component of a multi-disciplinary discharge planning process, led by the attending physician.  Recommendations may be updated based on patient status, additional functional criteria and insurance authorization.    Follow Up Recommendations  Home health OT    Assistance Recommended at Discharge Intermittent Supervision/Assistance  Patient can return home with the following  Assistance with cooking/housework;Help with stairs or ramp for entrance   Equipment Recommendations  None recommended by OT    Recommendations for Other Services      Precautions / Restrictions Precautions Precautions: Fall Precaution Comments: L colostomy, Wound Vac          Balance Overall balance assessment: Mild deficits observed, not formally tested                                         ADL either performed or assessed with clinical judgement   ADL Overall ADL's : Needs assistance/impaired                     Lower Body Dressing: Supervision/safety Lower Body Dressing Details (indicate cue type and reason): TO don sock using sock aide after instruction.             Functional mobility during ADLs: Supervision/safety General ADL Comments: Supervion for bed mobilty and transfer to  recliner. Reports getting up to [address] independently and no difficulty with wiping/pericare. Patient educated on use of AE for lower body dressing and patient demonstrated use of sock aide. Also discussed compensatory strategies at home to limit lifting and large movements. Patient verbalized understanding of all education. REports she is ordering a shower chair and may order an ADL kit.    Extremity/Trunk Assessment Upper Extremity Assessment Upper Extremity Assessment: Overall WFL for tasks assessed            Vision Patient Visual Report: No change from baseline            Cognition Arousal/Alertness: Awake/alert Behavior During Therapy: WFL for tasks assessed/performed Overall Cognitive Status: Within Functional Limits for tasks assessed                                                     Pertinent Vitals/ Pain       Pain Assessment Pain Assessment: Faces Pain Score: 5  Pain Location: abdomen with activity Pain Descriptors / Indicators: Discomfort, Sore Pain Intervention(s): Limited activity within patient's tolerance   Frequency  Min 2X/week        Progress Toward Goals  OT Goals(current goals can now be found in the care plan section)  Progress towards OT goals: Progressing toward goals  Acute Rehab OT Goals Patient Stated Goal: get back to normal OT Goal Formulation: With patient Time For Goal Achievement: 06/06/21 Potential to Achieve Goals: Good  Plan Discharge plan remains appropriate    Co-evaluation                 AM-PAC OT "6 Clicks" Daily Activity     Outcome Measure   Help from another person eating meals?: None Help from another person taking care of personal grooming?: None Help from another person toileting, which includes using toliet, bedpan, or urinal?: None Help from another person bathing (including washing, rinsing, drying)?: A Little Help from another person to put on and taking off regular upper body  clothing?: None Help from another person to put on and taking off regular lower body clothing?: A Little 6 Click Score: 22    End of Session    OT Visit Diagnosis: Other abnormalities of gait and mobility (R26.89)   Activity Tolerance Patient tolerated treatment well   Patient Left in chair;with call bell/phone within reach;with nursing/sitter in room   Nurse Communication  (okay to see)        Time: 3428-7681 OT Time Calculation (min): 15 min  Charges: OT General Charges $OT Visit: 1 Visit OT Treatments $Self Care/Home Management : 8-22 mins  Derl Barrow, OTR/L [place]  Office [phone] Pager: [phone]   Lenward Chancellor 05/26/2021, 10:45 AM

## 2021-05-26 NOTE — Progress Notes (Signed)
[facility] PROGRESS NOTE    Progress Note  Valerie Fields  HYW:[id number] DOB: Oct 25, 1950 DOA: 05/17/2021 PCP: Marda Stalker, PA-C     Brief Narrative:   Valerie Fields is an 71 y.o. female past medical history coronary artery disease with a history of NSTEMI cath in 2021 that showed a single-vessel disease and high-grade LAD stenosis status post drug-eluting stents, combined systolic and diastolic heart failure with an EF of 60% by 2D echo on 04/24/2021, obstructive sleep apnea presents to the ER from the GI office due to multiple episodes of abdominal pain and bloating there was a concern for acute diverticulitis flare patient started having symptoms about 10 days prior to admission started on Augmentin for UTI saw GI in February 2016 for abdominal pain started on demeclocycline in the ED was seen by GI and general surgery had a flex sig, underwent Perative management in 05/20/2021 with a right colectomy and loop sigmoid colostomy this had to be doing openly due to distal colonic stricture in the pelvis and had a perforated cecum for which she developed feculent peritonitis at the time of surgery.She was started on IV antibiotics Cipro and Flagyl on 05/17/2021, transition to IV Zosyn on 05/21/2021 as she is at high risk of developing abscess.   Assessment/Plan:   High-grade benign distal sigmoid stricture with perforation probable diverticular stricture of the sigmoid/Status post exploratory laparotomy with right hemicolectomy, loop sigmoid colostomy and a perforated cecum on 05/20/2021: Continue to hold Plavix, surgery to dictate when to restart. Tmax of 98.8, her leukocytosis on CBC continues to improve. Continue IV Zosyn, it seems like her ileus has resolved she is passing gas.  In her colostomy bag is full. Continue to work with physical therapy. Her potassium continues to be low, NG tube has been discontinued she has tolerated ice chips. We will continue IV fluids with  potassium supplements. She is currently NPO. Her mag is 2.3, try to keep potassium greater than 4 continue IV supplementation.  Acute kidney injury: Likely prerenal azotemia, in the setting of ACE inhibitor. Positive about 5-1/2 L continues to have good urine output. Her creatinine has returned to baseline.  Leukocytosis: Likely due to infectious etiology, resolved.  Chronic combined systolic and diastolic heart failure: Last 2D echo in 2021 showed an EF of 60% with grade 2 diastolic heart failure. She was stabilized she is becoming slightly tachycardic. Continue beta-blockers, resume IV fluids she is currently NPO.  History of coronary artery disease with a DES stent to the LAD: Holding Plavix surgery to dictate when to restart.  Essential hypertension: Continue to hold losartan blood pressure stable tachycardia improved with IV metoprolol.  Hypokalemia: Continues to be low, continue IV potassium runs recheck basic metabolic panel in the morning. Magnesium is 2.3.  Morbid obesity: Counseling  DVT prophylaxis: scd's Family Communication:sister Status is: Inpatient Remains inpatient appropriate because: Perforated colon     Code Status:     Code Status Orders  (From admission, onward)           Start     Ordered   05/18/21 0729  Full code  Continuous        05/18/21 0729           Code Status History     Date Active Date Inactive Code Status Order ID Comments User Context   05/17/2021 2255 05/18/2021 0729 Full Code [id number]  Kristopher Oppenheim, DO ED   10/13/2019 1348 10/15/2019 1603 Full Code [id number]  Nelva Bush, MD  Inpatient      Advance Directive Documentation    Flowsheet Row Most Recent Value  Type of Advance Directive Healthcare Power of Attorney, Living will  Pre-existing out of facility DNR order (yellow form or pink MOST form) --  "MOST" Form in Place? --         IV Access:   Peripheral IV   Procedures and diagnostic studies:    No results found.   Medical Consultants:   None.   Subjective:    [name] in a good mood that her NG tube was discontinued. Objective:    Vitals:   05/25/21 0700 05/25/21 1300 05/25/21 2049 05/26/21 0518  BP: (!) 144/65 128/67 139/74 (!) 152/75  Pulse: 88 86 92 92  Resp: 18 18 17 16   Temp: 98.6 F (37 C) 98.8 F (37.1 C) 99.3 F (37.4 C) 98.3 F (36.8 C)  TempSrc: Oral Oral Oral Oral  SpO2: 95%  95% 94%  Weight:    100.4 kg  Height:       SpO2: 94 % O2 Flow Rate (L/min): 2 L/min   Intake/Output Summary (Last 24 hours) at 05/26/2021 0817 Last data filed at 05/26/2021 2229 Gross per 24 hour  Intake 2363.37 ml  Output 725 ml  Net 1638.37 ml    Filed Weights   05/23/21 0500 05/25/21 0502 05/26/21 0518  Weight: 105.3 kg 100.4 kg 100.4 kg    Exam: General exam: In no acute distress. Respiratory system: Good air movement and clear to auscultation. Cardiovascular system: S1 & S2 heard, RRR. No JVD. Psychiatry: Judgement and insight appear normal. Mood & affect appropriate. Gastrointestinal system: Abdomen is is nondistended nontender with wound VAC in place. Extremities: No pedal edema. Skin: No rashes, lesions or ulcers Psychiatry: Judgement and insight appear normal. Mood & affect appropriate.   Data Reviewed:    Labs: Basic Metabolic Panel: Recent Labs  Lab 05/21/21 0437 05/21/21 1622 05/22/21 0430 05/23/21 0429 05/24/21 0430 05/25/21 1000 05/26/21 0432  NA 134* 134* 135 133* 137 134* 133*  K 5.7* 4.5 4.4 3.6 3.3* 3.0* 3.1*  CL 107 107 109 108 110 103 104  CO2 18* 20* 19* 20* 22 23 22   GLUCOSE 189* 142* 150* 114* 85 119* 91  BUN 10 16 19 20 19 12 9   CREATININE 1.89* 2.04* 1.75* 1.23* 0.89 0.58 0.65  CALCIUM 8.5* 8.3* 8.1* 8.2* 8.6* 8.5* 8.4*  MG 1.9 1.7 1.8 2.4 2.3  --   --     GFR Estimated Creatinine Clearance: 73.6 mL/min (by C-G formula based on SCr of 0.65 mg/dL). Liver Function Tests: Recent Labs  Lab  05/20/21 0410 05/21/21 0437 05/21/21 1622 05/22/21 0430  AST 16 28 30 28   ALT 16 16 15 16   ALKPHOS 48 34* 32* 34*  BILITOT 0.7 0.8 0.3 0.6  PROT 6.0* 4.6* 4.5* 4.3*  ALBUMIN 3.0* 2.1* 2.0* 1.9*    No results for input(s): LIPASE, AMYLASE in the last 168 hours.  No results for input(s): AMMONIA in the last 168 hours. Coagulation profile No results for input(s): INR, PROTIME in the last 168 hours. COVID-19 Labs  No results for input(s): DDIMER, FERRITIN, LDH, CRP in the last 72 hours.  Lab Results  Component Value Date   SARSCOV2NAA NEGATIVE 05/17/2021   [place] Not Detected 12/09/2019   [place] NEGATIVE 10/13/2019    CBC: Recent Labs  Lab 05/20/21 0410 05/21/21 0437 05/22/21 0430 05/24/21 0430  WBC 12.1* 11.5* 14.9* 11.7*  NEUTROABS 9.3* 9.5*  --   --  HGB 12.5 15.9* 12.9 12.7  HCT 36.7 48.8* 39.8 38.4  MCV 94.6 99.8 99.3 96.5  PLT 298 345 312 293    Cardiac Enzymes: No results for input(s): CKTOTAL, CKMB, CKMBINDEX, TROPONINI in the last 168 hours. BNP (last 3 results) No results for input(s): PROBNP in the last 8760 hours. CBG: No results for input(s): GLUCAP in the last 168 hours. D-Dimer: No results for input(s): DDIMER in the last 72 hours. Hgb A1c: No results for input(s): HGBA1C in the last 72 hours. Lipid Profile: No results for input(s): CHOL, HDL, LDLCALC, TRIG, CHOLHDL, LDLDIRECT in the last 72 hours. Thyroid function studies: No results for input(s): TSH, T4TOTAL, T3FREE, THYROIDAB in the last 72 hours.  Invalid input(s): FREET3 Anemia work up: No results for input(s): VITAMINB12, FOLATE, FERRITIN, TIBC, IRON, RETICCTPCT in the last 72 hours. Sepsis Labs: Recent Labs  Lab 05/20/21 0410 05/21/21 0437 05/22/21 0430 05/24/21 0430  WBC 12.1* 11.5* 14.9* 11.7*    Microbiology Recent Results (from the past 240 hour(s))  Resp Panel by RT-PCR (Flu A&B, Covid) Urine, Clean Catch     Status: None   Collection Time: 05/17/21  4:32 PM    Specimen: Urine, Clean Catch; Nasopharyngeal(NP) swabs in vial transport medium  Result Value Ref Range Status   SARS Coronavirus 2 by RT PCR NEGATIVE NEGATIVE Final    Comment: (NOTE) SARS-CoV-2 target nucleic acids are NOT DETECTED.  The SARS-CoV-2 RNA is generally detectable in upper respiratory specimens during the acute phase of infection. The lowest concentration of SARS-CoV-2 viral copies this assay can detect is 138 copies/mL. A negative result does not preclude SARS-Cov-2 infection and should not be used as the sole basis for treatment or other patient management decisions. A negative result may occur with  improper specimen collection/handling, submission of specimen other than nasopharyngeal swab, presence of viral mutation(s) within the areas targeted by this assay, and inadequate number of viral copies(<138 copies/mL). A negative result must be combined with clinical observations, patient history, and epidemiological information. The expected result is Negative.  Fact Sheet for Patients:  EntrepreneurPulse.com.au  Fact Sheet for Healthcare Providers:  IncredibleEmployment.be  This test is no t yet approved or cleared by the [place] FDA and  has been authorized for detection and/or diagnosis of SARS-CoV-2 by FDA under an Emergency Use Authorization (EUA). This EUA will remain  in effect (meaning this test can be used) for the duration of the COVID-19 declaration under Section 564(b)(1) of the Act, 21 U.S.C.section 360bbb-3(b)(1), unless the authorization is terminated  or revoked sooner.       Influenza A by PCR NEGATIVE NEGATIVE Final   Influenza B by PCR NEGATIVE NEGATIVE Final    Comment: (NOTE) The Xpert Xpress SARS-CoV-2/FLU/RSV plus assay is intended as an aid in the diagnosis of influenza from Nasopharyngeal swab specimens and should not be used as a sole basis for treatment. Nasal washings and aspirates are  unacceptable for Xpert Xpress SARS-CoV-2/FLU/RSV testing.  Fact Sheet for Patients: EntrepreneurPulse.com.au  Fact Sheet for Healthcare Providers: IncredibleEmployment.be  This test is not yet approved or cleared by the [place] FDA and has been authorized for detection and/or diagnosis of SARS-CoV-2 by FDA under an Emergency Use Authorization (EUA). This EUA will remain in effect (meaning this test can be used) for the duration of the COVID-19 declaration under Section 564(b)(1) of the Act, 21 U.S.C. section 360bbb-3(b)(1), unless the authorization is terminated or revoked.  Performed at [facility], [address]  [place], [place] [address]   Urine Culture     Status: Abnormal   Collection Time: 05/17/21 10:27 PM   Specimen: Urine, Clean Catch  Result Value Ref Range Status   Specimen Description   Final    URINE, CLEAN CATCH Performed at [facility], [place] [address], [place], [place] [address]    Special Requests   Final    NONE Performed at [facility], [place] [address], [place], [place] [address]    Culture >=100,000 COLONIES/mL ESCHERICHIA COLI (A)  Final   Report Status 05/20/2021 FINAL  Final   Organism ID, Bacteria ESCHERICHIA COLI (A)  Final      Susceptibility   Escherichia coli - MIC*    AMPICILLIN 8 SENSITIVE Sensitive     CEFAZOLIN <=4 SENSITIVE Sensitive     CEFEPIME <=0.12 SENSITIVE Sensitive     CEFTRIAXONE <=0.25 SENSITIVE Sensitive     CIPROFLOXACIN <=0.25 SENSITIVE Sensitive     GENTAMICIN <=1 SENSITIVE Sensitive     IMIPENEM <=0.25 SENSITIVE Sensitive     NITROFURANTOIN <=16 SENSITIVE Sensitive     TRIMETH/SULFA <=20 SENSITIVE Sensitive     AMPICILLIN/SULBACTAM 4 SENSITIVE Sensitive     PIP/TAZO <=4 SENSITIVE Sensitive     * >=100,000 COLONIES/mL ESCHERICHIA COLI  C Difficile Quick Screen w PCR reflex     Status: None   Collection Time: 05/18/21  10:00 AM  Result Value Ref Range Status   C Diff antigen NEGATIVE NEGATIVE Final   C Diff toxin NEGATIVE NEGATIVE Final   C Diff interpretation No C. difficile detected.  Final    Comment: Performed at [facility], [place] [address], [place], [place] [address]     Medications:    acetaminophen  650 mg Oral Q6H   atorvastatin  80 mg Oral Daily   Chlorhexidine Gluconate Cloth  6 each Topical Daily   enoxaparin (LOVENOX) injection  40 mg Subcutaneous Q24H   metoprolol tartrate  2.5 mg Intravenous Q6H   montelukast  10 mg Oral QHS   pantoprazole  40 mg Oral BID   Continuous Infusions:  dextrose 5 % with kcl 50 mL/hr at 05/25/21 1740   lactated ringers 10 mL/hr at 05/20/21 1226   methocarbamol (ROBAXIN) IV     piperacillin-tazobactam (ZOSYN)  IV 3.375 g (05/26/21 0620)   potassium chloride        LOS: 9 days   Charlynne Cousins  [facility]  05/26/2021, 8:17 AM

## 2021-05-27 DIAGNOSIS — K529 Noninfective gastroenteritis and colitis, unspecified: Secondary | ICD-10-CM

## 2021-05-27 DIAGNOSIS — I5042 Chronic combined systolic (congestive) and diastolic (congestive) heart failure: Secondary | ICD-10-CM | POA: Diagnosis not present

## 2021-05-27 DIAGNOSIS — E785 Hyperlipidemia, unspecified: Secondary | ICD-10-CM | POA: Diagnosis not present

## 2021-05-27 DIAGNOSIS — K56609 Unspecified intestinal obstruction, unspecified as to partial versus complete obstruction: Secondary | ICD-10-CM | POA: Diagnosis not present

## 2021-05-27 LAB — BASIC METABOLIC PANEL
Anion gap: 4 — ABNORMAL LOW (ref 5–15)
BUN: 7 mg/dL — ABNORMAL LOW (ref 8–23)
CO2: 23 mmol/L (ref 22–32)
Calcium: 8.3 mg/dL — ABNORMAL LOW (ref 8.9–10.3)
Chloride: 107 mmol/L (ref 98–111)
Creatinine, Ser: 0.64 mg/dL (ref 0.44–1.00)
GFR, Estimated: >60 mL/min (ref >60)
Glucose, Bld: 119 mg/dL — ABNORMAL HIGH (ref 70–99)
Potassium: 3.6 mmol/L (ref 3.5–5.1)
Sodium: 134 mmol/L — ABNORMAL LOW (ref 135–145)

## 2021-05-27 MED ORDER — SODIUM CHLORIDE 0.9 % IV SOLN: INTRAVENOUS | Status: AC

## 2021-05-27 MED ORDER — POTASSIUM CHLORIDE CRYS ER 20 MEQ PO TBCR
40.0000 meq | EXTENDED_RELEASE_TABLET | Freq: Two times a day (BID) | ORAL | Status: AC
Start: 2021-05-27 — End: 2021-05-27
  Administered 2021-05-27 (×2): 40 meq via ORAL
  Filled 2021-05-27 (×2): qty 2

## 2021-05-27 MED ORDER — HYDROMORPHONE HCL 1 MG/ML IJ SOLN: 0.5000 mg | INTRAMUSCULAR | Status: DC | PRN

## 2021-05-27 NOTE — Progress Notes (Signed)
7 Days Post-Op  Subjective: CC: Doing well.  Wants solid food.  Ostomy working well. Wants to go to SNF as the friend that was supposed to help her at home is now in the hospital herself and getting surgery today.  Objective: Vital signs in last 24 hours: Temp:  [98.7 F (37.1 C)-99.4 F (37.4 C)] 98.7 F (37.1 C) (02/27 0542) Pulse Rate:  [84-96] 96 (02/27 0542) Resp:  [18] 18 (02/27 0542) BP: (125-139)/(65-72) 132/72 (02/27 0542) SpO2:  [94 %-95 %] 94 % (02/27 0542) Weight:  [100.4 kg] 100.4 kg (02/27 0500) Last BM Date : 05/26/21  Intake/Output from previous day: 02/26 0701 - 02/27 0700 In: 2917.5 [P.O.:880; I.V.:1537.5; IV Piggyback:500] Out: 1150 [Stool:1150] Intake/Output this shift: No intake/output data recorded.  PE: Gen:  Alert, NAD, pleasant Abd: Obese but soft, Appropriately tender.  Midline wound is clean with beefy red granulation tissue.  Ostomy is retract, red rubber removed.  Patent and working well      Lab Results:  No results for input(s): WBC, HGB, HCT, PLT in the last 72 hours.  BMET Recent Labs    05/26/21 0432 05/27/21 0410  NA 133* 134*  K 3.1* 3.6  CL 104 107  CO2 22 23  GLUCOSE 91 119*  BUN 9 7*  CREATININE 0.65 0.64  CALCIUM 8.4* 8.3*   PT/INR No results for input(s): LABPROT, INR in the last 72 hours. CMP     Component Value Date/Time   NA 134 (L) 05/27/2021 0410   NA 141 10/10/2020 0949   K 3.6 05/27/2021 0410   CL 107 05/27/2021 0410   CO2 23 05/27/2021 0410   GLUCOSE 119 (H) 05/27/2021 0410   BUN 7 (L) 05/27/2021 0410   BUN 13 10/10/2020 0949   CREATININE 0.64 05/27/2021 0410   CALCIUM 8.3 (L) 05/27/2021 0410   PROT 4.3 (L) 05/22/2021 0430   PROT 6.8 10/10/2020 0949   ALBUMIN 1.9 (L) 05/22/2021 0430   ALBUMIN 4.4 10/10/2020 0949   AST 28 05/22/2021 0430   ALT 16 05/22/2021 0430   ALKPHOS 34 (L) 05/22/2021 0430   BILITOT 0.6 05/22/2021 0430   BILITOT 0.6 10/10/2020 0949   GFRNONAA >60 05/27/2021 0410    GFRAA 86 01/30/2020 1050   Lipase     Component Value Date/Time   LIPASE 31 05/17/2021 1632    Studies/Results: No results found.  Anti-infectives: Anti-infectives (From admission, onward)    Start     Dose/Rate Route Frequency Ordered Stop   05/21/21 1615  piperacillin-tazobactam (ZOSYN) IVPB 3.375 g        3.375 g 12.5 mL/hr over 240 Minutes Intravenous Every 8 hours 05/21/21 1526 05/26/21 1020   05/20/21 0845  cefoTEtan (CEFOTAN) 2 g in sodium chloride 0.9 % 100 mL IVPB       Note to Pharmacy: Pharmacy to adjust dose as needed   2 g 200 mL/hr over 30 Minutes Intravenous On call to O.R. 05/20/21 0753 05/20/21 1436   05/18/21 1000  ciprofloxacin (CIPRO) IVPB 400 mg  Status:  Discontinued        400 mg 200 mL/hr over 60 Minutes Intravenous Every 12 hours 05/18/21 0556 05/21/21 1519   05/18/21 1000  metroNIDAZOLE (FLAGYL) IVPB 500 mg  Status:  Discontinued        500 mg 100 mL/hr over 60 Minutes Intravenous Every 12 hours 05/18/21 0556 05/21/21 1519   05/17/21 2200  ciprofloxacin (CIPRO) IVPB 400 mg  400 mg 200 mL/hr over 60 Minutes Intravenous  Once 05/17/21 2149 05/17/21 2320   05/17/21 2200  metroNIDAZOLE (FLAGYL) IVPB 500 mg        500 mg 100 mL/hr over 60 Minutes Intravenous  Once 05/17/21 2149 05/17/21 2315        Assessment/Plan POD 7 s/p Exploratory laparotomy, right hemicolectomy, loop sigmoid colostomy for Very distal colonic stricture extremely deep in the pelvis and perforated cecum - Dr. Kae Heller, 05/20/2021 - Cont IV abx x 5 days post op, these have been stopped. - adv to soft diet - Monitor stoma. Retracted but viable. Red rubber removed today - Negative pressure wound therapy M/W/F - WOCN following for new ostomy and vac - Mobilize, Pulm toilet -patient would like SNF for wound care as she lives by herself and her friend that was going to help is now unable to  FEN - soft diet VTE - SCDs, Lovenox. ID - Cipro/Flagyl 2/17-21, Zosyn 2/21 >>  2/26 Foley -none  - Per TR[facility] - Hyperkalemia -improved AKI - 0.64 E. Coli UTI Hx CAD s/p DES 2021 - can likely resume plavix at this point HTN HLD OSA Hx CHF    LOS: 10 days    Henreitta Cea , MD [facility] Surgery 05/27/2021, 10:25 AM Please see Amion for floor coverage during day hours 7:00am-4:30pm

## 2021-05-27 NOTE — Progress Notes (Signed)
[facility] PROGRESS NOTE    Progress Note  Valerie Fields  SLH:[id number] DOB: Apr 18, 1950 DOA: 05/17/2021 PCP: Marda Stalker, PA-C     Brief Narrative:   Valerie Fields is an 71 y.o. female past medical history coronary artery disease with a history of NSTEMI cath in 2021 that showed a single-vessel disease and high-grade LAD stenosis status post drug-eluting stents, combined systolic and diastolic heart failure with an EF of 60% by 2D echo on 04/24/2021, obstructive sleep apnea presents to the ER from the GI office due to multiple episodes of abdominal pain and bloating there was a concern for acute diverticulitis flare patient started having symptoms about 10 days prior to admission started on Augmentin for UTI saw GI in February 2016 for abdominal pain started on demeclocycline in the ED was seen by GI and general surgery had a flex sig, underwent Perative management in 05/20/2021 with a right colectomy and loop sigmoid colostomy this had to be doing openly due to distal colonic stricture in the pelvis and had a perforated cecum for which she developed feculent peritonitis at the time of surgery.She was started on IV antibiotics Cipro and Flagyl on 05/17/2021, transition to IV Zosyn on 05/21/2021 as she is at high risk of developing abscess.   Assessment/Plan:   High-grade benign distal sigmoid stricture with perforation probable diverticular stricture of the sigmoid/Status post exploratory laparotomy with right hemicolectomy, loop sigmoid colostomy and a perforated cecum on 05/20/2021: Continue to hold Plavix, surgery to dictate when to restart. She completed 7-day course of antibiotics. doing well with physical therapy, she will go home with home health.   Resolved with oral supplementation but still borderline we will continue to replete orally her potassium.  Acute kidney injury: Likely prerenal azotemia, in the setting of ACE inhibitor. Her creatinine has returned to  baseline.  Leukocytosis: Likely due to infectious etiology, resolved.  Chronic combined systolic and diastolic heart failure: Last 2D echo in 2021 showed an EF of 60% with grade 2 diastolic heart failure. Tachycardia has resolved. Continue beta-blockers, tolerating her diet KVO IV fluids.  History of coronary artery disease with a DES stent to the LAD: Holding Plavix surgery to dictate when to restart.  Essential hypertension: Continue to hold losartan blood pressure stable tachycardia improved with IV metoprolol.  Hypokalemia: Continues to be borderline low, replete orally recheck in the morning.  Morbid obesity: Counseling  DVT prophylaxis: scd's Family Communication:sister Status is: Inpatient Remains inpatient appropriate because: Perforated colon     Code Status:     Code Status Orders  (From admission, onward)           Start     Ordered   05/18/21 0729  Full code  Continuous        05/18/21 0729           Code Status History     Date Active Date Inactive Code Status Order ID Comments User Context   05/17/2021 2255 05/18/2021 0729 Full Code [id number]  Kristopher Oppenheim, DO ED   10/13/2019 1348 10/15/2019 1603 Full Code [id number]  Nelva Bush, MD Inpatient      Advance Directive Documentation    Flowsheet Row Most Recent Value  Type of Advance Directive Healthcare Power of Attorney, Living will  Pre-existing out of facility DNR order (yellow form or pink MOST form) --  "MOST" Form in Place? --         IV Access:   Peripheral IV   Procedures and diagnostic  studies:   No results found.   Medical Consultants:   None.   Subjective:    Valerie Fields tolerating her full liquid diet. Objective:    Vitals:   05/26/21 1336 05/26/21 2036 05/27/21 0500 05/27/21 0542  BP: 125/68 139/65  132/72  Pulse: 84 92  96  Resp: 18 18  18   Temp: 98.9 F (37.2 C) 99.4 F (37.4 C)  98.7 F (37.1 C)  TempSrc:  Oral  Oral  SpO2: 95% 94%   94%  Weight:   100.4 kg   Height:       SpO2: 94 % O2 Flow Rate (L/min): 2 L/min   Intake/Output Summary (Last 24 hours) at 05/27/2021 0732 Last data filed at 05/27/2021 0600 Gross per 24 hour  Intake 2917.48 ml  Output 1150 ml  Net 1767.48 ml    Filed Weights   05/25/21 0502 05/26/21 0518 05/27/21 0500  Weight: 100.4 kg 100.4 kg 100.4 kg    Exam: General exam: In no acute distress. Respiratory system: Good air movement and clear to auscultation. Cardiovascular system: S1 & S2 heard, RRR. No JVD. Gastrointestinal system: Abdomen is is nondistended nontender with wound VAC in place. Extremities: No pedal edema. Skin: No rashes, lesions or ulcers Psychiatry: Judgement and insight appear normal. Mood & affect appropriate.   Data Reviewed:    Labs: Basic Metabolic Panel: Recent Labs  Lab 05/21/21 0437 05/21/21 1622 05/22/21 0430 05/23/21 0429 05/24/21 0430 05/25/21 1000 05/26/21 0432 05/27/21 0410  NA 134* 134* 135 133* 137 134* 133* 134*  K 5.7* 4.5 4.4 3.6 3.3* 3.0* 3.1* 3.6  CL 107 107 109 108 110 103 104 107  CO2 18* 20* 19* 20* 22 23 22 23   GLUCOSE 189* 142* 150* 114* 85 119* 91 119*  BUN 10 16 19 20 19 12 9  7*  CREATININE 1.89* 2.04* 1.75* 1.23* 0.89 0.58 0.65 0.64  CALCIUM 8.5* 8.3* 8.1* 8.2* 8.6* 8.5* 8.4* 8.3*  MG 1.9 1.7 1.8 2.4 2.3  --   --   --     GFR Estimated Creatinine Clearance: 73.6 mL/min (by C-G formula based on SCr of 0.64 mg/dL). Liver Function Tests: Recent Labs  Lab 05/21/21 0437 05/21/21 1622 05/22/21 0430  AST 28 30 28   ALT 16 15 16   ALKPHOS 34* 32* 34*  BILITOT 0.8 0.3 0.6  PROT 4.6* 4.5* 4.3*  ALBUMIN 2.1* 2.0* 1.9*    No results for input(s): LIPASE, AMYLASE in the last 168 hours.  No results for input(s): AMMONIA in the last 168 hours. Coagulation profile No results for input(s): INR, PROTIME in the last 168 hours. COVID-19 Labs  No results for input(s): DDIMER, FERRITIN, LDH, CRP in the last 72 hours.  Lab  Results  Component Value Date   SARSCOV2NAA NEGATIVE 05/17/2021   [place] Not Detected 12/09/2019   [place] NEGATIVE 10/13/2019    CBC: Recent Labs  Lab 05/21/21 0437 05/22/21 0430 05/24/21 0430  WBC 11.5* 14.9* 11.7*  NEUTROABS 9.5*  --   --   HGB 15.9* 12.9 12.7  HCT 48.8* 39.8 38.4  MCV 99.8 99.3 96.5  PLT 345 312 293    Cardiac Enzymes: No results for input(s): CKTOTAL, CKMB, CKMBINDEX, TROPONINI in the last 168 hours. BNP (last 3 results) No results for input(s): PROBNP in the last 8760 hours. CBG: No results for input(s): GLUCAP in the last 168 hours. D-Dimer: No results for input(s): DDIMER in the last 72 hours. Hgb A1c: No results for input(s): HGBA1C  in the last 72 hours. Lipid Profile: No results for input(s): CHOL, HDL, LDLCALC, TRIG, CHOLHDL, LDLDIRECT in the last 72 hours. Thyroid function studies: No results for input(s): TSH, T4TOTAL, T3FREE, THYROIDAB in the last 72 hours.  Invalid input(s): FREET3 Anemia work up: No results for input(s): VITAMINB12, FOLATE, FERRITIN, TIBC, IRON, RETICCTPCT in the last 72 hours. Sepsis Labs: Recent Labs  Lab 05/21/21 0437 05/22/21 0430 05/24/21 0430  WBC 11.5* 14.9* 11.7*    Microbiology Recent Results (from the past 240 hour(s))  Resp Panel by RT-PCR (Flu A&B, Covid) Urine, Clean Catch     Status: None   Collection Time: 05/17/21  4:32 PM   Specimen: Urine, Clean Catch; Nasopharyngeal(NP) swabs in vial transport medium  Result Value Ref Range Status   SARS Coronavirus 2 by RT PCR NEGATIVE NEGATIVE Final    Comment: (NOTE) SARS-CoV-2 target nucleic acids are NOT DETECTED.  The SARS-CoV-2 RNA is generally detectable in upper respiratory specimens during the acute phase of infection. The lowest concentration of SARS-CoV-2 viral copies this assay can detect is 138 copies/mL. A negative result does not preclude SARS-Cov-2 infection and should not be used as the sole basis for treatment or other  patient management decisions. A negative result may occur with  improper specimen collection/handling, submission of specimen other than nasopharyngeal swab, presence of viral mutation(s) within the areas targeted by this assay, and inadequate number of viral copies(<138 copies/mL). A negative result must be combined with clinical observations, patient history, and epidemiological information. The expected result is Negative.  Fact Sheet for Patients:  EntrepreneurPulse.com.au  Fact Sheet for Healthcare Providers:  IncredibleEmployment.be  This test is no t yet approved or cleared by the [place] FDA and  has been authorized for detection and/or diagnosis of SARS-CoV-2 by FDA under an Emergency Use Authorization (EUA). This EUA will remain  in effect (meaning this test can be used) for the duration of the COVID-19 declaration under Section 564(b)(1) of the Act, 21 U.S.C.section 360bbb-3(b)(1), unless the authorization is terminated  or revoked sooner.       Influenza A by PCR NEGATIVE NEGATIVE Final   Influenza B by PCR NEGATIVE NEGATIVE Final    Comment: (NOTE) The Xpert Xpress SARS-CoV-2/FLU/RSV plus assay is intended as an aid in the diagnosis of influenza from Nasopharyngeal swab specimens and should not be used as a sole basis for treatment. Nasal washings and aspirates are unacceptable for Xpert Xpress SARS-CoV-2/FLU/RSV testing.  Fact Sheet for Patients: EntrepreneurPulse.com.au  Fact Sheet for Healthcare Providers: IncredibleEmployment.be  This test is not yet approved or cleared by the [place] FDA and has been authorized for detection and/or diagnosis of SARS-CoV-2 by FDA under an Emergency Use Authorization (EUA). This EUA will remain in effect (meaning this test can be used) for the duration of the COVID-19 declaration under Section 564(b)(1) of the Act, 21 U.S.C. section  360bbb-3(b)(1), unless the authorization is terminated or revoked.  Performed at [facility], [place] [address] [place], [place] [address]   Urine Culture     Status: Abnormal   Collection Time: 05/17/21 10:27 PM   Specimen: Urine, Clean Catch  Result Value Ref Range Status   Specimen Description   Final    URINE, CLEAN CATCH Performed at [facility], [place] [address], [place], [place] [address]    Special Requests   Final    NONE Performed at [facility], [place] [address] [place], [place] [address]    Culture >=  100,000 COLONIES/mL ESCHERICHIA COLI (A)  Final   Report Status 05/20/2021 FINAL  Final   Organism ID, Bacteria ESCHERICHIA COLI (A)  Final      Susceptibility   Escherichia coli - MIC*    AMPICILLIN 8 SENSITIVE Sensitive     CEFAZOLIN <=4 SENSITIVE Sensitive     CEFEPIME <=0.12 SENSITIVE Sensitive     CEFTRIAXONE <=0.25 SENSITIVE Sensitive     CIPROFLOXACIN <=0.25 SENSITIVE Sensitive     GENTAMICIN <=1 SENSITIVE Sensitive     IMIPENEM <=0.25 SENSITIVE Sensitive     NITROFURANTOIN <=16 SENSITIVE Sensitive     TRIMETH/SULFA <=20 SENSITIVE Sensitive     AMPICILLIN/SULBACTAM 4 SENSITIVE Sensitive     PIP/TAZO <=4 SENSITIVE Sensitive     * >=100,000 COLONIES/mL ESCHERICHIA COLI  C Difficile Quick Screen w PCR reflex     Status: None   Collection Time: 05/18/21 10:00 AM  Result Value Ref Range Status   C Diff antigen NEGATIVE NEGATIVE Final   C Diff toxin NEGATIVE NEGATIVE Final   C Diff interpretation No C. difficile detected.  Final    Comment: Performed at [facility], [place] [address], [place], [place] [address]     Medications:    acetaminophen  650 mg Oral Q6H   atorvastatin  80 mg Oral Daily   Chlorhexidine Gluconate Cloth  6 each Topical Daily   enoxaparin (LOVENOX) injection  40 mg Subcutaneous Q24H   metoprolol tartrate  2.5 mg Intravenous Q6H   montelukast  10 mg Oral QHS    pantoprazole  40 mg Oral BID   Continuous Infusions:  dextrose 5 % with kcl 75 mL/hr at 05/27/21 0315   lactated ringers 10 mL/hr at 05/20/21 1226   methocarbamol (ROBAXIN) IV        LOS: 10 days   Valerie Fields  [facility]  05/27/2021, 7:32 AM

## 2021-05-27 NOTE — Progress Notes (Signed)
Physical Therapy Treatment Patient Details Name: Valerie Fields MRN: [id number] DOB: 1950-10-08 Today's Date: 05/27/2021   History of Present Illness 71 yo female admitted with colonic obstruction. S/P hemicolectomy, colostomy, wound vac placement 2/22. Hx of NSTEMI, CAD, HF, OSA    PT Comments    Pt has been using BSC independently and assisted with ambulating in hallway.  Pt reports her friend that was going help her once home, has now been admitted to hospital so she plans to d/c to SNF at this time.  Pt feels she will need more time to learn about taking care of her wound and colostomy.     Recommendations for follow up therapy are one component of a multi-disciplinary discharge planning process, led by the attending physician.  Recommendations may be updated based on patient status, additional functional criteria and insurance authorization.  Follow Up Recommendations  Home health PT     Assistance Recommended at Discharge PRN  Patient can return home with the following A little help with walking and/or transfers;A little help with bathing/dressing/bathroom   Equipment Recommendations  Rolling walker (2 wheels)    Recommendations for Other Services       Precautions / Restrictions Precautions Precautions: Fall Precaution Comments: L colostomy, Wound Vac     Mobility  Bed Mobility Overal bed mobility: Needs Assistance Bed Mobility: Rolling, Sidelying to Sit Rolling: Supervision Sidelying to sit: Supervision       General bed mobility comments: required reminder cue to use log roll technique    Transfers Overall transfer level: Needs assistance Equipment used: None Transfers: Sit to/from Stand, Bed to chair/wheelchair/BSC Sit to Stand: Supervision Stand pivot transfers: Supervision         General transfer comment: up to [facility] mostly supervision as already set up for pt    Ambulation/Gait Ambulation/Gait assistance: Min guard Gait Distance (Feet):  200 Feet Assistive device: Rolling walker (2 wheels) Gait Pattern/deviations: Step-through pattern, Decreased stride length       General Gait Details: slow but steady with RW, improved posture observed today   Stairs             Wheelchair Mobility    Modified Rankin (Stroke Patients Only)       Balance                                            Cognition Arousal/Alertness: Awake/alert Behavior During Therapy: WFL for tasks assessed/performed Overall Cognitive Status: Within Functional Limits for tasks assessed                                          Exercises      General Comments        Pertinent Vitals/Pain Pain Assessment Pain Assessment: Faces Faces Pain Scale: Hurts a little bit Pain Location: abdomen with activity Pain Descriptors / Indicators: Discomfort, Sore Pain Intervention(s): Repositioned, Premedicated before session, Monitored during session    Home Living                          Prior Function            PT Goals (current goals can now be found in the care plan section) Progress towards PT goals: Progressing toward goals  Frequency    Min 3X/week      PT Plan Current plan remains appropriate    Co-evaluation              AM-PAC PT "6 Clicks" Mobility   Outcome Measure  Help needed turning from your back to your side while in a flat bed without using bedrails?: A Little Help needed moving from lying on your back to sitting on the side of a flat bed without using bedrails?: A Little Help needed moving to and from a bed to a chair (including a wheelchair)?: A Little Help needed standing up from a chair using your arms (e.g., wheelchair or bedside chair)?: A Little Help needed to walk in hospital room?: A Little Help needed climbing 3-5 steps with a railing? : A Little 6 Click Score: 18    End of Session   Activity Tolerance: Patient tolerated treatment  well Patient left: with call bell/phone within reach;in bed Nurse Communication: Mobility status PT Visit Diagnosis: Difficulty in walking, not elsewhere classified (R26.2)     Time: 1053-1110 PT Time Calculation (min) (ACUTE ONLY): 17 min  Jannette Spanner PT, DPT Acute Rehabilitation Services Pager: [phone] Office: [place] 05/27/2021, 1:03 PM

## 2021-05-27 NOTE — Consult Note (Signed)
[place] Nurse Consult Note: [facility] PA-C Saverio Danker is present for the encounter today.    [place] Nurse wound follow up Wound type: Surgical Measurement:20cm x 5cm x 5cm Wound URK:YHCWC red, friable Drainage (amount, consistency, odor) serosanguinous, small amount with dressing removal Periwound: intact, clear Dressing procedure/placement/frequency: NPWT dressing is removed using medical adhesive remover spray. One piece of black foam removed, wound cleansed with NS and patted dry. A photo is taken of the wound and ostomy for the EMR by Ms. Osborne. A layer of skin barrier ring is placed at the most distal skin wound junction to enhance the NPWT dressing seal. Two pieces of black foam used to obliterate dead space and dressing is secured with drape. A T.R.A.C.C. pad is used to connect dressing to 116mmHg continuous negative pressure and an immediate seal is achieved. Patient tolerated procedure well, but requests pain medication from Bedside RN immediately after.  Two Medium V.A.C. dressing kits are in the room for the remainder of the week.   [place] Nurse ostomy follow up Stoma type/location: LLQ loop colostomy. Red rubber catheter support rod is removed today without incident. Stomal assessment/size: 2-inch oval side-to-side, 1 and 1/2 inch top to bottom opening, stoma is below surface of skin and measures 1 and 1/2 inch. Peristomal assessment: mild erythema Treatment options for stomal/peristomal skin: two skin barrier rings plus convex pouching system Output: brown/green liquid effluent Ostomy pouching: 1pc.convex with skin barrier ring Education provided:  Explained stoma characteristics and that her stoma looked different from the pictures she would see in her Education folder. Demonstrated pouch change (cutting new skin barrier, measuring stoma, cleaning peristomal skin and stoma, use of barrier ring) Education on emptying when 1/3 to 1/2 full and how to empty  Answered patient questions about  Secure Start: When supplies would arrive, what the telephone contacts were for, what her options were for obtaining supplies (mail order vs purchasing from a medical supply store and the advantages/disadvantages of each). Introduced Teacher, adult education of outpatient ostomy clinic today and my recommendation that she follow up there post discharge.   As mentioned above, I recommend/discussed the [facility] as an outpatient resource.  If you agree this would be beneficial, please fax referral, or enter electronically in Epic the referral. (Fax- [phone])   Patient discusses with [facility] PA-C K. Maxwell Caul today that her support system has changed (one of her friends is having a cholecystectomy today) and that she would like to pursue aftercare at [facility] skilled nursing facility.   Enrolled patient in [place] Start Discharge program: Yes  Supplies in room: 5 one-piece convex ostomy pouches, 10 skin barrier rings, 1 bottle stoma powder, belt (Large)  WOC nursing team will follow, and will remain available to this patient, the nursing, surgical and medical teams.    Thank you for allowing [place] to be a part of this patient's care team. Next scheduled visit is Wednesday, March 1.  Maudie Flakes, MSN, RN, [place], [facility]  Pager# [phone]

## 2021-05-27 NOTE — TOC Progression Note (Signed)
Transition of Care [facility]) - Progression Note   Patient Details  Name: Mathea Frieling MRN: [id number] Date of Birth: 1950-12-18  Transition of Care [facility]) CM/SW [place], LCSW Phone Number: 05/27/2021, 2:00 PM  Clinical Narrative: TOC now consulted for SNF due to wound vac. CSW spoke with patient who is agreeable to SNF. CSW explained that patient is only going for wound vac needs as it will need to be changed every Monday, Wednesday, and Friday. Patient lives alone and does not have support at home to assist with changing the wound vac. CSW asked surgical PA if patient can have it changed in the office and was told this is not an option.  FL2 done; PASRR received. Initial referral faxed out. TOC awaiting bed offers.   Expected Discharge Plan: [place] Barriers to Discharge: Continued Medical Work up  Expected Discharge Plan and Services Expected Discharge Plan: [place] In-house Referral: Clinical Social Work Discharge Planning Services: CM Consult Post Acute Care Choice: [place] arrangements for the past 2 months: Single Family Home           DME Arranged: N/A DME Agency: NA HH Arranged: RN [place] Agency: [place] Date [place]: 05/21/21 Representative spoke with at [place]: Levada Dy  Readmission Risk Interventions No flowsheet data found.

## 2021-05-27 NOTE — NC FL2 (Signed)
[place] LEVEL OF CARE SCREENING TOOL     IDENTIFICATION  Patient Name: Valerie Fields Birthdate: 1950-11-07 Sex: female Admission Date (Current Location): 05/17/2021  [facility] and [place] Number:  Herbalist and Address:  [facility],  [address], [place]      Provider Number: [id number]  Attending Physician Name and Address:  Charlynne Cousins, MD  Relative Name and Phone Number:  Selena Batten (friend) Ph: [phone]    Current Level of Care: Hospital Recommended Level of Care: [place] Prior Approval Number:    Date Approved/Denied:   PASRR Number: [id number]  Discharge Plan: SNF    Current Diagnoses: Patient Active Problem List   Diagnosis Date Noted   Perforation of cecum 05/24/2021   S/P exploratory laparotomy 05/20/2021   Large bowel obstruction ([place]) 05/20/2021   Stricture of sigmoid colon ([place])    Colonic obstruction ([place]) 05/17/2021   Primary hypertension 05/17/2021   CAD (coronary artery disease), native coronary artery 05/17/2021   Hypokalemia 05/17/2021   OSA (obstructive sleep apnea) 06/27/2020   Chronic combined systolic and diastolic CHF (congestive heart failure) ([place]) 10/15/2019   Hyperlipidemia 10/15/2019   GERD (gastroesophageal reflux disease) 10/15/2019   Ischemic cardiomyopathy 10/15/2019    Orientation RESPIRATION BLADDER Height & Weight     Self, Time, Situation, Place  Normal Continent Weight: 221 lb 5.5 oz (100.4 kg) Height:  5\' 2"  (157.5 cm)  BEHAVIORAL SYMPTOMS/MOOD NEUROLOGICAL BOWEL NUTRITION STATUS     (N/A) Continent, Colostomy Diet (Soft diet)  AMBULATORY STATUS COMMUNICATION OF NEEDS Skin   Limited Assist Verbally Surgical wounds (Wound size: 20cm x 5cm x 5cm. NPWT dressing removed w/medical adhesive remover spray. 2 pieces black foam used for dead space; dressing is secured with drape. A TRACC pad is used to connect dressing to 130mmHg  continuous negative pressure.)                       Personal Care Assistance Level of Assistance  Bathing, Feeding, Dressing Bathing Assistance: Limited assistance Feeding assistance: Independent Dressing Assistance: Limited assistance     Functional Limitations Info  Sight, Hearing, Speech Sight Info: Adequate Hearing Info: Adequate Speech Info: Adequate    SPECIAL CARE FACTORS FREQUENCY                       Contractures Contractures Info: Not present    Additional Factors Info  Code Status, Allergies Code Status Info: Full Allergies Info: Soy Allergy, Sulfa Antibiotics           Current Medications (05/27/2021):  This is the current hospital active medication list Current Facility-Administered Medications  Medication Dose Route Frequency Provider Last Rate Last Admin   0.9 %  sodium chloride infusion   Intravenous Continuous Charlynne Cousins, MD       acetaminophen (TYLENOL) tablet 650 mg  650 mg Oral Q6H Romana Juniper A, MD   650 mg at 05/27/21 0820   atorvastatin (LIPITOR) tablet 80 mg  80 mg Oral Daily Jillyn Ledger, PA-C   80 mg at 05/27/21 1030   Chlorhexidine Gluconate Cloth 2 % PADS 6 each  6 each Topical Daily Nita Sells, MD   6 each at 05/27/21 1055   enoxaparin (LOVENOX) injection 40 mg  40 mg Subcutaneous Q24H Dimple Nanas, RPH   40 mg at 05/27/21 1032   HYDROmorphone (DILAUDID) injection 0.5 mg  0.5 mg Intravenous Q4H PRN  Saverio Danker, PA-C       lactated ringers infusion   Intravenous Continuous Jillyn Ledger, PA-C 10 mL/hr at 05/20/21 1226 New Bag at 05/20/21 1558   methocarbamol (ROBAXIN) 500 mg in dextrose 5 % 50 mL IVPB  500 mg Intravenous Q6H PRN Jillyn Ledger, PA-C       metoprolol tartrate (LOPRESSOR) injection 2.5 mg  2.5 mg Intravenous Q6H Charlynne Cousins, MD   2.5 mg at 05/27/21 0556   montelukast (SINGULAIR) tablet 10 mg  10 mg Oral QHS Jillyn Ledger, PA-C   10 mg at 05/26/21 2142    ondansetron (ZOFRAN) tablet 4 mg  4 mg Oral Q6H PRN Jillyn Ledger, PA-C       Or   ondansetron [facility]) injection 4 mg  4 mg Intravenous Q6H PRN Jillyn Ledger, PA-C       oxyCODONE (Oxy IR/ROXICODONE) immediate release tablet 5-10 mg  5-10 mg Oral Q4H PRN Jillyn Ledger, PA-C   10 mg at 05/27/21 1037   pantoprazole (PROTONIX) EC tablet 40 mg  40 mg Oral BID Jillyn Ledger, PA-C   40 mg at 05/27/21 1030   potassium chloride SA (KLOR-CON M) CR tablet 40 mEq  40 mEq Oral BID Charlynne Cousins, MD   40 mEq at 05/27/21 1030     Discharge Medications: Please see discharge summary for a list of discharge medications.  Relevant Imaging Results:  Relevant Lab Results:   Additional Information SSN: [id number]              Patient would be coming to rehab for wound vac changes MWF.  Sherie Don, LCSW

## 2021-05-27 NOTE — Care Management Important Message (Signed)
Important Message  Patient Details IM Letter placed in Patients room. Name: Valerie Fields MRN: [id number] Date of Birth: 26-Feb-1951   Medicare Important Message Given:  Yes     Kerin Salen 05/27/2021, 2:40 PM

## 2021-05-28 DIAGNOSIS — K56609 Unspecified intestinal obstruction, unspecified as to partial versus complete obstruction: Secondary | ICD-10-CM | POA: Diagnosis not present

## 2021-05-28 DIAGNOSIS — I2510 Atherosclerotic heart disease of native coronary artery without angina pectoris: Secondary | ICD-10-CM | POA: Diagnosis not present

## 2021-05-28 DIAGNOSIS — I5042 Chronic combined systolic (congestive) and diastolic (congestive) heart failure: Secondary | ICD-10-CM | POA: Diagnosis not present

## 2021-05-28 MED ORDER — POTASSIUM CHLORIDE CRYS ER 20 MEQ PO TBCR
40.0000 meq | EXTENDED_RELEASE_TABLET | Freq: Two times a day (BID) | ORAL | Status: AC
  Administered 2021-05-28 (×2): 40 meq via ORAL
  Filled 2021-05-28 (×2): qty 2

## 2021-05-28 NOTE — TOC Progression Note (Signed)
Transition of Care [facility]) - Progression Note   Patient Details  Name: Valerie Fields MRN: [id number] Date of Birth: 09-Sep-1950  Transition of Care [facility]) CM/SW [place], LCSW Phone Number: 05/28/2021, 1:07 PM  Clinical Narrative: Patient has not received any bed offers for rehab for the wound vac. CSW notified by Levada Dy with [facility] that the agency can send out an RN to assist with changing the wound vac MWF. CSW spoke with patient to discuss the lack of bed offers and to see if she would be agreeable to going home with [facility] since the agency has agreed to the wound vac changes. Patient is agreeable to [facility] as the agency can come 3 times/week.  CSW made referral for [facility] wound vac to [place] with [facility]. Olivia Mackie to send e-script to surgical PA so the approval process can be started. TOC to follow.  Expected Discharge Plan: [place] Barriers to Discharge: Continued Medical Work up  Expected Discharge Plan and Services Expected Discharge Plan: [place] In-house Referral: Clinical Social Work Discharge Planning Services: CM Consult Post Acute Care Choice: [place] arrangements for the past 2 months: Single Family Home           DME Arranged: N/A DME Agency: NA HH Arranged: RN [place] Agency: [place] Date [place]: 05/21/21 Representative spoke with at [place]: Levada Dy  Readmission Risk Interventions No flowsheet data found.

## 2021-05-28 NOTE — Progress Notes (Signed)
8 Days Post-Op  Subjective: CC: Doing well.  Tolerating soft diet.  No new complaints.  Objective: Vital signs in last 24 hours: Temp:  [98.7 F (37.1 C)-99.3 F (37.4 C)] 99.3 F (37.4 C) (02/28 0543) Pulse Rate:  [94-106] 94 (02/28 0543) Resp:  [16-18] 18 (02/28 0543) BP: (137-150)/(63-77) 150/77 (02/28 0543) SpO2:  [93 %-97 %] 93 % (02/28 0543) Last BM Date : 05/26/21  Intake/Output from previous day: 02/27 0701 - 02/28 0700 In: 1419.3 [P.O.:1200; I.V.:219.3] Out: 1700 [Stool:1700] Intake/Output this shift: No intake/output data recorded.  PE: Gen:  Alert, NAD, pleasant Abd: Obese but soft, Appropriately tender.  Midline wound with VAC in place.  Ostomy is retract, Patent and working well  Lab Results:  No results for input(s): WBC, HGB, HCT, PLT in the last 72 hours.  BMET Recent Labs    05/26/21 0432 05/27/21 0410  NA 133* 134*  K 3.1* 3.6  CL 104 107  CO2 22 23  GLUCOSE 91 119*  BUN 9 7*  CREATININE 0.65 0.64  CALCIUM 8.4* 8.3*   PT/INR No results for input(s): LABPROT, INR in the last 72 hours. CMP     Component Value Date/Time   NA 134 (L) 05/27/2021 0410   NA 141 10/10/2020 0949   K 3.6 05/27/2021 0410   CL 107 05/27/2021 0410   CO2 23 05/27/2021 0410   GLUCOSE 119 (H) 05/27/2021 0410   BUN 7 (L) 05/27/2021 0410   BUN 13 10/10/2020 0949   CREATININE 0.64 05/27/2021 0410   CALCIUM 8.3 (L) 05/27/2021 0410   PROT 4.3 (L) 05/22/2021 0430   PROT 6.8 10/10/2020 0949   ALBUMIN 1.9 (L) 05/22/2021 0430   ALBUMIN 4.4 10/10/2020 0949   AST 28 05/22/2021 0430   ALT 16 05/22/2021 0430   ALKPHOS 34 (L) 05/22/2021 0430   BILITOT 0.6 05/22/2021 0430   BILITOT 0.6 10/10/2020 0949   GFRNONAA >60 05/27/2021 0410   GFRAA 86 01/30/2020 1050   Lipase     Component Value Date/Time   LIPASE 31 05/17/2021 1632    Studies/Results: No results found.  Anti-infectives: Anti-infectives (From admission, onward)    Start     Dose/Rate Route Frequency  Ordered Stop   05/21/21 1615  piperacillin-tazobactam (ZOSYN) IVPB 3.375 g        3.375 g 12.5 mL/hr over 240 Minutes Intravenous Every 8 hours 05/21/21 1526 05/26/21 1020   05/20/21 0845  cefoTEtan (CEFOTAN) 2 g in sodium chloride 0.9 % 100 mL IVPB       Note to Pharmacy: Pharmacy to adjust dose as needed   2 g 200 mL/hr over 30 Minutes Intravenous On call to O.R. 05/20/21 0753 05/20/21 1436   05/18/21 1000  ciprofloxacin (CIPRO) IVPB 400 mg  Status:  Discontinued        400 mg 200 mL/hr over 60 Minutes Intravenous Every 12 hours 05/18/21 0556 05/21/21 1519   05/18/21 1000  metroNIDAZOLE (FLAGYL) IVPB 500 mg  Status:  Discontinued        500 mg 100 mL/hr over 60 Minutes Intravenous Every 12 hours 05/18/21 0556 05/21/21 1519   05/17/21 2200  ciprofloxacin (CIPRO) IVPB 400 mg        400 mg 200 mL/hr over 60 Minutes Intravenous  Once 05/17/21 2149 05/17/21 2320   05/17/21 2200  metroNIDAZOLE (FLAGYL) IVPB 500 mg        500 mg 100 mL/hr over 60 Minutes Intravenous  Once 05/17/21 2149 05/17/21 2315  Assessment/Plan POD 8 s/p Exploratory laparotomy, right hemicolectomy, loop sigmoid colostomy for Very distal colonic stricture extremely deep in the pelvis and perforated cecum - Dr. Kae Heller, 05/20/2021 - Cont IV abx x 5 days post op, these have been stopped. - tol soft diet - Monitor stoma. Retracted but viable. Red rubber removed 2/27 - Negative pressure wound therapy M/W/F, can shower tomorrow after removal and before replacement - WOCN following for new ostomy and vac - Mobilize, Pulm toilet -patient would like SNF for wound care as she lives by herself and her friend that was going to help is now unable to  FEN - soft diet VTE - SCDs, Lovenox. ID - Cipro/Flagyl 2/17-21, Zosyn 2/21 >> 2/26 Foley -none  - Per TRH - Hyperkalemia -improved AKI - 0.64 E. Coli UTI Hx CAD s/p DES 2021 - can likely resume plavix at this point HTN HLD OSA Hx CHF    LOS: 11 days    Valerie Fields , MD [facility] 05/28/2021, 10:15 AM Please see Amion for floor coverage during day hours 7:00am-4:30pm

## 2021-05-28 NOTE — Progress Notes (Signed)
Occupational Therapy Treatment Patient Details Name: Valerie Fields MRN: [id number] DOB: [birth year] Today's Date: 05/28/2021   History of present illness 71 yo female admitted with colonic obstruction. S/P hemicolectomy, colostomy, wound vac placement 2/22. Hx of NSTEMI, CAD, HF, OSA   OT comments  Review AE with patient, states she recalls use of sock aid and has ordered AE for home. Patient mod I for bed mobility and supervision level for ambulation to/from bathroom while using IV pole to steady. Patient tolerate standing at sink for ~5 minutes for grooming/hygiene before transferring to recliner. Patient asks how long she has to sit up, encourage patient to stay out of bed as much as she can tolerate.    Recommendations for follow up therapy are one component of a multi-disciplinary discharge planning process, led by the attending physician.  Recommendations may be updated based on patient status, additional functional criteria and insurance authorization.    Follow Up Recommendations  Home health OT    Assistance Recommended at Discharge Intermittent Supervision/Assistance  Patient can return home with the following  Assistance with cooking/housework;Help with stairs or ramp for entrance   Equipment Recommendations  None recommended by OT       Precautions / Restrictions Precautions Precautions: Fall Precaution Comments: L colostomy, Wound Vac Restrictions Weight Bearing Restrictions: No       Mobility Bed Mobility Overal bed mobility: Modified Independent                     Balance Overall balance assessment: Mild deficits observed, not formally tested                                         ADL either performed or assessed with clinical judgement   ADL Overall ADL's : Needs assistance/impaired     Grooming: Oral care;Wash/dry face;Supervision/safety;Standing                 Lower Body Dressing Details (indicate cue type and  reason): Patient recalls use of LH AE during previous OT session and states she ordered one for home. Toilet Transfer: Supervision/safety;Ambulation Toilet Transfer Details (indicate cue type and reason): Patient utilizing IV pole to ambulate to/from bathroom                  Cognition Arousal/Alertness: Awake/alert Behavior During Therapy: WFL for tasks assessed/performed Overall Cognitive Status: Within Functional Limits for tasks assessed                                                     Pertinent Vitals/ Pain       Pain Assessment Pain Assessment: Faces Faces Pain Scale: Hurts little more Pain Location: abdomen with activity Pain Descriptors / Indicators: Discomfort, Sore Pain Intervention(s): Monitored during session         Frequency  Min 2X/week        Progress Toward Goals  OT Goals(current goals can now be found in the care plan section)  Progress towards OT goals: Progressing toward goals  Acute Rehab OT Goals Patient Stated Goal: Go to rehab-wound vac care OT Goal Formulation: With patient Time For Goal Achievement: 06/06/21 Potential to Achieve Goals: Good ADL Goals Pt Will Perform Lower Body Dressing: with modified  independence;with adaptive equipment;sit to/from stand;sitting/lateral leans Pt Will Transfer to Toilet: with modified independence;ambulating (elevated toilet) Pt Will Perform Toileting - Clothing Manipulation and hygiene: with modified independence;sit to/from stand;sitting/lateral leans Additional ADL Goal #1: Patient will tolerate 10 minutes of out of bed activity in order to participate in daily routine.  Plan Discharge plan remains appropriate       AM-PAC OT "6 Clicks" Daily Activity     Outcome Measure   Help from another person eating meals?: None Help from another person taking care of personal grooming?: A Little Help from another person toileting, which includes using toliet, bedpan, or urinal?:  None Help from another person bathing (including washing, rinsing, drying)?: A Little Help from another person to put on and taking off regular upper body clothing?: None Help from another person to put on and taking off regular lower body clothing?: A Little 6 Click Score: 21    End of Session Equipment Utilized During Treatment: Rolling walker (2 wheels)  OT Visit Diagnosis: Other abnormalities of gait and mobility (R26.89)   Activity Tolerance Patient tolerated treatment well   Patient Left in chair;with call bell/phone within reach;with family/visitor present   Nurse Communication Mobility status        Time: 0911-0930 OT Time Calculation (min): 19 min  Charges: OT General Charges $OT Visit: 1 Visit OT Treatments $Self Care/Home Management : 8-22 mins  Delbert Phenix OT OT pager: [place] 05/28/2021, 12:15 PM

## 2021-05-28 NOTE — Progress Notes (Signed)
[facility] PROGRESS NOTE    Progress Note  Valerie Fields  WNI:[id number] DOB: 28-May-1950 DOA: 05/17/2021 PCP: Marda Stalker, PA-C     Brief Narrative:   Valerie Fields is an 71 y.o. female past medical history coronary artery disease with a history of NSTEMI cath in 2021 that showed a single-vessel disease and high-grade LAD stenosis status post drug-eluting stents, combined systolic and diastolic heart failure with an EF of 60% by 2D echo on 04/24/2021, obstructive sleep apnea presents to the ER from the GI office due to multiple episodes of abdominal pain and bloating there was a concern for acute diverticulitis flare patient started having symptoms about 10 days prior to admission started on Augmentin for UTI saw GI in February 2016 for abdominal pain started on demeclocycline in the ED was seen by GI and general surgery had a flex sig, underwent Perative management in 05/20/2021 with a right colectomy and loop sigmoid colostomy this had to be doing openly due to distal colonic stricture in the pelvis and had a perforated cecum for which she developed feculent peritonitis at the time of surgery.She was started on IV antibiotics Cipro and Flagyl on 05/17/2021, transition to IV Zosyn on 05/21/2021 as she is at high risk of developing abscess.   Assessment/Plan:   High-grade benign distal sigmoid stricture with perforation probable diverticular stricture of the sigmoid/Status post exploratory laparotomy with right hemicolectomy, loop sigmoid colostomy and a perforated cecum on 05/20/2021: Continue to hold Plavix, surgery to dictate when to restart. She completed 7-day course of antibiotics. doing well with physical therapy, she will go home with home health.   Surgery rec negative pressure wound therapy Monday Wednesday and Friday. Diet has been advanced to soft.  Acute kidney injury: Likely prerenal azotemia, in the setting of ACE inhibitor. Her creatinine has returned to  baseline.  Leukocytosis: Likely due to infectious etiology, resolved.  Chronic combined systolic and diastolic heart failure: Continue beta-blockers,. Appears to be euvolemic.  History of coronary artery disease with a DES stent to the LAD: Holding Plavix surgery to dictate when to restart.  Essential hypertension: Continue to hold losartan blood pressure stable tachycardia improved with IV metoprolol.  Hypokalemia: Replete orally now resolved try to keep closer to 4  Morbid obesity: Counseling  DVT prophylaxis: scd's Family Communication:sister Status is: Inpatient Remains inpatient appropriate because: Perforated colon     Code Status:     Code Status Orders  (From admission, onward)           Start     Ordered   05/18/21 0729  Full code  Continuous        05/18/21 0729           Code Status History     Date Active Date Inactive Code Status Order ID Comments User Context   05/17/2021 2255 05/18/2021 0729 Full Code [id number]  Kristopher Oppenheim, DO ED   10/13/2019 1348 10/15/2019 1603 Full Code [id number]  Nelva Bush, MD Inpatient      Advance Directive Documentation    Flowsheet Row Most Recent Value  Type of Advance Directive Healthcare Power of [place], Living will  Pre-existing out of facility DNR order (yellow form or pink MOST form) --  "MOST" Form in Place? --         IV Access:   Peripheral IV   Procedures and diagnostic studies:   No results found.   Medical Consultants:   None.   Subjective:    [name]  [place] tolerating her soft diet not in a good mood was given bad news about skilled Objective:    Vitals:   05/27/21 0542 05/27/21 1359 05/27/21 2131 05/28/21 0543  BP: 132/72 138/63 137/67 (!) 150/77  Pulse: 96 (!) 106 94 94  Resp: 18 16 18 18   Temp: 98.7 F (37.1 C) 99.3 F (37.4 C) 98.7 F (37.1 C) 99.3 F (37.4 C)  TempSrc: Oral Oral Oral Oral  SpO2: 94% 96% 97% 93%  Weight:      Height:       SpO2:  93 % O2 Flow Rate (L/min): 2 L/min   Intake/Output Summary (Last 24 hours) at 05/28/2021 0802 Last data filed at 05/28/2021 0600 Gross per 24 hour  Intake 1299.34 ml  Output 1700 ml  Net -400.66 ml    Filed Weights   05/25/21 0502 05/26/21 0518 05/27/21 0500  Weight: 100.4 kg 100.4 kg 100.4 kg    Exam: General exam: In no acute distress. Respiratory system: Good air movement and clear to auscultation. Cardiovascular system: S1 & S2 heard, RRR. No JVD. Gastrointestinal system: Abdomen is is nondistended nontender with wound VAC in place. Extremities: No pedal edema. Skin: No rashes, lesions or ulcers Psychiatry: Judgement and insight appear normal. Mood & affect appropriate.   Data Reviewed:    Labs: Basic Metabolic Panel: Recent Labs  Lab 05/21/21 1622 05/22/21 0430 05/23/21 0429 05/24/21 0430 05/25/21 1000 05/26/21 0432 05/27/21 0410  NA 134* 135 133* 137 134* 133* 134*  K 4.5 4.4 3.6 3.3* 3.0* 3.1* 3.6  CL 107 109 108 110 103 104 107  CO2 20* 19* 20* 22 23 22 23   GLUCOSE 142* 150* 114* 85 119* 91 119*  BUN 16 19 20 19 12 9  7*  CREATININE 2.04* 1.75* 1.23* 0.89 0.58 0.65 0.64  CALCIUM 8.3* 8.1* 8.2* 8.6* 8.5* 8.4* 8.3*  MG 1.7 1.8 2.4 2.3  --   --   --     GFR Estimated Creatinine Clearance: 73.6 mL/min (by C-G formula based on SCr of 0.64 mg/dL). Liver Function Tests: Recent Labs  Lab 05/21/21 1622 05/22/21 0430  AST 30 28  ALT 15 16  ALKPHOS 32* 34*  BILITOT 0.3 0.6  PROT 4.5* 4.3*  ALBUMIN 2.0* 1.9*    No results for input(s): LIPASE, AMYLASE in the last 168 hours.  No results for input(s): AMMONIA in the last 168 hours. Coagulation profile No results for input(s): INR, PROTIME in the last 168 hours. COVID-19 Labs  No results for input(s): DDIMER, FERRITIN, LDH, CRP in the last 72 hours.  Lab Results  Component Value Date   SARSCOV2NAA NEGATIVE 05/17/2021   [place] Not Detected 12/09/2019   [place] NEGATIVE 10/13/2019     CBC: Recent Labs  Lab 05/22/21 0430 05/24/21 0430  WBC 14.9* 11.7*  HGB 12.9 12.7  HCT 39.8 38.4  MCV 99.3 96.5  PLT 312 293    Cardiac Enzymes: No results for input(s): CKTOTAL, CKMB, CKMBINDEX, TROPONINI in the last 168 hours. BNP (last 3 results) No results for input(s): PROBNP in the last 8760 hours. CBG: No results for input(s): GLUCAP in the last 168 hours. D-Dimer: No results for input(s): DDIMER in the last 72 hours. Hgb A1c: No results for input(s): HGBA1C in the last 72 hours. Lipid Profile: No results for input(s): CHOL, HDL, LDLCALC, TRIG, CHOLHDL, LDLDIRECT in the last 72 hours. Thyroid function studies: No results for input(s): TSH, T4TOTAL, T3FREE, THYROIDAB in the last 72 hours.  Invalid input(s): FREET3  Anemia work up: No results for input(s): VITAMINB12, FOLATE, FERRITIN, TIBC, IRON, RETICCTPCT in the last 72 hours. Sepsis Labs: Recent Labs  Lab 05/22/21 0430 05/24/21 0430  WBC 14.9* 11.7*    Microbiology Recent Results (from the past 240 hour(s))  C Difficile Quick Screen w PCR reflex     Status: None   Collection Time: 05/18/21 10:00 AM  Result Value Ref Range Status   C Diff antigen NEGATIVE NEGATIVE Final   C Diff toxin NEGATIVE NEGATIVE Final   C Diff interpretation No C. difficile detected.  Final    Comment: Performed at [facility], [place] [address] [place], [place] [address]     Medications:    acetaminophen  650 mg Oral Q6H   atorvastatin  80 mg Oral Daily   Chlorhexidine Gluconate Cloth  6 each Topical Daily   enoxaparin (LOVENOX) injection  40 mg Subcutaneous Q24H   metoprolol tartrate  2.5 mg Intravenous Q6H   montelukast  10 mg Oral QHS   pantoprazole  40 mg Oral BID   Continuous Infusions:  lactated ringers 10 mL/hr at 05/20/21 1226   methocarbamol (ROBAXIN) IV        LOS: 11 days   Charlynne Cousins  [facility]  05/28/2021, 8:02 AM

## 2021-05-29 MED ORDER — METHOCARBAMOL 500 MG PO TABS: 500.0000 mg | ORAL_TABLET | Freq: Four times a day (QID) | ORAL | 0 refills | Status: DC | PRN

## 2021-05-29 MED ORDER — METHOCARBAMOL 500 MG PO TABS
500.0000 mg | ORAL_TABLET | Freq: Four times a day (QID) | ORAL | Status: DC | PRN
  Administered 2021-05-29: 500 mg via ORAL
  Filled 2021-05-29: qty 1

## 2021-05-29 MED ORDER — OXYCODONE HCL 5 MG PO TABS: 5.0000 mg | ORAL_TABLET | Freq: Four times a day (QID) | ORAL | 0 refills | Status: DC | PRN

## 2021-05-29 MED ORDER — KETOROLAC TROMETHAMINE 15 MG/ML IJ SOLN
15.0000 mg | Freq: Three times a day (TID) | INTRAMUSCULAR | Status: DC | PRN
  Administered 2021-05-29 (×2): 15 mg via INTRAVENOUS
  Filled 2021-05-29 (×2): qty 1

## 2021-05-29 MED ORDER — ACETAMINOPHEN 500 MG PO TABS: 1000.0000 mg | ORAL_TABLET | Freq: Four times a day (QID) | ORAL | Status: DC | PRN

## 2021-05-29 NOTE — Progress Notes (Signed)
Occupational Therapy Treatment ?Patient Details ?Name: Valerie Fields ?MRN: [id number] ?DOB: [birth year] ?Today's Date: 05/29/2021 ? ? ?History of present illness 71 yo female admitted with colonic obstruction. S/P hemicolectomy, colostomy, wound vac placement 2/22. Hx of NSTEMI, CAD, HF, OSA ?  ?OT comments ? Patient was sitting EOB with family in room at start of session. Patient and family in room reported concerns over transitioning home on 3/2. Patient and family were educated on ECT, safety tips at home, and suggestions for placement of 3 in 1. Patient declined to participate in practicing bed mobility at this time. Patient would continue to benefit from skilled OT services at this time while admitted and after d/c to address noted deficits in order to improve overall safety and independence in ADLs.  ?   ? ?Recommendations for follow up therapy are one component of a multi-disciplinary discharge planning process, led by the attending physician.  Recommendations may be updated based on patient status, additional functional criteria and insurance authorization. ?   ?Follow Up Recommendations ? Home health OT  ?  ?Assistance Recommended at Discharge Intermittent Supervision/Assistance  ?Patient can return home with the following ? Assistance with cooking/housework;Help with stairs or ramp for entrance ?  ?Equipment Recommendations ? None recommended by OT  ?  ?Recommendations for Other Services   ? ?  ?Precautions / Restrictions Precautions ?Precautions: Fall ?Precaution Comments: L colostomy, Wound Vac ?Restrictions ?Weight Bearing Restrictions: No  ? ? ?  ? ?Mobility   ?Balance   ? ?ADL either performed or assessed with clinical judgement  ? ?ADL Overall ADL's : Needs assistance/impaired ?  ?  ?  ?  ?  ?  ?  ?  ?  ?  ?  ?  ?  ?  ?  ?  ?  ?  ?  ?General ADL Comments: patient declined to participate in any tasks at this time reporting that she showered herself this AM. patient and loved ones in room were  voicing concerns about patient transitioning home on 3/2. patient and family members were educated on general tips for environmental modifications at home, ECT, safety. patient and family members verbalized understanding. patient expressed concerns over not being able to preform bed mobility at home without bed rails. patient was educated on strategies but patient declined to trial them at this time. ?  ? ?Extremity/Trunk Assessment   ?  ?  ?  ?  ?  ? ?Vision   ?  ?  ?Perception   ?  ?Praxis   ?  ? ?Cognition Arousal/Alertness: Awake/alert ?Behavior During Therapy: [facility] for tasks assessed/performed ?Overall Cognitive Status: Within Functional Limits for tasks assessed ?  ?  ?  ?  ?  ?  ?  ?  ?  ?  ?  ?  ?  ?  ?  ?  ?  ?  ?  ?   ?Exercises   ? ?  ?Shoulder Instructions   ? ? ?  ?General Comments    ? ? ?Pertinent Vitals/ Pain       Pain Assessment ?Pain Assessment: Faces ?Faces Pain Scale: No hurt ? ?Home Living   ?  ?  ?  ?  ?  ?  ?  ?  ?  ?  ?  ?  ?  ?  ?  ?  ?  ?  ? ?  ?Prior Functioning/Environment    ?  ?  ?  ?   ? ?Frequency ? Min 2X/week  ? ? ? ? ?  ?  Progress Toward Goals ? ?OT Goals(current goals can now be found in the care plan section) ? Progress towards OT goals: Progressing toward goals ? ?   ?Plan Discharge plan remains appropriate   ? ?Co-evaluation ? ? ?   ?  ?  ?  ?  ? ?  ?AM-PAC OT "6 Clicks" Daily Activity     ?Outcome Measure ? ? Help from another person eating meals?: None ?Help from another person taking care of personal grooming?: A Little ?Help from another person toileting, which includes using toliet, bedpan, or urinal?: None ?Help from another person bathing (including washing, rinsing, drying)?: A Little ?Help from another person to put on and taking off regular upper body clothing?: None ?Help from another person to put on and taking off regular lower body clothing?: A Little ?6 Click Score: 21 ? ?  ?End of Session   ? ?OT Visit Diagnosis: Other abnormalities of gait and mobility  (R26.89) ?  ?Activity Tolerance Patient tolerated treatment well ?  ?Patient Left in bed;with call bell/phone within reach;with family/visitor present ?  ?Nurse Communication Other (comment) (OK for patient to participate in session) ?  ? ?   ? ?Time: 8850-2774 ?OT Time Calculation (min): 10 min ? ?Charges: OT General Charges ?$OT Visit: 1 Visit ?OT Treatments ?$Therapeutic Activity: 8-22 mins ? ?Valerie Fields OTR/L, MS ?Acute Rehabilitation Department ?Office# [phone] ?Pager# [phone] ? ? ?[place] ?05/29/2021, 3:34 PM ?

## 2021-05-29 NOTE — Progress Notes (Addendum)
? ? ?9 Days Post-Op  ?Subjective: ?CC: ?Doing well.  Tolerating soft diet.  Only new complaint is chest pain that started after pulling herself up in bed last night and thinks she pulled a muscle in her chest.  Pain is with movement and with breathing.  This does not seem cardiac in nature at all ? ?Objective: ?Vital signs in last 24 hours: ?Temp:  [98 ?F (36.7 ?C)-99.2 ?F (37.3 ?C)] 98.8 ?F (37.1 ?C) (03/01 0554) ?Pulse Rate:  [88-90] 89 (03/01 0554) ?Resp:  [15-16] 15 (02/28 2127) ?BP: (134-135)/(61-72) 135/69 (03/01 0554) ?SpO2:  [96 %] 96 % (03/01 0554) ?Last BM Date : 05/26/21 ? ?Intake/Output from previous day: ?02/28 0701 - 03/01 0700 ?In: 1080 [P.O.:1080] ?Out: 3605 [Urine:2600; TKZSW:1093] ?Intake/Output this shift: ?No intake/output data recorded. ? ?PE: ?Gen:  Alert, NAD, pleasant ?Abd: Obese but soft, Appropriately tender.  Midline wound with VAC in place.  Ostomy is retract, Patent and working well ? ?Lab Results:  ?No results for input(s): WBC, HGB, HCT, PLT in the last 72 hours. ? ?BMET ?Recent Labs  ?  05/27/21 ?0410  ?NA 134*  ?K 3.6  ?CL 107  ?CO2 23  ?GLUCOSE 119*  ?BUN 7*  ?CREATININE 0.64  ?CALCIUM 8.3*  ? ?PT/INR ?No results for input(s): LABPROT, INR in the last 72 hours. ?CMP  ?   ?Component Value Date/Time  ? NA 134 (L) 05/27/2021 0410  ? NA 141 10/10/2020 0949  ? K 3.6 05/27/2021 0410  ? CL 107 05/27/2021 0410  ? CO2 23 05/27/2021 0410  ? GLUCOSE 119 (H) 05/27/2021 0410  ? BUN 7 (L) 05/27/2021 0410  ? BUN 13 10/10/2020 0949  ? CREATININE 0.64 05/27/2021 0410  ? CALCIUM 8.3 (L) 05/27/2021 0410  ? PROT 4.3 (L) 05/22/2021 0430  ? PROT 6.8 10/10/2020 0949  ? ALBUMIN 1.9 (L) 05/22/2021 0430  ? ALBUMIN 4.4 10/10/2020 0949  ? AST 28 05/22/2021 0430  ? ALT 16 05/22/2021 0430  ? ALKPHOS 34 (L) 05/22/2021 0430  ? BILITOT 0.6 05/22/2021 0430  ? BILITOT 0.6 10/10/2020 0949  ? GFRNONAA >60 05/27/2021 0410  ? GFRAA 86 01/30/2020 1050  ? ?Lipase  ?   ?Component Value Date/Time  ? LIPASE 31 05/17/2021  1632  ? ? ?Studies/Results: ?No results found. ? ?Anti-infectives: ?Anti-infectives (From admission, onward)  ? ? Start     Dose/Rate Route Frequency Ordered Stop  ? 05/21/21 1615  piperacillin-tazobactam (ZOSYN) IVPB 3.375 g       ? 3.375 g ?12.5 mL/hr over 240 Minutes Intravenous Every 8 hours 05/21/21 1526 05/26/21 1020  ? 05/20/21 0845  cefoTEtan (CEFOTAN) 2 g in sodium chloride 0.9 % 100 mL IVPB       ?Note to Pharmacy: Pharmacy to adjust dose as needed  ? 2 g ?200 mL/hr over 30 Minutes Intravenous On call to O.R. 05/20/21 0753 05/20/21 1436  ? 05/18/21 1000  ciprofloxacin (CIPRO) IVPB 400 mg  Status:  Discontinued       ? 400 mg ?200 mL/hr over 60 Minutes Intravenous Every 12 hours 05/18/21 0556 05/21/21 1519  ? 05/18/21 1000  metroNIDAZOLE (FLAGYL) IVPB 500 mg  Status:  Discontinued       ? 500 mg ?100 mL/hr over 60 Minutes Intravenous Every 12 hours 05/18/21 0556 05/21/21 1519  ? 05/17/21 2200  ciprofloxacin (CIPRO) IVPB 400 mg       ? 400 mg ?200 mL/hr over 60 Minutes Intravenous  Once 05/17/21 2149 05/17/21 2320  ? 05/17/21  2200  metroNIDAZOLE (FLAGYL) IVPB 500 mg       ? 500 mg ?100 mL/hr over 60 Minutes Intravenous  Once 05/17/21 2149 05/17/21 2315  ? ?  ? ? ? ?Assessment/Plan ?POD 9 s/p Exploratory laparotomy, right hemicolectomy, loop sigmoid colostomy for Very distal colonic stricture extremely deep in the pelvis and perforated cecum - Dr. Kae Heller, 05/20/2021 ?- Cont IV abx x 5 days post op, these have been stopped. ?- tol soft diet ?- Monitor stoma. Retracted but viable. Red rubber removed 2/27.  Referral made for ostomy clinic ?- Negative pressure wound therapy M/W/F, can shower today. ?-awaiting VAC approval by insurance ?- WOCN following for new ostomy and vac ?- Mobilize, Pulm toilet ?-surgically stable for DC home today if [facility] approved and delivered. ?-follow up arranged and medications sent to pharmacy ? ?FEN - soft diet ?VTE - SCDs, Lovenox. ?ID - Cipro/Flagyl 2/17-21, Zosyn 2/21 >> 2/26 ?Foley  -none ? ?- Per [facility] - ?Hyperkalemia -improved ?AKI - 0.64 ?E. Coli UTI ?Hx CAD s/p DES 2021 - can likely resume plavix at this point ?HTN ?HLD ?OSA ?Hx CHF ?Strained chest muscle - muscle relaxer and add some toradol for pain relief.  May use ice or heat as needed as well ? ? ? LOS: 12 days  ? ? ?Valerie Fields , MD ?[facility] ?05/29/2021, 10:35 AM ?Please see Amion for floor coverage during day hours 7:00am-4:30pm ? ?

## 2021-05-29 NOTE — Progress Notes (Signed)
[facility] ?PROGRESS NOTE ? ? ? ?Progress Note  ?Valerie Fields  WIO:[id number] DOB: September 23, 1950 DOA: 05/17/2021 ?PCP: Marda Stalker, PA-C  ? ? ? ?Brief Narrative:  ? ?Valerie Fields is an 71 y.o. female past medical history coronary artery disease with a history of NSTEMI cath in 2021 that showed a single-vessel disease and high-grade LAD stenosis status post drug-eluting stents, combined systolic and diastolic heart failure with an EF of 60% by 2D echo on 04/24/2021, obstructive sleep apnea presents to the ER from the GI office due to multiple episodes of abdominal pain and bloating there was a concern for acute diverticulitis flare patient started having symptoms about 10 days prior to admission started on Augmentin for UTI saw GI in February 2016 for abdominal pain started on demeclocycline in the ED was seen by GI and general surgery had a flex sig, underwent Perative management in 05/20/2021 with a right colectomy and loop sigmoid colostomy this had to be doing openly due to distal colonic stricture in the pelvis and had a perforated cecum for which she developed feculent peritonitis at the time of surgery.She was started on IV antibiotics Cipro and Flagyl on 05/17/2021, transition to IV Zosyn on 05/21/2021 as she is at high risk of developing abscess. ? ? ?Assessment/Plan:  ? ?High-grade benign distal sigmoid stricture with perforation probable diverticular stricture of the sigmoid/Status post exploratory laparotomy with right hemicolectomy, loop sigmoid colostomy and a perforated cecum on 05/20/2021: ?Continue to hold Plavix, surgery to dictate when to restart. ?She completed 7-day course of antibiotics. ?doing well with physical therapy, she will go home with home health.   ?Surgery rec negative pressure wound therapy Monday Wednesday and Friday. ?Transition her to regular diet. ? ?Acute kidney injury: ?Likely prerenal azotemia, in the setting of ACE inhibitor. ?Her creatinine has returned to  baseline. ? ?Leukocytosis: ?Likely due to infectious etiology, resolved. ? ?Chronic combined systolic and diastolic heart failure: ?Continue beta-blockers,. ?Appears to be euvolemic. ? ?History of coronary artery disease with a DES stent to the LAD: ?Holding Plavix surgery to dictate when to restart. ? ?Essential hypertension: ?Continue to hold losartan blood pressure stable tachycardia improved with IV metoprolol. ? ?Hypokalemia: ?Replete orally now resolved try to keep closer to 4 ? ?Morbid obesity: ?Counseling ? ?DVT prophylaxis: scd's ?Family Communication:sister ?Status is: Inpatient ?Remains inpatient appropriate because: Perforated colon ? ? ? ? ?Code Status:  ? ?  ?Code Status Orders  ?(From admission, onward)  ?  ? ? ?  ? ?  Start     Ordered  ? 05/18/21 0729  Full code  Continuous       ? 05/18/21 0729  ? ?  ?  ? ?  ? ?Code Status History   ? ? Date Active Date Inactive Code Status Order ID Comments User Context  ? 05/17/2021 2255 05/18/2021 0729 Full Code [id number]  Kristopher Oppenheim, DO ED  ? 10/13/2019 1348 10/15/2019 1603 Full Code [id number]  Nelva Bush, MD Inpatient  ? ?  ? ?Advance Directive Documentation   ? ?Flowsheet Row Most Recent Value  ?Type of Advance Directive Healthcare Power of [place], Living will  ?Pre-existing out of facility DNR order (yellow form or pink MOST form) --  ?"MOST" Form in Place? --  ? ?  ? ? ? ? ?IV Access:  ? ?Peripheral IV ? ? ?Procedures and diagnostic studies:  ? ?No results found. ? ? ?Medical Consultants:  ? ?None. ? ? ?Subjective:  ? ? ?Valerie Fields  tolerating her diet been out of the been nauseated. ?Objective:  ? ? ?Vitals:  ? 05/28/21 0543 05/28/21 1251 05/28/21 2127 05/29/21 0554  ?BP: (!) 150/77 134/72 134/61 135/69  ?Pulse: 94 90 88 89  ?Resp: 18 16 15    ?Temp: 99.3 ?F (37.4 ?C) 98 ?F (36.7 ?C) 99.2 ?F (37.3 ?C) 98.8 ?F (37.1 ?C)  ?TempSrc: Oral  Oral Oral  ?SpO2: 93% 96% 96% 96%  ?Weight:      ?Height:      ? ?SpO2: 96 % ?O2 Flow Rate (L/min): 2  L/min ? ? ?Intake/Output Summary (Last 24 hours) at 05/29/2021 1112 ?Last data filed at 05/29/2021 0600 ?Gross per 24 hour  ?Intake 840 ml  ?Output 3105 ml  ?Net -2265 ml  ? ? ?Filed Weights  ? 05/25/21 0502 05/26/21 0518 05/27/21 0500  ?Weight: 100.4 kg 100.4 kg 100.4 kg  ? ? ?Exam: ?General exam: In no acute distress. ?Respiratory system: Good air movement and clear to auscultation. ?Cardiovascular system: S1 & S2 heard, RRR. No JVD. ?Gastrointestinal system: Abdomen is is nondistended nontender with wound VAC in place. ?Extremities: No pedal edema. ?Skin: No rashes, lesions or ulcers ?Psychiatry: Judgement and insight appear normal. Mood & affect appropriate. ? ? ?Data Reviewed:  ? ? ?Labs: ?Basic Metabolic Panel: ?Recent Labs  ?Lab 05/23/21 ?0429 05/24/21 ?0430 05/25/21 ?1000 05/26/21 ?0432 05/27/21 ?0410  ?NA 133* 137 134* 133* 134*  ?K 3.6 3.3* 3.0* 3.1* 3.6  ?CL 108 110 103 104 107  ?CO2 20* 22 23 22 23   ?GLUCOSE 114* 85 119* 91 119*  ?BUN 20 19 12 9  7*  ?CREATININE 1.23* 0.89 0.58 0.65 0.64  ?CALCIUM 8.2* 8.6* 8.5* 8.4* 8.3*  ?MG 2.4 2.3  --   --   --   ? ? ?GFR ?Estimated Creatinine Clearance: 73.6 mL/min (by C-G formula based on SCr of 0.64 mg/dL). ?Liver Function Tests: ?No results for input(s): AST, ALT, ALKPHOS, BILITOT, PROT, ALBUMIN in the last 168 hours. ? ?No results for input(s): LIPASE, AMYLASE in the last 168 hours. ? ?No results for input(s): AMMONIA in the last 168 hours. ?Coagulation profile ?No results for input(s): INR, PROTIME in the last 168 hours. ?COVID-19 Labs ? ?No results for input(s): DDIMER, FERRITIN, LDH, CRP in the last 72 hours. ? ?Lab Results  ?Component Value Date  ? [place] NEGATIVE 05/17/2021  ? [place] Not Detected 12/09/2019  ? [place] NEGATIVE 10/13/2019  ? ? ?CBC: ?Recent Labs  ?Lab 05/24/21 ?0430  ?WBC 11.7*  ?HGB 12.7  ?HCT 38.4  ?MCV 96.5  ?PLT 293  ? ? ?Cardiac Enzymes: ?No results for input(s): CKTOTAL, CKMB, CKMBINDEX, TROPONINI in the last 168 hours. ?BNP  (last 3 results) ?No results for input(s): PROBNP in the last 8760 hours. ?CBG: ?No results for input(s): GLUCAP in the last 168 hours. ?D-Dimer: ?No results for input(s): DDIMER in the last 72 hours. ?Hgb A1c: ?No results for input(s): HGBA1C in the last 72 hours. ?Lipid Profile: ?No results for input(s): CHOL, HDL, LDLCALC, TRIG, CHOLHDL, LDLDIRECT in the last 72 hours. ?Thyroid function studies: ?No results for input(s): TSH, T4TOTAL, T3FREE, THYROIDAB in the last 72 hours. ? ?Invalid input(s): FREET3 ?Anemia work up: ?No results for input(s): VITAMINB12, FOLATE, FERRITIN, TIBC, IRON, RETICCTPCT in the last 72 hours. ?Sepsis Labs: ?Recent Labs  ?Lab 05/24/21 ?0430  ?WBC 11.7*  ? ? ?Microbiology ?No results found for this or any previous visit (from the past 240 hour(s)). ? ? ? ?Medications:  ? ? acetaminophen  650 mg Oral Q6H  ? atorvastatin  80 mg Oral Daily  ? Chlorhexidine Gluconate Cloth  6 each Topical Daily  ? enoxaparin (LOVENOX) injection  40 mg Subcutaneous Q24H  ? metoprolol tartrate  2.5 mg Intravenous Q6H  ? montelukast  10 mg Oral QHS  ? pantoprazole  40 mg Oral BID  ? ?Continuous Infusions: ? lactated ringers 10 mL/hr at 05/20/21 1226  ? ? ? ? LOS: 12 days  ? ?Tammi Klippel Aileen Fass  ?[facility] ? ?05/29/2021, 11:12 AM  ? ? ? ? ? ? ?

## 2021-05-29 NOTE — Progress Notes (Signed)
Physical Therapy Treatment ?Patient Details ?Name: Valerie Fields ?MRN: [id number] ?DOB: 02/23/1951 ?Today's Date: 05/29/2021 ? ? ?History of Present Illness 71 yo female admitted with colonic obstruction. S/P hemicolectomy, colostomy, wound vac placement 2/22. Hx of NSTEMI, CAD, HF, OSA ? ?  ?PT Comments  ? ? Pt is anxious about d/c home tomorrow but feeling more confident overall. Pt mod I to supervision and amb long household distances. Continue to recommend HHPT   ?Recommendations for follow up therapy are one component of a multi-disciplinary discharge planning process, led by the attending physician.  Recommendations may be updated based on patient status, additional functional criteria and insurance authorization. ? ?Follow Up Recommendations ? Home health PT ?  ?  ?Assistance Recommended at Discharge PRN  ?Patient can return home with the following A little help with walking and/or transfers;A little help with bathing/dressing/bathroom ?  ?Equipment Recommendations ? Rolling walker (2 wheels)  ?  ?Recommendations for Other Services   ? ? ?  ?Precautions / Restrictions Precautions ?Precautions: Fall ?Precaution Comments: L colostomy, Wound Vac ?Restrictions ?Weight Bearing Restrictions: No  ?  ? ?Mobility ? Bed Mobility ?Overal bed mobility: Modified Independent ?Bed Mobility: Rolling, Sidelying to Sit ?  ?  ?  ?  ?  ?General bed mobility comments: reviewed log roll technique, light use of rail. disucssed home and elevating head of adjustable bed rather than manual recliner as lever will likely more force than she is supposed to exert ?  ? ?Transfers ?Overall transfer level: Needs assistance ?Equipment used: None ?Transfers: Sit to/from Stand ?Sit to Stand: Modified independent (Device/Increase time) ?  ?  ?  ?  ?  ?  ?  ? ?Ambulation/Gait ?Ambulation/Gait assistance: Supervision ?Gait Distance (Feet): 160 Feet ?Assistive device: Rolling walker (2 wheels) ?Gait Pattern/deviations: Step-through pattern ?  ?   ?  ?General Gait Details: slow but steady with RW, improved trunk extension ? ? ?Stairs ?  ?  ?  ?  ?  ? ? ?Wheelchair Mobility ?  ? ?Modified Rankin (Stroke Patients Only) ?  ? ? ?  ?Balance   ?  ?Sitting balance-Leahy Scale: Good ?  ?  ?Standing balance support: No upper extremity supported ?Standing balance-Leahy Scale: Fair ?  ?  ?  ?  ?  ?  ?  ?  ?  ?  ?  ?  ?  ? ?  ?Cognition Arousal/Alertness: Awake/alert ?Behavior During Therapy: [facility] for tasks assessed/performed ?Overall Cognitive Status: Within Functional Limits for tasks assessed ?  ?  ?  ?  ?  ?  ?  ?  ?  ?  ?  ?  ?  ?  ?  ?  ?  ?  ?  ? ?  ?Exercises   ? ?  ?General Comments   ?  ?  ? ?Pertinent Vitals/Pain Pain Assessment ?Pain Assessment: 0-10 ?Pain Score: 3  ?Pain Descriptors / Indicators: Discomfort, Sore ?Pain Intervention(s): Limited activity within patient's tolerance, Monitored during session, Premedicated before session  ? ? ?Home Living   ?  ?  ?  ?  ?  ?  ?  ?  ?  ?   ?  ?Prior Function    ?  ?  ?   ? ?PT Goals (current goals can now be found in the care plan section) Acute Rehab PT Goals ?Patient Stated Goal: regain PLOF/independence ?PT Goal Formulation: With patient ?Time For Goal Achievement: 06/05/21 ?Potential to Achieve Goals: Good ?Progress towards PT goals:  Progressing toward goals ? ?  ?Frequency ? ? ? Min 3X/week ? ? ? ?  ?PT Plan Current plan remains appropriate  ? ? ?Co-evaluation   ?  ?  ?  ?  ? ?  ?AM-PAC PT "6 Clicks" Mobility   ?Outcome Measure ? Help needed turning from your back to your side while in a flat bed without using bedrails?: A Little ?Help needed moving from lying on your back to sitting on the side of a flat bed without using bedrails?: A Little ?Help needed moving to and from a bed to a chair (including a wheelchair)?: A Little ?Help needed standing up from a chair using your arms (e.g., wheelchair or bedside chair)?: A Little ?Help needed to walk in hospital room?: A Little ?Help needed climbing 3-5 steps with  a railing? : A Little ?6 Click Score: 18 ? ?  ?End of Session Equipment Utilized During Treatment: Gait belt ?Activity Tolerance: Patient tolerated treatment well ?Patient left: with call bell/phone within reach;in bed;with bed alarm set ?Nurse Communication: Mobility status ?PT Visit Diagnosis: Difficulty in walking, not elsewhere classified (R26.2) ?  ? ? ?Time: 1440-1505 ?PT Time Calculation (min) (ACUTE ONLY): 25 min ? ?Charges:  $Gait Training: 8-22 mins ?$Therapeutic Activity: 8-22 mins          ?          ? Baxter Flattery, PT ? ?Acute Rehab Dept [facility]) [phone] ?Pager [phone] ? ?05/29/2021 ? ? ? ?Jermon Chalfant ?05/29/2021, 3:15 PM ? ?

## 2021-05-29 NOTE — Discharge Instructions (Signed)
[facility]      [facility] [phone]  OPEN ABDOMINAL SURGERY: POST OP INSTRUCTIONS  Always review your discharge instruction sheet given to you by the facility where your surgery was performed.  IF YOU HAVE DISABILITY OR FAMILY LEAVE FORMS, YOU MUST BRING THEM TO THE OFFICE FOR PROCESSING.  PLEASE DO NOT GIVE THEM TO YOUR DOCTOR.  A prescription for pain medication may be given to you upon discharge.  Take your pain medication as prescribed, if needed.  If narcotic pain medicine is not needed, then you may take acetaminophen (Tylenol) or ibuprofen (Advil) as needed. Take your usually prescribed medications unless otherwise directed. If you need a refill on your pain medication, please contact your pharmacy. They will contact our office to request authorization.  Prescriptions will not be filled after 5pm or on week-ends. You should follow a light diet the first few days after arrival home, such as soup and crackers, pudding, etc.unless your doctor has advised otherwise. A high-fiber, low fat diet can be resumed as tolerated.   Be sure to include lots of fluids daily. Most patients will experience some swelling and bruising on the chest and neck area.  Ice packs will help.  Swelling and bruising can take several days to resolve Most patients will experience some swelling and bruising in the area of the incision. Ice pack will help. Swelling and bruising can take several days to resolve..  It is common to experience some constipation if taking pain medication after surgery.  Increasing fluid intake and taking a stool softener will usually help or prevent this problem from occurring.  A mild laxative (Milk of Magnesia or Miralax) should be taken according to package directions if there are no bowel movements after 48 hours.  You may have steri-strips (small skin tapes) in place directly over the incision.  These strips should be left on the skin for 7-10 days.  If your surgeon used skin  glue on the incision, you may shower in 24 hours.  The glue will flake off over the next 2-3 weeks.  Any sutures or staples will be removed at the office during your follow-up visit. You may find that a light gauze bandage over your incision may keep your staples from being rubbed or pulled. You may shower and replace the bandage daily. ACTIVITIES:  You may resume regular (light) daily activities beginning the next day--such as daily self-care, walking, climbing stairs--gradually increasing activities as tolerated.  You may have sexual intercourse when it is comfortable.  Refrain from any heavy lifting or straining until approved by your doctor. You may drive when you no longer are taking prescription pain medication, you can comfortably wear a seatbelt, and you can safely maneuver your car and apply brakes Return to Work: ___________________________________ You should see your doctor in the office for a follow-up appointment approximately two weeks after your surgery.  Make sure that you call for this appointment within a day or two after you arrive home to insure a convenient appointment time. OTHER INSTRUCTIONS:  _____________________________________________________________ _____________________________________________________________  WHEN TO CALL YOUR DOCTOR: Fever over 101.0 Inability to urinate Nausea and/or vomiting Extreme swelling or bruising Continued bleeding from incision. Increased pain, redness, or drainage from the incision. Difficulty swallowing or breathing Muscle cramping or spasms. Numbness or tingling in hands or feet or around lips.  The clinic staff is available to answer your questions during regular business hours.  Please don't hesitate to call and ask to speak to one of   the nurses if you have concerns.  For further questions, please visit [email or web address]  

## 2021-05-29 NOTE — Consult Note (Addendum)
[place] Nurse ostomy follow up ?Stoma type/location: LLQ loop colostomy ?Stomal assessment/size: 2 inch oval side to side, 1 and 1/2 inch from top to bottom, stoma is below the surface of the skin  ?Peristomal assessment: intact, mild erythema irritant contact dermatitis due to stomal effluent at 9 o'clock ?Treatment options for stomal/peristomal skin: two skin barrier rings, convex pouch ?Output: thickening seedy stool ?Ostomy pouching: 1pc.convex with two skin barrier rings. A belt is provided and attached today for extra pressure on the 3 and 9 o'clock areas of depression. ?Education provided: Patient is emptying independently, we again discuss change frequency and she will discuss with her HHRN to see if they wish to change M/W/F with NPWT changes or Monday/Thursday ?Enrolled patient in [place] Discharge program: Yes, previously.  Patient reports that box is at home  ? ? ?[place] Nurse wound follow up ?Wound type:Surgical ?Measurement:per Monday ?Wound JOI:NOMVE red, moist ?Drainage (amount, consistency, odor) serous ?Periwound:intact ?Dressing procedure/placement/frequency: Patient has showered after Nursing removed previous dressing. Two (2) pieces of black foam inserted into wound, covered with drape. 114mmHg continuous negative pressure applied and patient tolerated procedure well. ? ?Patient is ready for discharge. Supplies in room for home. ?  ? ? ?[place] nursing team will follow, and will remain available to this patient, the nursing and medical teams.  ?Thanks, ?Maudie Flakes, MSN, RN, [place], [place], CWON-AP, [place]  ?Pager# [phone]  ? ?  ?

## 2021-05-29 NOTE — TOC Progression Note (Signed)
Transition of Care (TOC) - Progression Note  ? ?Patient Details  ?Name: Emnet Monk ?MRN: [id number] ?Date of Birth: Nov 01, 1950 ? ?Transition of Care (TOC) CM/SW Contact  ?Sherie Don, LCSW ?Phone Number: ?05/29/2021, 12:44 PM ? ?Clinical Narrative: [place] orders are in for RN, PT, and OT. CSW updated Levada Dy with [facility]. CSW provided patient with [employer] proof of delivery form to sign that was sent back to [place] with [employer]. ? ?Expected Discharge Plan: [place] ?Barriers to Discharge: Continued Medical Work up ? ?Expected Discharge Plan and Services ?Expected Discharge Plan: [place] ?In-house Referral: Clinical Social Work ?Discharge Planning Services: CM Consult ?Post Acute Care Choice: Home Health ?Living arrangements for the past 2 months: [place]             ?DME Arranged: N/A ?DME Agency: NA ?HH Arranged: RN ?[place] Agency: [place] ?Date HH Agency Contacted: 05/21/21 ?Representative spoke with at [place]: Levada Dy ? ?Readmission Risk Interventions ?No flowsheet data found. ? ?

## 2021-05-30 MED ORDER — LOSARTAN POTASSIUM 25 MG PO TABS: 25.0000 mg | ORAL_TABLET | Freq: Every morning | ORAL | 1 refills | Status: DC

## 2021-05-30 MED ORDER — LIDOCAINE 5 % EX PTCH: 1.0000 | MEDICATED_PATCH | Freq: Two times a day (BID) | CUTANEOUS | 0 refills | Status: DC

## 2021-05-30 MED ORDER — LIDOCAINE 5 % EX PTCH
1.0000 | MEDICATED_PATCH | CUTANEOUS | Status: DC
Start: 2021-05-30 — End: 2021-05-30
  Administered 2021-05-30: 1 via TRANSDERMAL
  Filled 2021-05-30: qty 1

## 2021-05-30 NOTE — Discharge Summary (Addendum)
Physician Discharge Summary  Valerie Fields OBS:[id number] DOB: May 06, 1950 DOA: 05/17/2021  PCP: Marda Stalker, PA-C  Admit date: 05/17/2021 Discharge date: 05/30/2021  Admitted From: Home Disposition:  Home  Recommendations for Outpatient Follow-up:  Follow up with Surgery in 1-2 weeks Please obtain BMP/CBC in one week Home health to follow-up with wound VAC and physical therapy  Home Health:yes Equipment/Devices:wound vac  Discharge Condition:Stable CODE STATUS:Full Diet recommendation: Heart Healthy   Brief/Interim Summary:  71 y.o. female past medical history coronary artery disease with a history of NSTEMI cath in 2021 that showed a single-vessel disease and high-grade LAD stenosis status post drug-eluting stents, combined systolic and diastolic heart failure with an EF of 60% by 2D echo on 04/24/2021, obstructive sleep apnea presents to the ER from the GI office due to multiple episodes of abdominal pain and bloating there was a concern for acute diverticulitis flare patient started having symptoms about 10 days prior to admission started on Augmentin for UTI saw GI in February 2016 for abdominal pain started on demeclocycline in the ED was seen by GI and general surgery had a flex sig, underwent Perative management in 05/20/2021 with a right colectomy and loop sigmoid colostomy this had to be doing openly due to distal colonic stricture in the pelvis and had a perforated cecum for which she developed feculent peritonitis at the time of surgery.She was started on IV antibiotics Cipro and Flagyl on 05/17/2021, transition to IV Zosyn on 05/21/2021 as she is at high risk of developing abscess.    Discharge Diagnoses:  Principal Problem:   Colonic obstruction ([place]) Active Problems:   Perforation of cecum   Chronic combined systolic and diastolic CHF (congestive heart failure) (HCC)   Hyperlipidemia   GERD (gastroesophageal reflux disease)   OSA (obstructive sleep apnea)    Primary hypertension   CAD (coronary artery disease), native coronary artery   Hypokalemia   Stricture of sigmoid colon (HCC)   S/P exploratory laparotomy   Large bowel obstruction (HCC)  Complete high-grade benign distal sigmoid stricture with perforation probable diverticular stricture of the colon/status post exploratory laparotomy with right hemicolectomy sigmoidoscopy and perforation of the cecum done on 05/20/2021: Surgery was consulted to perform exploratory laparotomy on 05/20/2021. NG tube was discontinued she was tolerating her diet. She was started empirically on IV antibiotics blood cultures remain negative and  She completed her course of antibiotics in house. Electrolytes were repleted as needed. Wound VAC was placed during surgery which she will follow-up as an outpatient with general surgery. Home health to follow-up with wound VAC at home.  Acute kidney injury: Likely being on cefepime in the setting of ACE inhibitor she auto diuresis her creatinine returned to baseline. She will resume her ACE inhibitor in 2 weeks.  Leukocytosis: Likely due to infectious etiology now resolved.  Chronic combined systolic and diastolic heart failure: With a last 2D echo on 2021 that showed a preserved EF with grade 2 diastolic heart failure. No changes were made to her medication except for her ACE inhibitor which she will start in 2 weeks.  History of coronary artery disease with DES stent to the LAD: Resume Plavix as an outpatient.  Essential hypertension: Continue beta-blockers, she will resume her losartan as an outpatient.  Hypokalemia: Was repleted orally now resolved.  Morbid obesity: Counseling.  Rib cage and back pain: Likely musculoskeletal she relates feels like a pulled muscle is reproducible by palpation she relates it hurts every time she tries to sit up or get  up. Lidocaine patch and narcotics are helping she will go home on lidocaine patch.   Discharge  Instructions  Discharge Instructions     Amb Referral to Ostomy Clinic   Complete by: As directed    Retracted loop colostomy   Reason for referral modifiers: Consultation for problems such as pouch leaking or skin irritation   Diet - low sodium heart healthy   Complete by: As directed    Increase activity slowly   Complete by: As directed    No wound care   Complete by: As directed       Allergies as of 05/30/2021       Reactions   Sulfa Antibiotics Other (See Comments)   As a child, turned red above neck        Medication List     STOP taking these medications    ALLERGY RELIEF PO   metroNIDAZOLE 0.75 % gel Commonly known as: METROGEL   TYLENOL ARTHRITIS PAIN PO Replaced by: acetaminophen 500 MG tablet       TAKE these medications    acetaminophen 500 MG tablet Commonly known as: TYLENOL Take 2 tablets (1,000 mg total) by mouth every 6 (six) hours as needed. Replaces: TYLENOL ARTHRITIS PAIN PO   atorvastatin 80 MG tablet Commonly known as: LIPITOR TAKE ONE TABLET BY MOUTH EVERYDAY AT BEDTIME What changed: See the new instructions.   azelastine 0.1 % nasal spray Commonly known as: ASTELIN Place 1 spray into both nostrils daily. Use in each nostril as directed   clopidogrel 75 MG tablet Commonly known as: PLAVIX Take 1 tablet (75 mg total) by mouth daily.   dicyclomine 10 MG capsule Commonly known as: BENTYL Take 1 capsule (10 mg total) by mouth 4 (four) times daily -  before meals and at bedtime. What changed:  when to take this reasons to take this   docusate sodium 100 MG capsule Commonly known as: COLACE Take 100 mg by mouth daily as needed for mild constipation.   lidocaine 5 % Commonly known as: Lidoderm Place 1 patch onto the skin every 12 (twelve) hours. Remove & Discard patch within 12 hours or as directed by MD   losartan 25 MG tablet Commonly known as: COZAAR Take 1 tablet (25 mg total) by mouth every morning. Start taking on:  June 13, 2021 What changed: These instructions start on June 13, 2021. If you are unsure what to do until then, ask your doctor or other care provider.   methocarbamol 500 MG tablet Commonly known as: ROBAXIN Take 1 tablet (500 mg total) by mouth every 6 (six) hours as needed for muscle spasms.   metoprolol succinate 25 MG 24 hr tablet Commonly known as: TOPROL-XL TAKE ONE TABLET BY MOUTH EVERY MORNING   montelukast 10 MG tablet Commonly known as: SINGULAIR Take 10 mg by mouth at bedtime.   nitroGLYCERIN 0.4 MG SL tablet Commonly known as: NITROSTAT Place 1 tablet (0.4 mg total) under the tongue every 5 (five) minutes as needed for chest pain.   oxyCODONE 5 MG immediate release tablet Commonly known as: Oxy IR/ROXICODONE Take 1-2 tablets (5-10 mg total) by mouth every 6 (six) hours as needed for moderate pain.   pantoprazole 40 MG tablet Commonly known as: PROTONIX Take 1 tablet (40 mg total) by mouth 2 (two) times daily before a meal. What changed:  when to take this reasons to take this   psyllium 0.52 g capsule Commonly known as: REGULOID Take 1.5 g by mouth  daily.   Sleep Aid (DiphenhydrAMINE) 25 MG tablet Generic drug: diphenhydrAMINE Take 25 mg by mouth at bedtime as needed for sleep.               Durable Medical Equipment  (From admission, onward)           Start     Ordered   05/21/21 1029  For home use only DME Negative pressure wound device  Once       Question Answer Comment  Frequency of dressing change 3 times per week   Length of need 3 Months   Dressing type Foam   Amount of suction 125 mm/Hg   Pressure application Continuous pressure   Supplies 10 canisters and 15 dressings per month for duration of therapy      05/21/21 1028            Follow-up [facility] Follow up.   Why: [facility] will provide nursing for ostomy care in the home.        Clovis Riley, MD Follow up on 06/21/2021.    Specialty: General Surgery Why: 10:50 am, Arrive 30 minutes prior to your appointment for paperwork and check in process Contact information: [address]  [place] [address] [phone]                Allergies  Allergen Reactions   Sulfa Antibiotics Other (See Comments)    As a child, turned red above neck    Consultations: General surgery   Procedures/Studies: DG Abd 1 View  Result Date: 05/18/2021 CLINICAL DATA:  71 year old female with distal colonic obstruction. EXAM: ABDOMEN - 1 VIEW COMPARISON:  CT Abdomen and Pelvis 05/17/2021. FINDINGS: Portable AP supine view at 0747 hours. Ongoing moderate to severe dilated large bowel to the pelvis. Comparison CT yesterday with appearance suspicious for apple-core lesion at the rectosigmoid junction. No pneumoperitoneum identified on the supine views. Grossly negative lung bases. No acute osseous abnormality identified. IMPRESSION: High-grade distal large bowel obstruction with CT appearance suspicious for rectosigmoid junction apple-core lesion. Distal Colon Carcinoma until proven otherwise. Recommended Sigmoidoscopy. Electronically Signed   By: Genevie Ann M.D.   On: 05/18/2021 08:21   CT Abdomen Pelvis W Contrast  Result Date: 05/17/2021 CLINICAL DATA:  Bowel obstruction suspected Diverticulitis, complication suspected EXAM: CT ABDOMEN AND PELVIS WITH CONTRAST TECHNIQUE: Multidetector CT imaging of the abdomen and pelvis was performed using the standard protocol following bolus administration of intravenous contrast. RADIATION DOSE REDUCTION: This exam was performed according to the departmental dose-optimization program which includes automated exposure control, adjustment of the mA and/or kV according to patient size and/or use of iterative reconstruction technique. CONTRAST:  159mL OMNIPAQUE IOHEXOL 300 MG/ML  SOLN COMPARISON:  Radiograph 05/16/2021 FINDINGS: Lower chest: LAD stent or calcifications. Mitral  annular calcifications. Apical and apical septal endocardial hypoattenuation compatible with prior infarct. No acute abnormality. Hepatobiliary: No focal liver abnormality is seen. The gallbladder is unremarkable. Pancreas: Unremarkable. No pancreatic ductal dilatation or surrounding inflammatory changes. Spleen: Normal in size without focal abnormality. Adrenals/Urinary Tract: There is a 2.5 cm left adrenal nodule, previously measuring 2.3 cm (series 2, image 28). On previous exam in 2020, this measured less than 10 Hounsfield units, consistent with adenoma (this was a noncontrast exam). No hydronephrosis or nephrolithiasis. Left renal sinus cysts. The bladder is decompressed. Stomach/Bowel: There is near diffuse fluid-filled dilatation of the colon with air-fluid levels. There are scattered diverticula without definite diverticulitis. There  is some inflammatory stranding along the ascending colon. Decompression at the rectosigmoid junction (series 2, image 78).The appendix is fluid-filled without any adjacent inflammatory stranding. No evidence of appendiceal obstruction/appendicitis. Vascular/Lymphatic: No significant vascular findings are present. No enlarged abdominal or pelvic lymph nodes. Reproductive: Unremarkable. Other: Small volume free fluid in the pelvis, likely reactive. No hernia. Musculoskeletal: No acute osseous abnormality. No suspicious lytic or blastic lesions. Multilevel degenerative changes of the spine with trace retrolisthesis at L5-S1. IMPRESSION: Near diffuse fluid-filled dilatation of the colon with air-fluid levels and mild inflammatory stranding along the ascending colon. Findings are consistent severe diarrheal illness with reactive stranding along the ascending colon/developing colitis. There is a transition to decompressed colon at the rectosigmoid junction, distal obstruction cannot be excluded. 2.5 cm left adrenal nodule, minimally increased in size since prior exam in 2020  (previously measured 2.3 cm), imaging characteristics most consistent with adenoma. Recommend biochemical testing to assess for functionality. Electronically Signed   By: Maurine Simmering M.D.   On: 05/17/2021 11:35   DG CHEST PORT 1 VIEW  Result Date: 05/23/2021 CLINICAL DATA:  Dyspnea R06.00 (ICD-10-CM) EXAM: PORTABLE CHEST 1 VIEW COMPARISON:  10/13/2019. FINDINGS: Low lung volumes with mild bibasilar opacities. No visible pleural effusions or pneumothorax. Cardiac silhouette is accentuated by low lung volumes and AP technique. Gastric tube courses below the diaphragm with the tip outside the field of view. IMPRESSION: Low lung volumes with mild bibasilar opacities, which could represent atelectasis, aspiration, and/or pneumonia. Dedicated PA and lateral radiographs could further characterize if clinically indicated. Electronically Signed   By: Margaretha Sheffield M.D.   On: 05/23/2021 11:22   DG Abd 2 Views  Result Date: 05/16/2021 CLINICAL DATA:  Abdominal pain. Gas. Bloating. Nausea and diarrhea for 10 days. EXAM: ABDOMEN - 2 VIEW COMPARISON:  02/03/2019 CT FINDINGS: Upright view of the abdomen and upper pelvis. Supine views of the abdomen and pelvis. The upright view demonstrates no free intraperitoneal air. Fluid levels throughout the colon. Small bowel air-fluid levels are also suspected within the right mid and lower abdomen. Supine images demonstrate mild gaseous distention of the transverse colon at up to 8.9 cm. No gaseous distention of small bowel loops. Paucity of distal gas. Presumed phleboliths in the left hemipelvis. No abnormal abdominal calcifications. Minimal convex right lumbar spine curvature. IMPRESSION: Mild gaseous distension of the colon with colonic and less so small-bowel air-fluid levels. Nonspecific, possibly related to adynamic ileus in the setting of diarrhea and viral gastroenteritis. Distal obstruction could look similar. Electronically Signed   By: Abigail Miyamoto M.D.   On:  05/16/2021 15:27   DG Abd Acute W/Chest  Result Date: 05/17/2021 CLINICAL DATA:  Abdominal pain, nausea EXAM: DG ABDOMEN ACUTE WITH 1 VIEW CHEST COMPARISON:  05/17/2021 FINDINGS: Supine and upright frontal views of the abdomen as well as an upright frontal view of the chest are obtained. The cardiac silhouette is unremarkable. The lungs are clear. There is diffuse gaseous distention of the colon with multiple gas fluid levels, unchanged since earlier CT. Please refer to preceding CT evaluation describing wall thickening at the rectosigmoid junction with differential of colitis, scarring, or mass. No free gas in the greater peritoneal sac. No masses or abnormal calcifications. IMPRESSION: 1. Stable marked colonic distension and gas fluid levels. Please refer to preceding CT describing wall thickening and transition point at the rectosigmoid junction. 2. No acute intrathoracic process. Electronically Signed   By: Randa Ngo M.D.   On: 05/17/2021 21:35   (Echo, Carotid,  EGD, Colonoscopy, ERCP)    Subjective: No complaints  Discharge Exam: Vitals:   05/29/21 2145 05/30/21 0519  BP: 136/64 135/64  Pulse: (!) 101 93  Resp: 18 18  Temp: 99.1 F (37.3 C) 98.6 F (37 C)  SpO2: 96% 95%   Vitals:   05/29/21 0554 05/29/21 1315 05/29/21 2145 05/30/21 0519  BP: 135/69 127/68 136/64 135/64  Pulse: 89 97 (!) 101 93  Resp:  16 18 18   Temp: 98.8 F (37.1 C) 98 F (36.7 C) 99.1 F (37.3 C) 98.6 F (37 C)  TempSrc: Oral  Oral Oral  SpO2: 96% 94% 96% 95%  Weight:    92.9 kg  Height:        General: Pt is alert, awake, not in acute distress Cardiovascular: RRR, S1/S2 +, no rubs, no gallops Respiratory: CTA bilaterally, no wheezing, no rhonchi Abdominal: Soft, NT, ND, bowel sounds + Extremities: no edema, no cyanosis    The results of significant diagnostics from this hospitalization (including imaging, microbiology, ancillary and laboratory) are listed below for reference.      Microbiology: No results found for this or any previous visit (from the past 240 hour(s)).   Labs: BNP (last 3 results) No results for input(s): BNP in the last 8760 hours. Basic Metabolic Panel: Recent Labs  Lab 05/24/21 0430 05/25/21 1000 05/26/21 0432 05/27/21 0410  NA 137 134* 133* 134*  K 3.3* 3.0* 3.1* 3.6  CL 110 103 104 107  CO2 22 23 22 23   GLUCOSE 85 119* 91 119*  BUN 19 12 9  7*  CREATININE 0.89 0.58 0.65 0.64  CALCIUM 8.6* 8.5* 8.4* 8.3*  MG 2.3  --   --   --    Liver Function Tests: No results for input(s): AST, ALT, ALKPHOS, BILITOT, PROT, ALBUMIN in the last 168 hours. No results for input(s): LIPASE, AMYLASE in the last 168 hours. No results for input(s): AMMONIA in the last 168 hours. CBC: Recent Labs  Lab 05/24/21 0430  WBC 11.7*  HGB 12.7  HCT 38.4  MCV 96.5  PLT 293   Cardiac Enzymes: No results for input(s): CKTOTAL, CKMB, CKMBINDEX, TROPONINI in the last 168 hours. BNP: Invalid input(s): POCBNP CBG: No results for input(s): GLUCAP in the last 168 hours. D-Dimer No results for input(s): DDIMER in the last 72 hours. Hgb A1c No results for input(s): HGBA1C in the last 72 hours. Lipid Profile No results for input(s): CHOL, HDL, LDLCALC, TRIG, CHOLHDL, LDLDIRECT in the last 72 hours. Thyroid function studies No results for input(s): TSH, T4TOTAL, T3FREE, THYROIDAB in the last 72 hours.  Invalid input(s): FREET3 Anemia work up No results for input(s): VITAMINB12, FOLATE, FERRITIN, TIBC, IRON, RETICCTPCT in the last 72 hours. Urinalysis    Component Value Date/Time   COLORURINE YELLOW 05/17/2021 1632   APPEARANCEUR CLOUDY (A) 05/17/2021 1632   LABSPEC 1.030 05/17/2021 1632   PHURINE 5.0 05/17/2021 1632   GLUCOSEU NEGATIVE 05/17/2021 1632   HGBUR MODERATE (A) 05/17/2021 1632   BILIRUBINUR NEGATIVE 05/17/2021 1632   KETONESUR 5 (A) 05/17/2021 1632   PROTEINUR 30 (A) 05/17/2021 1632   NITRITE NEGATIVE 05/17/2021 1632   LEUKOCYTESUR  NEGATIVE 05/17/2021 1632   Sepsis Labs Invalid input(s): PROCALCITONIN,  WBC,  LACTICIDVEN Microbiology No results found for this or any previous visit (from the past 240 hour(s)).  SIGNED:   Charlynne Cousins, MD  [facility] 05/30/2021, 10:22 AM Pager   If 7PM-7AM, please contact night-coverage [email or web address] Password [email or web address]

## 2021-05-30 NOTE — Progress Notes (Signed)
Patient remains surgically stable for DC home as her [facility] has been arranged and equipment set up.  All of her surgical follow up and prescriptions have been completed. ? ?Valerie Fields ?10:15 AM ?05/30/2021 ? ?

## 2021-06-07 ENCOUNTER — Emergency Department (HOSPITAL_COMMUNITY): Payer: Medicare HMO

## 2021-06-07 ENCOUNTER — Other Ambulatory Visit: Payer: Self-pay

## 2021-06-07 ENCOUNTER — Observation Stay (HOSPITAL_COMMUNITY)
Admission: EM | Admit: 2021-06-07 | Discharge: 2021-06-09 | Disposition: A | Discharge status: Home / Self Care | Payer: Medicare HMO | Attending: Internal Medicine | Admitting: Internal Medicine

## 2021-06-07 ENCOUNTER — Encounter (HOSPITAL_COMMUNITY): Payer: Self-pay | Admitting: Emergency Medicine

## 2021-06-07 ENCOUNTER — Observation Stay (HOSPITAL_BASED_OUTPATIENT_CLINIC_OR_DEPARTMENT_OTHER): Payer: Medicare HMO

## 2021-06-07 DIAGNOSIS — I2510 Atherosclerotic heart disease of native coronary artery without angina pectoris: Secondary | ICD-10-CM | POA: Diagnosis not present

## 2021-06-07 DIAGNOSIS — I5043 Acute on chronic combined systolic (congestive) and diastolic (congestive) heart failure: Secondary | ICD-10-CM | POA: Insufficient documentation

## 2021-06-07 DIAGNOSIS — E785 Hyperlipidemia, unspecified: Secondary | ICD-10-CM | POA: Diagnosis present

## 2021-06-07 DIAGNOSIS — R1011 Right upper quadrant pain: Secondary | ICD-10-CM | POA: Diagnosis present

## 2021-06-07 DIAGNOSIS — J45909 Unspecified asthma, uncomplicated: Secondary | ICD-10-CM | POA: Insufficient documentation

## 2021-06-07 DIAGNOSIS — Z79899 Other long term (current) drug therapy: Secondary | ICD-10-CM | POA: Insufficient documentation

## 2021-06-07 DIAGNOSIS — I10 Essential (primary) hypertension: Secondary | ICD-10-CM | POA: Diagnosis present

## 2021-06-07 DIAGNOSIS — E876 Hypokalemia: Secondary | ICD-10-CM | POA: Diagnosis not present

## 2021-06-07 DIAGNOSIS — K8020 Calculus of gallbladder without cholecystitis without obstruction: Principal | ICD-10-CM | POA: Insufficient documentation

## 2021-06-07 DIAGNOSIS — Z7902 Long term (current) use of antithrombotics/antiplatelets: Secondary | ICD-10-CM | POA: Insufficient documentation

## 2021-06-07 DIAGNOSIS — I110 Hypertensive heart disease with heart failure: Secondary | ICD-10-CM | POA: Diagnosis not present

## 2021-06-07 DIAGNOSIS — Z20822 Contact with and (suspected) exposure to covid-19: Secondary | ICD-10-CM | POA: Diagnosis not present

## 2021-06-07 DIAGNOSIS — Z7982 Long term (current) use of aspirin: Secondary | ICD-10-CM | POA: Diagnosis not present

## 2021-06-07 DIAGNOSIS — I5032 Chronic diastolic (congestive) heart failure: Secondary | ICD-10-CM

## 2021-06-07 DIAGNOSIS — Z955 Presence of coronary angioplasty implant and graft: Secondary | ICD-10-CM | POA: Insufficient documentation

## 2021-06-07 DIAGNOSIS — K219 Gastro-esophageal reflux disease without esophagitis: Secondary | ICD-10-CM | POA: Diagnosis present

## 2021-06-07 LAB — CBC WITH DIFFERENTIAL/PLATELET
Abs Immature Granulocytes: 0.07 10*3/uL (ref 0.00–0.07)
Basophils Absolute: 0.1 10*3/uL (ref 0.0–0.1)
Basophils Relative: 0 %
Eosinophils Absolute: 0.2 10*3/uL (ref 0.0–0.5)
Eosinophils Relative: 2 %
HCT: 35 % — ABNORMAL LOW (ref 36.0–46.0)
Hemoglobin: 11.4 g/dL — ABNORMAL LOW (ref 12.0–15.0)
Immature Granulocytes: 1 %
Lymphocytes Relative: 15 %
Lymphs Abs: 1.8 10*3/uL (ref 0.7–4.0)
MCH: 30.5 pg (ref 26.0–34.0)
MCHC: 32.6 g/dL (ref 30.0–36.0)
MCV: 93.6 fL (ref 80.0–100.0)
Monocytes Absolute: 0.9 10*3/uL (ref 0.1–1.0)
Monocytes Relative: 8 %
Neutro Abs: 8.9 10*3/uL — ABNORMAL HIGH (ref 1.7–7.7)
Neutrophils Relative %: 74 %
Platelets: 735 10*3/uL — ABNORMAL HIGH (ref 150–400)
RBC: 3.74 MIL/uL — ABNORMAL LOW (ref 3.87–5.11)
RDW: 13.4 % (ref 11.5–15.5)
WBC: 11.8 10*3/uL — ABNORMAL HIGH (ref 4.0–10.5)
nRBC: 0 % (ref 0.0–0.2)

## 2021-06-07 LAB — URINALYSIS, ROUTINE W REFLEX MICROSCOPIC
Bacteria, UA: NONE SEEN
Bilirubin Urine: NEGATIVE
Glucose, UA: NEGATIVE mg/dL
Ketones, ur: NEGATIVE mg/dL
Leukocytes,Ua: NEGATIVE
Nitrite: NEGATIVE
Protein, ur: NEGATIVE mg/dL
Specific Gravity, Urine: >1.046 — ABNORMAL HIGH (ref 1.005–1.030)
pH: 7 (ref 5.0–8.0)

## 2021-06-07 LAB — ECHOCARDIOGRAM COMPLETE
AR max vel: 2.11 cm2
AV Area VTI: 2.32 cm2
AV Area mean vel: 2.17 cm2
AV Mean grad: 3 mmHg
AV Peak grad: 5.7 mmHg
Ao pk vel: 1.19 m/s
Area-P 1/2: 2.68 cm2
MV VTI: 1.96 cm2
S' Lateral: 2.5 cm

## 2021-06-07 LAB — LIPASE, BLOOD: Lipase: 72 U/L — ABNORMAL HIGH (ref 11–51)

## 2021-06-07 LAB — COMPREHENSIVE METABOLIC PANEL
ALT: 66 U/L — ABNORMAL HIGH (ref 0–44)
AST: 55 U/L — ABNORMAL HIGH (ref 15–41)
Albumin: 2.3 g/dL — ABNORMAL LOW (ref 3.5–5.0)
Alkaline Phosphatase: 73 U/L (ref 38–126)
Anion gap: 10 (ref 5–15)
BUN: 7 mg/dL — ABNORMAL LOW (ref 8–23)
CO2: 26 mmol/L (ref 22–32)
Calcium: 8.9 mg/dL (ref 8.9–10.3)
Chloride: 101 mmol/L (ref 98–111)
Creatinine, Ser: 0.62 mg/dL (ref 0.44–1.00)
GFR, Estimated: >60 mL/min (ref >60)
Glucose, Bld: 124 mg/dL — ABNORMAL HIGH (ref 70–99)
Potassium: 2.9 mmol/L — ABNORMAL LOW (ref 3.5–5.1)
Sodium: 137 mmol/L (ref 135–145)
Total Bilirubin: 0.3 mg/dL (ref 0.3–1.2)
Total Protein: 7.5 g/dL (ref 6.5–8.1)

## 2021-06-07 LAB — RESP PANEL BY RT-PCR (FLU A&B, COVID) ARPGX2
Influenza A by PCR: NEGATIVE
Influenza B by PCR: NEGATIVE
SARS Coronavirus 2 by RT PCR: NEGATIVE

## 2021-06-07 LAB — I-STAT CHEM 8, ED
BUN: 5 mg/dL — ABNORMAL LOW (ref 8–23)
Calcium, Ion: 1.18 mmol/L (ref 1.15–1.40)
Chloride: 102 mmol/L (ref 98–111)
Creatinine, Ser: 0.6 mg/dL (ref 0.44–1.00)
Glucose, Bld: 125 mg/dL — ABNORMAL HIGH (ref 70–99)
HCT: 34 % — ABNORMAL LOW (ref 36.0–46.0)
Hemoglobin: 11.6 g/dL — ABNORMAL LOW (ref 12.0–15.0)
Potassium: 2.9 mmol/L — ABNORMAL LOW (ref 3.5–5.1)
Sodium: 140 mmol/L (ref 135–145)
TCO2: 27 mmol/L (ref 22–32)

## 2021-06-07 LAB — MAGNESIUM: Magnesium: 1.9 mg/dL (ref 1.7–2.4)

## 2021-06-07 MED ORDER — PROCHLORPERAZINE EDISYLATE 10 MG/2ML IJ SOLN
10.0000 mg | Freq: Four times a day (QID) | INTRAMUSCULAR | Status: DC | PRN
Start: 2021-06-07 — End: 2021-06-09

## 2021-06-07 MED ORDER — METOPROLOL SUCCINATE ER 25 MG PO TB24
25.0000 mg | ORAL_TABLET | Freq: Every day | ORAL | Status: DC
  Administered 2021-06-07 – 2021-06-09 (×3): 25 mg via ORAL
  Filled 2021-06-07 (×3): qty 1

## 2021-06-07 MED ORDER — HYDROMORPHONE HCL 1 MG/ML IJ SOLN
1.0000 mg | INTRAMUSCULAR | Status: DC | PRN
  Administered 2021-06-07: 1 mg via INTRAVENOUS
  Filled 2021-06-07: qty 1

## 2021-06-07 MED ORDER — ACETAMINOPHEN 325 MG PO TABS
650.0000 mg | ORAL_TABLET | Freq: Four times a day (QID) | ORAL | Status: DC | PRN
  Administered 2021-06-07: 650 mg via ORAL
  Filled 2021-06-07: qty 2

## 2021-06-07 MED ORDER — PANTOPRAZOLE SODIUM 40 MG IV SOLR
40.0000 mg | Freq: Once | INTRAVENOUS | Status: AC
  Administered 2021-06-07: 40 mg via INTRAVENOUS
  Filled 2021-06-07: qty 10

## 2021-06-07 MED ORDER — ACETAMINOPHEN 650 MG RE SUPP: 650.0000 mg | Freq: Four times a day (QID) | RECTAL | Status: DC | PRN

## 2021-06-07 MED ORDER — ENSURE ENLIVE PO LIQD
237.0000 mL | Freq: Two times a day (BID) | ORAL | Status: DC
  Administered 2021-06-08 – 2021-06-09 (×3): 237 mL via ORAL

## 2021-06-07 MED ORDER — PANTOPRAZOLE SODIUM 40 MG PO TBEC
40.0000 mg | DELAYED_RELEASE_TABLET | Freq: Every day | ORAL | Status: DC
Start: 2021-06-08 — End: 2021-06-09
  Administered 2021-06-08 – 2021-06-09 (×2): 40 mg via ORAL
  Filled 2021-06-07 (×2): qty 1

## 2021-06-07 MED ORDER — POTASSIUM CHLORIDE CRYS ER 20 MEQ PO TBCR
40.0000 meq | EXTENDED_RELEASE_TABLET | Freq: Once | ORAL | Status: AC
  Administered 2021-06-07: 40 meq via ORAL
  Filled 2021-06-07: qty 2

## 2021-06-07 MED ORDER — METHOCARBAMOL 500 MG PO TABS: 500.0000 mg | ORAL_TABLET | Freq: Four times a day (QID) | ORAL | Status: DC | PRN

## 2021-06-07 MED ORDER — SODIUM CHLORIDE (PF) 0.9 % IJ SOLN
INTRAMUSCULAR | Status: AC
  Filled 2021-06-07: qty 50

## 2021-06-07 MED ORDER — ATORVASTATIN CALCIUM 40 MG PO TABS
80.0000 mg | ORAL_TABLET | Freq: Every day | ORAL | Status: DC
  Administered 2021-06-07 – 2021-06-08 (×2): 80 mg via ORAL
  Filled 2021-06-07 (×2): qty 2

## 2021-06-07 MED ORDER — MORPHINE SULFATE (PF) 4 MG/ML IV SOLN
4.0000 mg | Freq: Once | INTRAVENOUS | Status: AC
  Administered 2021-06-07: 4 mg via INTRAVENOUS
  Filled 2021-06-07: qty 1

## 2021-06-07 MED ORDER — OXYCODONE HCL 5 MG PO TABS
5.0000 mg | ORAL_TABLET | ORAL | Status: DC | PRN
  Administered 2021-06-07 – 2021-06-08 (×3): 5 mg via ORAL
  Filled 2021-06-07 (×3): qty 1

## 2021-06-07 MED ORDER — CLOPIDOGREL BISULFATE 75 MG PO TABS
75.0000 mg | ORAL_TABLET | Freq: Every day | ORAL | Status: DC
  Filled 2021-06-07 (×2): qty 1

## 2021-06-07 MED ORDER — NITROGLYCERIN 0.4 MG SL SUBL: 0.4000 mg | SUBLINGUAL_TABLET | SUBLINGUAL | Status: DC | PRN

## 2021-06-07 MED ORDER — ASPIRIN EC 81 MG PO TBEC
81.0000 mg | DELAYED_RELEASE_TABLET | Freq: Every day | ORAL | Status: DC
  Administered 2021-06-07 – 2021-06-09 (×3): 81 mg via ORAL
  Filled 2021-06-07 (×3): qty 1

## 2021-06-07 MED ORDER — LIDOCAINE 5 % EX PTCH
1.0000 | MEDICATED_PATCH | CUTANEOUS | Status: DC
  Administered 2021-06-07: 1 via TRANSDERMAL
  Filled 2021-06-07 (×3): qty 1

## 2021-06-07 MED ORDER — LACTATED RINGERS IV SOLN
INTRAVENOUS | Status: AC
Start: 2021-06-07 — End: 2021-06-08

## 2021-06-07 MED ORDER — IOHEXOL 300 MG/ML  SOLN
100.0000 mL | Freq: Once | INTRAMUSCULAR | Status: AC | PRN
  Administered 2021-06-07: 100 mL via INTRAVENOUS

## 2021-06-07 MED ORDER — ONDANSETRON HCL 4 MG/2ML IJ SOLN
4.0000 mg | Freq: Once | INTRAMUSCULAR | Status: AC
  Administered 2021-06-07: 4 mg via INTRAVENOUS
  Filled 2021-06-07: qty 2

## 2021-06-07 MED ORDER — MAGNESIUM OXIDE -MG SUPPLEMENT 400 (240 MG) MG PO TABS
800.0000 mg | ORAL_TABLET | Freq: Once | ORAL | Status: AC
  Administered 2021-06-07: 800 mg via ORAL
  Filled 2021-06-07: qty 2

## 2021-06-07 NOTE — ED Notes (Signed)
Ordered wound vac and supplies,per WC RN. ?Portable brought wound vac, Materials to bring vac canister and vac sponges to [id number]   ?

## 2021-06-07 NOTE — ED Notes (Signed)
Surgical PA at bedside  

## 2021-06-07 NOTE — ED Provider Notes (Signed)
?[facility] DEPT ?Provider Note ? ? ?CSN: [id number] ?Arrival date & time: 06/07/21  7824 ? ?  ? ?History ? ?Chief Complaint  ?Patient presents with  ? Abdominal Pain  ? ? ?Valerie Fields is a 71 y.o. female. ? ?71 yo F with a chief complaints of right upper quadrant abdominal discomfort that radiates to the back.  This been going on just since this morning.  Denies any nausea or vomiting denies fevers.  Patient just had a ex lap with a hemicolectomy and a colostomy bag placement done last month.  Had been doing well with this.  Denies any urinary symptoms.  Worse with lying back flat and improved with sitting up. ? ? ?Abdominal Pain ? ?  ? ?Home Medications ?Prior to Admission medications   ?Medication Sig Start Date End Date Taking? Authorizing Provider  ?acetaminophen (TYLENOL) 500 MG tablet Take 2 tablets (1,000 mg total) by mouth every 6 (six) hours as needed. 05/29/21   Saverio Danker, PA-C  ?atorvastatin (LIPITOR) 80 MG tablet TAKE ONE TABLET BY MOUTH EVERYDAY AT BEDTIME ?Patient taking differently: Take 80 mg by mouth at bedtime. 04/19/21   Burnell Blanks, MD  ?azelastine (ASTELIN) 0.1 % nasal spray Place 1 spray into both nostrils daily. Use in each nostril as directed    [provider]  ?clopidogrel (PLAVIX) 75 MG tablet Take 1 tablet (75 mg total) by mouth daily. 08/13/20   Burnell Blanks, MD  ?dicyclomine (BENTYL) 10 MG capsule Take 1 capsule (10 mg total) by mouth 4 (four) times daily -  before meals and at bedtime. ?Patient taking differently: Take 10 mg by mouth daily as needed for spasms. 05/06/21   Levin Erp, PA  ?diphenhydrAMINE (SLEEP AID, DIPHENHYDRAMINE,) 25 MG tablet Take 25 mg by mouth at bedtime as needed for sleep. ?Patient not taking: Reported on 05/18/2021    [provider]  ?docusate sodium (COLACE) 100 MG capsule Take 100 mg by mouth daily as needed for mild constipation.    [provider]   ?lidocaine (LIDODERM) 5 % Place 1 patch onto the skin every 12 (twelve) hours. Remove & Discard patch within 12 hours or as directed by MD 05/30/21 05/30/22  Charlynne Cousins, MD  ?losartan (COZAAR) 25 MG tablet Take 1 tablet (25 mg total) by mouth every morning. 06/13/21   Charlynne Cousins, MD  ?methocarbamol (ROBAXIN) 500 MG tablet Take 1 tablet (500 mg total) by mouth every 6 (six) hours as needed for muscle spasms. 05/29/21   Saverio Danker, PA-C  ?metoprolol succinate (TOPROL-XL) 25 MG 24 hr tablet TAKE ONE TABLET BY MOUTH EVERY MORNING 04/19/21   Burnell Blanks, MD  ?montelukast (SINGULAIR) 10 MG tablet Take 10 mg by mouth at bedtime.  08/20/15   [provider]  ?nitroGLYCERIN (NITROSTAT) 0.4 MG SL tablet Place 1 tablet (0.4 mg total) under the tongue every 5 (five) minutes as needed for chest pain. ?Patient not taking: Reported on 05/18/2021 10/29/20 10/29/21  Burnell Blanks, MD  ?oxyCODONE (OXY IR/ROXICODONE) 5 MG immediate release tablet Take 1-2 tablets (5-10 mg total) by mouth every 6 (six) hours as needed for moderate pain. 05/29/21   Saverio Danker, PA-C  ?pantoprazole (PROTONIX) 40 MG tablet Take 1 tablet (40 mg total) by mouth 2 (two) times daily before a meal. ?Patient taking differently: Take 40 mg by mouth daily as needed (for acid reflux). 02/16/20   Levin Erp, PA  ?psyllium (REGULOID) 0.52 g capsule  Take 1.5 g by mouth daily.    [provider]  ?   ? ?Allergies    ?Sulfa antibiotics   ? ?Review of Systems   ?Review of Systems  ?Gastrointestinal:  Positive for abdominal pain.  ? ?Physical Exam ?Updated Vital Signs ?BP (!) 152/88 (BP Location: Right Arm)   Pulse 94   Temp 99.2 ?F (37.3 ?C) (Oral)   Resp 18   SpO2 96%  ?Physical Exam ?Vitals and nursing note reviewed.  ?Constitutional:   ?   General: She is not in acute distress. ?   Appearance: She is well-developed. She is not diaphoretic.  ?HENT:  ?   Head: Normocephalic and atraumatic.  ?Eyes:  ?    Pupils: Pupils are equal, round, and reactive to light.  ?Cardiovascular:  ?   Rate and Rhythm: Normal rate and regular rhythm.  ?   Heart sounds: No murmur heard. ?  No friction rub. No gallop.  ?Pulmonary:  ?   Effort: Pulmonary effort is normal.  ?   Breath sounds: No wheezing or rales.  ?Abdominal:  ?   General: There is no distension.  ?   Palpations: Abdomen is soft.  ?   Tenderness: There is abdominal tenderness.  ?   Comments: Mild pain to the epigastrium but worse pain to the right upper quadrant.  Negative Murphy sign.  Colostomy bag in place with output.  Wound VAC to the mid abdomen without obvious surrounding erythema or drainage.  ?Musculoskeletal:     ?   General: No tenderness.  ?   Cervical back: Normal range of motion and neck supple.  ?Skin: ?   General: Skin is warm and dry.  ?Neurological:  ?   Mental Status: She is alert and oriented to person, place, and time.  ?Psychiatric:     ?   Behavior: Behavior normal.  ? ? ?ED Results / Procedures / Treatments   ?Labs ?(all labs ordered are listed, but only abnormal results are displayed) ?Labs Reviewed  ?CBC WITH DIFFERENTIAL/PLATELET  ?COMPREHENSIVE METABOLIC PANEL  ?LIPASE, BLOOD  ?URINALYSIS, ROUTINE W REFLEX MICROSCOPIC  ?I-STAT CHEM 8, ED  ? ? ?EKG ?None ? ?Radiology ?No results found. ? ?Procedures ?Procedures  ? ? ?Medications Ordered in ED ?Medications - No data to display ? ?ED Course/ Medical Decision Making/ A&P ?  ?                        ?Medical Decision Making ?Amount and/or Complexity of Data Reviewed ?Labs: ordered. ?Radiology: ordered. ?ECG/medicine tests: ordered. ? ?Risk ?OTC drugs. ?Prescription drug management. ?Decision regarding hospitalization. ? ? ?71 yo F with a significant past medical history of a perforated bowel requiring ex lap and hemicolectomy presents with right upper quadrant abdominal discomfort.  She started this morning.  She is significantly uncomfortable to that area.  Temp of 99.  We will obtain a CT scan lab  work treat pain and nausea and reassess. ? ?CT scan with some nonspecific fluid collections.  There are some signs of the gallbladder wall may be slightly thickened and some pericholecystic fluid though also could be found in postoperative state.  I discussed this with general surgery who came and evaluated the patient at bedside.  Did recommend a right upper abdomen ultrasound.  They thought less likely to be intra-abdominal and could be related to her right-sided pleural effusion.  Did recommend medical admission. ? ?The patients results and plan were reviewed and  discussed.   ?Any x-rays performed were independently reviewed by myself.  ? ?Differential diagnosis were considered with the presenting HPI. ? ?Medications  ?sodium chloride (PF) 0.9 % injection (has no administration in time range)  ?acetaminophen (TYLENOL) tablet 650 mg (has no administration in time range)  ?  Or  ?acetaminophen (TYLENOL) suppository 650 mg (has no administration in time range)  ?HYDROmorphone (DILAUDID) injection 1 mg (has no administration in time range)  ?prochlorperazine (COMPAZINE) injection 10 mg (has no administration in time range)  ?potassium chloride SA (KLOR-CON M) CR tablet 40 mEq (40 mEq Oral Given 06/07/21 1110)  ?magnesium oxide (MAG-OX) tablet 800 mg (800 mg Oral Given 06/07/21 1110)  ?iohexol (OMNIPAQUE) 300 MG/ML solution 100 mL (100 mLs Intravenous Contrast Given 06/07/21 1055)  ?morphine (PF) 4 MG/ML injection 4 mg (4 mg Intravenous Given 06/07/21 1203)  ?ondansetron [facility]) injection 4 mg (4 mg Intravenous Given 06/07/21 1202)  ? ? ?Vitals:  ? 06/07/21 1230 06/07/21 1241 06/07/21 1245 06/07/21 1300  ?BP: 137/76   (!) 139/94  ?Pulse: 93 89 90 90  ?Resp: (!) 22 (!) 25 18 (!) 32  ?Temp:      ?TempSrc:      ?SpO2: 90% 91% 90% 97%  ? ? ?Final diagnoses:  ?RUQ abdominal pain  ? ? ?Admission/ observation were discussed with the admitting physician, patient and/or family and they are comfortable with the plan.   ? ? ? ? ? ? ? ? ?Final Clinical Impression(s) / ED Diagnoses ?Final diagnoses:  ?None  ? ? ?Rx / DC Orders ?ED Discharge Orders   ? ? None  ? ?  ? ? ?  ?Deno Etienne, DO ?06/07/21 1315 ? ?

## 2021-06-07 NOTE — ED Notes (Signed)
Patient transported to CT 

## 2021-06-07 NOTE — H&P (Signed)
History and Physical    Patient: Valerie Fields BZJ:[id number] DOB: 1951/02/06 DOA: 06/07/2021 DOS: the patient was seen and examined on 06/07/2021 PCP: Marda Stalker, PA-C  Patient coming from: Home  Chief Complaint:  Chief Complaint  Patient presents with   Abdominal Pain   HPI: Valerie Fields is a 71 y.o. female with medical history significant of seasonal allergies, asthma, chronic combined systolic and diastolic CHF, CAD, history of NSTEMI, ischemic cardiomyopathy, diverticulosis/diverticulitis, gallbladder disease, GERD, hyperlipidemia, IBS, arthralgias, multiple food allergies who was admitted from 05/17/2021 until 05/30/2021 due to a feculent peritonitis secondary to a perforated cecum.  The patient underwent a partial colectomy and loop sigmoid colostomy.  She has been recovering well at home until early this morning when she woke up with RUQ pain.  No nausea, vomiting or fever.  No diarrhea, constipation, melena or hematochezia.  No dysuria or hematuria.  No cough, dyspnea, wheezing or hemoptysis.  No chest pain, palpitations, diaphoresis, PND orthopnea.  No polyuria, polydipsia, polyphagia or blurred vision.  ED course: Initial vital signs were temperature 99.2 F, pulse 94, respiration 18, BP 152/88 mmHg O2 sat 96% on room air.  The patient received magnesium oxide 800 mg p.o., K. Dur 40 mEq p.o., morphine 4 mg IVP and ondansetron 4 mg IVP.  I added pantoprazole 40 mg IVP x1.  Lab work: Urinalysis  showed an elevated specific gravity with small hemoglobinuria but was otherwise unremarkable.  CBC with a white count 11.8 with 74% neutrophils, hemoglobin 11.4 g/dL  and platelets 735.  Lipase was 72.  CMP showed a potassium of 2.9 mmol/L, all other electrolytes were normal. renal function was normal.  Albumin was 2.3 g/dL, AST 55 and ALT 66 units/L.  The rest of the LFTs were normal.  Imaging: Portable 1 view chest radiograph shows persistent left lower lobe airspace disease  and probable  small left pleural effusion.  CT abdomen/pelvis with contrast significant for postop findings, cholelithiasis with small amount of pericholecystic fluid, bibasilar pleural effusions and atelectasis greater on the left, left adrenal myelolipoma and aortic atherosclerosis.  RUQ ultrasound showed cholelithiasis without evidence of acute cholecystitis and  Right pleural effusion.  Please see images and full radiology report for further details.  Review of Systems: As mentioned in the history of present illness. All other systems reviewed and are negative. Past Medical History:  Diagnosis Date   Allergy    Asthma    Chronic combined systolic and diastolic CHF 01/75/1025   Ischemic CM // Echocardiogram 7/21: EF 40-45, Gr 2 DD, mid to apical ant/ant-sept AK, RVSP 23.4, trivial MR   Coronary artery disease    NSTEMI 7/21: oLAD 30, pLAD 99 >> PCI: DES; D1 90 >> PCI: POBA, prox and mid RCA 15, EF 35   Diverticulitis    Gallbladder problem    GERD (gastroesophageal reflux disease)    History of heart attack    Hyperlipidemia 10/15/2019   Zetia DC'd during admit for NSTEMI 7/21 >> ? to Atorvastatin   IBS (irritable colon syndrome)    Ischemic cardiomyopathy 10/15/2019   Joint pain    Lactose intolerance    Multiple food allergies    Non-ST elevation (NSTEMI) MI 10/13/2019   PCI:  DES to LAD and POBA to D1   Past Surgical History:  Procedure Laterality Date   BREAST BIOPSY Left    No Scar seen    BREAST CYST EXCISION Left    No scar seen    CARDIAC CATHETERIZATION  COLONOSCOPY     CORONARY STENT INTERVENTION  10/13/2019   CORONARY STENT INTERVENTION N/A 10/13/2019   Procedure: CORONARY STENT INTERVENTION;  Surgeon: Nelva Bush, MD;  Location: [facility];  Service: Cardiovascular;  Laterality: N/A;  mid [place] N/A 05/19/2021   Procedure: FLEXIBLE SIGMOIDOSCOPY;  Surgeon: Irene Shipper, MD;  Location: [facility];  Service: Endoscopy;   Laterality: N/A;   LAPAROTOMY N/A 05/20/2021   Procedure: EXPLORATORY LAPAROTOMY WITH RIGHT HEMI  COLECTOMY; CREATION OF LOOP SIGMOID COLOSTOMY;  Surgeon: Clovis Riley, MD;  Location: [facility] OR[facility];  Service: General;  Laterality: N/A;   LEFT HEART CATH AND CORONARY ANGIOGRAPHY N/A 10/13/2019   Procedure: LEFT HEART CATH AND CORONARY ANGIOGRAPHY;  Surgeon: Nelva Bush, MD;  Location: [facility];  Service: Cardiovascular;  Laterality: N/A;   TONSILLECTOMY     Social History:  reports that she has never smoked. She has never used smokeless tobacco. She reports current alcohol use of about 1.0 standard drink per week. She reports that she does not use drugs.  Allergies  Allergen Reactions   Sulfa Antibiotics Other (See Comments)    As a child, turned red above neck    Family History  Problem Relation Age of Onset   GER disease Father    Aneurysm Mother    Hyperlipidemia Mother    Obesity Mother    Stomach cancer Maternal Grandfather    Colon cancer Neg Hx    Esophageal cancer Neg Hx    Pancreatic cancer Neg Hx    Rectal cancer Neg Hx    Breast cancer Neg Hx     Prior to Admission medications   Medication Sig Start Date End Date Taking? Authorizing Provider  acetaminophen (TYLENOL) 650 MG CR tablet Take 650 mg by mouth every 6 (six) hours as needed for pain (only takes with oxycodone).   Yes [provider]  aspirin EC 81 MG tablet Take 81 mg by mouth daily. Swallow whole.   Yes [provider]  atorvastatin (LIPITOR) 80 MG tablet TAKE ONE TABLET BY MOUTH EVERYDAY AT BEDTIME Patient taking differently: Take 80 mg by mouth at bedtime. 04/19/21  Yes Burnell Blanks, MD  azelastine (ASTELIN) 0.1 % nasal spray Place 1 spray into both nostrils daily. Use in each nostril as directed   Yes [provider]  lidocaine (LIDODERM) 5 % Place 1 patch onto the skin every 12 (twelve) hours. Remove & Discard patch within 12 hours or as directed by  MD Patient taking differently: Place 1 patch onto the skin daily. 05/30/21 05/30/22 Yes Charlynne Cousins, MD  losartan (COZAAR) 25 MG tablet Take 1 tablet (25 mg total) by mouth every morning. 06/13/21  Yes Charlynne Cousins, MD  methocarbamol (ROBAXIN) 500 MG tablet Take 1 tablet (500 mg total) by mouth every 6 (six) hours as needed for muscle spasms. 05/29/21  Yes Saverio Danker, PA-C  metoprolol succinate (TOPROL-XL) 25 MG 24 hr tablet TAKE ONE TABLET BY MOUTH EVERY MORNING Patient taking differently: Take 25 mg by mouth daily. 04/19/21  Yes Burnell Blanks, MD  montelukast (SINGULAIR) 10 MG tablet Take 10 mg by mouth at bedtime.  08/20/15  Yes [provider]  nitroGLYCERIN (NITROSTAT) 0.4 MG SL tablet Place 1 tablet (0.4 mg total) under the tongue every 5 (five) minutes as needed for chest pain. 10/29/20 10/29/21 Yes Burnell Blanks, MD  oxyCODONE (OXY IR/ROXICODONE) 5 MG immediate release tablet Take 1-2 tablets (5-10  mg total) by mouth every 6 (six) hours as needed for moderate pain. 05/29/21  Yes Saverio Danker, PA-C  pantoprazole (PROTONIX) 40 MG tablet Take 1 tablet (40 mg total) by mouth 2 (two) times daily before a meal. Patient taking differently: Take 40 mg by mouth daily as needed (acid reflux). 02/16/20  Yes Levin Erp, PA  acetaminophen (TYLENOL) 500 MG tablet Take 2 tablets (1,000 mg total) by mouth every 6 (six) hours as needed. Patient not taking: Reported on 06/07/2021 05/29/21   Saverio Danker, PA-C  clopidogrel (PLAVIX) 75 MG tablet Take 1 tablet (75 mg total) by mouth daily. Patient not taking: Reported on 06/07/2021 08/13/20   Burnell Blanks, MD  dicyclomine (BENTYL) 10 MG capsule Take 1 capsule (10 mg total) by mouth 4 (four) times daily -  before meals and at bedtime. Patient not taking: Reported on 06/07/2021 05/06/21   Levin Erp, [place]    Physical Exam: Vitals:   06/07/21 1241 06/07/21 1245 06/07/21 1300 06/07/21 1405  BP:    (!) 139/94 140/72  Pulse: 89 90 90 96  Resp: (!) 25 18 (!) 32 18  Temp:    98.9 F (37.2 C)  TempSrc:    Oral  SpO2: 91% 90% 97% 100%   Physical Exam Vitals and nursing note reviewed.  Constitutional:      Appearance: She is well-developed. She is obese.  HENT:     Head: Normocephalic.     Mouth/Throat:     Mouth: Mucous membranes are dry.  Eyes:     General: No scleral icterus.    Pupils: Pupils are equal, round, and reactive to light.  Neck:     Vascular: No JVD.  Cardiovascular:     Rate and Rhythm: Normal rate and regular rhythm.  Pulmonary:     Breath sounds: No wheezing or rhonchi.  Abdominal:     General: Bowel sounds are normal. There is no distension.     Palpations: Abdomen is soft.     Tenderness: There is abdominal tenderness in the right upper quadrant. There is right CVA tenderness. There is no left CVA tenderness, guarding or rebound.     Comments: Colostomy in place.  Midline wound VAC with no edema, erythema or discharge.  Musculoskeletal:     Cervical back: Neck supple.     Right lower leg: No edema.     Left lower leg: No edema.  Skin:    General: Skin is warm and dry.     Comments: Decreased skin turgor.  Neurological:     General: No focal deficit present.     Mental Status: She is alert and oriented to person, place, and time.  Psychiatric:        Mood and Affect: Mood normal.        Behavior: Behavior normal.   Data Reviewed:  There are no new results to review at this time.  Assessment and Plan: Principal Problem:   RUQ pain Positive cholelithiasis, but no cholecystitis. Observation/telemetry. General surgery cleared her for oral intake. Time-limited IV hydration. Oxycodone 5 mg every 4 hours as needed. Hydromorphone 1 mg q 4 hr PRN severe pain. Incentive spirometry every hour while awake.  Active Problems:   Chronic diastolic CHF Looks volume depleted. Echo today showed EF to 55%. Holding ARB due to recent AKI. Continue  metoprolol 25 mg daily.    Hyperlipidemia Continue atorvastatin 80 mg nightly.    GERD (gastroesophageal reflux disease) Continue pantoprazole 40 mg p.o.  daily.    Primary hypertension Continue holding losartan. Continue metoprolol 25 mg p.o. daily. Monitor blood pressure and heart rate.    CAD (coronary artery disease), native coronary artery Continue aspirin, atorvastatin, clopidogrel and metoprolol.    Hypokalemia Replacing. Follow-up potassium level.      Advance Care Planning:   Code Status: Full Code   Consults:   Family Communication:   Severity of Illness: The appropriate patient status for this patient is OBSERVATION. Observation status is judged to be reasonable and necessary in order to provide the required intensity of service to ensure the patient's safety. The patient's presenting symptoms, physical exam findings, and initial radiographic and laboratory data in the context of their medical condition is felt to place them at decreased risk for further clinical deterioration. Furthermore, it is anticipated that the patient will be medically stable for discharge from the hospital within 2 midnights of admission.   Author: Reubin Milan, MD 06/07/2021 2:26 PM  For on call review [email or web address].   This document was prepared using Dragon voice recognition software and may contain some unintended transcription errors.

## 2021-06-07 NOTE — ED Triage Notes (Signed)
Per EMS, patient from home, bowel resection on 2/21. Wound vac in place. C/o RUQ pain radiating to back since this morning. Pain 4/10. Vitals WNL. Ambulatory. ?

## 2021-06-07 NOTE — Consult Note (Addendum)
[place] Nurse Wound Consult Note: ?Pt is familiar to [facility] team from recent admission, Pt had colostomy surgery performed on 2/20. Pt is followed by the surgical team for assessment and plan of care; requested to change Vac dressing.  Pt has been on [employer] home negative pressure wound therapy prior to admission.  Removed machine and it is charging at the bedside; this can be re-connected upon discharge home and patient verbalized understanding.  ?Wound type: Full thickness healing post-op wound; 90% beefy red, 10% yellow, mod amt pink drainage in the cannister.  19X4X1.5cm ?Dressing procedure/placement/frequency: Pt denied need for pain meds.  Applied one piece of black foam and a barrier ring stretched into a line and applied to wound edge between colostomy pouch, which is located in close proximity to attempt to avoid a leakage.  ?[place] team will plan to change dressing on Mon, if patient is still in the hospital at that time.  ? ?[place] Nurse ostomy consult note ?Stoma type/location: There is no visible stoma to left abd, open hole below skin level is 1 1/2 inches and oval. Located in close proximity to the abd wound.  ?Peristomal assessment: intact skin surrounding ?Treatment options for stomal/peristomal skin:  Applied 2 barrier rings and one piece convex pouch and ostomy belt. 50cc tan liquid stool in the pouch.  Pt states she has not been able to perform pouch changes since the Vac is located nearby and home health has been performing with each dressing change.  Supplies left at the bedside for staff nurses use:  Use Supplies: 2 barrier rings, Kellie Simmering # [id number], convex pouch Kellie Simmering # [id number], and ostomy belt, Kellie Simmering # [id number] ?Enrolled patient in [employer] program: Yes, previously.   ?Julien Girt MSN, RN, [place], [place], CNS ?[phone]  ?

## 2021-06-07 NOTE — ED Notes (Signed)
Wound nurse at bedside.

## 2021-06-07 NOTE — Progress Notes (Addendum)
Subjective: This patient is well known to our service after undergoing an ex lap with a R hemicolectomy and loop sigmoid colostomy secondary to a very distal colonic stricture and perforated cecum by Dr. Kae Heller on 05/20/21.  She has a midline wound VAC in place as well as a colostomy with a retracted stoma.  She was discharged home on 3/2, POD 10.    She has been doing very well at home.  She has been eating and drinking well.  She denies any abdominal pain.  Her VAC has been changed and going well as well as her colostomy pouch.  However, this morning she awoke around 0500am with R flank pain.  This is worse with inspiration.  It is not reproducible to palpation. She hears a crackle sound and has some radiation of pain up her right side/back towards her shoulder.  She denies any N/V/chills/fevers/SOB.  She presented to the [facility] for evaluation.    Upon arrival her was noted to have a WBC of 11K which is the same as at time of discharge.  Her K is low at 2.9, but her AST/ALT are minimally up at 55/66.  Normal ALKPH and TB.  Her lipase is mildly up at 72.  She underwent a CT scan that reveals an incidental find og a tiny collection next to her ileocolic anastomosis [id number] along with a second collection in the left paracolic gutter [id number].  this could be sterile vs abscess.  She has cholelithiasis with a small amount of pericholecystic fluid which is nonspecific and could be related to surgery.  She is also noted to have B pleural effusions L>R.  Her CXR revealed persistent LLL airspace disease and effusion.   We have been asked to see her given her new symptoms and CT scan findings.  Objective: Vital signs in last 24 hours: Temp:  [99.2 F (37.3 C)-99.8 F (37.7 C)] 99.8 F (37.7 C) (03/10 1031) Pulse Rate:  [89-100] 90 (03/10 1300) Resp:  [17-32] 32 (03/10 1300) BP: (137-156)/(69-94) 139/94 (03/10 1300) SpO2:  [90 %-97 %] 97 % (03/10 1300)    Intake/Output from previous day: No  intake/output data recorded. Intake/Output this shift: No intake/output data recorded.  PE: Gen:  Alert, NAD, pleasant Heart: regular Lungs: RLL crackle noted, otherwise CTAB and moving air well.  Sats are in low 90s on RA Abd: Obese but soft, essentially nontender.  Can not elicit RUQ abdominal pain, even with attempt at Murphy's sign.  Midline wound with VAC in place.  Ostomy is retract, patent and working well  Lab Results:  Recent Labs    06/07/21 0931 06/07/21 0955  WBC 11.8*  --   HGB 11.4* 11.6*  HCT 35.0* 34.0*  PLT 735*  --     BMET Recent Labs    06/07/21 0931 06/07/21 0955  NA 137 140  K 2.9* 2.9*  CL 101 102  CO2 26  --   GLUCOSE 124* 125*  BUN 7* 5*  CREATININE 0.62 0.60  CALCIUM 8.9  --    PT/INR No results for input(s): LABPROT, INR in the last 72 hours. CMP     Component Value Date/Time   NA 140 06/07/2021 0955   NA 141 10/10/2020 0949   K 2.9 (L) 06/07/2021 0955   CL 102 06/07/2021 0955   CO2 26 06/07/2021 0931   GLUCOSE 125 (H) 06/07/2021 0955   BUN 5 (L) 06/07/2021 0955   BUN 13 10/10/2020 0949  CREATININE 0.60 06/07/2021 0955   CALCIUM 8.9 06/07/2021 0931   PROT 7.5 06/07/2021 0931   PROT 6.8 10/10/2020 0949   ALBUMIN 2.3 (L) 06/07/2021 0931   ALBUMIN 4.4 10/10/2020 0949   AST 55 (H) 06/07/2021 0931   ALT 66 (H) 06/07/2021 0931   ALKPHOS 73 06/07/2021 0931   BILITOT 0.3 06/07/2021 0931   BILITOT 0.6 10/10/2020 0949   GFRNONAA >60 06/07/2021 0931   GFRAA 86 01/30/2020 1050   Lipase     Component Value Date/Time   LIPASE 72 (H) 06/07/2021 0931    Studies/Results: CT ABDOMEN PELVIS W CONTRAST  Result Date: 06/07/2021 CLINICAL DATA:  Abdominal pain, had bowel resection on 05/21/2021, RIGHT upper quadrant pain radiating to back since this morning, has wound VAC; history coronary artery disease post MI, CHF, ischemic cardiomyopathy, asthma, hypertension EXAM: CT ABDOMEN AND PELVIS WITH CONTRAST TECHNIQUE: Multidetector CT imaging  of the abdomen and pelvis was performed using the standard protocol following bolus administration of intravenous contrast. RADIATION DOSE REDUCTION: This exam was performed according to the departmental dose-optimization program which includes automated exposure control, adjustment of the mA and/or kV according to patient size and/or use of iterative reconstruction technique. CONTRAST:  145m OMNIPAQUE IOHEXOL 300 MG/ML SOLN IV. No oral contrast. COMPARISON:  05/17/2021 FINDINGS: Lower chest: Bibasilar pleural effusions and atelectasis greater on LEFT. Enlargement of cardiac chambers. Hepatobiliary: Dependent small calculi in gallbladder. Small amount of pericholecystic fluid. Liver unremarkable. No biliary dilatation. Pancreas: Normal appearance Spleen: Normal appearance Adrenals/Urinary Tract: Small peripelvic cysts LEFT kidney. LEFT adrenal mass 22 x 19 mm, containing macroscopic fat, consistent with adrenal myelolipoma. RIGHT adrenal gland unremarkable. Kidneys, ureters, and bladder otherwise normal appearance. Stomach/Bowel: Prior ileocolic resection. Scattered colonic diverticulosis without evidence of diverticulitis. Descending loop colostomy. Stomach and remaining bowel loops normal appearance. Vascular/Lymphatic: Atherosclerotic calcifications aorta without aneurysm. No adenopathy. Scattered pelvic phleboliths. Reproductive: Unremarkable uterus and ovaries Other: No free air or hernia. Ventral surgical wound. Small fluid collection adjacent to ileocolic anastomosis in the RIGHT abdomen 15 x 14 mm. Second fluid collection with enhancing margins at the inferior LEFT paracolic gutter 4.5 x 2.2 cm extending approximately 7 cm length. These could represent sterile or infected postoperative collections. Minimal perisplenic fluid. Scattered stranding of tissue planes in abdomen consistent with surgery. Musculoskeletal: Unremarkable IMPRESSION: Prior ileocolic resection with descending loop colostomy. Small fluid  collection adjacent to ileocolic anastomosis in the RIGHT abdomen 15 x 14 mm and a second fluid collection with enhancing margins at the inferior LEFT paracolic gutter 4.5 x 2.2 x ~7 cm , could represent sterile or infected postoperative collections. Cholelithiasis with small amount of pericholecystic fluid, nonspecific, could be related to prior surgery. Bibasilar pleural effusions and atelectasis greater on LEFT. LEFT adrenal myelolipoma 22 x 19 mm. Aortic Atherosclerosis (ICD10-I70.0). Electronically Signed   By: MLavonia DanaM.D.   On: 06/07/2021 11:32   DG Chest Port 1 View  Result Date: 06/07/2021 CLINICAL DATA:  Upper abdominal pain this morning. EXAM: PORTABLE CHEST 1 VIEW COMPARISON:  Radiographs 05/23/2021 and 10/13/2019. FINDINGS: 0953 hours. Enteric tube has been removed. There are persistent low lung volumes with a possible small left pleural effusion and associated left lower lobe airspace disease. Minimal right basilar atelectasis is noted. No evidence of edema or pneumothorax. The heart size and mediastinal contours are stable with aortic atherosclerosis. No acute osseous findings are seen. Telemetry leads overlie the chest. IMPRESSION: Persistent left lower lobe airspace disease and probable small left pleural effusion. Electronically Signed  By: Richardean Sale M.D.   On: 06/07/2021 10:02    Anti-infectives: Anti-infectives (From admission, onward)    None        Assessment/Plan POD 18 s/p Exploratory laparotomy, right hemicolectomy, loop sigmoid colostomy for Very distal colonic stricture extremely deep in the pelvis and perforated cecum - Dr. Kae Heller, 05/20/2021 - The patient is doing very well postoperatively.  She is eating well and her colostomy is working well.  The CT fluid collections noted are essentially incidental at this time.  She has no abdominal pain related to her surgery.  Her wound is doing well with the [facility] and her colostomy is working well.  She is AF and her WBC  is essentially normal.  It is unlikely these fluid collections are infected or need treatment.  Will follow her abdominal exam.  No need for abx therapy or intervention at this time of these collections.  I have asked the [facility] RN to see her for her VAC change today as well as ostomy care prn.  New Right upper abd/flank/chest pain The patient does have some mild wall thickening of her gallbladder on her CT scan.  Her pain is not reproducible to palpation, but she does have some slight bump in her AST/ALT and lipase for unclear reasons.  Abdominal US has been ordered to further evaluate the gallbladder.  Her symptoms are not really biliary in nature as she has no N/V, has been eating well with no issues.  If her [place] is concerning for cholecystitis, she may require a HIDA scan.  If this were to be positive she would likely require a perc chole drain as she is over 2 weeks out from surgery and we are not able to go back in her abdomen for any further operations at this point.    With that being said, her symptoms seem more pleuritic in nature, pain is worse with inspiration and not reproducible to palpation in her abdomen.  She does have pleural effusions noted, L>R despite her pain being on the right.  It is possible that she is having discomfort from pleural irritation from these effusions as the etiology of her symptoms.  For now, we will await the read on her [place].  If this is equivocal, we will order a HIDA, if this is negative, no further intra-abdominal work up required.  ADDENDUM: [place] negative for cholecystitis.  Would likely try to treat effusions to see if this helps resolve her symptoms.  No plans for HIDA.  May have a diet from our standpoint.  FEN - may have a diet VTE - SCDs, ok for surgical prophylaxis from our standpoint ID - Cipro/Flagyl 2/17-21, Zosyn 2/21 >> 2/26, no further warranted at this time   - Per [facility] - Hypokalemia Hx CAD s/p DES 2021 - can likely resume plavix at this  point HTN HLD OSA Hx CHF   LOS: 0 days    Henreitta Cea , MD [facility] Surgery 06/07/2021, 1:55 PM Please see Amion for floor coverage during day hours 7:00am-4:30pm

## 2021-06-07 NOTE — ED Notes (Signed)
XR at bedside

## 2021-06-07 NOTE — Progress Notes (Signed)
Echocardiogram ?2D Echocardiogram has been performed. ? ?Valerie Fields ?06/07/2021, 3:24 PM ?

## 2021-06-08 DIAGNOSIS — I5032 Chronic diastolic (congestive) heart failure: Secondary | ICD-10-CM | POA: Diagnosis not present

## 2021-06-08 DIAGNOSIS — R1011 Right upper quadrant pain: Secondary | ICD-10-CM | POA: Diagnosis not present

## 2021-06-08 LAB — COMPREHENSIVE METABOLIC PANEL
ALT: 52 U/L — ABNORMAL HIGH (ref 0–44)
AST: 39 U/L (ref 15–41)
Albumin: 2 g/dL — ABNORMAL LOW (ref 3.5–5.0)
Alkaline Phosphatase: 64 U/L (ref 38–126)
Anion gap: 8 (ref 5–15)
BUN: 9 mg/dL (ref 8–23)
CO2: 26 mmol/L (ref 22–32)
Calcium: 8.6 mg/dL — ABNORMAL LOW (ref 8.9–10.3)
Chloride: 102 mmol/L (ref 98–111)
Creatinine, Ser: 0.66 mg/dL (ref 0.44–1.00)
GFR, Estimated: >60 mL/min (ref >60)
Glucose, Bld: 119 mg/dL — ABNORMAL HIGH (ref 70–99)
Potassium: 3.3 mmol/L — ABNORMAL LOW (ref 3.5–5.1)
Sodium: 136 mmol/L (ref 135–145)
Total Bilirubin: 0.2 mg/dL — ABNORMAL LOW (ref 0.3–1.2)
Total Protein: 6.9 g/dL (ref 6.5–8.1)

## 2021-06-08 LAB — CBC
HCT: 32 % — ABNORMAL LOW (ref 36.0–46.0)
Hemoglobin: 10.3 g/dL — ABNORMAL LOW (ref 12.0–15.0)
MCH: 30.7 pg (ref 26.0–34.0)
MCHC: 32.2 g/dL (ref 30.0–36.0)
MCV: 95.2 fL (ref 80.0–100.0)
Platelets: 691 10*3/uL — ABNORMAL HIGH (ref 150–400)
RBC: 3.36 MIL/uL — ABNORMAL LOW (ref 3.87–5.11)
RDW: 13.8 % (ref 11.5–15.5)
WBC: 10.7 10*3/uL — ABNORMAL HIGH (ref 4.0–10.5)
nRBC: 0 % (ref 0.0–0.2)

## 2021-06-08 LAB — LIPASE, BLOOD: Lipase: 58 U/L — ABNORMAL HIGH (ref 11–51)

## 2021-06-08 MED ORDER — FUROSEMIDE 10 MG/ML IJ SOLN
40.0000 mg | Freq: Once | INTRAMUSCULAR | Status: AC
  Administered 2021-06-08: 40 mg via INTRAVENOUS
  Filled 2021-06-08: qty 4

## 2021-06-08 NOTE — Progress Notes (Signed)
?  Progress Note ? ? ?Patient: Valerie Fields KGY:[id number] DOB: 12/30/1950 DOA: 06/07/2021     0 ?DOS: the patient was seen and examined on 06/08/2021 ?  ?Brief hospital course: ?71 y.o. female with medical history significant of seasonal allergies, asthma, chronic combined systolic and diastolic CHF, CAD, history of NSTEMI, ischemic cardiomyopathy, diverticulosis/diverticulitis, gallbladder disease, GERD, hyperlipidemia, IBS, arthralgias, multiple food allergies who was admitted from 05/17/2021 until 05/30/2021 due to a feculent peritonitis secondary to a perforated cecum.  The patient underwent a partial colectomy and loop sigmoid colostomy.  She has been recovering well at home until early this morning when she woke up with RUQ pain.  No nausea, vomiting or fever.  No diarrhea, constipation, melena or hematochezia.  No dysuria or hematuria.  No cough, dyspnea, wheezing or hemoptysis.  No chest pain, palpitations, diaphoresis, PND orthopnea.  No polyuria, polydipsia, polyphagia or blurred vision. ? ?Assessment and Plan: ?No notes have been filed under this hospital service. ?Service: Hospitalist ? ?Principal Problem: ?  RUQ pain ?Positive cholelithiasis, but no cholecystitis. ?General surgery cleared her for oral intake. ?Oxycodone 5 mg every 4 hours as needed. ?Hydromorphone 1 mg q 4 hr PRN severe pain. ?Incentive spirometry every hour while awake. ?Suspect secondary to pleural effusion on imaging ?  ?Active Problems: ?  Chronic diastolic CHF ?Looks volume depleted. ?Echo today showed EF to 02%, grade 2 diastolic dysfunction on echo with dilated IVC and increased R atrial pressure ?Helding ARB at time of admit to recent AKI. ?Continue metoprolol 25 mg daily. ?Will give trial of IV lasix ?  ?  Hyperlipidemia ?Continue atorvastatin 80 mg nightly. ?  ?  GERD (gastroesophageal reflux disease) ?Continue pantoprazole 40 mg p.o. daily. ?  ?  Primary hypertension ?Losartan on hold initially  ?Continue metoprolol 25 mg  p.o. daily. ?BP remains very well controlled ?  ?  CAD (coronary artery disease), native coronary artery ?Continue aspirin, atorvastatin, clopidogrel and metoprolol. ?  ?  Hypokalemia ?Replaced ?Repeat bmet in AM ?  ? ?  ? ?Subjective: Denies sob. Cont with mild R sided back pains ? ?Physical Exam: ?Vitals:  ? 06/07/21 2332 06/08/21 0300 06/08/21 1007 06/08/21 1307  ?BP: 116/74 (!) 113/59  (!) 118/59  ?Pulse: 81 77  91  ?Resp:    18  ?Temp: 98.2 ?F (36.8 ?C) 98.4 ?F (36.9 ?C)  98.6 ?F (37 ?C)  ?TempSrc: Oral Oral  Oral  ?SpO2: 97% 95% 93% 93%  ?Weight:      ?Height:      ? ?General exam: Awake, laying in bed, in nad ?Respiratory system: Normal respiratory effort, no wheezing ?Cardiovascular system: regular rate, s1, s2 ?Gastrointestinal system: Soft, nondistended, positive BS ?Central nervous system: CN2-12 grossly intact, strength intact ?Extremities: Perfused, no clubbing ?Skin: Normal skin turgor, no notable skin lesions seen ?Psychiatry: Mood normal // no visual hallucinations  ? ?Data Reviewed: ? ?Labs reviewed. K of 3.3 with Cr 0.66 ? ?Family Communication: Pt in room, family not at bedside ? ?Disposition: ?Status is: Observation ?The patient remains OBS appropriate and will d/c before 2 midnights. ? Planned Discharge Destination: Home ? ? ? ?Author: ?Marylu Lund, MD ?06/08/2021 3:25 PM ? ?For on call review [email or web address].  ?

## 2021-06-08 NOTE — Progress Notes (Signed)
Subjective/Chief Complaint: PT with con't R flank/back pain Pt tol PO w/o change in pain   Objective: Vital signs in last 24 hours: Temp:  [97.8 F (36.6 C)-99.8 F (37.7 C)] 98.4 F (36.9 C) (03/11 0300) Pulse Rate:  [77-100] 77 (03/11 0300) Resp:  [17-32] 18 (03/10 1732) BP: (110-156)/(59-94) 113/59 (03/11 0300) SpO2:  [89 %-100 %] 95 % (03/11 0300) Weight:  [85.6 kg] 85.6 kg (03/10 1430) Last BM Date : 06/07/21  Intake/Output from previous day: 03/10 0701 - 03/11 0700 In: 959.2 [P.O.:120; I.V.:839.2] Out: 750 [Urine:650; Stool:100] Intake/Output this shift: No intake/output data recorded.  PE:  Constitutional: No acute distress, conversant, appears states age. Eyes: Anicteric sclerae, moist conjunctiva, no lid lag Lungs: Clear to auscultation bilaterally, normal respiratory effort CV: regular rate and rhythm, no murmurs, no peripheral edema, pedal pulses 2+ GI: Soft, no masses or hepatosplenomegaly, non-tender to palpation to deep palp RUQ Skin: No rashes, palpation reveals normal turgor Psychiatric: appropriate judgment and insight, oriented to person, place, and time   Lab Results:  Recent Labs    06/07/21 0931 06/07/21 0955 06/08/21 0412  WBC 11.8*  --  10.7*  HGB 11.4* 11.6* 10.3*  HCT 35.0* 34.0* 32.0*  PLT 735*  --  691*   BMET Recent Labs    06/07/21 0931 06/07/21 0955 06/08/21 0412  NA 137 140 136  K 2.9* 2.9* 3.3*  CL 101 102 102  CO2 26  --  26  GLUCOSE 124* 125* PENDING  BUN 7* 5* PENDING  CREATININE 0.62 0.60 PENDING  CALCIUM 8.9  --  8.6*   PT/INR No results for input(s): LABPROT, INR in the last 72 hours. ABG No results for input(s): PHART, HCO3 in the last 72 hours.  Invalid input(s): PCO2, PO2  Studies/Results: CT ABDOMEN PELVIS W CONTRAST  Result Date: 06/07/2021 CLINICAL DATA:  Abdominal pain, had bowel resection on 05/21/2021, RIGHT upper quadrant pain radiating to back since this morning, has wound VAC; history  coronary artery disease post MI, CHF, ischemic cardiomyopathy, asthma, hypertension EXAM: CT ABDOMEN AND PELVIS WITH CONTRAST TECHNIQUE: Multidetector CT imaging of the abdomen and pelvis was performed using the standard protocol following bolus administration of intravenous contrast. RADIATION DOSE REDUCTION: This exam was performed according to the departmental dose-optimization program which includes automated exposure control, adjustment of the mA and/or kV according to patient size and/or use of iterative reconstruction technique. CONTRAST:  185m OMNIPAQUE IOHEXOL 300 MG/ML SOLN IV. No oral contrast. COMPARISON:  05/17/2021 FINDINGS: Lower chest: Bibasilar pleural effusions and atelectasis greater on LEFT. Enlargement of cardiac chambers. Hepatobiliary: Dependent small calculi in gallbladder. Small amount of pericholecystic fluid. Liver unremarkable. No biliary dilatation. Pancreas: Normal appearance Spleen: Normal appearance Adrenals/Urinary Tract: Small peripelvic cysts LEFT kidney. LEFT adrenal mass 22 x 19 mm, containing macroscopic fat, consistent with adrenal myelolipoma. RIGHT adrenal gland unremarkable. Kidneys, ureters, and bladder otherwise normal appearance. Stomach/Bowel: Prior ileocolic resection. Scattered colonic diverticulosis without evidence of diverticulitis. Descending loop colostomy. Stomach and remaining bowel loops normal appearance. Vascular/Lymphatic: Atherosclerotic calcifications aorta without aneurysm. No adenopathy. Scattered pelvic phleboliths. Reproductive: Unremarkable uterus and ovaries Other: No free air or hernia. Ventral surgical wound. Small fluid collection adjacent to ileocolic anastomosis in the RIGHT abdomen 15 x 14 mm. Second fluid collection with enhancing margins at the inferior LEFT paracolic gutter 4.5 x 2.2 cm extending approximately 7 cm length. These could represent sterile or infected postoperative collections. Minimal perisplenic fluid. Scattered stranding of  tissue planes in abdomen consistent with  surgery. Musculoskeletal: Unremarkable IMPRESSION: Prior ileocolic resection with descending loop colostomy. Small fluid collection adjacent to ileocolic anastomosis in the RIGHT abdomen 15 x 14 mm and a second fluid collection with enhancing margins at the inferior LEFT paracolic gutter 4.5 x 2.2 x ~7 cm , could represent sterile or infected postoperative collections. Cholelithiasis with small amount of pericholecystic fluid, nonspecific, could be related to prior surgery. Bibasilar pleural effusions and atelectasis greater on LEFT. LEFT adrenal myelolipoma 22 x 19 mm. Aortic Atherosclerosis (ICD10-I70.0). Electronically Signed   By: Lavonia Dana M.D.   On: 06/07/2021 11:32   DG Chest Port 1 View  Result Date: 06/07/2021 CLINICAL DATA:  Upper abdominal pain this morning. EXAM: PORTABLE CHEST 1 VIEW COMPARISON:  Radiographs 05/23/2021 and 10/13/2019. FINDINGS: 0953 hours. Enteric tube has been removed. There are persistent low lung volumes with a possible small left pleural effusion and associated left lower lobe airspace disease. Minimal right basilar atelectasis is noted. No evidence of edema or pneumothorax. The heart size and mediastinal contours are stable with aortic atherosclerosis. No acute osseous findings are seen. Telemetry leads overlie the chest. IMPRESSION: Persistent left lower lobe airspace disease and probable small left pleural effusion. Electronically Signed   By: Richardean Sale M.D.   On: 06/07/2021 10:02   ECHOCARDIOGRAM COMPLETE  Result Date: 06/07/2021    ECHOCARDIOGRAM REPORT   Patient Name:   Valerie Fields [facility] [place] Date of Exam: 06/07/2021 Medical Rec #:  [id number]            Height:       62.0 in Accession #:    [id number]           Weight:       188.7 lb Date of Birth:  [birth year]            BSA:          1.865 m Patient Age:    71 years             BP:           140/72 mmHg Patient Gender: F                    HR:           96 bpm. Exam  Location:  Inpatient Procedure: 2D Echo Indications:    CHF, Pleural effusion  History:        Patient has prior history of Echocardiogram examinations, most                 recent 01/25/2020. Cardiomyopathy, Previous Myocardial                 Infarction and CAD; Risk Factors:Dyslipidemia.  Sonographer:    Arlyss Gandy Referring Phys: [id number] [place]  Sonographer Comments: Patient supine (post op). IMPRESSIONS  1. Left ventricular ejection fraction, by estimation, is 55%. The left ventricle has low normal function. Left ventricular endocardial border not optimally defined to evaluate regional wall motion. Left ventricular diastolic parameters are consistent with Grade II diastolic dysfunction (pseudonormalization).  2. Right ventricular systolic function is normal. The right ventricular size is normal. There is moderately elevated pulmonary artery systolic pressure.  3. Moderate pleural effusion in the left lateral region.  4. The mitral valve is grossly normal. Trivial mitral valve regurgitation. Moderate mitral annular calcification.  5. The aortic valve is tricuspid. Aortic valve regurgitation is not visualized. No aortic stenosis is present.  6. The inferior vena cava is dilated  in size with >50% respiratory variability, suggesting right atrial pressure of 8 mmHg. Comparison(s): Worse study form prior, LVEF appears to have improved. FINDINGS  Left Ventricle: Left ventricular ejection fraction, by estimation, is 55%. The left ventricle has low normal function. Left ventricular endocardial border not optimally defined to evaluate regional wall motion. The left ventricular internal cavity size was normal in size. There is no left ventricular hypertrophy. Left ventricular diastolic parameters are consistent with Grade II diastolic dysfunction (pseudonormalization). Right Ventricle: The right ventricular size is normal. No increase in right ventricular wall thickness. Right ventricular systolic function  is normal. There is moderately elevated pulmonary artery systolic pressure. The tricuspid regurgitant velocity is 3.09 m/s, and with an assumed right atrial pressure of 8 mmHg, the estimated right ventricular systolic pressure is 76.1 mmHg. Left Atrium: Left atrial size was normal in size. Right Atrium: Right atrial size was normal in size. Pericardium: There is no evidence of pericardial effusion. Presence of epicardial fat layer. Mitral Valve: The mitral valve is grossly normal. Moderate mitral annular calcification. Trivial mitral valve regurgitation. MV peak gradient, 12.5 mmHg. The mean mitral valve gradient is 4.0 mmHg. Tricuspid Valve: The tricuspid valve is normal in structure. Tricuspid valve regurgitation is mild . No evidence of tricuspid stenosis. Aortic Valve: The aortic valve is tricuspid. Aortic valve regurgitation is not visualized. No aortic stenosis is present. Aortic valve mean gradient measures 3.0 mmHg. Aortic valve peak gradient measures 5.7 mmHg. Aortic valve area, by VTI measures 2.32 cm. Pulmonic Valve: The pulmonic valve was normal in structure. Pulmonic valve regurgitation is not visualized. No evidence of pulmonic stenosis. Aorta: The aortic root, ascending aorta and aortic arch are all structurally normal, with no evidence of dilitation or obstruction. Venous: The inferior vena cava is dilated in size with greater than 50% respiratory variability, suggesting right atrial pressure of 8 mmHg. IAS/Shunts: No atrial level shunt detected by color flow Doppler. Additional Comments: There is a moderate pleural effusion in the left lateral region.  LEFT VENTRICLE PLAX 2D LVIDd:         3.80 cm   Diastology LVIDs:         2.50 cm   LV e' medial:    5.22 cm/s LV PW:         0.90 cm   LV E/e' medial:  18.1 LV IVS:        1.00 cm   LV e' lateral:   5.77 cm/s LVOT diam:     1.80 cm   LV E/e' lateral: 16.4 LV SV:         60 LV SV Index:   32 LVOT Area:     2.54 cm  IVC IVC diam: 2.10 cm LEFT ATRIUM              Index LA diam:        3.30 cm 1.77 cm/m LA Vol (A2C):   55.6 ml 29.81 ml/m LA Vol (A4C):   40.9 ml 21.93 ml/m LA Biplane Vol: 48.1 ml 25.79 ml/m  AORTIC VALVE AV Area (Vmax):    2.11 cm AV Area (Vmean):   2.17 cm AV Area (VTI):     2.32 cm AV Vmax:           119.00 cm/s AV Vmean:          83.900 cm/s AV VTI:            0.257 m AV Peak Grad:      5.7 mmHg  AV Mean Grad:      3.0 mmHg LVOT Vmax:         98.80 cm/s LVOT Vmean:        71.500 cm/s LVOT VTI:          0.234 m LVOT/AV VTI ratio: 0.91  AORTA Ao Root diam: 3.20 cm Ao Asc diam:  3.20 cm MITRAL VALVE                TRICUSPID VALVE MV Area (PHT): 2.68 cm     TR Peak grad:   38.2 mmHg MV Area VTI:   1.96 cm     TR Vmax:        309.00 cm/s MV Peak grad:  12.5 mmHg MV Mean grad:  4.0 mmHg     SHUNTS MV Vmax:       1.76 m/s     Systemic VTI:  0.23 m MV Vmean:      87.6 cm/s    Systemic Diam: 1.80 cm MV Decel Time: 283 msec MV E velocity: 94.60 cm/s MV A velocity: 141.00 cm/s MV E/A ratio:  0.67 Rudean Haskell MD Electronically signed by Rudean Haskell MD Signature Date/Time: 06/07/2021/5:35:12 PM    Final    US Abdomen Limited RUQ (LIVER/GB)  Result Date: 06/07/2021 CLINICAL DATA:  Right upper quadrant pain EXAM: ULTRASOUND ABDOMEN LIMITED RIGHT UPPER QUADRANT COMPARISON:  Same day CT FINDINGS: Gallbladder: Cholelithiasis. Mild nonspecific wall thickening. The gallbladder is nondilated. Negative sonographic Murphy sign. Common bile duct: Diameter: 2.7 mm Liver: No focal lesion identified. Within normal limits in parenchymal echogenicity. Portal vein is patent on color Doppler imaging with normal direction of blood flow towards the liver. Other: Right pleural effusion IMPRESSION: Cholelithiasis.  No evidence of acute cholecystitis. Right pleural effusion Electronically Signed   By: Maurine Simmering M.D.   On: 06/07/2021 12:41    Anti-infectives: Anti-infectives (From admission, onward)    None       Assessment/Plan: POD 19 s/p  Exploratory laparotomy, right hemicolectomy, loop sigmoid colostomy for Very distal colonic stricture extremely deep in the pelvis and perforated cecum - Dr. Kae Heller, 05/20/2021 - The patient is doing very well postoperatively.  She is eating well and her colostomy is working well.  New Right upper abd/flank/chest pain -likely 2/2 to pleural eff Con't PO -No plans for surgery   FEN - may have a diet VTE - SCDs, ok for surgical prophylaxis from our standpoint ID - Cipro/Flagyl 2/17-21, Zosyn 2/21 >> 2/26, no further warranted at this time     - Per [facility] - Hypokalemia Hx CAD s/p DES 2021 - can likely resume plavix at this point HTN HLD OSA Hx CHF  LOS: 0 days    Ralene Ok 06/08/2021

## 2021-06-09 DIAGNOSIS — R1011 Right upper quadrant pain: Secondary | ICD-10-CM | POA: Diagnosis not present

## 2021-06-09 DIAGNOSIS — I5032 Chronic diastolic (congestive) heart failure: Secondary | ICD-10-CM | POA: Diagnosis not present

## 2021-06-09 LAB — COMPREHENSIVE METABOLIC PANEL
ALT: 62 U/L — ABNORMAL HIGH (ref 0–44)
AST: 44 U/L — ABNORMAL HIGH (ref 15–41)
Albumin: 2.1 g/dL — ABNORMAL LOW (ref 3.5–5.0)
Alkaline Phosphatase: 68 U/L (ref 38–126)
Anion gap: 8 (ref 5–15)
BUN: 10 mg/dL (ref 8–23)
CO2: 28 mmol/L (ref 22–32)
Calcium: 8.8 mg/dL — ABNORMAL LOW (ref 8.9–10.3)
Chloride: 101 mmol/L (ref 98–111)
Creatinine, Ser: 0.65 mg/dL (ref 0.44–1.00)
GFR, Estimated: >60 mL/min (ref >60)
Glucose, Bld: 115 mg/dL — ABNORMAL HIGH (ref 70–99)
Potassium: 3.2 mmol/L — ABNORMAL LOW (ref 3.5–5.1)
Sodium: 137 mmol/L (ref 135–145)
Total Bilirubin: 0.4 mg/dL (ref 0.3–1.2)
Total Protein: 7.1 g/dL (ref 6.5–8.1)

## 2021-06-09 MED ORDER — POTASSIUM CHLORIDE CRYS ER 20 MEQ PO TBCR
40.0000 meq | EXTENDED_RELEASE_TABLET | ORAL | Status: DC
  Administered 2021-06-09: 40 meq via ORAL
  Filled 2021-06-09: qty 2

## 2021-06-09 NOTE — Progress Notes (Signed)
AVS given to patient and explained at the bedside. Medications and follow up appointments have been explained with pt verbalizing understanding.  

## 2021-06-09 NOTE — Discharge Summary (Signed)
Physician Discharge Summary   Patient: Valerie Fields MRN: [id number] DOB: [birth year]  Admit date:     06/07/2021  Discharge date: 06/09/21  Discharge Physician: Marylu Lund   PCP: Marda Stalker, PA-C   Recommendations at discharge:    Follow up with PCP in 1-2 weeks Follow up with Cardiology as scheduled  Discharge Diagnoses: Principal Problem:   RUQ pain Active Problems:   Chronic diastolic heart failure (HCC)   Hyperlipidemia   GERD (gastroesophageal reflux disease)   Primary hypertension   CAD (coronary artery disease), native coronary artery   Hypokalemia  Resolved Problems:   * No resolved hospital problems. *  Hospital Course: 71 y.o. female with medical history significant of seasonal allergies, asthma, chronic combined systolic and diastolic CHF, CAD, history of NSTEMI, ischemic cardiomyopathy, diverticulosis/diverticulitis, gallbladder disease, GERD, hyperlipidemia, IBS, arthralgias, multiple food allergies who was admitted from 05/17/2021 until 05/30/2021 due to a feculent peritonitis secondary to a perforated cecum.  The patient underwent a partial colectomy and loop sigmoid colostomy.  She has been recovering well at home until early this morning when she woke up with RUQ pain.  No nausea, vomiting or fever.  No diarrhea, constipation, melena or hematochezia.  No dysuria or hematuria.  No cough, dyspnea, wheezing or hemoptysis.  No chest pain, palpitations, diaphoresis, PND orthopnea.  No polyuria, polydipsia, polyphagia or blurred vision.   Assessment and Plan: No notes have been filed under this hospital service. Service: Hospitalist  Principal Problem:   RUQ pain Positive cholelithiasis, but no cholecystitis. Tolerated PO intake Oxycodone 5 mg every 4 hours as needed. Suspect pain secondary to pleural effusion on imaging   Active Problems: Acute on Chronic diastolic CHF Echo at presentation showed EF to 48%, grade 2 diastolic dysfunction on echo  with dilated IVC and increased R atrial pressure Pt reportedly received aggressive fluids during recent hospitalization Continue metoprolol 25 mg daily. Clinically improved after trial of IV lasix Cont home meds on d/c     Hyperlipidemia Continue atorvastatin 80 mg nightly.     GERD (gastroesophageal reflux disease) Continue pantoprazole 40 mg p.o. daily.     Primary hypertension Losartan on hold initially  Continue metoprolol 25 mg p.o. daily. BP remains very well controlled     CAD (coronary artery disease), native coronary artery Continue aspirin, atorvastatin, clopidogrel and metoprolol.     Hypokalemia Replaced        Consultants: General Surgery Procedures performed:   Disposition: Home Diet recommendation:  Cardiac diet DISCHARGE MEDICATION: Allergies as of 06/09/2021       Reactions   Sulfa Antibiotics Other (See Comments)   As a child, turned red above neck        Medication List     STOP taking these medications    dicyclomine 10 MG capsule Commonly known as: BENTYL       TAKE these medications    acetaminophen 650 MG CR tablet Commonly known as: TYLENOL Take 650 mg by mouth every 6 (six) hours as needed for pain (only takes with oxycodone).   acetaminophen 500 MG tablet Commonly known as: TYLENOL Take 2 tablets (1,000 mg total) by mouth every 6 (six) hours as needed.   aspirin EC 81 MG tablet Take 81 mg by mouth daily. Swallow whole.   atorvastatin 80 MG tablet Commonly known as: LIPITOR TAKE ONE TABLET BY MOUTH EVERYDAY AT BEDTIME What changed: See the new instructions.   azelastine 0.1 % nasal spray Commonly known as: ASTELIN Place 1 spray  into both nostrils daily. Use in each nostril as directed   clopidogrel 75 MG tablet Commonly known as: PLAVIX Take 1 tablet (75 mg total) by mouth daily.   lidocaine 5 % Commonly known as: Lidoderm Place 1 patch onto the skin every 12 (twelve) hours. Remove & Discard patch within 12 hours  or as directed by MD What changed:  when to take this additional instructions   losartan 25 MG tablet Commonly known as: COZAAR Take 1 tablet (25 mg total) by mouth every morning. Start taking on: June 13, 2021   methocarbamol 500 MG tablet Commonly known as: ROBAXIN Take 1 tablet (500 mg total) by mouth every 6 (six) hours as needed for muscle spasms.   metoprolol succinate 25 MG 24 hr tablet Commonly known as: TOPROL-XL TAKE ONE TABLET BY MOUTH EVERY MORNING What changed: when to take this   montelukast 10 MG tablet Commonly known as: SINGULAIR Take 10 mg by mouth at bedtime.   nitroGLYCERIN 0.4 MG SL tablet Commonly known as: NITROSTAT Place 1 tablet (0.4 mg total) under the tongue every 5 (five) minutes as needed for chest pain.   oxyCODONE 5 MG immediate release tablet Commonly known as: Oxy IR/ROXICODONE Take 1-2 tablets (5-10 mg total) by mouth every 6 (six) hours as needed for moderate pain.   pantoprazole 40 MG tablet Commonly known as: PROTONIX Take 1 tablet (40 mg total) by mouth 2 (two) times daily before a meal. What changed:  when to take this reasons to take this        Follow-up Information     Marda Stalker, PA-C Follow up in 2 week(s).   Specialty: Family Medicine Why: Hospital follow up Contact information: [place] [address] [phone]         Burnell Blanks, MD .   Specialty: Cardiology Contact information: [place]. [address] [place] [address] [phone]                Discharge Exam: Danley Danker Weights   06/07/21 1430  Weight: 85.6 kg   General exam: Awake, laying in bed, in nad Respiratory system: Normal respiratory effort, no wheezing Cardiovascular system: regular rate, s1, s2 Gastrointestinal system: Soft, nondistended, positive BS Central nervous system: CN2-12 grossly intact, strength intact Extremities: Perfused, no clubbing Skin: Normal skin turgor, no notable  skin lesions seen Psychiatry: Mood normal // no visual hallucinations   Condition at discharge: good  The results of significant diagnostics from this hospitalization (including imaging, microbiology, ancillary and laboratory) are listed below for reference.   Imaging Studies: DG Abd 1 View  Result Date: 05/18/2021 CLINICAL DATA:  71 year old female with distal colonic obstruction. EXAM: ABDOMEN - 1 VIEW COMPARISON:  CT Abdomen and Pelvis 05/17/2021. FINDINGS: Portable AP supine view at 0747 hours. Ongoing moderate to severe dilated large bowel to the pelvis. Comparison CT yesterday with appearance suspicious for apple-core lesion at the rectosigmoid junction. No pneumoperitoneum identified on the supine views. Grossly negative lung bases. No acute osseous abnormality identified. IMPRESSION: High-grade distal large bowel obstruction with CT appearance suspicious for rectosigmoid junction apple-core lesion. Distal Colon Carcinoma until proven otherwise. Recommended Sigmoidoscopy. Electronically Signed   By: Genevie Ann M.D.   On: 05/18/2021 08:21   CT ABDOMEN PELVIS W CONTRAST  Result Date: 06/07/2021 CLINICAL DATA:  Abdominal pain, had bowel resection on 05/21/2021, RIGHT upper quadrant pain radiating to back since this morning, has wound VAC; history coronary artery disease post MI, CHF, ischemic  cardiomyopathy, asthma, hypertension EXAM: CT ABDOMEN AND PELVIS WITH CONTRAST TECHNIQUE: Multidetector CT imaging of the abdomen and pelvis was performed using the standard protocol following bolus administration of intravenous contrast. RADIATION DOSE REDUCTION: This exam was performed according to the departmental dose-optimization program which includes automated exposure control, adjustment of the mA and/or kV according to patient size and/or use of iterative reconstruction technique. CONTRAST:  162m OMNIPAQUE IOHEXOL 300 MG/ML SOLN IV. No oral contrast. COMPARISON:  05/17/2021 FINDINGS: Lower chest:  Bibasilar pleural effusions and atelectasis greater on LEFT. Enlargement of cardiac chambers. Hepatobiliary: Dependent small calculi in gallbladder. Small amount of pericholecystic fluid. Liver unremarkable. No biliary dilatation. Pancreas: Normal appearance Spleen: Normal appearance Adrenals/Urinary Tract: Small peripelvic cysts LEFT kidney. LEFT adrenal mass 22 x 19 mm, containing macroscopic fat, consistent with adrenal myelolipoma. RIGHT adrenal gland unremarkable. Kidneys, ureters, and bladder otherwise normal appearance. Stomach/Bowel: Prior ileocolic resection. Scattered colonic diverticulosis without evidence of diverticulitis. Descending loop colostomy. Stomach and remaining bowel loops normal appearance. Vascular/Lymphatic: Atherosclerotic calcifications aorta without aneurysm. No adenopathy. Scattered pelvic phleboliths. Reproductive: Unremarkable uterus and ovaries Other: No free air or hernia. Ventral surgical wound. Small fluid collection adjacent to ileocolic anastomosis in the RIGHT abdomen 15 x 14 mm. Second fluid collection with enhancing margins at the inferior LEFT paracolic gutter 4.5 x 2.2 cm extending approximately 7 cm length. These could represent sterile or infected postoperative collections. Minimal perisplenic fluid. Scattered stranding of tissue planes in abdomen consistent with surgery. Musculoskeletal: Unremarkable IMPRESSION: Prior ileocolic resection with descending loop colostomy. Small fluid collection adjacent to ileocolic anastomosis in the RIGHT abdomen 15 x 14 mm and a second fluid collection with enhancing margins at the inferior LEFT paracolic gutter 4.5 x 2.2 x ~7 cm , could represent sterile or infected postoperative collections. Cholelithiasis with small amount of pericholecystic fluid, nonspecific, could be related to prior surgery. Bibasilar pleural effusions and atelectasis greater on LEFT. LEFT adrenal myelolipoma 22 x 19 mm. Aortic Atherosclerosis (ICD10-I70.0).  Electronically Signed   By: MLavonia DanaM.D.   On: 06/07/2021 11:32   CT Abdomen Pelvis W Contrast  Result Date: 05/17/2021 CLINICAL DATA:  Bowel obstruction suspected Diverticulitis, complication suspected EXAM: CT ABDOMEN AND PELVIS WITH CONTRAST TECHNIQUE: Multidetector CT imaging of the abdomen and pelvis was performed using the standard protocol following bolus administration of intravenous contrast. RADIATION DOSE REDUCTION: This exam was performed according to the departmental dose-optimization program which includes automated exposure control, adjustment of the mA and/or kV according to patient size and/or use of iterative reconstruction technique. CONTRAST:  1058mOMNIPAQUE IOHEXOL 300 MG/ML  SOLN COMPARISON:  Radiograph 05/16/2021 FINDINGS: Lower chest: LAD stent or calcifications. Mitral annular calcifications. Apical and apical septal endocardial hypoattenuation compatible with prior infarct. No acute abnormality. Hepatobiliary: No focal liver abnormality is seen. The gallbladder is unremarkable. Pancreas: Unremarkable. No pancreatic ductal dilatation or surrounding inflammatory changes. Spleen: Normal in size without focal abnormality. Adrenals/Urinary Tract: There is a 2.5 cm left adrenal nodule, previously measuring 2.3 cm (series 2, image 28). On previous exam in 2020, this measured less than 10 Hounsfield units, consistent with adenoma (this was a noncontrast exam). No hydronephrosis or nephrolithiasis. Left renal sinus cysts. The bladder is decompressed. Stomach/Bowel: There is near diffuse fluid-filled dilatation of the colon with air-fluid levels. There are scattered diverticula without definite diverticulitis. There is some inflammatory stranding along the ascending colon. Decompression at the rectosigmoid junction (series 2, image 78).The appendix is fluid-filled without any adjacent inflammatory stranding. No evidence of appendiceal obstruction/appendicitis.  Vascular/Lymphatic: No  significant vascular findings are present. No enlarged abdominal or pelvic lymph nodes. Reproductive: Unremarkable. Other: Small volume free fluid in the pelvis, likely reactive. No hernia. Musculoskeletal: No acute osseous abnormality. No suspicious lytic or blastic lesions. Multilevel degenerative changes of the spine with trace retrolisthesis at L5-S1. IMPRESSION: Near diffuse fluid-filled dilatation of the colon with air-fluid levels and mild inflammatory stranding along the ascending colon. Findings are consistent severe diarrheal illness with reactive stranding along the ascending colon/developing colitis. There is a transition to decompressed colon at the rectosigmoid junction, distal obstruction cannot be excluded. 2.5 cm left adrenal nodule, minimally increased in size since prior exam in 2020 (previously measured 2.3 cm), imaging characteristics most consistent with adenoma. Recommend biochemical testing to assess for functionality. Electronically Signed   By: Maurine Simmering M.D.   On: 05/17/2021 11:35   DG Chest Port 1 View  Result Date: 06/07/2021 CLINICAL DATA:  Upper abdominal pain this morning. EXAM: PORTABLE CHEST 1 VIEW COMPARISON:  Radiographs 05/23/2021 and 10/13/2019. FINDINGS: 0953 hours. Enteric tube has been removed. There are persistent low lung volumes with a possible small left pleural effusion and associated left lower lobe airspace disease. Minimal right basilar atelectasis is noted. No evidence of edema or pneumothorax. The heart size and mediastinal contours are stable with aortic atherosclerosis. No acute osseous findings are seen. Telemetry leads overlie the chest. IMPRESSION: Persistent left lower lobe airspace disease and probable small left pleural effusion. Electronically Signed   By: Richardean Sale M.D.   On: 06/07/2021 10:02   DG CHEST PORT 1 VIEW  Result Date: 05/23/2021 CLINICAL DATA:  Dyspnea R06.00 (ICD-10-CM) EXAM: PORTABLE CHEST 1 VIEW COMPARISON:  10/13/2019.  FINDINGS: Low lung volumes with mild bibasilar opacities. No visible pleural effusions or pneumothorax. Cardiac silhouette is accentuated by low lung volumes and AP technique. Gastric tube courses below the diaphragm with the tip outside the field of view. IMPRESSION: Low lung volumes with mild bibasilar opacities, which could represent atelectasis, aspiration, and/or pneumonia. Dedicated PA and lateral radiographs could further characterize if clinically indicated. Electronically Signed   By: Margaretha Sheffield M.D.   On: 05/23/2021 11:22   DG Abd 2 Views  Result Date: 05/16/2021 CLINICAL DATA:  Abdominal pain. Gas. Bloating. Nausea and diarrhea for 10 days. EXAM: ABDOMEN - 2 VIEW COMPARISON:  02/03/2019 CT FINDINGS: Upright view of the abdomen and upper pelvis. Supine views of the abdomen and pelvis. The upright view demonstrates no free intraperitoneal air. Fluid levels throughout the colon. Small bowel air-fluid levels are also suspected within the right mid and lower abdomen. Supine images demonstrate mild gaseous distention of the transverse colon at up to 8.9 cm. No gaseous distention of small bowel loops. Paucity of distal gas. Presumed phleboliths in the left hemipelvis. No abnormal abdominal calcifications. Minimal convex right lumbar spine curvature. IMPRESSION: Mild gaseous distension of the colon with colonic and less so small-bowel air-fluid levels. Nonspecific, possibly related to adynamic ileus in the setting of diarrhea and viral gastroenteritis. Distal obstruction could look similar. Electronically Signed   By: Abigail Miyamoto M.D.   On: 05/16/2021 15:27   DG Abd Acute W/Chest  Result Date: 05/17/2021 CLINICAL DATA:  Abdominal pain, nausea EXAM: DG ABDOMEN ACUTE WITH 1 VIEW CHEST COMPARISON:  05/17/2021 FINDINGS: Supine and upright frontal views of the abdomen as well as an upright frontal view of the chest are obtained. The cardiac silhouette is unremarkable. The lungs are clear. There is  diffuse gaseous distention of the colon  with multiple gas fluid levels, unchanged since earlier CT. Please refer to preceding CT evaluation describing wall thickening at the rectosigmoid junction with differential of colitis, scarring, or mass. No free gas in the greater peritoneal sac. No masses or abnormal calcifications. IMPRESSION: 1. Stable marked colonic distension and gas fluid levels. Please refer to preceding CT describing wall thickening and transition point at the rectosigmoid junction. 2. No acute intrathoracic process. Electronically Signed   By: Randa Ngo M.D.   On: 05/17/2021 21:35   ECHOCARDIOGRAM COMPLETE  Result Date: 06/07/2021    ECHOCARDIOGRAM REPORT   Patient Name:   CARRI SPILLERS [facility] Date of Exam: 06/07/2021 Medical Rec #:  [id number]            Height:       62.0 in Accession #:    [id number]           Weight:       188.7 lb Date of Birth:  02/15/1951            BSA:          1.865 m Patient Age:    35 years             BP:           140/72 mmHg Patient Gender: F                    HR:           96 bpm. Exam Location:  Inpatient Procedure: 2D Echo Indications:    CHF, Pleural effusion  History:        Patient has prior history of Echocardiogram examinations, most                 recent 01/25/2020. Cardiomyopathy, Previous Myocardial                 Infarction and CAD; Risk Factors:Dyslipidemia.  Sonographer:    Arlyss Gandy Referring Phys: [id number] [place]  Sonographer Comments: Patient supine (post op). IMPRESSIONS  1. Left ventricular ejection fraction, by estimation, is 55%. The left ventricle has low normal function. Left ventricular endocardial border not optimally defined to evaluate regional wall motion. Left ventricular diastolic parameters are consistent with Grade II diastolic dysfunction (pseudonormalization).  2. Right ventricular systolic function is normal. The right ventricular size is normal. There is moderately elevated pulmonary artery systolic pressure.   3. Moderate pleural effusion in the left lateral region.  4. The mitral valve is grossly normal. Trivial mitral valve regurgitation. Moderate mitral annular calcification.  5. The aortic valve is tricuspid. Aortic valve regurgitation is not visualized. No aortic stenosis is present.  6. The inferior vena cava is dilated in size with >50% respiratory variability, suggesting right atrial pressure of 8 mmHg. Comparison(s): Worse study form prior, LVEF appears to have improved. FINDINGS  Left Ventricle: Left ventricular ejection fraction, by estimation, is 55%. The left ventricle has low normal function. Left ventricular endocardial border not optimally defined to evaluate regional wall motion. The left ventricular internal cavity size was normal in size. There is no left ventricular hypertrophy. Left ventricular diastolic parameters are consistent with Grade II diastolic dysfunction (pseudonormalization). Right Ventricle: The right ventricular size is normal. No increase in right ventricular wall thickness. Right ventricular systolic function is normal. There is moderately elevated pulmonary artery systolic pressure. The tricuspid regurgitant velocity is 3.09 m/s, and with an assumed right atrial pressure of 8 mmHg, the estimated right ventricular systolic  pressure is 46.2 mmHg. Left Atrium: Left atrial size was normal in size. Right Atrium: Right atrial size was normal in size. Pericardium: There is no evidence of pericardial effusion. Presence of epicardial fat layer. Mitral Valve: The mitral valve is grossly normal. Moderate mitral annular calcification. Trivial mitral valve regurgitation. MV peak gradient, 12.5 mmHg. The mean mitral valve gradient is 4.0 mmHg. Tricuspid Valve: The tricuspid valve is normal in structure. Tricuspid valve regurgitation is mild . No evidence of tricuspid stenosis. Aortic Valve: The aortic valve is tricuspid. Aortic valve regurgitation is not visualized. No aortic stenosis is present.  Aortic valve mean gradient measures 3.0 mmHg. Aortic valve peak gradient measures 5.7 mmHg. Aortic valve area, by VTI measures 2.32 cm. Pulmonic Valve: The pulmonic valve was normal in structure. Pulmonic valve regurgitation is not visualized. No evidence of pulmonic stenosis. Aorta: The aortic root, ascending aorta and aortic arch are all structurally normal, with no evidence of dilitation or obstruction. Venous: The inferior vena cava is dilated in size with greater than 50% respiratory variability, suggesting right atrial pressure of 8 mmHg. IAS/Shunts: No atrial level shunt detected by color flow Doppler. Additional Comments: There is a moderate pleural effusion in the left lateral region.  LEFT VENTRICLE PLAX 2D LVIDd:         3.80 cm   Diastology LVIDs:         2.50 cm   LV e' medial:    5.22 cm/s LV PW:         0.90 cm   LV E/e' medial:  18.1 LV IVS:        1.00 cm   LV e' lateral:   5.77 cm/s LVOT diam:     1.80 cm   LV E/e' lateral: 16.4 LV SV:         60 LV SV Index:   32 LVOT Area:     2.54 cm  IVC IVC diam: 2.10 cm LEFT ATRIUM             Index LA diam:        3.30 cm 1.77 cm/m LA Vol (A2C):   55.6 ml 29.81 ml/m LA Vol (A4C):   40.9 ml 21.93 ml/m LA Biplane Vol: 48.1 ml 25.79 ml/m  AORTIC VALVE AV Area (Vmax):    2.11 cm AV Area (Vmean):   2.17 cm AV Area (VTI):     2.32 cm AV Vmax:           119.00 cm/s AV Vmean:          83.900 cm/s AV VTI:            0.257 m AV Peak Grad:      5.7 mmHg AV Mean Grad:      3.0 mmHg LVOT Vmax:         98.80 cm/s LVOT Vmean:        71.500 cm/s LVOT VTI:          0.234 m LVOT/AV VTI ratio: 0.91  AORTA Ao Root diam: 3.20 cm Ao Asc diam:  3.20 cm MITRAL VALVE                TRICUSPID VALVE MV Area (PHT): 2.68 cm     TR Peak grad:   38.2 mmHg MV Area VTI:   1.96 cm     TR Vmax:        309.00 cm/s MV Peak grad:  12.5 mmHg MV Mean grad:  4.0 mmHg  SHUNTS MV Vmax:       1.76 m/s     Systemic VTI:  0.23 m MV Vmean:      87.6 cm/s    Systemic Diam: 1.80 cm MV Decel  Time: 283 msec MV E velocity: 94.60 cm/s MV A velocity: 141.00 cm/s MV E/A ratio:  0.67 Rudean Haskell MD Electronically signed by Rudean Haskell MD Signature Date/Time: 06/07/2021/5:35:12 PM    Final    US Abdomen Limited RUQ (LIVER/GB)  Result Date: 06/07/2021 CLINICAL DATA:  Right upper quadrant pain EXAM: ULTRASOUND ABDOMEN LIMITED RIGHT UPPER QUADRANT COMPARISON:  Same day CT FINDINGS: Gallbladder: Cholelithiasis. Mild nonspecific wall thickening. The gallbladder is nondilated. Negative sonographic Murphy sign. Common bile duct: Diameter: 2.7 mm Liver: No focal lesion identified. Within normal limits in parenchymal echogenicity. Portal vein is patent on color Doppler imaging with normal direction of blood flow towards the liver. Other: Right pleural effusion IMPRESSION: Cholelithiasis.  No evidence of acute cholecystitis. Right pleural effusion Electronically Signed   By: Maurine Simmering M.D.   On: 06/07/2021 12:41    Microbiology: Results for orders placed or performed during the hospital encounter of 06/07/21  Resp Panel by RT-PCR (Flu A&B, Covid) Nasopharyngeal Swab     Status: None   Collection Time: 06/07/21 12:04 PM   Specimen: Nasopharyngeal Swab; Nasopharyngeal(NP) swabs in vial transport medium  Result Value Ref Range Status   SARS Coronavirus 2 by RT PCR NEGATIVE NEGATIVE Final    Comment: (NOTE) SARS-CoV-2 target nucleic acids are NOT DETECTED.  The SARS-CoV-2 RNA is generally detectable in upper respiratory specimens during the acute phase of infection. The lowest concentration of SARS-CoV-2 viral copies this assay can detect is 138 copies/mL. A negative result does not preclude SARS-Cov-2 infection and should not be used as the sole basis for treatment or other patient management decisions. A negative result may occur with  improper specimen collection/handling, submission of specimen other than nasopharyngeal swab, presence of viral mutation(s) within the areas  targeted by this assay, and inadequate number of viral copies(<138 copies/mL). A negative result must be combined with clinical observations, patient history, and epidemiological information. The expected result is Negative.  Fact Sheet for Patients:  EntrepreneurPulse.com.au  Fact Sheet for Healthcare Providers:  IncredibleEmployment.be  This test is no t yet approved or cleared by the [place] FDA and  has been authorized for detection and/or diagnosis of SARS-CoV-2 by FDA under an Emergency Use Authorization (EUA). This EUA will remain  in effect (meaning this test can be used) for the duration of the COVID-19 declaration under Section 564(b)(1) of the Act, 21 U.S.C.section 360bbb-3(b)(1), unless the authorization is terminated  or revoked sooner.       Influenza A by PCR NEGATIVE NEGATIVE Final   Influenza B by PCR NEGATIVE NEGATIVE Final    Comment: (NOTE) The Xpert Xpress SARS-CoV-2/FLU/RSV plus assay is intended as an aid in the diagnosis of influenza from Nasopharyngeal swab specimens and should not be used as a sole basis for treatment. Nasal washings and aspirates are unacceptable for Xpert Xpress SARS-CoV-2/FLU/RSV testing.  Fact Sheet for Patients: EntrepreneurPulse.com.au  Fact Sheet for Healthcare Providers: IncredibleEmployment.be  This test is not yet approved or cleared by the [place] FDA and has been authorized for detection and/or diagnosis of SARS-CoV-2 by FDA under an Emergency Use Authorization (EUA). This EUA will remain in effect (meaning this test can be used) for the duration of the COVID-19 declaration under Section 564(b)(1) of the Act, 21 U.S.C.  section 360bbb-3(b)(1), unless the authorization is terminated or revoked.  Performed at [facility], [place] [address], [place], [place] [address]     Labs: CBC: Recent Labs  Lab 06/07/21 [phone]  06/07/21 0955 06/08/21 0412  WBC 11.8*  --  10.7*  NEUTROABS 8.9*  --   --   HGB 11.4* 11.6* 10.3*  HCT 35.0* 34.0* 32.0*  MCV 93.6  --  95.2  PLT 735*  --  710*   Basic Metabolic Panel: Recent Labs  Lab 06/07/21 0931 06/07/21 0955 06/08/21 0412 06/09/21 0410  NA 137 140 136 137  K 2.9* 2.9* 3.3* 3.2*  CL 101 102 102 101  CO2 26  --  26 28  GLUCOSE 124* 125* 119* 115*  BUN 7* 5* 9 10  CREATININE 0.62 0.60 0.66 0.65  CALCIUM 8.9  --  8.6* 8.8*  MG 1.9  --   --   --    Liver Function Tests: Recent Labs  Lab 06/07/21 0931 06/08/21 0412 06/09/21 0410  AST 55* 39 44*  ALT 66* 52* 62*  ALKPHOS 73 64 68  BILITOT 0.3 0.2* 0.4  PROT 7.5 6.9 7.1  ALBUMIN 2.3* 2.0* 2.1*   CBG: No results for input(s): GLUCAP in the last 168 hours.  Discharge time spent: less than 30 minutes.  Signed: Marylu Lund, MD [facility] 06/09/2021

## 2021-06-09 NOTE — TOC CM/SW Note (Signed)
?  Transition of Care (TOC) Screening Note ? ? ?Patient Details  ?Name: Valerie Fields ?Date of Birth: October 17, 1950 ? ? ?Transition of Care (TOC) CM/SW Contact:    ?Ross Ludwig, LCSW ?Phone Number: ?06/09/2021, 1:08 PM ? ? ? ?Transition of Care Department [facility]) has reviewed patient and no TOC needs have been identified at this time. We will continue to monitor patient advancement through interdisciplinary progression rounds. If new patient transition needs arise, please place a TOC consult. ?  ?

## 2021-06-14 ENCOUNTER — Other Ambulatory Visit: Payer: Self-pay

## 2021-06-14 ENCOUNTER — Ambulatory Visit (HOSPITAL_COMMUNITY)
Admission: RE | Admit: 2021-06-14 | Discharge: 2021-06-14 | Disposition: A | Discharge status: Home / Self Care | Payer: Medicare HMO | Source: Ambulatory Visit | Attending: Nurse Practitioner | Admitting: Nurse Practitioner

## 2021-06-14 DIAGNOSIS — T8189XD Other complications of procedures, not elsewhere classified, subsequent encounter: Secondary | ICD-10-CM | POA: Diagnosis not present

## 2021-06-14 DIAGNOSIS — L24B1 Irritant contact dermatitis related to digestive stoma or fistula: Secondary | ICD-10-CM | POA: Diagnosis not present

## 2021-06-14 DIAGNOSIS — K9400 Colostomy complication, unspecified: Secondary | ICD-10-CM | POA: Diagnosis not present

## 2021-06-14 DIAGNOSIS — Z933 Colostomy status: Secondary | ICD-10-CM | POA: Insufficient documentation

## 2021-06-14 NOTE — Progress Notes (Signed)
[facility]  ? ?Reason for visit:  ?LUQ colostomy ?Midline abdominal wound ?HPI:  ?Diverticulitis with rupture resulting in colostomy and open midline wound with NPWT.   ?Recessed stoma in deep creasing. Frequent pouch leaks.  ?ROS  ?Review of Systems  ?Constitutional:  Positive for activity change.  ?     Decreased appetite since prior to surgery.   ?Gastrointestinal:   ?     LUQ recessed colostomy ?Creasing at umbilicus ?Midline abdominal surgical wound with NPWT in place.  Changed dressing and pouch today.   ?Skin:  Positive for wound.  ?All other systems reviewed and are negative. ?Vital signs:  ?BP 125/83 (BP Location: Right Arm)   Pulse 100   Temp 98.6 ?F (37 ?C) (Oral)   Resp 18   SpO2 95%  ?Exam:  ?Physical Exam ?Vitals reviewed.  ?Constitutional:   ?   Appearance: Normal appearance.  ?Abdominal:  ?   Palpations: Abdomen is soft.  ?   Comments: Midline open abdominal wound, improving.  [place] nurse changing on MOn.Wed.Fri  ?Skin: ?   General: Skin is warm and dry.  ?Neurological:  ?   Mental Status: She is alert and oriented to person, place, and time.  ?Psychiatric:     ?   Mood and Affect: Mood normal.     ?   Thought Content: Thought content normal.  ?  ?Stoma type/location:  LUQ colostomy ?Stomal assessment/size:  oval 2 cm x 6 cm with separation circumferentially, extends 1 cm  ?Peristomal assessment:  erythema circumferentially  midline open wound.  Must pouch concurrent with NPWT dressing.  ?Treatment options for stomal/peristomal skin: stoma powder to separation, stoma powder and skin prep with barrier ring to peristomal skin to fill in defect and protect skin.  Flexible convex pouch with ostomy belt ?Output: soft brown stool ?Ostomy pouching: 1pc.convex with barrier ring and ostomy belt ?Education provided:  Here with friend, Arbie Cookey who is a Marine scientist and support at home. We perform pouch change.  Pouch has leaked and been taped to reinforce.  I discuss not taping once pouch has leaked as  this can lead to skin breakdown.  ?Skin cleansed  demonstrate the use of powder and no sting skin prep as well as barrier ring  (Requires 1 1/2 rings)  ?[place] Nurse wound follow up ?Wound type:midline abdominal wound ?Measurement: 16 cm x 4.2 cm x 1 cm  ?Wound JIR:CVELF red, moist ?Drainage (amount, consistency, odor) minimal serosanguinous  no odor ?Periwound:LUQ colostomy ?Dressing procedure/placement/frequency: Change MOn/Wed/Fri  Bridging trac pad to flat part of abdomen (not in umbilical creasing) to promote seal ?  ? ?  ?Impression/dx  ?Recessed stoma ?Discussion  ?Stoma powder to stomal separation, barrier ring to build up/promote seal ?Plan  ?See in one week.  ? ? ? ?Visit time: 70 minutes.  ? ?Domenic Moras FNP-BC ? ?  ?

## 2021-06-18 NOTE — Discharge Instructions (Signed)
See back in one week 

## 2021-06-21 ENCOUNTER — Ambulatory Visit (HOSPITAL_COMMUNITY)
Admission: RE | Admit: 2021-06-21 | Discharge: 2021-06-21 | Disposition: A | Discharge status: Home / Self Care | Payer: Medicare HMO | Source: Ambulatory Visit | Attending: Nurse Practitioner | Admitting: Nurse Practitioner

## 2021-06-21 ENCOUNTER — Other Ambulatory Visit: Payer: Self-pay

## 2021-06-21 DIAGNOSIS — Z433 Encounter for attention to colostomy: Secondary | ICD-10-CM | POA: Insufficient documentation

## 2021-06-21 DIAGNOSIS — K9400 Colostomy complication, unspecified: Secondary | ICD-10-CM

## 2021-06-21 DIAGNOSIS — L24B1 Irritant contact dermatitis related to digestive stoma or fistula: Secondary | ICD-10-CM | POA: Diagnosis not present

## 2021-06-21 NOTE — Progress Notes (Signed)
[facility]  ? ?Reason for visit:  ?LUQ colostomy ?Midline abdominal wound.  Just left surgeon office and VAC has been discontinued.  Will use twice daily NS gauze dressing.  ?HPI:  ?Perforated diverticulum with LUQ colostomy ?ROS  ?Review of Systems  ?Constitutional:  Positive for fatigue.  ?Gastrointestinal:  Positive for abdominal pain.  ?     Midline surgical wound, VAC discontinued today  ?Skin:  Positive for rash.  ?All other systems reviewed and are negative. ?Vital signs:  ?BP (!) 113/59 (BP Location: Right Arm)   Pulse 84   Temp 98.3 ?F (36.8 ?C) (Oral)   Resp 18   SpO2 95%  ?Exam:  ?Physical Exam ?Vitals reviewed.  ?Constitutional:   ?   Appearance: Normal appearance.  ?Abdominal:  ?   Palpations: Abdomen is soft.  ?   Comments: Midline abdominal wound, VAC discontinued today.   ?Neurological:  ?   Mental Status: She is alert and oriented to person, place, and time.  ?Psychiatric:     ?   Mood and Affect: Mood normal.  ?  ?Stoma type/location:  LUQ colostomy ?Stomal assessment/size: 1 3/8" recessed stoma with deep creasing at 3 and 9 o'clock ?Peristomal assessment:  erythema present  powder to stomal separation, skin prep and barrier ring to 1 piece convex pouch and ostomy belt.   ?Treatment options for stomal/peristomal skin: powder and skin prep  barrier ring and convex pouch ?Output: soft brown stool ?Ostomy pouching: 1pc convex ?Education provided:  POuching is improving.  Linton Rump is here today.  ? ?  ?Impression/dx  ?Recessed stoma ?Healing midline surgical wound.  ?Discussion  ?Continue pouching as before, NS moist gauze to midline wound.  ?Plan  ?See back as needed ? ? ? ?Visit time: 55 minutes.  ? ?Valerie Moras FNP-BC ? ?  ?

## 2021-06-21 NOTE — Discharge Instructions (Signed)
Work with home health nurse Monday on ostomy care (sent home with pattern) ? 1 piece flexible convex pouch.  PRODUCT NUMBER [id number] ?Stoma powder and skin prep to skin around stoma and pink tissue inside opening.  ?Barrier ring and ostomy belt.  ?Goal:  Change twice weekly ?

## 2021-06-28 ENCOUNTER — Ambulatory Visit (HOSPITAL_COMMUNITY)
Admission: RE | Admit: 2021-06-28 | Discharge: 2021-06-28 | Disposition: A | Discharge status: Home / Self Care | Payer: Medicare HMO | Source: Ambulatory Visit | Attending: Nurse Practitioner | Admitting: Nurse Practitioner

## 2021-06-28 DIAGNOSIS — Y738 Miscellaneous gastroenterology and urology devices associated with adverse incidents, not elsewhere classified: Secondary | ICD-10-CM | POA: Diagnosis not present

## 2021-06-28 DIAGNOSIS — Y833 Surgical operation with formation of external stoma as the cause of abnormal reaction of the patient, or of later complication, without mention of misadventure at the time of the procedure: Secondary | ICD-10-CM | POA: Diagnosis not present

## 2021-06-28 DIAGNOSIS — S31105A Unspecified open wound of abdominal wall, periumbilic region without penetration into peritoneal cavity, initial encounter: Secondary | ICD-10-CM | POA: Insufficient documentation

## 2021-06-28 DIAGNOSIS — R21 Rash and other nonspecific skin eruption: Secondary | ICD-10-CM | POA: Insufficient documentation

## 2021-06-28 DIAGNOSIS — K9400 Colostomy complication, unspecified: Secondary | ICD-10-CM

## 2021-06-28 DIAGNOSIS — T8189XD Other complications of procedures, not elsewhere classified, subsequent encounter: Secondary | ICD-10-CM | POA: Diagnosis not present

## 2021-06-28 DIAGNOSIS — Z433 Encounter for attention to colostomy: Secondary | ICD-10-CM | POA: Diagnosis present

## 2021-06-28 NOTE — Progress Notes (Signed)
[facility]  ? ?Reason for visit:  ?LUQ colostomy ?HPI:  ?Perforated diverticulum ?ROS  ?Review of Systems  ?Constitutional:   ?     Gaining strength every day  ?Gastrointestinal:   ?     LLQ colostomy  ?Skin:  Positive for wound.  ?     Midline abdominal wound ?Stomal separation  ?All other systems reviewed and are negative. ?Vital signs:  ?BP 124/67   Pulse 93   Temp 98.1 ?F (36.7 ?C) (Oral)   Resp 18   SpO2 96%  ?Exam:  ?Physical Exam ?Vitals reviewed.  ?Constitutional:   ?   Appearance: Normal appearance.  ?Abdominal:  ?   General: The ostomy site is clean.  ?   Palpations: Abdomen is soft.  ? ? ?   Comments: Stomal separation ?Midline abdominal wound, healing  ?Skin: ?   Findings: Rash present.  ?   Comments: Peristomal irritation  ?Neurological:  ?   Mental Status: She is alert and oriented to person, place, and time.  ?Psychiatric:     ?   Mood and Affect: Mood normal.  ?  ?Stoma type/location:  LUQ colostomy ?Stomal assessment/size:  1 3/8" recessed stoma ?Peristomal assessment:  separation remains, filling defect with stoma powder and barrier ring/convex pouch ?Treatment options for stomal/peristomal skin: \convex pouch, barrier ring stoma powder and skin prep ?Output: soft brown stool ?Ostomy pouching: 1pc. Convex .  ?Education provided:  patient performing pouch change with [facility] nurse and is gaining confidence.  She has supplies ? ?  ?Impression/dx  ?Colostomy complication, recessed stoma ?Healing midline surgical wound ?Discussion  ?See back as needed ?Plan  ?Will continue to work on teaching with [facility].  ? ? ? ?Visit time: 50 minutes.  ? ?Domenic Moras FNP-BC ? ?  ?

## 2021-07-01 NOTE — Discharge Instructions (Signed)
Continue to work with [facility] ?

## 2021-07-12 ENCOUNTER — Ambulatory Visit (HOSPITAL_COMMUNITY)
Admission: RE | Admit: 2021-07-12 | Discharge: 2021-07-12 | Disposition: A | Discharge status: Home / Self Care | Payer: Medicare HMO | Source: Ambulatory Visit | Attending: Nurse Practitioner | Admitting: Nurse Practitioner

## 2021-07-12 DIAGNOSIS — K9409 Other complications of colostomy: Secondary | ICD-10-CM | POA: Insufficient documentation

## 2021-07-12 DIAGNOSIS — K9400 Colostomy complication, unspecified: Secondary | ICD-10-CM | POA: Diagnosis not present

## 2021-07-12 NOTE — Progress Notes (Signed)
[facility]  ? ?Reason for visit:  ?LUQ colostomy ?HPI:  ?Perforated diverticulum ? ?ROS  ?Review of Systems  ?Gastrointestinal:   ?     Midline surgical wound healing ?Recessed colostomy with stomal separation  ?Skin:  Positive for color change.  ?     Erythema around stoma  ?Psychiatric/Behavioral: Negative.    ?All other systems reviewed and are negative. ?Vital signs:  ?BP 123/64 (BP Location: Right Arm)   Pulse 84   Temp 97.8 ?F (36.6 ?C) (Oral)   Resp 18   SpO2 98%  ?Exam:  ?Physical Exam ?Vitals reviewed.  ?Constitutional:   ?   Appearance: Normal appearance.  ?Abdominal:  ?   Comments: Midline surgical wound, healing  ?Neurological:  ?   Mental Status: She is alert and oriented to person, place, and time.  ?Psychiatric:     ?   Mood and Affect: Mood normal.  ?  ?Stoma type/location:  LUQ colostomy ?Stomal assessment/size:  1 1/2" recessed stoma ?Peristomal assessment:  separation remains.  Patient gaining confidence in self care ?Treatment options for stomal/peristomal skin: 1 piece convex with barrier ring ?Output: soft brown stool ?Ostomy pouching: 1pc convex ?Education provided:  Patient has performed pouch change several times and feels she is competent in this.  Due to separation, she is still anxious with self care.  ? ?  ?Impression/dx  ?Colostomy complication ? ?Discussion  ?See back as needed.  Call clinic or surgeon if separation worsens or no stomal output.  ?Plan  ?See back as needed.  ? ? ? ?Visit time: 45 minutes.  ? ?Domenic Moras FNP-BC ? ?  ?

## 2021-07-14 ENCOUNTER — Other Ambulatory Visit: Payer: Self-pay | Admitting: Cardiovascular Disease

## 2021-07-15 NOTE — Discharge Instructions (Signed)
See back as needed  ?Call clinic or surgeon is stomal separation increases or no stool output.  ?

## 2021-07-23 ENCOUNTER — Telehealth: Payer: Self-pay | Admitting: Cardiovascular Disease

## 2021-07-23 NOTE — Telephone Encounter (Signed)
See MyChart message from 07/23/21 for additional information. ?

## 2021-07-23 NOTE — Telephone Encounter (Signed)
New Message: ? ? ? ? ?Valerie Fields from [facility] is calling. She need s to find out if patient is still supposed to be taking Clopidogrel?   ?

## 2021-07-23 NOTE — Telephone Encounter (Signed)
Spoke with [place] from [place].  She said they have been speaking w the patient in regard to her medications and she told the pharmacist there she has been throwing away her Plavix because she was told at the ER she didn't need to take it anymore. ? ?I let them know that Dr. Angelena Form has not discontinued Plavix and I left a message for the patient to call back to discuss. ? ?

## 2021-08-05 ENCOUNTER — Other Ambulatory Visit: Payer: Self-pay | Admitting: Family Medicine

## 2021-08-05 DIAGNOSIS — Z1231 Encounter for screening mammogram for malignant neoplasm of breast: Secondary | ICD-10-CM

## 2021-08-08 ENCOUNTER — Ambulatory Visit
Admission: RE | Admit: 2021-08-08 | Discharge: 2021-08-08 | Disposition: A | Discharge status: Home / Self Care | Payer: Medicare HMO | Source: Ambulatory Visit | Attending: Family Medicine | Admitting: Family Medicine

## 2021-08-08 DIAGNOSIS — Z1231 Encounter for screening mammogram for malignant neoplasm of breast: Secondary | ICD-10-CM

## 2021-08-12 ENCOUNTER — Ambulatory Visit: Payer: Medicare HMO | Admitting: Cardiovascular Disease

## 2021-08-31 IMAGING — MG MM DIGITAL SCREENING BILAT W/ TOMO AND CAD
8 series · 8 of 24 positions shown · non-contrast
Comparison: Previous exam(s).

CLINICAL DATA: Screening.

EXAM:
DIGITAL SCREENING BILATERAL MAMMOGRAM WITH TOMOSYNTHESIS AND CAD
TECHNIQUE: Bilateral screening digital craniocaudal and mediolateral oblique
mammograms were obtained. Bilateral screening digital breast
tomosynthesis was performed. The images were evaluated with
computer-aided detection.

[L MLO synth-2D]
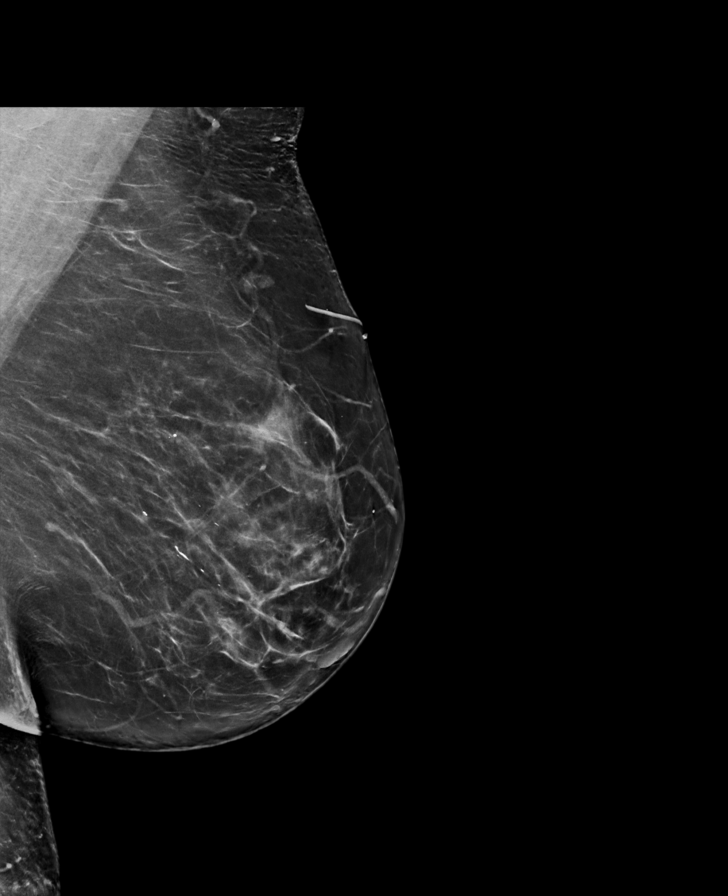

[R CC synth-2D]
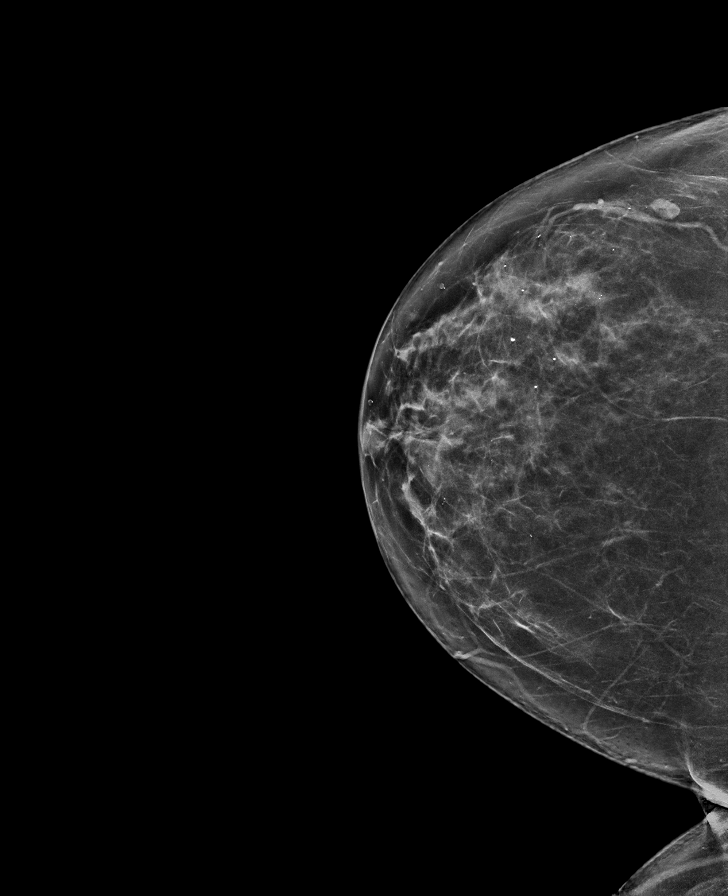

[L CC synth-2D]
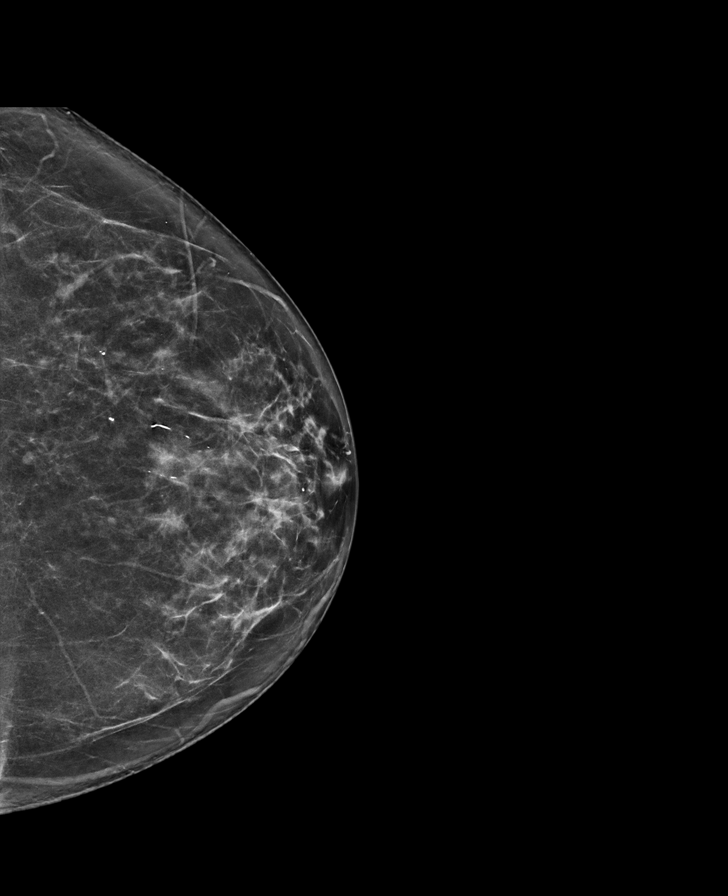

[R MLO synth-2D]
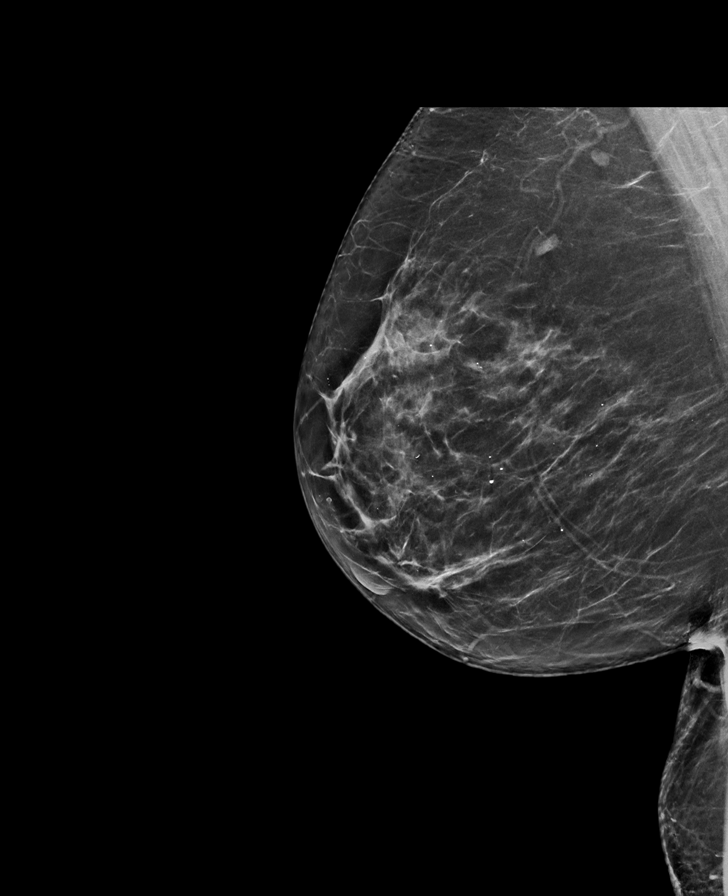

[R MLO tomo · tomo slice 39/76.0]
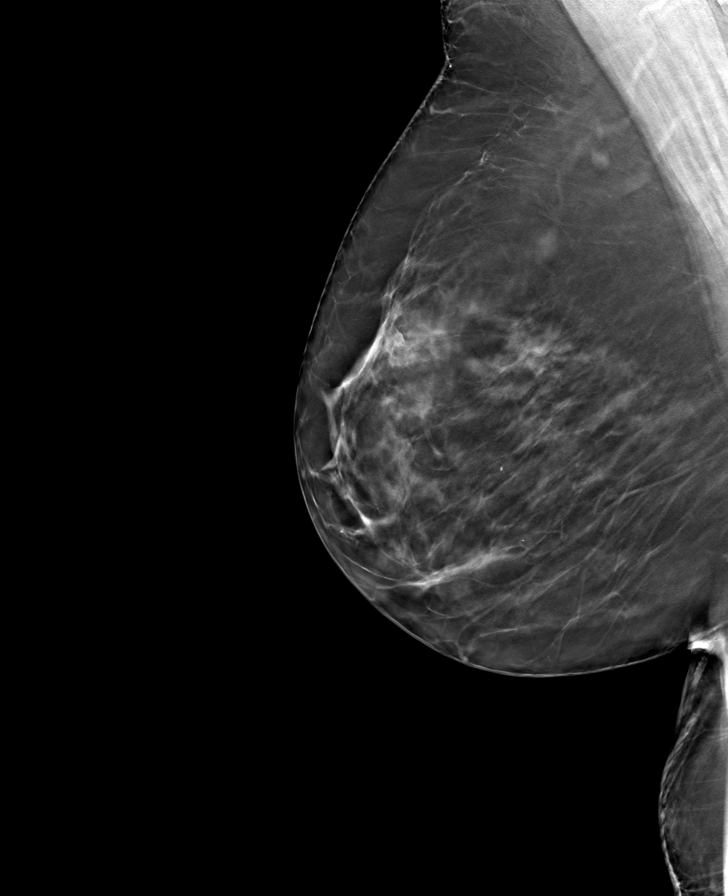

[L CC tomo · tomo slice 35/70.0]
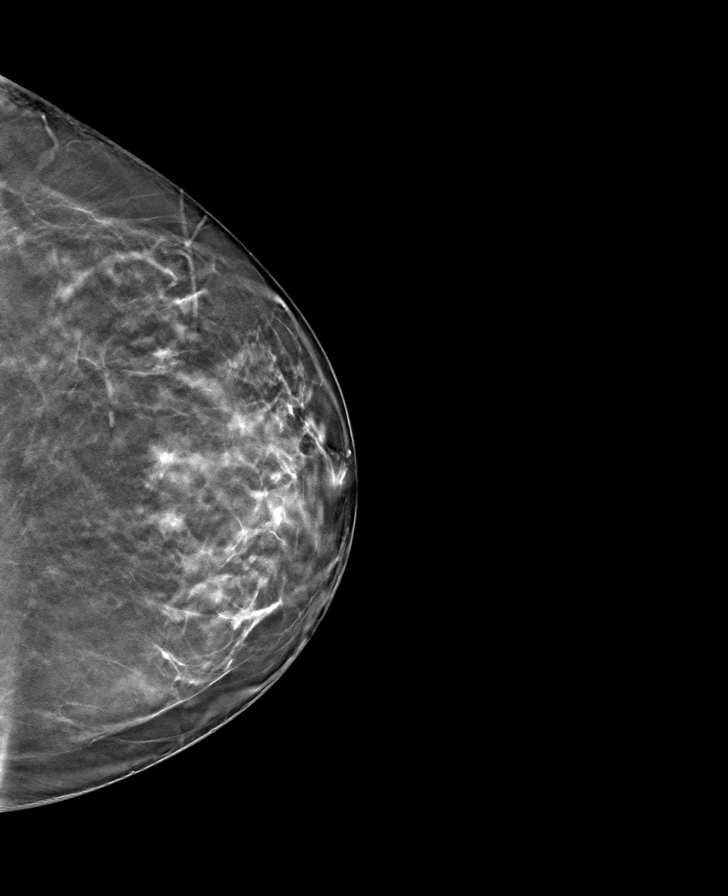

[L MLO tomo · tomo slice 43/84.0]
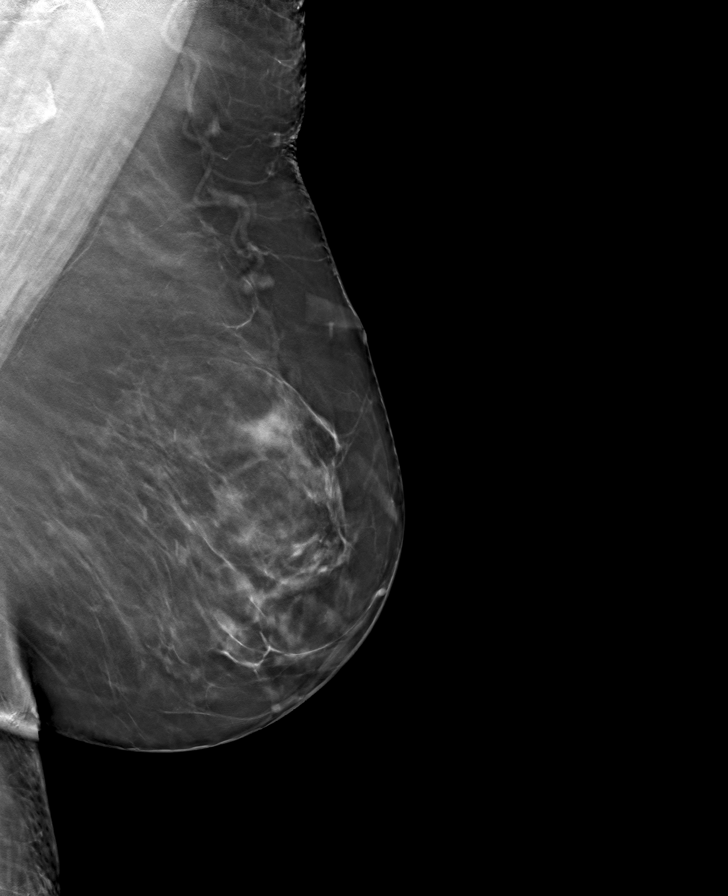

[R CC tomo · tomo slice 39/76.0]
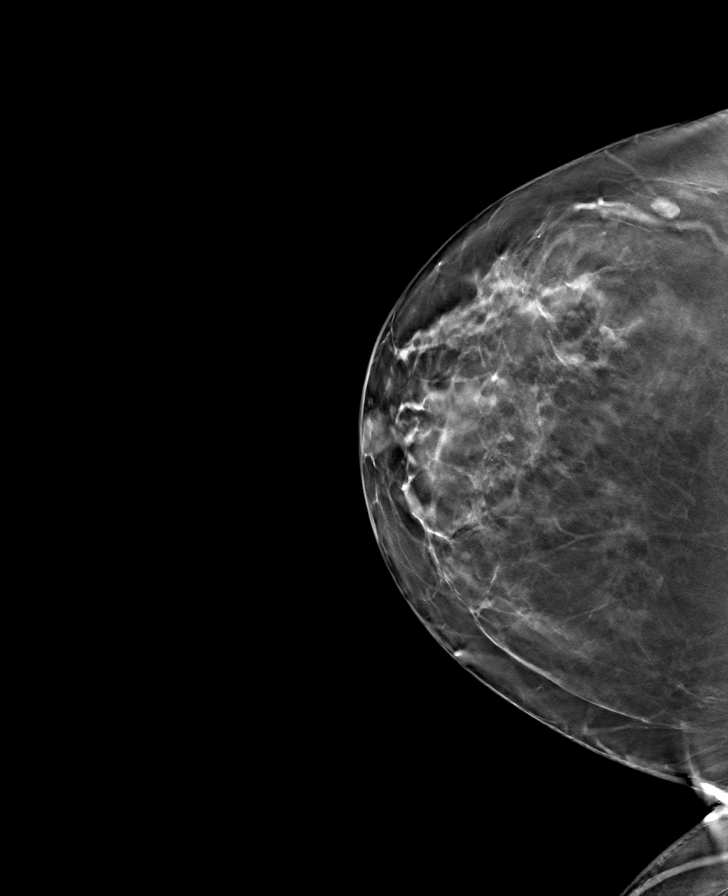

[8 of 24 positions shown; findings below may reference images not displayed]

ACR Breast Density Category b: There are scattered areas of
fibroglandular density.
FINDINGS: There are no findings suspicious for malignancy. The images were
evaluated with computer-aided detection.
IMPRESSION: No mammographic evidence of malignancy. A result letter of this
screening mammogram will be mailed directly to the patient.

RECOMMENDATION:
Screening mammogram in one year. (Code:WJ-[id number])

BI-RADS CATEGORY  1: Negative.

## 2021-09-02 ENCOUNTER — Ambulatory Visit (HOSPITAL_COMMUNITY)
Admission: RE | Admit: 2021-09-02 | Discharge: 2021-09-02 | Disposition: A | Discharge status: Home / Self Care | Payer: Medicare HMO | Source: Ambulatory Visit | Attending: Family Medicine | Admitting: Family Medicine

## 2021-09-02 DIAGNOSIS — T8189XA Other complications of procedures, not elsewhere classified, initial encounter: Secondary | ICD-10-CM | POA: Diagnosis not present

## 2021-09-02 DIAGNOSIS — K5900 Constipation, unspecified: Secondary | ICD-10-CM | POA: Diagnosis not present

## 2021-09-02 DIAGNOSIS — L929 Granulomatous disorder of the skin and subcutaneous tissue, unspecified: Secondary | ICD-10-CM | POA: Insufficient documentation

## 2021-09-02 DIAGNOSIS — K9400 Colostomy complication, unspecified: Secondary | ICD-10-CM | POA: Diagnosis not present

## 2021-09-02 DIAGNOSIS — L24B1 Irritant contact dermatitis related to digestive stoma or fistula: Secondary | ICD-10-CM | POA: Diagnosis not present

## 2021-09-02 NOTE — Progress Notes (Signed)
[facility]   Reason for visit:  LUQ colostomy, recessed.  Decreasing in size HPI:  Perforated diverticulum ROS  Review of Systems  Gastrointestinal:  Positive for constipation.       LUQ colostomy  recessed stoma  Skin:  Positive for wound.       Midline surgical wound has healed and has pink smooth scar.  All other systems reviewed and are negative. Vital signs:  BP 129/69 (BP Location: Right Arm)   Pulse 72   Temp 98.1 F (36.7 C) (Oral)   Resp 17   SpO2 98%  Exam:  Physical Exam Vitals reviewed.  Constitutional:      Appearance: Normal appearance.  Abdominal:     Palpations: Abdomen is soft.     Comments: Ostomy in LUQ  Midline abdominal scar  Skin:    General: Skin is warm and dry.  Neurological:     Mental Status: She is alert and oriented to person, place, and time.  Psychiatric:        Mood and Affect: Mood normal.     Comments: Concerned about upcoming travel trip and pouch leaks.     Stoma type/location:  LUQ recessed stoma, 1" in diameter Stomal assessment/size:  1" recessed, os at 10 o'clock. Peristomal assessment:  Peristomal skin is erythematous and intact Treatment options for stomal/peristomal skin: Experiencing leaks due to thick stool and pancaking.  Pouch would "blow off" she relays.  Is traveling to [place] soon and wants to ensure she has the best fit possible.  Is concerned about decrease in stomal size.   Began taking miralax daily and stool has thinned in consistency Output: soft brown stool Ostomy pouching: 1pc. Convex with barrier ring and belt.  She secures pouch with barrier strips.  She is in the appropriate pouch.  She needs to continue taking miralax or colace to soften stool and keep it emptying into the pouch.  Education provided:  During pouch change, hypergranulation tissue is noted at 6 oclock.  Bleeds at every pouch change.  SIlver nitrate is applied tothis area.  Some stinging reported.  Irrigated with NS and this resolved.   Pouch as usual with barrier ring, convex pouch and belt.     Impression/dx  Granuloma at 6 o'clock, applied silver nitrate constipation Discussion  Granuloma will turn gray and may slough. May require more than one application.  Continue miralax.  She is drinking a lot of water and may try colace to soften stool with laxative effect.  Plan  See back as needed.  Will be traveling soon and cannot make a follow up appointment.     Visit time: 50 minutes.   Maple Hudson FNP-BC

## 2021-09-03 NOTE — Discharge Instructions (Signed)
Silver nitrate applied to hypergranulation.   Area may turn gray and slough away For travel, keep stool thin enough that pouch can empty and stool does not collect at opening.

## 2021-10-04 ENCOUNTER — Ambulatory Visit (HOSPITAL_COMMUNITY)
Admission: RE | Admit: 2021-10-04 | Discharge: 2021-10-04 | Disposition: A | Discharge status: Home / Self Care | Payer: Medicare HMO | Source: Ambulatory Visit | Attending: Nurse Practitioner | Admitting: Nurse Practitioner

## 2021-10-04 ENCOUNTER — Telehealth: Payer: Self-pay

## 2021-10-04 ENCOUNTER — Encounter (HOSPITAL_COMMUNITY): Payer: Self-pay | Admitting: Nurse Practitioner

## 2021-10-04 DIAGNOSIS — Z433 Encounter for attention to colostomy: Secondary | ICD-10-CM | POA: Insufficient documentation

## 2021-10-04 DIAGNOSIS — L929 Granulomatous disorder of the skin and subcutaneous tissue, unspecified: Secondary | ICD-10-CM | POA: Diagnosis not present

## 2021-10-04 DIAGNOSIS — K9400 Colostomy complication, unspecified: Secondary | ICD-10-CM | POA: Diagnosis not present

## 2021-10-04 MED ORDER — SILVER NITRATE-POT NITRATE 75-25 % EX MISC
1.0000 | Freq: Every day | CUTANEOUS | Status: DC
  Filled 2021-10-04 (×2): qty 1

## 2021-10-04 NOTE — Telephone Encounter (Signed)
   Pre-operative Risk Assessment    Patient Name: Valerie Fields  DOB: Aug 06, 1950 MRN: [id number]      Request for Surgical Clearance    Procedure:   Colonoscopy  Date of Surgery:  Clearance 10/29/21                                 Surgeon:  Dr. Clarene Essex Surgeon's Group or Practice Name:  [facility]  Phone number:  [phone] Fax number:  [phone]   Type of Clearance Requested:   - Medical  - Pharmacy:  Hold Aspirin and Clopidogrel (Plavix)     Type of Anesthesia:   Propofol   Additional requests/questions:   None  Signed, Jenilyn Magana   10/04/2021, 3:49 PM

## 2021-10-04 NOTE — Telephone Encounter (Signed)
   Name: Valerie Fields  DOB: 11/04/1950  MRN: [id number]  Primary Cardiologist: Lauree Chandler, MD   Preoperative team, please contact this patient and set up a phone call appointment for further preoperative risk assessment. Please obtain consent and complete medication review. Thank you for your help.  I confirm that guidance regarding antiplatelet and oral anticoagulation therapy has been completed and, if necessary, noted below.  Per office protocol, patient may hold Plavix for 5 days prior to procedure.  Ideally, patient would continue aspirin throughout the perioperative period.  However, if aspirin needs to be held, patient may hold aspirin for 7 days prior to procedure.  Please resume aspirin and Plavix as soon as possible postprocedure, the discretion of the surgeon.  Lenna Sciara, NP 10/04/2021, 4:58 PM [place] [address] [place], [place] [address]

## 2021-10-04 NOTE — Progress Notes (Signed)
[facility]   Reason for visit:  LUQ colostomy- recently traveled to [place]  and used more supplies than usual due to travel, dietary changes, and recessed stoma. Hypergranulation to edges of stoma.  HPI:  Perforated diverticulum with end colostomy. ROS  Review of Systems  All other systems reviewed and are negative.  Vital signs:  BP (!) 124/59 (BP Location: Right Arm)   Pulse 77   Temp 98 F (36.7 C) (Oral)   Resp 18   SpO2 96%  Exam:  Physical Exam  Stoma type/location:  LUQ colostomy, recessed, increase in leaks. Stomal assessment/size:  7/8" with hypergranulation to distal and proximal stomal edge.  Will silver nitrate this area as it bleeds and contributes to leaking.  It is just at skin level.  Peristomal assessment:  See above  recessed stoma with hypergranulation to stomal edge/  Treatment options for stomal/peristomal skin: silver nitrate to stomal edges.  Neutralized cauterization with NS.  Minimal stinging reported.  Output: soft brown stool.  Keeps stool very thin as thickness was an issue in recent visits.  Empties more frequently and prefers the stool to be thin.  Ostomy pouching: 1pc./2pc.  Education provided:  Discussed color change with Silver nitrate.      Impression/dx  Hypergranulation Recessed stoma Discussion  See back as needed.  Plan  Eager for reversal, no changes to pouching.  NEw pattern for stoma size provided today.      Visit time: 60 minutes.   Domenic Moras FNP-BC

## 2021-10-07 ENCOUNTER — Telehealth: Payer: Medicare HMO

## 2021-10-07 NOTE — Discharge Instructions (Signed)
No changes.  Stoma color darkened to areas we applied silver nitrate to. Tissue may slough away.

## 2021-10-08 NOTE — Telephone Encounter (Signed)
Left message for the pt to call the office to schedule a tele visit for pre op clearance.

## 2021-10-09 NOTE — Telephone Encounter (Signed)
Patient was returning call. Please advise ?

## 2021-10-10 ENCOUNTER — Telehealth: Payer: Self-pay | Admitting: *Deleted

## 2021-10-10 NOTE — Telephone Encounter (Signed)
Pt is agreeable to plan of care for tele visit 10/17/21 @ 3 pm. Med rec and consent are done.      Patient Consent for Virtual Visit        Valerie Fields has provided verbal consent on 10/10/2021 for a virtual visit (video or telephone).   CONSENT FOR VIRTUAL VISIT FOR:  Valerie Fields  By participating in this virtual visit I agree to the following:  I hereby voluntarily request, consent and authorize [facility] and its employed or contracted physicians, physician assistants, nurse practitioners or other licensed health care professionals (the Practitioner), to provide me with telemedicine health care services (the "Services") as deemed necessary by the treating Practitioner. I acknowledge and consent to receive the Services by the Practitioner via telemedicine. I understand that the telemedicine visit will involve communicating with the Practitioner through live audiovisual communication technology and the disclosure of certain medical information by electronic transmission. I acknowledge that I have been given the opportunity to request an in-person assessment or other available alternative prior to the telemedicine visit and am voluntarily participating in the telemedicine visit.  I understand that I have the right to withhold or withdraw my consent to the use of telemedicine in the course of my care at any time, without affecting my right to future care or treatment, and that the Practitioner or I may terminate the telemedicine visit at any time. I understand that I have the right to inspect all information obtained and/or recorded in the course of the telemedicine visit and may receive copies of available information for a reasonable fee.  I understand that some of the potential risks of receiving the Services via telemedicine include:  Delay or interruption in medical evaluation due to technological equipment failure or disruption; Information transmitted may not be  sufficient (e.g. poor resolution of images) to allow for appropriate medical decision making by the Practitioner; and/or  In rare instances, security protocols could fail, causing a breach of personal health information.  Furthermore, I acknowledge that it is my responsibility to provide information about my medical history, conditions and care that is complete and accurate to the best of my ability. I acknowledge that Practitioner's advice, recommendations, and/or decision may be based on factors not within their control, such as incomplete or inaccurate data provided by me or distortions of diagnostic images or specimens that may result from electronic transmissions. I understand that the practice of medicine is not an exact science and that Practitioner makes no warranties or guarantees regarding treatment outcomes. I acknowledge that a copy of this consent can be made available to me via my patient portal ([facility]), or I can request a printed copy by calling the office of [facility].    I understand that my insurance will be billed for this visit.   I have read or had this consent read to me. I understand the contents of this consent, which adequately explains the benefits and risks of the Services being provided via telemedicine.  I have been provided ample opportunity to ask questions regarding this consent and the Services and have had my questions answered to my satisfaction. I give my informed consent for the services to be provided through the use of telemedicine in my medical care

## 2021-10-10 NOTE — Telephone Encounter (Signed)
Pt is agreeable to plan of care for tele visit 10/17/21 @ 3 pm. Med rec and consent are done.

## 2021-10-11 ENCOUNTER — Encounter (HOSPITAL_COMMUNITY): Payer: Self-pay | Admitting: Nurse Practitioner

## 2021-10-17 ENCOUNTER — Ambulatory Visit (INDEPENDENT_AMBULATORY_CARE_PROVIDER_SITE_OTHER): Payer: Medicare HMO | Admitting: Nurse Practitioner

## 2021-10-17 ENCOUNTER — Encounter: Payer: Self-pay | Admitting: Nurse Practitioner

## 2021-10-17 DIAGNOSIS — Z01810 Encounter for preprocedural cardiovascular examination: Secondary | ICD-10-CM | POA: Diagnosis not present

## 2021-10-17 NOTE — Progress Notes (Signed)
Virtual Visit via Telephone Note   Because of Valerie Fields's co-morbid illnesses, she is at least at moderate risk for complications without adequate follow up.  This format is felt to be most appropriate for this patient at this time.  The patient did not have access to video technology/had technical difficulties with video requiring transitioning to audio format only (telephone).  All issues noted in this document were discussed and addressed.  No physical exam could be performed with this format.  Please refer to the patient's chart for her consent to telehealth for [facility].  Evaluation Performed:  Preoperative cardiovascular risk assessment _____________   Date:  10/17/2021   Patient ID:  Valerie, Fields 01/27/1951, MRN [id number] Patient Location:  Home Provider location:   Office  Primary Care Provider:  Marda Stalker, PA-C Primary Cardiologist:  Lauree Chandler, MD  Chief Complaint / Patient Profile   71 y.o. y/o female with a h/o CAD s/p NSTEMI 09/2019 with severe mid LAD stenosis involving a diagonal, treated with DES to LAD and balloon angioplasty to diagonal, ischemic cardiomyopathy, sleep apnea treated with CPAP who is pending colonscopy and presents today for telephonic preoperative cardiovascular risk assessment.  Past Medical History    Past Medical History:  Diagnosis Date   Allergy    Asthma    Chronic combined systolic and diastolic CHF 20/94/7096   Ischemic CM // Echocardiogram 7/21: EF 40-45, Gr 2 DD, mid to apical ant/ant-sept AK, RVSP 23.4, trivial MR   Coronary artery disease    NSTEMI 7/21: oLAD 30, pLAD 99 >> PCI: DES; D1 90 >> PCI: POBA, prox and mid RCA 15, EF 35   Diverticulitis    Gallbladder problem    GERD (gastroesophageal reflux disease)    History of heart attack    Hyperlipidemia 10/15/2019   Zetia DC'd during admit for NSTEMI 7/21 >> ? to Atorvastatin   IBS (irritable colon syndrome)    Ischemic  cardiomyopathy 10/15/2019   Joint pain    Lactose intolerance    Multiple food allergies    Non-ST elevation (NSTEMI) MI 10/13/2019   PCI:  DES to LAD and POBA to D1   Past Surgical History:  Procedure Laterality Date   BREAST BIOPSY Left    No Scar seen    BREAST CYST EXCISION Left    No scar seen    CARDIAC CATHETERIZATION     COLONOSCOPY     CORONARY STENT INTERVENTION  10/13/2019   CORONARY STENT INTERVENTION N/A 10/13/2019   Procedure: CORONARY STENT INTERVENTION;  Surgeon: Nelva Bush, MD;  Location: [place];  Service: Cardiovascular;  Laterality: N/A;  mid Bee N/A 05/19/2021   Procedure: FLEXIBLE SIGMOIDOSCOPY;  Surgeon: Irene Shipper, MD;  Location: [facility];  Service: Endoscopy;  Laterality: N/A;   LAPAROTOMY N/A 05/20/2021   Procedure: EXPLORATORY LAPAROTOMY WITH RIGHT HEMI  COLECTOMY; CREATION OF LOOP SIGMOID COLOSTOMY;  Surgeon: Clovis Riley, MD;  Location: [facility];  Service: General;  Laterality: N/A;   LEFT HEART CATH AND CORONARY ANGIOGRAPHY N/A 10/13/2019   Procedure: LEFT HEART CATH AND CORONARY ANGIOGRAPHY;  Surgeon: Nelva Bush, MD;  Location: [place];  Service: Cardiovascular;  Laterality: N/A;   TONSILLECTOMY      Allergies  Allergies  Allergen Reactions   Sulfa Antibiotics Other (See Comments)    As a child, turned red above neck    History of Present Illness    Valerie Goris  Fields is a 71 y.o. female who presents via Engineer, civil (consulting) for a telehealth visit today.  Pt was last seen in cardiology clinic on 04/24/21 by Dr. Angelena Form.  At that time Klaryssa Fauth was doing well.  The patient is now pending procedure as outlined above. Since her last visit, she  denies chest pain, shortness of breath, lower extremity edema, fatigue, palpitations, melena, hematuria, hemoptysis, diaphoresis, weakness, presyncope, syncope, orthopnea, and PND.   Home Medications    Prior to Admission  medications   Medication Sig Start Date End Date Taking? Authorizing Provider  acetaminophen (TYLENOL) 500 MG tablet Take 2 tablets (1,000 mg total) by mouth every 6 (six) hours as needed. Patient not taking: Reported on 06/07/2021 05/29/21   Saverio Danker, PA-C  acetaminophen (TYLENOL) 650 MG CR tablet Take 650 mg by mouth every 6 (six) hours as needed for pain (only takes with oxycodone).    [provider]  aspirin EC 81 MG tablet Take 81 mg by mouth daily. Swallow whole.    [provider]  atorvastatin (LIPITOR) 80 MG tablet TAKE ONE TABLET BY MOUTH EVERYDAY AT BEDTIME Patient taking differently: Take 80 mg by mouth at bedtime. 04/19/21   Burnell Blanks, MD  azelastine (ASTELIN) 0.1 % nasal spray Place 1 spray into both nostrils daily. Use in each nostril as directed    [provider]  clopidogrel (PLAVIX) 75 MG tablet TAKE ONE TABLET BY MOUTH ONCE DAILY 07/15/21   Burnell Blanks, MD  lidocaine (LIDODERM) 5 % Place 1 patch onto the skin every 12 (twelve) hours. Remove & Discard patch within 12 hours or as directed by MD Patient not taking: Reported on 10/10/2021 05/30/21 05/30/22  Charlynne Cousins, MD  losartan (COZAAR) 25 MG tablet Take 1 tablet (25 mg total) by mouth every morning. 06/13/21   Charlynne Cousins, MD  methocarbamol (ROBAXIN) 500 MG tablet Take 1 tablet (500 mg total) by mouth every 6 (six) hours as needed for muscle spasms. Patient not taking: Reported on 10/10/2021 05/29/21   Saverio Danker, PA-C  metoprolol succinate (TOPROL-XL) 25 MG 24 hr tablet TAKE ONE TABLET BY MOUTH EVERY MORNING Patient taking differently: Take 25 mg by mouth daily. 04/19/21   Burnell Blanks, MD  montelukast (SINGULAIR) 10 MG tablet Take 10 mg by mouth at bedtime.  Patient not taking: Reported on 10/10/2021 08/20/15   [provider]  nitroGLYCERIN (NITROSTAT) 0.4 MG SL tablet Place 1 tablet (0.4 mg total) under the tongue every 5 (five) minutes  as needed for chest pain. 10/29/20 10/29/21  Burnell Blanks, MD  oxyCODONE (OXY IR/ROXICODONE) 5 MG immediate release tablet Take 1-2 tablets (5-10 mg total) by mouth every 6 (six) hours as needed for moderate pain. Patient not taking: Reported on 10/10/2021 05/29/21   Saverio Danker, PA-C  pantoprazole (PROTONIX) 40 MG tablet Take 1 tablet (40 mg total) by mouth 2 (two) times daily before a meal. Patient taking differently: Take 40 mg by mouth daily as needed (acid reflux). 02/16/20   Levin Erp, PA    Physical Exam    Vital Signs:  Anola Mcgough does not have vital signs available for review today.  Given telephonic nature of communication, physical exam is limited. AAOx3. NAD. Normal affect.  Speech and respirations are unlabored.  Accessory Clinical Findings    None  Assessment & Plan    1.  Preoperative Cardiovascular Risk Assessment: The patient is doing well from a cardiac perspective. Therefore,  based on ACC/AHA guidelines, the patient would be at acceptable risk for the planned procedure without further cardiovascular testing. The patient was advised that if he develops new symptoms prior to surgery to contact our office to arrange for a follow-up visit, and he verbalized understanding. Per office protocol, patient may hold Plavix for 5 days prior to procedure.  Ideally, patient would continue aspirin throughout the perioperative period.  However, if aspirin needs to be held, patient may hold aspirin for 7 days prior to procedure.  Please resume aspirin and Plavix as soon as possible postprocedure, the discretion of the surgeon. According to the Revised Cardiac Risk Index (RCRI), her Perioperative Risk of Major Cardiac Event is (%): 6.6. Her Functional Capacity in METs is: 5.62 according to the Duke Activity Status Index (DASI).   A copy of this note will be routed to requesting surgeon.  Time:   Today, I have spent 10 minutes with the patient with telehealth  technology discussing medical history, symptoms, and management plan.     Emmaline Life, NP-C    10/17/2021, 2:34 PM [place] [address] Office [phone] Fax [phone]

## 2021-10-22 ENCOUNTER — Encounter (HOSPITAL_COMMUNITY): Payer: Self-pay | Admitting: Nurse Practitioner

## 2021-11-01 ENCOUNTER — Other Ambulatory Visit: Payer: Self-pay | Admitting: Cardiovascular Disease

## 2021-11-06 ENCOUNTER — Telehealth: Payer: Self-pay | Admitting: *Deleted

## 2021-11-06 ENCOUNTER — Encounter (INDEPENDENT_AMBULATORY_CARE_PROVIDER_SITE_OTHER): Payer: Self-pay

## 2021-11-06 NOTE — Telephone Encounter (Signed)
   Pre-operative Risk Assessment    Patient Name: Valerie Fields  DOB: 28-Mar-1951 MRN: [id number]      Request for Surgical Clearance    Procedure:   LAPAROSCOPIC ILEOSTOMY TAKEDOWN   Date of Surgery:  Clearance TBD                                 Surgeon:  DR. Harrell Gave WHITE Surgeon's Group or Practice Name:  [facility] Phone number:  [phone] Fax number:  [phone] [place], CMA   Type of Clearance Requested:   - Medical  - Pharmacy:  Hold Clopidogrel (Plavix)     Type of Anesthesia:  General    Additional requests/questions:    Jiles Prows   11/06/2021, 1:48 PM

## 2021-11-06 NOTE — Telephone Encounter (Signed)
   Primary Cardiologist: Lauree Chandler, MD  Chart reviewed as part of pre-operative protocol coverage. Given past medical history and time since last visit, based on ACC/AHA guidelines, Valerie Fields would be at acceptable risk for the planned procedure without further cardiovascular testing.   She may hold her Plavix for 5 days prior to procedure. Ideally aspirin should be continued without interruption, however if the bleeding risk is too great, aspirin may be held for 7 days prior to surgery. Please resume aspirin post operatively when it is felt to be safe from a bleeding standpoint.   I will route this recommendation to the requesting party via Epic fax function and remove from pre-op pool.  Please call with questions.  Emmaline Life, NP-C    11/06/2021, 4:53 PM [place] [address], [address] Office [phone] Fax [phone]

## 2021-11-12 ENCOUNTER — Other Ambulatory Visit: Payer: Self-pay | Admitting: Surgery

## 2021-11-12 DIAGNOSIS — Z933 Colostomy status: Secondary | ICD-10-CM

## 2021-11-15 ENCOUNTER — Ambulatory Visit
Admission: RE | Admit: 2021-11-15 | Discharge: 2021-11-15 | Disposition: A | Discharge status: Home / Self Care | Payer: Medicare HMO | Source: Ambulatory Visit | Attending: Surgery | Admitting: Surgery

## 2021-11-15 DIAGNOSIS — Z933 Colostomy status: Secondary | ICD-10-CM

## 2021-12-10 ENCOUNTER — Other Ambulatory Visit: Payer: Self-pay | Admitting: Urology

## 2021-12-27 ENCOUNTER — Encounter: Payer: Self-pay | Admitting: Cardiovascular Disease

## 2022-01-23 NOTE — Progress Notes (Signed)
Surgery orders requested via Epic inbox. °

## 2022-01-23 NOTE — Progress Notes (Addendum)
COVID Vaccine Completed:  Yes x3  Date of COVID positive in last 90 days:  No  PCP - Marda Stalker, PA-C Cardiologist - Lauree Chandler, MD  Cardiac clearance in Epic dated 11-06-21 by Christen Bame, NP-C  Chest x-ray - 06-07-21 Epic EKG - 06-10-21 Epic Stress Test - N/A ECHO - 06-07-21 Epic Cardiac Cath - 2021 Epic Pacemaker/ICD device last checked: Spinal Cord Stimulator:  N/A  Bowel Prep - clear liquids day prior to surgery.  Patient has instructions for bowel prep  Sleep Study - Yes, +sleep apnea CPAP - Yes  Fasting Blood Sugar - N/A Checks Blood Sugar _____ times a day  Blood Thinner Instructions:  Plavix.  Hold x5 days.  Last dose 01-23-22 Aspirin Instructions:  ASA 81.  Hold 7 days  Last Dose:  01-19-22  Activity level:  Can go up a flight of stairs and perform activities of daily living without stopping and without symptoms of chest pain or shortness of breath.  Able to exercise without symptoms, works out 4 to 5 times a week  Anesthesia review: Hx of MI, CHF, ischemic cardiomyopathy, CAD, HTN, OSA  Patient report a cough at PAT visit, returned from [place] over a week ago.  She was advised to take a home Covid test due to cough and patient stated understanding  Patient denies shortness of breath, fever and chest pain at PAT appointment  Patient verbalized understanding of instructions that were given to them at the PAT appointment. Patient was also instructed that they will need to review over the PAT instructions again at home before surgery.

## 2022-01-23 NOTE — Patient Instructions (Addendum)
SURGICAL WAITING ROOM VISITATION Patients having surgery or a procedure may have no more than 2 support people in the waiting area - these visitors may rotate.   Children under the age of 74 must have an adult with them who is not the patient. If the patient needs to stay at the hospital during part of their recovery, the visitor guidelines for inpatient rooms apply. Pre-op nurse will coordinate an appropriate time for 1 support person to accompany patient in pre-op.  This support person may not rotate.    Please refer to the [facility] website for the visitor guidelines for Inpatients (after your surgery is over and you are in a regular room).      Your procedure is scheduled on: 01-29-22   Report to [facility] Main Entrance    Report to admitting at 6:15 AM   Call this number if you have problems the morning of surgery [phone]   Follow a clear liquid diet the day before surgery   After Midnight you may have the following liquids until 5:30 AM DAY OF SURGERY  Water Non-Citrus Juices (without pulp, NO RED) Carbonated Beverages Black Coffee (NO MILK/CREAM OR CREAMERS, sugar ok)  Clear Tea (NO MILK/CREAM OR CREAMERS, sugar ok) regular and decaf                             Plain Jell-O (NO RED)                                           Fruit ices (not with fruit pulp, NO RED)                                     Popsicles (NO RED)                                                               Sports drinks like Gatorade (NO RED)                       If you have questions, please contact your surgeon's office.   FOLLOW BOWEL PREP AND ANY ADDITIONAL PRE OP INSTRUCTIONS YOU RECEIVED FROM YOUR SURGEON'S OFFICE!!!     Oral Hygiene is also important to reduce your risk of infection.                                    Remember - BRUSH YOUR TEETH THE MORNING OF SURGERY WITH YOUR REGULAR TOOTHPASTE   Do NOT smoke after Midnight   Take these medicines the morning of surgery  with A SIP OF WATER:   Zyrtec  Metoprolol  Tylenol if needed  Bring CPAP mask and tubing day of surgery.                              You may not have any metal on your body including hair pins, jewelry, and  body piercing             Do not wear make-up, lotions, powders, perfumes, or deodorant  Do not wear nail polish including gel and S&S, artificial/acrylic nails, or any other type of covering on natural nails including finger and toenails. If you have artificial nails, gel coating, etc. that needs to be removed by a nail salon please have this removed prior to surgery or surgery may need to be canceled/ delayed if the surgeon/ anesthesia feels like they are unable to be safely monitored.   Do not shave  48 hours prior to surgery.    Do not bring valuables to the hospital. [place].   Contacts, dentures or bridgework may not be worn into surgery.   Bring small overnight bag day of surgery.   DO NOT [place]. PHARMACY WILL DISPENSE MEDICATIONS LISTED ON YOUR MEDICATION LIST TO YOU DURING YOUR ADMISSION [place]!    Special Instructions: Bring a copy of your healthcare power of attorney and living will documents the day of surgery if you haven't scanned them before.              Please read over the following fact sheets you were given: IF [place]  If you received a COVID test during your pre-op visit  it is requested that you wear a mask when out in public, stay away from anyone that may not be feeling well and notify your surgeon if you develop symptoms. If you test positive for Covid or have been in contact with anyone that has tested positive in the last 10 days please notify you surgeon.  [facility] - Preparing for Surgery Before surgery, you can play an important role.  Because skin is not sterile, your skin needs to be as free  of germs as possible.  You can reduce the number of germs on your skin by washing with CHG (chlorahexidine gluconate) soap before surgery.  CHG is an antiseptic cleaner which kills germs and bonds with the skin to continue killing germs even after washing. Please DO NOT use if you have an allergy to CHG or antibacterial soaps.  If your skin becomes reddened/irritated stop using the CHG and inform your nurse when you arrive at Short Stay. Do not shave (including legs and underarms) for at least 48 hours prior to the first CHG shower.  You may shave your face/neck.  Please follow these instructions carefully:  1.  Shower with CHG Soap the night before surgery and the  morning of surgery.  2.  If you choose to wash your hair, wash your hair first as usual with your normal  shampoo.  3.  After you shampoo, rinse your hair and body thoroughly to remove the shampoo.                             4.  Use CHG as you would any other liquid soap.  You can apply chg directly to the skin and wash.  Gently with a scrungie or clean washcloth.  5.  Apply the CHG Soap to your body ONLY FROM THE NECK DOWN.   Do   not use on face/ open  Wound or open sores. Avoid contact with eyes, ears mouth and   genitals (private parts).                       Wash face,  Genitals (private parts) with your normal soap.             6.  Wash thoroughly, paying special attention to the area where your    surgery  will be performed.  7.  Thoroughly rinse your body with warm water from the neck down.  8.  DO NOT shower/wash with your normal soap after using and rinsing off the CHG Soap.                9.  Pat yourself dry with a clean towel.            10.  Wear clean pajamas.            11.  Place clean sheets on your bed the night of your first shower and do not  sleep with pets. Day of Surgery : Do not apply any lotions/deodorants the morning of surgery.  Please wear clean clothes to the hospital/surgery  center.  FAILURE TO FOLLOW THESE INSTRUCTIONS MAY RESULT IN THE CANCELLATION OF YOUR SURGERY  PATIENT SIGNATURE_________________________________  NURSE SIGNATURE__________________________________  ________________________________________________________________________    WHAT IS A BLOOD TRANSFUSION? Blood Transfusion Information  A transfusion is the replacement of blood or some of its parts. Blood is made up of multiple cells which provide different functions. Red blood cells carry oxygen and are used for blood loss replacement. White blood cells fight against infection. Platelets control bleeding. Plasma helps clot blood. Other blood products are available for specialized needs, such as hemophilia or other clotting disorders. BEFORE THE TRANSFUSION  Who gives blood for transfusions?  Healthy volunteers who are fully evaluated to make sure their blood is safe. This is blood bank blood. Transfusion therapy is the safest it has ever been in the practice of medicine. Before blood is taken from a donor, a complete history is taken to make sure that person has no history of diseases nor engages in risky social behavior (examples are intravenous drug use or sexual activity with multiple partners). The donor's travel history is screened to minimize risk of transmitting infections, such as malaria. The donated blood is tested for signs of infectious diseases, such as HIV and hepatitis. The blood is then tested to be sure it is compatible with you in order to minimize the chance of a transfusion reaction. If you or a relative donates blood, this is often done in anticipation of surgery and is not appropriate for emergency situations. It takes many days to process the donated blood. RISKS AND COMPLICATIONS Although transfusion therapy is very safe and saves many lives, the main dangers of transfusion include:  Getting an infectious disease. Developing a transfusion reaction. This is an allergic  reaction to something in the blood you were given. Every precaution is taken to prevent this. The decision to have a blood transfusion has been considered carefully by your caregiver before blood is given. Blood is not given unless the benefits outweigh the risks. AFTER THE TRANSFUSION Right after receiving a blood transfusion, you will usually feel much better and more energetic. This is especially true if your red blood cells have gotten low (anemic). The transfusion raises the level of the red blood cells which carry oxygen, and this usually causes an energy increase. The nurse administering  the transfusion will monitor you carefully for complications. HOME CARE INSTRUCTIONS  No special instructions are needed after a transfusion. You may find your energy is better. Speak with your caregiver about any limitations on activity for underlying diseases you may have. SEEK MEDICAL CARE IF:  Your condition is not improving after your transfusion. You develop redness or irritation at the intravenous (IV) site. SEEK IMMEDIATE MEDICAL CARE IF:  Any of the following symptoms occur over the next 12 hours: Shaking chills. You have a temperature by mouth above 102 F (38.9 C), not controlled by medicine. Chest, back, or muscle pain. People around you feel you are not acting correctly or are confused. Shortness of breath or difficulty breathing. Dizziness and fainting. You get a rash or develop hives. You have a decrease in urine output. Your urine turns a dark color or changes to pink, red, or brown. Any of the following symptoms occur over the next 10 days: You have a temperature by mouth above 102 F (38.9 C), not controlled by medicine. Shortness of breath. Weakness after normal activity. The white part of the eye turns yellow (jaundice). You have a decrease in the amount of urine or are urinating less often. Your urine turns a dark color or changes to pink, red, or brown. Document Released:  03/14/2000 Document Revised: 06/09/2011 Document Reviewed: 11/01/2007 [facility] Patient Information 2014 [place], [place].  _______________________________________________________________________

## 2022-01-24 ENCOUNTER — Encounter (HOSPITAL_COMMUNITY): Payer: Self-pay

## 2022-01-24 ENCOUNTER — Other Ambulatory Visit: Payer: Self-pay

## 2022-01-24 ENCOUNTER — Encounter (HOSPITAL_COMMUNITY)
Admission: RE | Admit: 2022-01-24 | Discharge: 2022-01-24 | Disposition: A | Discharge status: Home / Self Care | Payer: Medicare HMO | Source: Ambulatory Visit | Attending: Surgery | Admitting: Surgery

## 2022-01-24 VITALS — BP 130/76 | HR 64 | Temp 98.2°F | Resp 16 | Ht 62.0 in | Wt 208.8 lb

## 2022-01-24 DIAGNOSIS — G4733 Obstructive sleep apnea (adult) (pediatric): Secondary | ICD-10-CM | POA: Diagnosis not present

## 2022-01-24 DIAGNOSIS — Z01818 Encounter for other preprocedural examination: Secondary | ICD-10-CM

## 2022-01-24 DIAGNOSIS — Z933 Colostomy status: Secondary | ICD-10-CM | POA: Diagnosis not present

## 2022-01-24 DIAGNOSIS — I509 Heart failure, unspecified: Secondary | ICD-10-CM | POA: Insufficient documentation

## 2022-01-24 DIAGNOSIS — Z01812 Encounter for preprocedural laboratory examination: Secondary | ICD-10-CM | POA: Insufficient documentation

## 2022-01-24 DIAGNOSIS — I2510 Atherosclerotic heart disease of native coronary artery without angina pectoris: Secondary | ICD-10-CM | POA: Diagnosis not present

## 2022-01-24 DIAGNOSIS — I255 Ischemic cardiomyopathy: Secondary | ICD-10-CM | POA: Diagnosis not present

## 2022-01-24 DIAGNOSIS — I110 Hypertensive heart disease with heart failure: Secondary | ICD-10-CM | POA: Diagnosis not present

## 2022-01-24 HISTORY — DX: Unspecified osteoarthritis, unspecified site: M19.90

## 2022-01-24 LAB — BASIC METABOLIC PANEL
Anion gap: 6 (ref 5–15)
BUN: 14 mg/dL (ref 8–23)
CO2: 25 mmol/L (ref 22–32)
Calcium: 9.9 mg/dL (ref 8.9–10.3)
Chloride: 108 mmol/L (ref 98–111)
Creatinine, Ser: 0.84 mg/dL (ref 0.44–1.00)
GFR, Estimated: >60 mL/min (ref >60)
Glucose, Bld: 90 mg/dL (ref 70–99)
Potassium: 3.9 mmol/L (ref 3.5–5.1)
Sodium: 139 mmol/L (ref 135–145)

## 2022-01-24 LAB — CBC
HCT: 42.5 % (ref 36.0–46.0)
Hemoglobin: 13.9 g/dL (ref 12.0–15.0)
MCH: 31.7 pg (ref 26.0–34.0)
MCHC: 32.7 g/dL (ref 30.0–36.0)
MCV: 96.8 fL (ref 80.0–100.0)
Platelets: 294 10*3/uL (ref 150–400)
RBC: 4.39 MIL/uL (ref 3.87–5.11)
RDW: 13.7 % (ref 11.5–15.5)
WBC: 8.7 10*3/uL (ref 4.0–10.5)
nRBC: 0 % (ref 0.0–0.2)

## 2022-01-27 ENCOUNTER — Ambulatory Visit: Payer: Self-pay | Admitting: Surgery

## 2022-01-27 DIAGNOSIS — Z01818 Encounter for other preprocedural examination: Secondary | ICD-10-CM

## 2022-01-27 NOTE — Anesthesia Preprocedure Evaluation (Signed)
Anesthesia Evaluation Anesthesia Physical Anesthesia Plan  ASA:   Anesthesia Plan:    Post-op Pain Management:    Induction:   PONV Risk Score and Plan:   Airway Management Planned:   Additional Equipment:   Intra-op Plan:   Post-operative Plan:   Informed Consent:   Plan Discussed with:   Anesthesia Plan Comments: (See APP note by Durel Salts, FNP )        Anesthesia Quick Evaluation

## 2022-01-27 NOTE — Progress Notes (Signed)
Anesthesia Chart Review:   Case: [id number] Date/Time: 01/29/22 0815   Procedures:      XI ROBOTIC ASSISTED LOWER ANTERIOR RESECTION WITH TAKEDOWN OF LOOP COLOSTOMY     FLEXIBLE SIGMOIDOSCOPY WITH POSSIBLE COLOSOMY REVISION     CYSTOSCOPY with FIREFLY INJECTION   Anesthesia type: General   Pre-op diagnosis: COLOSTOMY STATUS   Location: [facility] ROOM 02 / [facility]   Surgeons: Ileana Roup, MD; Ceasar Mons, MD       DISCUSSION: Pt is 71 years old with hx CAD (stent to LAD and balloon angioplasty to D1 2021), ischemic cardiomyopathy (EF normal on 2023 echo), CHF, HTN, OSA  Pt to hold plavix 5 days and ASA 7 days before surgery    VS: BP 130/76   Pulse 64   Temp 36.8 C (Oral)   Resp 16   Ht '5\' 2"'$  (1.575 m)   Wt 94.7 kg   SpO2 99%   BMI 38.19 kg/m   PROVIDERS: - PCP is Marda Stalker, PA-C - Cardiologist is Lauree Chandler, MD. Last office visit 04/24/21. Cleared for surgery at acceptable risk by Christen Bame, NP   LABS: Labs reviewed: Acceptable for surgery. (all labs ordered are listed, but only abnormal results are displayed)  BMP 01/24/22: K 3.2, glucose 115, Ca 8.8 CBC 01/24/22: WBC 10.7. H/H 10.3/32.0. Platelets 691    EKG 06/07/21:  Sinus rhythm Left atrial enlargement Lateral infarct, age indeterminate Anteroseptal infarct, old Prolonged QT interval rbb like pattern with downsloping st segments in inferior leads    CV: Echo 06/07/21:  1. Left ventricular ejection fraction, by estimation, is 55%. The left ventricle has low normal function. Left ventricular endocardial border not optimally defined to evaluate regional wall motion. Left ventricular diastolic parameters are consistent with Grade II diastolic dysfunction (pseudonormalization).  2. Right ventricular systolic function is normal. The right ventricular size is normal. There is moderately elevated pulmonary artery systolic pressure.  3. Moderate pleural effusion in the left  lateral region.  4. The mitral valve is grossly normal. Trivial mitral valve regurgitation. Moderate mitral annular calcification.  5. The aortic valve is tricuspid. Aortic valve regurgitation is not visualized. No aortic stenosis is present.  6. The inferior vena cava is dilated in size with >50% respiratory variability, suggesting right atrial pressure of 8 mmHg.   Carotid duplex 10/28/19:  - Right Carotid: Velocities in the right ICA are consistent with a 1-39% stenosis.  - Left Carotid: Velocities in the left ICA are consistent with a 1-39% stenosis.  - Vertebrals:  Bilateral vertebral arteries demonstrate antegrade flow.  - Subclavians: Normal flow hemodynamics were seen in bilateral subclavian arteries.   Cardiac cath 10/13/19:  Severe single-vessel CAD with 99% stenosis of mid LAD involving D1 with TIMI-1 flow. Moderately to severely reduced left ventricular systolic function with mid and apical anterior hypokinesis/akinesis.  LVEF ~35%. Mildly elevated left ventricular filling pressure (LVEDP 20-25 mmHg). Successful PCI to mid LAD/D1 using Resolute Onyx 2.75 x 22 mm drug-eluting stent and kissing balloon inflation in LAD/D1 with 0% residual stenosis and TIMI-3 flow.   Past Medical History:  Diagnosis Date   Allergy    Arthritis    Asthma    Chronic combined systolic and diastolic CHF 09/47/0962   Ischemic CM // Echocardiogram 7/21: EF 40-45, Gr 2 DD, mid to apical ant/ant-sept AK, RVSP 23.4, trivial MR   Coronary artery disease    NSTEMI 7/21: oLAD 30, pLAD 99 >> PCI: DES; D1 90 >> PCI: POBA, prox  and mid RCA 15, EF 35   Diverticulitis    Gallbladder problem    GERD (gastroesophageal reflux disease)    History of heart attack    Hyperlipidemia 10/15/2019   Zetia DC'd during admit for NSTEMI 7/21 >> ? to Atorvastatin   IBS (irritable colon syndrome)    Ischemic cardiomyopathy 10/15/2019   Joint pain    Lactose intolerance    Multiple food allergies    Non-ST elevation  (NSTEMI) MI 10/13/2019   PCI:  DES to LAD and POBA to D1    Past Surgical History:  Procedure Laterality Date   BREAST BIOPSY Left    No Scar seen    BREAST CYST EXCISION Left    No scar seen    CARDIAC CATHETERIZATION     COLONOSCOPY     CORONARY STENT INTERVENTION  10/13/2019   CORONARY STENT INTERVENTION N/A 10/13/2019   Procedure: CORONARY STENT INTERVENTION;  Surgeon: Nelva Bush, MD;  Location: [facility];  Service: Cardiovascular;  Laterality: N/A;  mid [place] N/A 05/19/2021   Procedure: FLEXIBLE SIGMOIDOSCOPY;  Surgeon: Irene Shipper, MD;  [facility];  Service: Endoscopy;  Laterality: N/A;   LAPAROTOMY N/A 05/20/2021   Procedure: EXPLORATORY LAPAROTOMY WITH RIGHT HEMI  COLECTOMY; CREATION OF LOOP SIGMOID COLOSTOMY;  Surgeon: Clovis Riley, MD;  [facility];  Service: General;  Laterality: N/A;   LEFT HEART CATH AND CORONARY ANGIOGRAPHY N/A 10/13/2019   Procedure: LEFT HEART CATH AND CORONARY ANGIOGRAPHY;  Surgeon: Nelva Bush, MD;  Location: [facility];  Service: Cardiovascular;  Laterality: N/A;   TONSILLECTOMY      MEDICATIONS:  acetaminophen (TYLENOL) 650 MG CR tablet   ascorbic acid (VITAMIN C) 500 MG tablet   aspirin EC 81 MG tablet   atorvastatin (LIPITOR) 80 MG tablet   azelastine (ASTELIN) 0.1 % nasal spray   b complex vitamins capsule   Biotin w/ Vitamins C & E (HAIR SKIN & NAILS GUMMIES PO)   cetirizine (ZYRTEC) 10 MG tablet   clopidogrel (PLAVIX) 75 MG tablet   losartan (COZAAR) 25 MG tablet   metoprolol succinate (TOPROL-XL) 25 MG 24 hr tablet   montelukast (SINGULAIR) 10 MG tablet   Multiple Vitamin (MULTIVITAMIN WITH MINERALS) TABS tablet   nitroGLYCERIN (NITROSTAT) 0.4 MG SL tablet   silver sulfADIAZINE (SILVADENE) 1 % cream   zinc gluconate 50 MG tablet   No current facility-administered medications for this encounter.    If no changes, I anticipate pt can proceed with surgery as  scheduled.   Willeen Cass, PhD, FNP-BC [facility]/Anesthesiology Phone: [phone] 01/27/2022 10:11 AM

## 2022-01-29 ENCOUNTER — Other Ambulatory Visit: Payer: Self-pay

## 2022-01-29 ENCOUNTER — Inpatient Hospital Stay (HOSPITAL_COMMUNITY)
Admission: RE | Admit: 2022-01-29 | Discharge: 2022-02-02 | DRG: 331 | Disposition: A | Discharge status: Home / Self Care | Payer: Medicare HMO | Attending: Surgery | Admitting: Surgery

## 2022-01-29 ENCOUNTER — Encounter (HOSPITAL_COMMUNITY)
Admission: RE | Disposition: A | Discharge status: Home / Self Care | Payer: Self-pay | Source: Home / Self Care | Attending: Surgery

## 2022-01-29 ENCOUNTER — Inpatient Hospital Stay (HOSPITAL_COMMUNITY): Payer: Medicare HMO | Admitting: Certified Registered"

## 2022-01-29 ENCOUNTER — Inpatient Hospital Stay (HOSPITAL_COMMUNITY): Payer: Medicare HMO | Admitting: Emergency Medicine

## 2022-01-29 ENCOUNTER — Encounter (HOSPITAL_COMMUNITY): Payer: Self-pay | Admitting: Surgery

## 2022-01-29 DIAGNOSIS — Z9989 Dependence on other enabling machines and devices: Secondary | ICD-10-CM | POA: Diagnosis not present

## 2022-01-29 DIAGNOSIS — E785 Hyperlipidemia, unspecified: Secondary | ICD-10-CM | POA: Diagnosis present

## 2022-01-29 DIAGNOSIS — G4733 Obstructive sleep apnea (adult) (pediatric): Secondary | ICD-10-CM | POA: Diagnosis present

## 2022-01-29 DIAGNOSIS — Z800 Family history of malignant neoplasm of digestive organs: Secondary | ICD-10-CM | POA: Diagnosis not present

## 2022-01-29 DIAGNOSIS — K219 Gastro-esophageal reflux disease without esophagitis: Secondary | ICD-10-CM | POA: Diagnosis present

## 2022-01-29 DIAGNOSIS — K435 Parastomal hernia without obstruction or  gangrene: Secondary | ICD-10-CM | POA: Diagnosis present

## 2022-01-29 DIAGNOSIS — Z91018 Allergy to other foods: Secondary | ICD-10-CM | POA: Diagnosis not present

## 2022-01-29 DIAGNOSIS — Z433 Encounter for attention to colostomy: Secondary | ICD-10-CM | POA: Diagnosis not present

## 2022-01-29 DIAGNOSIS — I2510 Atherosclerotic heart disease of native coronary artery without angina pectoris: Secondary | ICD-10-CM

## 2022-01-29 DIAGNOSIS — Z955 Presence of coronary angioplasty implant and graft: Secondary | ICD-10-CM

## 2022-01-29 DIAGNOSIS — I10 Essential (primary) hypertension: Secondary | ICD-10-CM | POA: Diagnosis present

## 2022-01-29 DIAGNOSIS — I252 Old myocardial infarction: Secondary | ICD-10-CM | POA: Diagnosis not present

## 2022-01-29 DIAGNOSIS — Z9889 Other specified postprocedural states: Secondary | ICD-10-CM | POA: Diagnosis present

## 2022-01-29 DIAGNOSIS — K660 Peritoneal adhesions (postprocedural) (postinfection): Secondary | ICD-10-CM | POA: Diagnosis present

## 2022-01-29 DIAGNOSIS — K5730 Diverticulosis of large intestine without perforation or abscess without bleeding: Secondary | ICD-10-CM | POA: Diagnosis present

## 2022-01-29 DIAGNOSIS — Z882 Allergy status to sulfonamides status: Secondary | ICD-10-CM

## 2022-01-29 DIAGNOSIS — E739 Lactose intolerance, unspecified: Secondary | ICD-10-CM | POA: Diagnosis present

## 2022-01-29 DIAGNOSIS — Z01818 Encounter for other preprocedural examination: Secondary | ICD-10-CM

## 2022-01-29 HISTORY — PX: FLEXIBLE SIGMOIDOSCOPY: SHX5431

## 2022-01-29 HISTORY — PX: XI ROBOTIC ASSISTED LOWER ANTERIOR RESECTION: SHX6558

## 2022-01-29 LAB — COMPREHENSIVE METABOLIC PANEL
ALT: 30 U/L (ref 0–44)
AST: 28 U/L (ref 15–41)
Albumin: 4 g/dL (ref 3.5–5.0)
Alkaline Phosphatase: 70 U/L (ref 38–126)
Anion gap: 9 (ref 5–15)
BUN: 13 mg/dL (ref 8–23)
CO2: 21 mmol/L — ABNORMAL LOW (ref 22–32)
Calcium: 9.7 mg/dL (ref 8.9–10.3)
Chloride: 106 mmol/L (ref 98–111)
Creatinine, Ser: 0.99 mg/dL (ref 0.44–1.00)
GFR, Estimated: >60 mL/min (ref >60)
Glucose, Bld: 124 mg/dL — ABNORMAL HIGH (ref 70–99)
Potassium: 3.4 mmol/L — ABNORMAL LOW (ref 3.5–5.1)
Sodium: 136 mmol/L (ref 135–145)
Total Bilirubin: 0.8 mg/dL (ref 0.3–1.2)
Total Protein: 7.8 g/dL (ref 6.5–8.1)

## 2022-01-29 LAB — CBC WITH DIFFERENTIAL/PLATELET
Abs Immature Granulocytes: 0.02 10*3/uL (ref 0.00–0.07)
Basophils Absolute: 0 10*3/uL (ref 0.0–0.1)
Basophils Relative: 0 %
Eosinophils Absolute: 0.1 10*3/uL (ref 0.0–0.5)
Eosinophils Relative: 1 %
HCT: 45 % (ref 36.0–46.0)
Hemoglobin: 15 g/dL (ref 12.0–15.0)
Immature Granulocytes: 0 %
Lymphocytes Relative: 26 %
Lymphs Abs: 2.3 10*3/uL (ref 0.7–4.0)
MCH: 31.7 pg (ref 26.0–34.0)
MCHC: 33.3 g/dL (ref 30.0–36.0)
MCV: 95.1 fL (ref 80.0–100.0)
Monocytes Absolute: 0.7 10*3/uL (ref 0.1–1.0)
Monocytes Relative: 8 %
Neutro Abs: 5.6 10*3/uL (ref 1.7–7.7)
Neutrophils Relative %: 65 %
Platelets: 325 10*3/uL (ref 150–400)
RBC: 4.73 MIL/uL (ref 3.87–5.11)
RDW: 13.3 % (ref 11.5–15.5)
WBC: 8.8 10*3/uL (ref 4.0–10.5)
nRBC: 0 % (ref 0.0–0.2)

## 2022-01-29 LAB — TYPE AND SCREEN
ABO/RH(D): O POS
Antibody Screen: NEGATIVE

## 2022-01-29 LAB — ABO/RH: ABO/RH(D): O POS

## 2022-01-29 SURGERY — RESECTION, RECTUM, LOW ANTERIOR, ROBOT-ASSISTED
Anesthesia: General | Site: Rectum

## 2022-01-29 MED ORDER — POLYETHYLENE GLYCOL 3350 17 GM/SCOOP PO POWD
1.0000 | Freq: Once | ORAL | Status: DC
  Filled 2022-01-29: qty 255

## 2022-01-29 MED ORDER — TRAMADOL HCL 50 MG PO TABS
50.0000 mg | ORAL_TABLET | Freq: Four times a day (QID) | ORAL | Status: DC | PRN
  Administered 2022-01-29: 50 mg via ORAL
  Filled 2022-01-29 (×2): qty 1

## 2022-01-29 MED ORDER — STERILE WATER FOR INJECTION IJ SOLN
INTRAMUSCULAR | Status: AC
  Filled 2022-01-29: qty 10

## 2022-01-29 MED ORDER — ONDANSETRON HCL 4 MG/2ML IJ SOLN
4.0000 mg | Freq: Four times a day (QID) | INTRAMUSCULAR | Status: DC | PRN
  Filled 2022-01-29: qty 2

## 2022-01-29 MED ORDER — PROPOFOL 10 MG/ML IV BOLUS
INTRAVENOUS | Status: AC
  Filled 2022-01-29: qty 20

## 2022-01-29 MED ORDER — EPHEDRINE SULFATE-NACL 50-0.9 MG/10ML-% IV SOSY
PREFILLED_SYRINGE | INTRAVENOUS | Status: DC | PRN
  Administered 2022-01-29 (×2): 5 mg via INTRAVENOUS
  Administered 2022-01-29: 10 mg via INTRAVENOUS
  Administered 2022-01-29 (×2): 5 mg via INTRAVENOUS
  Administered 2022-01-29: 10 mg via INTRAVENOUS

## 2022-01-29 MED ORDER — DIPHENHYDRAMINE HCL 50 MG/ML IJ SOLN
12.5000 mg | Freq: Four times a day (QID) | INTRAMUSCULAR | Status: DC | PRN
  Filled 2022-01-29 (×2): qty 1

## 2022-01-29 MED ORDER — LACTATED RINGERS IR SOLN
Status: DC | PRN
  Administered 2022-01-29: 1000 mL

## 2022-01-29 MED ORDER — ALVIMOPAN 12 MG PO CAPS
12.0000 mg | ORAL_CAPSULE | Freq: Two times a day (BID) | ORAL | Status: DC
  Administered 2022-01-30 – 2022-01-31 (×3): 12 mg via ORAL
  Filled 2022-01-29 (×4): qty 1

## 2022-01-29 MED ORDER — ONDANSETRON HCL 4 MG/2ML IJ SOLN
INTRAMUSCULAR | Status: DC | PRN
  Administered 2022-01-29: 4 mg via INTRAVENOUS

## 2022-01-29 MED ORDER — METOPROLOL SUCCINATE ER 25 MG PO TB24
25.0000 mg | ORAL_TABLET | Freq: Every morning | ORAL | Status: DC
  Administered 2022-01-30 – 2022-02-02 (×4): 25 mg via ORAL
  Filled 2022-01-29 (×4): qty 1

## 2022-01-29 MED ORDER — LIDOCAINE 2% (20 MG/ML) 5 ML SYRINGE
INTRAMUSCULAR | Status: DC | PRN
  Administered 2022-01-29: 60 mg via INTRAVENOUS

## 2022-01-29 MED ORDER — ROCURONIUM BROMIDE 10 MG/ML (PF) SYRINGE
PREFILLED_SYRINGE | INTRAVENOUS | Status: AC
  Filled 2022-01-29: qty 10

## 2022-01-29 MED ORDER — EPHEDRINE 5 MG/ML INJ
INTRAVENOUS | Status: AC
  Filled 2022-01-29: qty 5

## 2022-01-29 MED ORDER — HYDROMORPHONE HCL 1 MG/ML IJ SOLN
INTRAMUSCULAR | Status: DC | PRN
  Administered 2022-01-29 (×2): .25 mg via INTRAVENOUS

## 2022-01-29 MED ORDER — ACETAMINOPHEN 500 MG PO TABS: 1000.0000 mg | ORAL_TABLET | ORAL | Status: DC

## 2022-01-29 MED ORDER — IBUPROFEN 400 MG PO TABS: 600.0000 mg | ORAL_TABLET | Freq: Four times a day (QID) | ORAL | Status: DC | PRN

## 2022-01-29 MED ORDER — ALUM & MAG HYDROXIDE-SIMETH 200-200-20 MG/5ML PO SUSP
30.0000 mL | Freq: Four times a day (QID) | ORAL | Status: DC | PRN
  Administered 2022-01-30 – 2022-02-02 (×2): 30 mL via ORAL
  Filled 2022-01-29 (×3): qty 30

## 2022-01-29 MED ORDER — ONDANSETRON HCL 4 MG PO TABS: 4.0000 mg | ORAL_TABLET | Freq: Four times a day (QID) | ORAL | Status: DC | PRN

## 2022-01-29 MED ORDER — KETAMINE HCL 10 MG/ML IJ SOLN
INTRAMUSCULAR | Status: DC | PRN
  Administered 2022-01-29: 25 mg via INTRAVENOUS

## 2022-01-29 MED ORDER — BUPIVACAINE LIPOSOME 1.3 % IJ SUSP
INTRAMUSCULAR | Status: DC | PRN
  Administered 2022-01-29: 20 mL

## 2022-01-29 MED ORDER — SODIUM CHLORIDE 0.9% FLUSH: 9.0000 mL | INTRAVENOUS | Status: DC | PRN

## 2022-01-29 MED ORDER — DIPHENHYDRAMINE HCL 50 MG/ML IJ SOLN
12.5000 mg | Freq: Four times a day (QID) | INTRAMUSCULAR | Status: DC | PRN
  Administered 2022-01-30: 12.5 mg via INTRAVENOUS

## 2022-01-29 MED ORDER — SODIUM CHLORIDE 0.9 % IR SOLN
Status: DC | PRN
  Administered 2022-01-29: 1000 mL

## 2022-01-29 MED ORDER — KETAMINE HCL 10 MG/ML IJ SOLN
INTRAMUSCULAR | Status: AC
  Filled 2022-01-29: qty 1

## 2022-01-29 MED ORDER — SUGAMMADEX SODIUM 200 MG/2ML IV SOLN
INTRAVENOUS | Status: DC | PRN
  Administered 2022-01-29: 200 mg via INTRAVENOUS

## 2022-01-29 MED ORDER — ACETAMINOPHEN 500 MG PO TABS
1000.0000 mg | ORAL_TABLET | Freq: Four times a day (QID) | ORAL | Status: DC
  Administered 2022-01-29 – 2022-02-02 (×13): 1000 mg via ORAL
  Filled 2022-01-29 (×14): qty 2

## 2022-01-29 MED ORDER — SODIUM CHLORIDE (PF) 0.9 % IJ SOLN
INTRAMUSCULAR | Status: AC
  Filled 2022-01-29: qty 20

## 2022-01-29 MED ORDER — BUPIVACAINE-EPINEPHRINE (PF) 0.5% -1:200000 IJ SOLN
INTRAMUSCULAR | Status: AC
  Filled 2022-01-29: qty 30

## 2022-01-29 MED ORDER — DIPHENHYDRAMINE HCL 12.5 MG/5ML PO ELIX
12.5000 mg | ORAL_SOLUTION | Freq: Four times a day (QID) | ORAL | Status: DC | PRN
  Filled 2022-01-29: qty 5

## 2022-01-29 MED ORDER — ATORVASTATIN CALCIUM 20 MG PO TABS
80.0000 mg | ORAL_TABLET | Freq: Every day | ORAL | Status: DC
  Administered 2022-01-29 – 2022-02-01 (×4): 80 mg via ORAL
  Filled 2022-01-29 (×4): qty 4

## 2022-01-29 MED ORDER — MIDAZOLAM HCL 2 MG/2ML IJ SOLN
INTRAMUSCULAR | Status: AC
  Filled 2022-01-29: qty 2

## 2022-01-29 MED ORDER — FENTANYL CITRATE (PF) 100 MCG/2ML IJ SOLN
INTRAMUSCULAR | Status: AC
  Filled 2022-01-29: qty 2

## 2022-01-29 MED ORDER — LACTATED RINGERS IV SOLN: INTRAVENOUS | Status: DC

## 2022-01-29 MED ORDER — DEXAMETHASONE SODIUM PHOSPHATE 10 MG/ML IJ SOLN
INTRAMUSCULAR | Status: AC
  Filled 2022-01-29: qty 1

## 2022-01-29 MED ORDER — HYDROMORPHONE 1 MG/ML IV SOLN
INTRAVENOUS | Status: DC
  Administered 2022-01-29: 1 mg via INTRAVENOUS
  Administered 2022-01-29: 2.8 mg via INTRAVENOUS
  Administered 2022-01-29: 1 mg via INTRAVENOUS
  Administered 2022-01-30: 1.4 mg via INTRAVENOUS
  Administered 2022-01-30: 0.6 mg via INTRAVENOUS
  Administered 2022-01-30: 1 mg via INTRAVENOUS
  Administered 2022-01-30 (×2): 0.2 mg via INTRAVENOUS
  Administered 2022-01-30: 0.6 mg via INTRAVENOUS
  Administered 2022-01-31: 0.4 mg via INTRAVENOUS
  Filled 2022-01-29: qty 30

## 2022-01-29 MED ORDER — ONDANSETRON HCL 4 MG/2ML IJ SOLN
INTRAMUSCULAR | Status: AC
  Filled 2022-01-29: qty 2

## 2022-01-29 MED ORDER — ACETAMINOPHEN 500 MG PO TABS
1000.0000 mg | ORAL_TABLET | Freq: Once | ORAL | Status: AC
  Administered 2022-01-29: 1000 mg via ORAL
  Filled 2022-01-29: qty 2

## 2022-01-29 MED ORDER — LACTATED RINGERS IV SOLN: INTRAVENOUS | Status: DC | PRN

## 2022-01-29 MED ORDER — CHLORHEXIDINE GLUCONATE CLOTH 2 % EX PADS: 6.0000 | MEDICATED_PAD | Freq: Once | CUTANEOUS | Status: DC

## 2022-01-29 MED ORDER — HEPARIN SODIUM (PORCINE) 5000 UNIT/ML IJ SOLN
5000.0000 [IU] | Freq: Once | INTRAMUSCULAR | Status: AC
  Administered 2022-01-29: 5000 [IU] via SUBCUTANEOUS
  Filled 2022-01-29: qty 1

## 2022-01-29 MED ORDER — ONDANSETRON HCL 4 MG/2ML IJ SOLN: 4.0000 mg | Freq: Once | INTRAMUSCULAR | Status: DC | PRN

## 2022-01-29 MED ORDER — HYDROMORPHONE HCL 2 MG/ML IJ SOLN
INTRAMUSCULAR | Status: AC
  Filled 2022-01-29: qty 1

## 2022-01-29 MED ORDER — PROPOFOL 10 MG/ML IV BOLUS
INTRAVENOUS | Status: DC | PRN
  Administered 2022-01-29: 120 mg via INTRAVENOUS

## 2022-01-29 MED ORDER — SODIUM CHLORIDE (PF) 0.9 % IJ SOLN
INTRAMUSCULAR | Status: AC
  Filled 2022-01-29: qty 10

## 2022-01-29 MED ORDER — LOSARTAN POTASSIUM 25 MG PO TABS
25.0000 mg | ORAL_TABLET | Freq: Every morning | ORAL | Status: DC
  Administered 2022-01-30 – 2022-02-02 (×4): 25 mg via ORAL
  Filled 2022-01-29 (×4): qty 1

## 2022-01-29 MED ORDER — FENTANYL CITRATE PF 50 MCG/ML IJ SOSY
25.0000 ug | PREFILLED_SYRINGE | INTRAMUSCULAR | Status: DC | PRN
  Administered 2022-01-29: 50 ug via INTRAVENOUS

## 2022-01-29 MED ORDER — ENSURE PRE-SURGERY PO LIQD
296.0000 mL | Freq: Once | ORAL | Status: DC
  Filled 2022-01-29: qty 296

## 2022-01-29 MED ORDER — MONTELUKAST SODIUM 10 MG PO TABS
10.0000 mg | ORAL_TABLET | Freq: Every day | ORAL | Status: DC
  Administered 2022-01-29 – 2022-02-01 (×4): 10 mg via ORAL
  Filled 2022-01-29 (×4): qty 1

## 2022-01-29 MED ORDER — SODIUM CHLORIDE (PF) 0.9 % IJ SOLN
INTRAMUSCULAR | Status: DC | PRN
  Administered 2022-01-29: 20 mL

## 2022-01-29 MED ORDER — VITAMIN C 500 MG PO TABS
500.0000 mg | ORAL_TABLET | Freq: Every day | ORAL | Status: DC
  Administered 2022-01-30 – 2022-02-02 (×4): 500 mg via ORAL
  Filled 2022-01-29 (×4): qty 1

## 2022-01-29 MED ORDER — CHLORHEXIDINE GLUCONATE 0.12 % MT SOLN
15.0000 mL | Freq: Once | OROMUCOSAL | Status: AC
  Administered 2022-01-29: 15 mL via OROMUCOSAL

## 2022-01-29 MED ORDER — ROCURONIUM BROMIDE 10 MG/ML (PF) SYRINGE
PREFILLED_SYRINGE | INTRAVENOUS | Status: DC | PRN
  Administered 2022-01-29 (×2): 20 mg via INTRAVENOUS
  Administered 2022-01-29: 10 mg via INTRAVENOUS
  Administered 2022-01-29 (×2): 20 mg via INTRAVENOUS
  Administered 2022-01-29: 10 mg via INTRAVENOUS
  Administered 2022-01-29: 70 mg via INTRAVENOUS

## 2022-01-29 MED ORDER — ALVIMOPAN 12 MG PO CAPS
12.0000 mg | ORAL_CAPSULE | ORAL | Status: AC
  Administered 2022-01-29: 12 mg via ORAL
  Filled 2022-01-29: qty 1

## 2022-01-29 MED ORDER — ENSURE PRE-SURGERY PO LIQD
592.0000 mL | Freq: Once | ORAL | Status: DC
  Filled 2022-01-29: qty 592

## 2022-01-29 MED ORDER — BUPIVACAINE LIPOSOME 1.3 % IJ SUSP: 20.0000 mL | Freq: Once | INTRAMUSCULAR | Status: DC

## 2022-01-29 MED ORDER — ONDANSETRON HCL 4 MG/2ML IJ SOLN
4.0000 mg | Freq: Four times a day (QID) | INTRAMUSCULAR | Status: DC | PRN
  Administered 2022-01-29 – 2022-01-30 (×2): 4 mg via INTRAVENOUS
  Filled 2022-01-29 (×2): qty 2

## 2022-01-29 MED ORDER — HEPARIN SODIUM (PORCINE) 5000 UNIT/ML IJ SOLN
5000.0000 [IU] | Freq: Three times a day (TID) | INTRAMUSCULAR | Status: DC
  Administered 2022-01-29 – 2022-02-02 (×11): 5000 [IU] via SUBCUTANEOUS
  Filled 2022-01-29 (×11): qty 1

## 2022-01-29 MED ORDER — STERILE WATER FOR INJECTION IJ SOLN
INTRAMUSCULAR | Status: DC | PRN
  Administered 2022-01-29: 10 mL

## 2022-01-29 MED ORDER — NALOXONE HCL 0.4 MG/ML IJ SOLN: 0.4000 mg | INTRAMUSCULAR | Status: DC | PRN

## 2022-01-29 MED ORDER — SIMETHICONE 80 MG PO CHEW
40.0000 mg | CHEWABLE_TABLET | Freq: Four times a day (QID) | ORAL | Status: DC | PRN
  Administered 2022-02-01: 40 mg via ORAL
  Filled 2022-01-29: qty 1

## 2022-01-29 MED ORDER — OXYCODONE HCL 5 MG PO TABS: 5.0000 mg | ORAL_TABLET | Freq: Once | ORAL | Status: AC | PRN

## 2022-01-29 MED ORDER — ORAL CARE MOUTH RINSE: 15.0000 mL | Freq: Once | OROMUCOSAL | Status: AC

## 2022-01-29 MED ORDER — STERILE WATER FOR IRRIGATION IR SOLN
Status: DC | PRN
  Administered 2022-01-29: 1000 mL

## 2022-01-29 MED ORDER — 0.9 % SODIUM CHLORIDE (POUR BTL) OPTIME
TOPICAL | Status: DC | PRN
  Administered 2022-01-29: 2000 mL

## 2022-01-29 MED ORDER — OXYCODONE HCL 5 MG/5ML PO SOLN
5.0000 mg | Freq: Once | ORAL | Status: AC | PRN
  Administered 2022-01-29: 5 mg via ORAL

## 2022-01-29 MED ORDER — BISACODYL 5 MG PO TBEC: 20.0000 mg | DELAYED_RELEASE_TABLET | Freq: Once | ORAL | Status: DC

## 2022-01-29 MED ORDER — NEOMYCIN SULFATE 500 MG PO TABS: 1000.0000 mg | ORAL_TABLET | ORAL | Status: DC

## 2022-01-29 MED ORDER — DIPHENHYDRAMINE HCL 12.5 MG/5ML PO ELIX
12.5000 mg | ORAL_SOLUTION | Freq: Four times a day (QID) | ORAL | Status: DC | PRN
  Administered 2022-01-30: 12.5 mg via ORAL

## 2022-01-29 MED ORDER — INDOCYANINE GREEN 25 MG IV SOLR
INTRAVENOUS | Status: DC | PRN
  Administered 2022-01-29: 7.5 mg

## 2022-01-29 MED ORDER — OXYCODONE HCL 5 MG/5ML PO SOLN
ORAL | Status: AC
  Filled 2022-01-29: qty 5

## 2022-01-29 MED ORDER — MIDAZOLAM HCL 2 MG/2ML IJ SOLN
INTRAMUSCULAR | Status: DC | PRN
  Administered 2022-01-29: 2 mg via INTRAVENOUS

## 2022-01-29 MED ORDER — BUPIVACAINE LIPOSOME 1.3 % IJ SUSP
INTRAMUSCULAR | Status: AC
  Filled 2022-01-29: qty 20

## 2022-01-29 MED ORDER — METRONIDAZOLE 500 MG PO TABS: 1000.0000 mg | ORAL_TABLET | ORAL | Status: DC

## 2022-01-29 MED ORDER — DEXAMETHASONE SODIUM PHOSPHATE 10 MG/ML IJ SOLN
INTRAMUSCULAR | Status: DC | PRN
  Administered 2022-01-29: 4 mg via INTRAVENOUS

## 2022-01-29 MED ORDER — HYDRALAZINE HCL 20 MG/ML IJ SOLN: 10.0000 mg | INTRAMUSCULAR | Status: DC | PRN

## 2022-01-29 MED ORDER — NITROGLYCERIN 0.4 MG SL SUBL: 0.4000 mg | SUBLINGUAL_TABLET | SUBLINGUAL | Status: DC | PRN

## 2022-01-29 MED ORDER — SODIUM CHLORIDE 0.9 % IV SOLN
2.0000 g | INTRAVENOUS | Status: AC
  Administered 2022-01-29: 2 g via INTRAVENOUS
  Filled 2022-01-29: qty 2

## 2022-01-29 MED ORDER — FENTANYL CITRATE PF 50 MCG/ML IJ SOSY
PREFILLED_SYRINGE | INTRAMUSCULAR | Status: AC
  Filled 2022-01-29: qty 2

## 2022-01-29 MED ORDER — ENSURE SURGERY PO LIQD
237.0000 mL | Freq: Two times a day (BID) | ORAL | Status: DC
  Administered 2022-01-31 – 2022-02-01 (×3): 237 mL via ORAL

## 2022-01-29 MED ORDER — PHENYLEPHRINE HCL-NACL 20-0.9 MG/250ML-% IV SOLN
INTRAVENOUS | Status: DC | PRN
  Administered 2022-01-29: 20 ug/min via INTRAVENOUS

## 2022-01-29 MED ORDER — FENTANYL CITRATE (PF) 100 MCG/2ML IJ SOLN
INTRAMUSCULAR | Status: DC | PRN
  Administered 2022-01-29 (×3): 50 ug via INTRAVENOUS

## 2022-01-29 MED ORDER — ASPIRIN 81 MG PO TBEC
81.0000 mg | DELAYED_RELEASE_TABLET | Freq: Every day | ORAL | Status: DC
  Administered 2022-01-30 – 2022-02-02 (×4): 81 mg via ORAL
  Filled 2022-01-29 (×4): qty 1

## 2022-01-29 MED ORDER — PHENYLEPHRINE 80 MCG/ML (10ML) SYRINGE FOR IV PUSH (FOR BLOOD PRESSURE SUPPORT)
PREFILLED_SYRINGE | INTRAVENOUS | Status: DC | PRN
  Administered 2022-01-29: 80 ug via INTRAVENOUS

## 2022-01-29 SURGICAL SUPPLY — 134 items
ADAPTER GOLDBERG URETERAL (ADAPTER) IMPLANT
APPLIER CLIP 5 13 M/L LIGAMAX5 (MISCELLANEOUS)
APPLIER CLIP ROT 10 11.4 M/L (STAPLE)
BAG COUNTER SPONGE SURGICOUNT (BAG) IMPLANT
BAG URO CATCHER STRL LF (MISCELLANEOUS) ×2 IMPLANT
BINDER ABDOMINAL 12 ML 46-62 (SOFTGOODS) IMPLANT
CANNULA REDUC XI 12-8 STAPL (CANNULA) ×2
CANNULA REDUCER 12-8 DVNC XI (CANNULA) ×2 IMPLANT
CATH URETL OPEN 5X70 (CATHETERS) IMPLANT
CELLS DAT CNTRL 66122 CELL SVR (MISCELLANEOUS) IMPLANT
CHLORAPREP W/TINT 26 (MISCELLANEOUS) ×2 IMPLANT
CLIP APPLIE 5 13 M/L LIGAMAX5 (MISCELLANEOUS) IMPLANT
CLIP APPLIE ROT 10 11.4 M/L (STAPLE) IMPLANT
CLIP LIGATING HEM O LOK PURPLE (MISCELLANEOUS) IMPLANT
CLIP LIGATING HEMO O LOK GREEN (MISCELLANEOUS) IMPLANT
CLOTH BEACON ORANGE TIMEOUT ST (SAFETY) ×2 IMPLANT
COVER SURGICAL LIGHT HANDLE (MISCELLANEOUS) ×4 IMPLANT
COVER TIP SHEARS 8 DVNC (MISCELLANEOUS) ×2 IMPLANT
COVER TIP SHEARS 8MM DA VINCI (MISCELLANEOUS) ×2
DEFOGGER SCOPE WARMER CLEARIFY (MISCELLANEOUS) ×2 IMPLANT
DEVICE TROCAR PUNCTURE CLOSURE (ENDOMECHANICALS) IMPLANT
DRAIN CHANNEL 19F RND (DRAIN) ×2 IMPLANT
DRAPE ARM DVNC X/XI (DISPOSABLE) ×8 IMPLANT
DRAPE COLUMN DVNC XI (DISPOSABLE) ×2 IMPLANT
DRAPE DA VINCI XI ARM (DISPOSABLE) ×8
DRAPE DA VINCI XI COLUMN (DISPOSABLE) ×2
DRAPE SURG IRRIG POUCH 19X23 (DRAPES) ×2 IMPLANT
DRSG OPSITE POSTOP 4X10 (GAUZE/BANDAGES/DRESSINGS) IMPLANT
DRSG OPSITE POSTOP 4X6 (GAUZE/BANDAGES/DRESSINGS) IMPLANT
DRSG OPSITE POSTOP 4X8 (GAUZE/BANDAGES/DRESSINGS) IMPLANT
ELECT BLADE 6.5 EXT (BLADE) IMPLANT
ELECT BLADE TIP CTD 4 INCH (ELECTRODE) IMPLANT
ELECT COATED BLADE 2.86 ST (ELECTRODE) IMPLANT
ELECT REM PT RETURN 15FT ADLT (MISCELLANEOUS) ×2 IMPLANT
ENDOLOOP SUT PDS II  0 18 (SUTURE)
ENDOLOOP SUT PDS II 0 18 (SUTURE) IMPLANT
EVACUATOR SILICONE 100CC (DRAIN) ×2 IMPLANT
GAUZE SPONGE 2X2 8PLY STRL LF (GAUZE/BANDAGES/DRESSINGS) ×2 IMPLANT
GAUZE SPONGE 4X4 12PLY STRL (GAUZE/BANDAGES/DRESSINGS) IMPLANT
GLOVE BIO SURGEON STRL SZ7.5 (GLOVE) ×6 IMPLANT
GLOVE INDICATOR 8.0 STRL GRN (GLOVE) ×6 IMPLANT
GLOVE SURG LX STRL 7.5 STRW (GLOVE) ×2 IMPLANT
GOWN SRG XL LVL 4 BRTHBL STRL (GOWNS) ×2 IMPLANT
GOWN STRL NON-REIN XL LVL4 (GOWNS) ×2
GOWN STRL REUS W/ TWL LRG LVL3 (GOWN DISPOSABLE) ×2 IMPLANT
GOWN STRL REUS W/ TWL XL LVL3 (GOWN DISPOSABLE) ×10 IMPLANT
GOWN STRL REUS W/TWL LRG LVL3 (GOWN DISPOSABLE) ×2
GOWN STRL REUS W/TWL XL LVL3 (GOWN DISPOSABLE) ×10
GRASPER SUT TROCAR 14GX15 (MISCELLANEOUS) IMPLANT
GUIDEWIRE ANG ZIPWIRE 038X150 (WIRE) IMPLANT
GUIDEWIRE STR DUAL SENSOR (WIRE) IMPLANT
HOLDER FOLEY CATH W/STRAP (MISCELLANEOUS) ×2 IMPLANT
IRRIG SUCT STRYKERFLOW 2 WTIP (MISCELLANEOUS) ×2
IRRIGATION SUCT STRKRFLW 2 WTP (MISCELLANEOUS) ×2 IMPLANT
KIT PROCEDURE DA VINCI SI (MISCELLANEOUS) ×2
KIT PROCEDURE DVNC SI (MISCELLANEOUS) ×2 IMPLANT
KIT TURNOVER KIT A (KITS) IMPLANT
LIGASURE IMPACT 36 18CM CVD LR (INSTRUMENTS) IMPLANT
MANIFOLD NEPTUNE II (INSTRUMENTS) ×2 IMPLANT
NDL INSUFFLATION 14GA 120MM (NEEDLE) ×2 IMPLANT
NEEDLE INSUFFLATION 14GA 120MM (NEEDLE) ×2 IMPLANT
PACK CARDIOVASCULAR III (CUSTOM PROCEDURE TRAY) ×2 IMPLANT
PACK COLON (CUSTOM PROCEDURE TRAY) ×2 IMPLANT
PACK CYSTO (CUSTOM PROCEDURE TRAY) ×2 IMPLANT
PAD POSITIONING PINK XL (MISCELLANEOUS) ×2 IMPLANT
PENCIL SMOKE EVACUATOR (MISCELLANEOUS) IMPLANT
PROTECTOR NERVE ULNAR (MISCELLANEOUS) ×4 IMPLANT
RELOAD STAPLE 45 3.5 BLU DVNC (STAPLE) IMPLANT
RELOAD STAPLE 45 4.3 GRN DVNC (STAPLE) IMPLANT
RELOAD STAPLE 60 3.5 BLU DVNC (STAPLE) IMPLANT
RELOAD STAPLE 60 4.3 GRN DVNC (STAPLE) IMPLANT
RELOAD STAPLER 3.5X45 BLU DVNC (STAPLE) IMPLANT
RELOAD STAPLER 3.5X60 BLU DVNC (STAPLE) IMPLANT
RELOAD STAPLER 4.3X45 GRN DVNC (STAPLE) IMPLANT
RELOAD STAPLER 4.3X60 GRN DVNC (STAPLE) IMPLANT
RETRACTOR WND ALEXIS 18 MED (MISCELLANEOUS) IMPLANT
RTRCTR WOUND ALEXIS 18CM MED (MISCELLANEOUS)
SCISSORS LAP 5X35 DISP (ENDOMECHANICALS) IMPLANT
SEAL CANN UNIV 5-8 DVNC XI (MISCELLANEOUS) ×8 IMPLANT
SEAL XI 5MM-8MM UNIVERSAL (MISCELLANEOUS) ×8
SEALER VESSEL DA VINCI XI (MISCELLANEOUS) ×2
SEALER VESSEL EXT DVNC XI (MISCELLANEOUS) ×2 IMPLANT
SLEEVE ADV FIXATION 5X100MM (TROCAR) IMPLANT
SOLUTION ELECTROLUBE (MISCELLANEOUS) ×2 IMPLANT
SPIKE FLUID TRANSFER (MISCELLANEOUS) ×2 IMPLANT
SPONGE T-LAP 18X18 ~~LOC~~+RFID (SPONGE) IMPLANT
STAPLER CANNULA SEAL DVNC XI (STAPLE) ×2 IMPLANT
STAPLER CANNULA SEAL XI (STAPLE) ×2
STAPLER CVD CUT GN 40 RELOAD (ENDOMECHANICALS) ×2 IMPLANT
STAPLER CVD CUT GRN 40 RELOAD (ENDOMECHANICALS) IMPLANT
STAPLER ECHELON POWER CIR 29 (STAPLE) IMPLANT
STAPLER ECHELON POWER CIR 31 (STAPLE) IMPLANT
STAPLER PROXIMATE 75MM BLUE (STAPLE) IMPLANT
STAPLER RELOAD 3.5X45 BLU DVNC (STAPLE)
STAPLER RELOAD 3.5X45 BLUE (STAPLE)
STAPLER RELOAD 3.5X60 BLU DVNC (STAPLE)
STAPLER RELOAD 3.5X60 BLUE (STAPLE)
STAPLER RELOAD 4.3X45 GREEN (STAPLE)
STAPLER RELOAD 4.3X45 GRN DVNC (STAPLE)
STAPLER RELOAD 4.3X60 GREEN (STAPLE)
STAPLER RELOAD 4.3X60 GRN DVNC (STAPLE)
STAPLER VISISTAT 35W (STAPLE) IMPLANT
STOPCOCK 4 WAY LG BORE MALE ST (IV SETS) ×4 IMPLANT
SURGILUBE 2OZ TUBE FLIPTOP (MISCELLANEOUS) ×2 IMPLANT
SUT MNCRL AB 4-0 PS2 18 (SUTURE) ×2 IMPLANT
SUT PDS AB 1 CT1 27 (SUTURE) IMPLANT
SUT PDS AB 1 TP1 96 (SUTURE) IMPLANT
SUT PROLENE 0 CT 2 (SUTURE) IMPLANT
SUT PROLENE 2 0 KS (SUTURE) ×2 IMPLANT
SUT PROLENE 2 0 SH DA (SUTURE) IMPLANT
SUT SILK 2 0 (SUTURE)
SUT SILK 2 0 SH CR/8 (SUTURE) IMPLANT
SUT SILK 2-0 18XBRD TIE 12 (SUTURE) IMPLANT
SUT SILK 3 0 (SUTURE) ×2
SUT SILK 3 0 SH CR/8 (SUTURE) ×2 IMPLANT
SUT SILK 3-0 18XBRD TIE 12 (SUTURE) ×2 IMPLANT
SUT V-LOC BARB 180 2/0GR6 GS22 (SUTURE)
SUT VIC AB 3-0 SH 18 (SUTURE) IMPLANT
SUT VIC AB 3-0 SH 27 (SUTURE)
SUT VIC AB 3-0 SH 27XBRD (SUTURE) IMPLANT
SUT VICRYL 0 UR6 27IN ABS (SUTURE) ×2 IMPLANT
SUTURE V-LC BRB 180 2/0GR6GS22 (SUTURE) IMPLANT
SYR 10ML LL (SYRINGE) ×2 IMPLANT
SYS LAPSCP GELPORT 120MM (MISCELLANEOUS)
SYS WOUND ALEXIS 18CM MED (MISCELLANEOUS) ×2
SYSTEM LAPSCP GELPORT 120MM (MISCELLANEOUS) IMPLANT
SYSTEM WOUND ALEXIS 18CM MED (MISCELLANEOUS) ×2 IMPLANT
TAPE UMBILICAL 1/8 X36 TWILL (MISCELLANEOUS) ×2 IMPLANT
TOWEL OR NON WOVEN STRL DISP B (DISPOSABLE) ×2 IMPLANT
TRAY FOLEY MTR SLVR 16FR STAT (SET/KITS/TRAYS/PACK) ×2 IMPLANT
TROCAR ADV FIXATION 5X100MM (TROCAR) ×2 IMPLANT
TUBING CONNECTING 10 (TUBING) ×6 IMPLANT
TUBING INSUFFLATION 10FT LAP (TUBING) ×2 IMPLANT
TUBING UROLOGY SET (TUBING) IMPLANT

## 2022-01-29 NOTE — Anesthesia Procedure Notes (Signed)
Procedure Name: Intubation Date/Time: 01/29/2022 8:40 AM  Performed by: Eben Burow, CRNAPre-anesthesia Checklist: Patient identified, Emergency Drugs available, Suction available, Patient being monitored and Timeout performed Patient Re-evaluated:Patient Re-evaluated prior to induction Oxygen Delivery Method: Circle system utilized Preoxygenation: Pre-oxygenation with 100% oxygen Induction Type: IV induction Ventilation: Mask ventilation without difficulty Laryngoscope Size: Mac and 4 Grade View: Grade I Tube type: Oral Tube size: 7.0 mm Number of attempts: 1 Airway Equipment and Method: Stylet Placement Confirmation: ETT inserted through vocal cords under direct vision, positive ETCO2 and breath sounds checked- equal and bilateral Secured at: 21 cm Tube secured with: Tape Dental Injury: Teeth and Oropharynx as per pre-operative assessment

## 2022-01-29 NOTE — Transfer of Care (Signed)
Immediate Anesthesia Transfer of Care Note  Patient: Sabrie Moritz  Procedure(s) Performed: ROBOTIC COVERTED TO OPEN LOWER ANTERIOR RESECTION, LYSIS OF ADHESIONS WITH TAKEDOWN OF LOOP COLOSTOMY FLEXIBLE SIGMOIDOSCOPY (Rectum) CYSTOSCOPY with FIREFLY INJECTION  Patient Location: PACU  Anesthesia Type:General  Level of Consciousness: awake, alert  and patient cooperative  Airway & Oxygen Therapy: Patient Spontanous Breathing and Patient connected to face mask oxygen  Post-op Assessment: Report given to RN and Post -op Vital signs reviewed and stable  Post vital signs: Reviewed and stable  Last Vitals:  Vitals Value Taken Time  BP 158/75 01/29/22 1223  Temp    Pulse 72 01/29/22 1225  Resp 8 01/29/22 1225  SpO2 100 % 01/29/22 1225  Vitals shown include unvalidated device data.  Last Pain:  Vitals:   01/29/22 0752  TempSrc:   PainSc: 0-No pain         Complications: No notable events documented.

## 2022-01-29 NOTE — Anesthesia Postprocedure Evaluation (Signed)
Anesthesia Post Note  Patient: Valerie Fields  Procedure(s) Performed: ROBOTIC COVERTED TO OPEN LOWER ANTERIOR RESECTION, LYSIS OF ADHESIONS WITH TAKEDOWN OF LOOP COLOSTOMY FLEXIBLE SIGMOIDOSCOPY (Rectum) CYSTOSCOPY with FIREFLY INJECTION     Patient location during evaluation: PACU Anesthesia Type: General Level of consciousness: awake and alert Pain management: pain level controlled Vital Signs Assessment: post-procedure vital signs reviewed and stable Respiratory status: spontaneous breathing, nonlabored ventilation and respiratory function stable Cardiovascular status: stable and blood pressure returned to baseline Anesthetic complications: no   No notable events documented.  Last Vitals:  Vitals:   01/29/22 1546 01/29/22 1618  BP:  (!) 107/59  Pulse:  70  Resp: 18 14  Temp:  36.6 C  SpO2: 93% 96%    Last Pain:  Vitals:   01/29/22 1618  TempSrc: Oral  PainSc:                  Audry Pili

## 2022-01-29 NOTE — H&P (Signed)
CC: Here today for surgery  HPI: Valerie Fields is an 71 y.o. female with hx of HTN, HLD, CAD (s/p NSTEMI 2021 --> DES), ischemic cardiomyopathy, OSA (on CPAP), whom is seen in the office today as a referral by Dr. Kae Heller in our practice for specialty evaluation.  OR 05/20/21 with Dr. Kae Heller - exploratory laparotomy, right hemicolectomy, loop sigmoid colostomy. She was found to have a "very distal colonic stricture extremely deep in pelvis" perforated cecum. PATH: Focal colonic necrosis associated with perforation. Benign appendix. Colonic segment with diverticulosis. No evidence of malignancy.  CT A/P 9/32/67 - ileocolic anastomosis in right hemiabdomen; loop colostomy. Small fluid collections in right abdomen and left paracolic gutter (4 x 2 x 7 cm). Gallstones.   She follows with cardiology Dr. Lauree Chandler.  Flex sig 05/19/2021 with Dr. Henrene Pastor showed high-grade benign-appearing intrinsic stricture of the distal sigmoid colon that could not be traversed with the pediatric scope. No evidence of cancer.  She underwent colonoscopy with Dr. Therisa Doyne with [facility]. This was on 10/29/2021: She was able to evaluate both the rectal remnant containing her sigmoid and as well as going through her ostomy and view her ileocolic anastomosis. Preparation fair Stool in the rectosigmoid colon and in the sigmoid colon.  Patent loop colostomy with healthy-appearing mucosa in the sigmoid colon. Diverticulosis in the descending and transverse Patent end to side ileocolic anastomosis, healthy appearing Examined portion of the ileum was normal  **Photos which we have a copy of in colorized form show a healthy appearing rectum. There is some visible stool within the sigmoid with the photo diagram indicating they were in the rectosigmoid colon.  She does have some issues regarding her skin at the ostomy site being slightly narrow. She reports that she was told during her colonoscopy that it was dilated  to a fingerbreadth in diameter. She is not currently dilating anything but has stool coming out of it daily. She also reports 2-3 bowel movements a week via the anus. This is generally brown stool. She is taking daily MiraLAX and prune juice. She denies any abdominal pain, nausea or vomiting.  GGE 11/15/2021: Colonic narrowing near the rectosigmoid junction/distal sigmoid. Area above narrowing difficult to assess. She has been doing well. After thinking about everything, she would like to proceed with an attempt at surgery. She understands that if there is significant retroperitoneal fibrosis or things are deemed to place her surgery at a much higher morbidity rate, the procedure could be aborted. If that were the case, we would plan to at least locally revise her colostomy given the intrinsic stenosis at the skin level.  Cardiac clearance obtained from cardiology with plans to hold Plavix 5 days prior to surgery.  She has been gently inserting the tip of a tapered candle stick into the ostomy os to keep it patent and it has been working well.  INTERVAL HX She denies any changes in her health or health history since we met in the office. Tolerated bowel prep with satisfactory result. Feeling well. No abdominal pain, nausea, vomiting.   PMH: HTN, HLD, CAD (s/p NSTEMI 2021 --> DES), ischemic cardiomyopathy, OSA (on CPAP)  PSH: Exlap with right hemicolectomy and ileocolic anastomosis + loop sigmoid colostomy for rectosigmoid presumed diverticular stricture.  FHx: Denies any known family history of colorectal, breast, endometrial or ovarian cancer  Social Hx: Denies use of tobacco/EtOH/illicit drug. She is happily retired. She enjoys traveling the world. Went to [place] and [place] back in June. She  has plans to go to [place] in October.   Past Medical History:  Diagnosis Date   Allergy    Arthritis    Asthma    Chronic combined systolic and diastolic CHF 42/87/6811   Ischemic CM //  Echocardiogram 7/21: EF 40-45, Gr 2 DD, mid to apical ant/ant-sept AK, RVSP 23.4, trivial MR   Coronary artery disease    NSTEMI 7/21: oLAD 30, pLAD 99 >> PCI: DES; D1 90 >> PCI: POBA, prox and mid RCA 15, EF 35   Diverticulitis    Gallbladder problem    GERD (gastroesophageal reflux disease)    History of heart attack    Hyperlipidemia 10/15/2019   Zetia DC'd during admit for NSTEMI 7/21 >> ? to Atorvastatin   IBS (irritable colon syndrome)    Ischemic cardiomyopathy 10/15/2019   Joint pain    Lactose intolerance    Multiple food allergies    Non-ST elevation (NSTEMI) MI 10/13/2019   PCI:  DES to LAD and POBA to D1    Past Surgical History:  Procedure Laterality Date   BREAST BIOPSY Left    No Scar seen    BREAST CYST EXCISION Left    No scar seen    CARDIAC CATHETERIZATION     COLONOSCOPY     CORONARY STENT INTERVENTION  10/13/2019   CORONARY STENT INTERVENTION N/A 10/13/2019   Procedure: CORONARY STENT INTERVENTION;  Surgeon: Nelva Bush, MD;  Location: [facility];  Service: Cardiovascular;  Laterality: N/A;  mid [place] N/A 05/19/2021   Procedure: FLEXIBLE SIGMOIDOSCOPY;  Surgeon: Irene Shipper, MD;  [facility];  Service: Endoscopy;  Laterality: N/A;   LAPAROTOMY N/A 05/20/2021   Procedure: EXPLORATORY LAPAROTOMY WITH RIGHT HEMI  COLECTOMY; CREATION OF LOOP SIGMOID COLOSTOMY;  Surgeon: Clovis Riley, MD;  [facility];  Service: General;  Laterality: N/A;   LEFT HEART CATH AND CORONARY ANGIOGRAPHY N/A 10/13/2019   Procedure: LEFT HEART CATH AND CORONARY ANGIOGRAPHY;  Surgeon: Nelva Bush, MD;  Location: [facility];  Service: Cardiovascular;  Laterality: N/A;   TONSILLECTOMY      Family History  Problem Relation Age of Onset   GER disease Father    Aneurysm Mother    Hyperlipidemia Mother    Obesity Mother    Stomach cancer Maternal Grandfather    Colon cancer Neg Hx    Esophageal cancer Neg Hx     Pancreatic cancer Neg Hx    Rectal cancer Neg Hx    Breast cancer Neg Hx     Social:  reports that she has never smoked. She has never used smokeless tobacco. She reports current alcohol use of about 1.0 standard drink of alcohol per week. She reports that she does not use drugs.  Allergies:  Allergies  Allergen Reactions   Sulfa Antibiotics Other (See Comments)    As a child, turned red above neck    Medications: I have reviewed the patient's current medications.  Results for orders placed or performed during the hospital encounter of 01/29/22 (from the past 48 hour(s))  ABO/Rh     Status: None   Collection Time: 01/29/22  7:08 AM  Result Value Ref Range   ABO/RH(D)      O POS Performed at [facility], [place] [address] [place], [place] [address]   CBC with Differential     Status: None   Collection Time: 01/29/22  7:08 AM  Result Value Ref Range  WBC 8.8 4.0 - 10.5 K/uL   RBC 4.73 3.87 - 5.11 MIL/uL   Hemoglobin 15.0 12.0 - 15.0 g/dL   HCT 45.0 36.0 - 46.0 %   MCV 95.1 80.0 - 100.0 fL   MCH 31.7 26.0 - 34.0 pg   MCHC 33.3 30.0 - 36.0 g/dL   RDW 13.3 11.5 - 15.5 %   Platelets 325 150 - 400 K/uL   nRBC 0.0 0.0 - 0.2 %   Neutrophils Relative % 65 %   Neutro Abs 5.6 1.7 - 7.7 K/uL   Lymphocytes Relative 26 %   Lymphs Abs 2.3 0.7 - 4.0 K/uL   Monocytes Relative 8 %   Monocytes Absolute 0.7 0.1 - 1.0 K/uL   Eosinophils Relative 1 %   Eosinophils Absolute 0.1 0.0 - 0.5 K/uL   Basophils Relative 0 %   Basophils Absolute 0.0 0.0 - 0.1 K/uL   Immature Granulocytes 0 %   Abs Immature Granulocytes 0.02 0.00 - 0.07 K/uL    Comment: Performed at [facility], [place] [address] [place], [place] [address]  Comprehensive metabolic panel     Status: Abnormal   Collection Time: 01/29/22  7:08 AM  Result Value Ref Range   Sodium 136 135 - 145 mmol/L   Potassium 3.4 (L) 3.5 - 5.1 mmol/L   Chloride 106 98 - 111 mmol/L   CO2 21 (L) 22 - 32 mmol/L    Glucose, Bld 124 (H) 70 - 99 mg/dL    Comment: Glucose reference range applies only to samples taken after fasting for at least 8 hours.   BUN 13 8 - 23 mg/dL   Creatinine, Ser 0.99 0.44 - 1.00 mg/dL   Calcium 9.7 8.9 - 10.3 mg/dL   Total Protein 7.8 6.5 - 8.1 g/dL   Albumin 4.0 3.5 - 5.0 g/dL   AST 28 15 - 41 U/L   ALT 30 0 - 44 U/L   Alkaline Phosphatase 70 38 - 126 U/L   Total Bilirubin 0.8 0.3 - 1.2 mg/dL   GFR, Estimated >60 >60 mL/min    Comment: (NOTE) Calculated using the CKD-EPI Creatinine Equation (2021)    Anion gap 9 5 - 15    Comment: Performed at [facility], [place] [address] [place], [place] [address]    No results found.   PE Blood pressure (!) 141/69, pulse 67, temperature 98.5 F (36.9 C), temperature source Oral, resp. rate 16, height _0  (1.575 m), weight 94 kg, SpO2 96 %. Constitutional: NAD; conversant Eyes: Moist conjunctiva Lungs: Normal respiratory effort CV: RRR GI: Abd soft, NT/ND MSK: Normal range of motion of extremities Psychiatric: Appropriate affect   Results for orders placed or performed during the hospital encounter of 01/29/22 (from the past 48 hour(s))  ABO/Rh     Status: None   Collection Time: 01/29/22  7:08 AM  Result Value Ref Range   ABO/RH(D)      O POS Performed at [facility] [address], [place], [place] [address]   CBC with Differential     Status: None   Collection Time: 01/29/22  7:08 AM  Result Value Ref Range   WBC 8.8 4.0 - 10.5 K/uL   RBC 4.73 3.87 - 5.11 MIL/uL   Hemoglobin 15.0 12.0 - 15.0 g/dL   HCT 45.0 36.0 - 46.0 %   MCV 95.1 80.0 - 100.0 fL   MCH 31.7 26.0 - 34.0 pg   MCHC 33.3 30.0 - 36.0  g/dL   RDW 13.3 11.5 - 15.5 %   Platelets 325 150 - 400 K/uL   nRBC 0.0 0.0 - 0.2 %   Neutrophils Relative % 65 %   Neutro Abs 5.6 1.7 - 7.7 K/uL   Lymphocytes Relative 26 %   Lymphs Abs 2.3 0.7 - 4.0 K/uL   Monocytes Relative 8 %   Monocytes Absolute 0.7 0.1 - 1.0  K/uL   Eosinophils Relative 1 %   Eosinophils Absolute 0.1 0.0 - 0.5 K/uL   Basophils Relative 0 %   Basophils Absolute 0.0 0.0 - 0.1 K/uL   Immature Granulocytes 0 %   Abs Immature Granulocytes 0.02 0.00 - 0.07 K/uL    Comment: Performed at [facility], [place] [address] [place], [place] [address]  Comprehensive metabolic panel     Status: Abnormal   Collection Time: 01/29/22  7:08 AM  Result Value Ref Range   Sodium 136 135 - 145 mmol/L   Potassium 3.4 (L) 3.5 - 5.1 mmol/L   Chloride 106 98 - 111 mmol/L   CO2 21 (L) 22 - 32 mmol/L   Glucose, Bld 124 (H) 70 - 99 mg/dL    Comment: Glucose reference range applies only to samples taken after fasting for at least 8 hours.   BUN 13 8 - 23 mg/dL   Creatinine, Ser 0.99 0.44 - 1.00 mg/dL   Calcium 9.7 8.9 - 10.3 mg/dL   Total Protein 7.8 6.5 - 8.1 g/dL   Albumin 4.0 3.5 - 5.0 g/dL   AST 28 15 - 41 U/L   ALT 30 0 - 44 U/L   Alkaline Phosphatase 70 38 - 126 U/L   Total Bilirubin 0.8 0.3 - 1.2 mg/dL   GFR, Estimated >60 >60 mL/min    Comment: (NOTE) Calculated using the CKD-EPI Creatinine Equation (2021)    Anion gap 9 5 - 15    Comment: Performed at [facility], [place] [address], [place], [place] [address]    No results found.  A/P: Valerie Fields is an 71 y.o. female with hx of HTN, HLD, CAD (s/p NSTEMI 2021 --> DES), ischemic cardiomyopathy, OSA (on CPAP), here for follow-up eval - loop colostomy status in setting of distal obstruction from presumed benign diverticular stricture of rectosigmoid colon Elap/R hemi/Loop sigmoid 05/2021  Cscope 10/29/21 -normal-appearing rectum, diverticulosis but otherwise normal colon. Healthy/patent ileocolic anastomosis. Normal-appearing neoterminal ileum.   -GGE Clear - narrowing in rectosigmoid, patent to contrast at present  -Cardiac clearance obtained  -We spent time today discussing her prior procedure with Dr. Windle Guard and her current anatomy as it  pertains to her having both an ileocolic anastomosis and a loop sigmoid colostomy. We discussed that the stricture that likely caused her colon perforation remains in situ. We discussed that ostomy reversal in this case would involve resection of the diseased segment of rectosigmoid colon, takedown of her loop colostomy with colorectal anastomosis. We discussed an approximate 10 - 15% chance of significant retroperitoneal/pelvic fibrosis that may preclude safe dissection and ultimately procedural abortion. -She was under the impression that it would be a skin level procedure we reconnect her colon and dunk back into her abdomen. We discussed why this would not be feasible in her case with having a stricture in situ.  After thinking about everything, she would like to proceed with an attempt at surgery. She understands that if there is significant retroperitoneal fibrosis or things are deemed to place her surgery at a much  higher morbidity rate, the procedure may be aborted. If that were the case, we will plan to at least locally revise her colostomy given the intrinsic stenosis at the skin level.   -We also spent time discussing the above surgery with regards to what a colostomy takedown and what segmental resection of the rectosigmoid stricture would involve, attempt at minimally invasive/robotic surgery ideally; flexible sigmoidoscopy, ureteral indocyanine green/firefly ([facility]). Colostomy stenosis revision if we are unable to successfully resect the involved segment of distal sigmoid colon. All re-discussed with her today which she expresses clear understanding of.  The planned procedure, material risks (including, but not limited to, pain, bleeding, infection, scarring, need for blood transfusion, damage to surrounding structures- blood vessels/nerves/viscus/organs, damage to ureter, urine leak, leak from anastomosis, need for additional procedures, conversion to open surgery, scenarios where a  stoma may be necessary and where it may be permanent, worsening of pre-existing medical conditions, hernia, recurrence of diverticulitis, pneumonia, heart attack, stroke, death) benefits and alternatives to surgery were discussed at length. The patient's questions were answered to her satisfaction, she voiced understanding. Additionally, we discussed typical postoperative expectations and the recovery process.   Nadeen Landau, [facility], [place]

## 2022-01-29 NOTE — Op Note (Signed)
Operative Note  Preoperative diagnosis:  1.  Colostomy following cecal perforation   Postoperative diagnosis: 1.  Colostomy following cecal perforation   Procedure(s): 1.  Cystoscopy with bilateral ureteral FireFly injections  Surgeon: Ellison Hughs, MD  Assistants:  None   Anesthesia:  General  Complications:  None  EBL:  <5 mL  Specimens: 1. None  Drains/Catheters: 1.  67 French Foley  Intraoperative findings:   No intravesical pathology was seen on cystoscopy  Indication:  The patient is a 71 year old female with a history of  exploratory laparotomy, right hemicolectomy, loop sigmoid colostomy requiring a partial colectomy and is here today with Dr. Dema Severin for colostomy take down.  Urology has been consulted to performed cystoscopy with bilateral ureteral Fire Fly injection to aide in intraoperative ureteral identification. The patient has been consented for the above procedures, voices understanding and wishes to proceed.  The patient has been consented for the above procedures, voices understanding and wishes to procede  Description of procedure: After informed consent was obtained, the patient was brought to the operating room and general endotracheal anesthesia was administered. The patient was then placed in the dorsolithotomy position and prepped and draped in usual sterile fashion. A timeout was performed. A 21 French rigid cystoscope was then inserted into the urethral meatus and advanced into the bladder under direct vision. A complete bladder survey revealed no intravesical pathology.   A 6 [place] open-ended catheter was then used to intubate the right ureteral orifice and a total of 7.5 mL of firefly solution diluted with 10 mL of saline was injected into the right collecting system. A similar maneuver was then carried out on the left with the same volume and concentration of firefly. The rigid cystoscope was then removed under direct vision. A 16 French Foley  catheter was then inserted and placed to gravity drainage. The patient tolerated the procedure well. Dr. Dema Severin then proceeded with their portion of the case  Plan:  Foley removal is at the discretion of the primary team.

## 2022-01-29 NOTE — H&P (Signed)
Urology Preoperative H&P   Chief Complaint: colostomy take down  History of Present Illness: Valerie Fields is a 71 y.o. female here today for a colostomy take down with Dr. Dema Severin.  Urology has been consulted to inject ureteral Fire Fly.  OR 05/20/21 with Dr. Kae Heller - exploratory laparotomy, right hemicolectomy, loop sigmoid colostomy. She was found to have a "very distal colonic stricture extremely deep in pelvis" perforated cecum.  She has no prior GU history and denies any recent UTIs, dysuria or hematuria.   Past Medical History:  Diagnosis Date   Allergy    Arthritis    Asthma    Chronic combined systolic and diastolic CHF 62/69/4854   Ischemic CM // Echocardiogram 7/21: EF 40-45, Gr 2 DD, mid to apical ant/ant-sept AK, RVSP 23.4, trivial MR   Coronary artery disease    NSTEMI 7/21: oLAD 30, pLAD 99 >> PCI: DES; D1 90 >> PCI: POBA, prox and mid RCA 15, EF 35   Diverticulitis    Gallbladder problem    GERD (gastroesophageal reflux disease)    History of heart attack    Hyperlipidemia 10/15/2019   Zetia DC'd during admit for NSTEMI 7/21 >> ? to Atorvastatin   IBS (irritable colon syndrome)    Ischemic cardiomyopathy 10/15/2019   Joint pain    Lactose intolerance    Multiple food allergies    Non-ST elevation (NSTEMI) MI 10/13/2019   PCI:  DES to LAD and POBA to D1    Past Surgical History:  Procedure Laterality Date   BREAST BIOPSY Left    No Scar seen    BREAST CYST EXCISION Left    No scar seen    CARDIAC CATHETERIZATION     COLONOSCOPY     CORONARY STENT INTERVENTION  10/13/2019   CORONARY STENT INTERVENTION N/A 10/13/2019   Procedure: CORONARY STENT INTERVENTION;  Surgeon: Nelva Bush, MD;  Location: [facility];  Service: Cardiovascular;  Laterality: N/A;  mid [place] N/A 05/19/2021   Procedure: FLEXIBLE SIGMOIDOSCOPY;  Surgeon: Irene Shipper, MD;  Location: [facility];  Service: Endoscopy;  Laterality: N/A;   LAPAROTOMY N/A  05/20/2021   Procedure: EXPLORATORY LAPAROTOMY WITH RIGHT HEMI  COLECTOMY; CREATION OF LOOP SIGMOID COLOSTOMY;  Surgeon: Clovis Riley, MD;  Location: [facility];  Service: General;  Laterality: N/A;   LEFT HEART CATH AND CORONARY ANGIOGRAPHY N/A 10/13/2019   Procedure: LEFT HEART CATH AND CORONARY ANGIOGRAPHY;  Surgeon: Nelva Bush, MD;  Location: [facility];  Service: Cardiovascular;  Laterality: N/A;   TONSILLECTOMY      Allergies:  Allergies  Allergen Reactions   Sulfa Antibiotics Other (See Comments)    As a child, turned red above neck    Family History  Problem Relation Age of Onset   GER disease Father    Aneurysm Mother    Hyperlipidemia Mother    Obesity Mother    Stomach cancer Maternal Grandfather    Colon cancer Neg Hx    Esophageal cancer Neg Hx    Pancreatic cancer Neg Hx    Rectal cancer Neg Hx    Breast cancer Neg Hx     Social History:  reports that she has never smoked. She has never used smokeless tobacco. She reports current alcohol use of about 1.0 standard drink of alcohol per week. She reports that she does not use drugs.  ROS: A complete review of systems was performed.  All systems are negative except for  pertinent findings as noted.  Physical Exam:  Vital signs in last 24 hours: Temp:  [98.5 F (36.9 C)] 98.5 F (36.9 C) (11/01 0636) Pulse Rate:  [67] 67 (11/01 0636) Resp:  [16] 16 (11/01 0636) BP: (141)/(69) 141/69 (11/01 0636) SpO2:  [96 %] 96 % (11/01 0636) Weight:  [94 kg] 94 kg (11/01 0752) Constitutional:  Alert and oriented, No acute distress Cardiovascular: Regular rate and rhythm, No JVD Respiratory: Normal respiratory effort, Lungs clear bilaterally GI: Abdomen is soft, nontender, nondistended, no abdominal masses GU: No CVA tenderness Lymphatic: No lymphadenopathy Neurologic: Grossly intact, no focal deficits Psychiatric: Normal mood and affect  Laboratory Data:  Recent Labs    01/29/22 0708  WBC 8.8  HGB 15.0   HCT 45.0  PLT 325    Recent Labs    01/29/22 0708  NA 136  K 3.4*  CL 106  GLUCOSE 124*  BUN 13  CALCIUM 9.7  CREATININE 0.99     Results for orders placed or performed during the hospital encounter of 01/29/22 (from the past 24 hour(s))  ABO/Rh     Status: None   Collection Time: 01/29/22  7:08 AM  Result Value Ref Range   ABO/RH(D)      O POS Performed at [facility], [place] [address] [place], [place] [address]   CBC with Differential     Status: None   Collection Time: 01/29/22  7:08 AM  Result Value Ref Range   WBC 8.8 4.0 - 10.5 K/uL   RBC 4.73 3.87 - 5.11 MIL/uL   Hemoglobin 15.0 12.0 - 15.0 g/dL   HCT 45.0 36.0 - 46.0 %   MCV 95.1 80.0 - 100.0 fL   MCH 31.7 26.0 - 34.0 pg   MCHC 33.3 30.0 - 36.0 g/dL   RDW 13.3 11.5 - 15.5 %   Platelets 325 150 - 400 K/uL   nRBC 0.0 0.0 - 0.2 %   Neutrophils Relative % 65 %   Neutro Abs 5.6 1.7 - 7.7 K/uL   Lymphocytes Relative 26 %   Lymphs Abs 2.3 0.7 - 4.0 K/uL   Monocytes Relative 8 %   Monocytes Absolute 0.7 0.1 - 1.0 K/uL   Eosinophils Relative 1 %   Eosinophils Absolute 0.1 0.0 - 0.5 K/uL   Basophils Relative 0 %   Basophils Absolute 0.0 0.0 - 0.1 K/uL   Immature Granulocytes 0 %   Abs Immature Granulocytes 0.02 0.00 - 0.07 K/uL  Comprehensive metabolic panel     Status: Abnormal   Collection Time: 01/29/22  7:08 AM  Result Value Ref Range   Sodium 136 135 - 145 mmol/L   Potassium 3.4 (L) 3.5 - 5.1 mmol/L   Chloride 106 98 - 111 mmol/L   CO2 21 (L) 22 - 32 mmol/L   Glucose, Bld 124 (H) 70 - 99 mg/dL   BUN 13 8 - 23 mg/dL   Creatinine, Ser 0.99 0.44 - 1.00 mg/dL   Calcium 9.7 8.9 - 10.3 mg/dL   Total Protein 7.8 6.5 - 8.1 g/dL   Albumin 4.0 3.5 - 5.0 g/dL   AST 28 15 - 41 U/L   ALT 30 0 - 44 U/L   Alkaline Phosphatase 70 38 - 126 U/L   Total Bilirubin 0.8 0.3 - 1.2 mg/dL   GFR, Estimated >60 >60 mL/min   Anion gap 9 5 - 15   No results found for this or any previous visit (from  the past 240 hour(s)).  Renal Function: Recent Labs    01/24/22 1430 01/29/22 0708  CREATININE 0.84 0.99   Estimated Creatinine Clearance: 56.5 mL/min (by C-G formula based on SCr of 0.99 mg/dL).  Radiologic Imaging: No results found.  I independently reviewed the above imaging studies.  Assessment and Plan Jarah Pember is a 71 y.o. female here for colostomy take down with Dr. Dema Severin   The risks, benefits and alternatives of cystoscopy with bilateral Fire Fly injections was discussed with the patient.  She voices understanding and wishes to proceed.   Ellison Hughs, MD 01/29/2022, 8:30 AM  [facility] Pager: [phone]

## 2022-01-29 NOTE — Op Note (Signed)
PATIENT: Valerie Fields  71 y.o. female  Patient Care Team: Marda Stalker, PA-C as PCP - General (Family Medicine) Burnell Blanks, MD as PCP - Cardiology (Cardiology)  PREOP DIAGNOSIS: COLOSTOMY STATUS  POSTOP DIAGNOSIS: COLOSTOMY STATUS  PROCEDURE:  Robotic converted to open low anterior resection Takedown of loop colostomy Lysis of adhesions x 120 minutes Diagnostic flexible sigmoidoscopy - necessary to confirm pelvic anatomy and identify rectum Bilateral transversus abdominus plane (TAP) blocks  SURGEON: Sharon Mt. Dina Warbington, MD  ASSISTANT: Leighton Ruff, MD  ANESTHESIA: General endotracheal  EBL: 250 mL Total I/O In: 2351.8 [I.V.:2251.8; IV Piggyback:100] Out: 500 [Urine:250; Blood:250]  DRAINS: None  SPECIMEN: Rectosigmoid colon - stitch marks proximal staple line  COUNTS: Sponge, needle and instrument counts were reported correct x2  FINDINGS: Dense intra-abdominal adhesions requiring conversion to open surgery. Sigmoid colon adherent in pelvis from prior diverticulitis. Flexible sigmoidoscopy confirmed rectum proper and that no apparent injuries are present in the remnant rectum. Low anterior resection was necessary to obtain healthy distal margin away from involved sigmoid.   A well perfused, tension free, hemostatic, air tight 29 mm EEA colorectal anastomosis fashioned 12 cm from the anal verge by flexible sigmoidoscopy.   NARRATIVE: Informed consent was verified. The patient was taken to the operating room, placed supine on the operating table and SCD's were applied. General endotracheal anesthesia was induced without difficulty. She was then positioned in the lithotomy position with Allen stirrups.  Pressure points were evaluated and padded.  Hair on the abdomen was clipped.  She was secured to the operating table. Dr. Lovena Neighbours with Urology scrubbed for his portion of the procedure.  The abdomen was then prepped and draped in the standard sterile  fashion. Surgical timeout was called indicating the correct patient, procedure, positioning and need for preoperative antibiotics.   An OG tube was placed by anesthesia and confirmed to be to suction.  At Palmer's point, a stab incision was created and the Veress needle was introduced into the peritoneal cavity on the first attempt.  Intraperitoneal location was confirmed by the aspiration and saline drop test.  Pneumoperitoneum was established to a maximum pressure of 15 mmHg using CO2. Only the left side of the abdomen appeared to insufflate. We then moved the Veress needle laterally but achieved the same result.   We opted to proceed with a cut down well above her prior laparotomy.  The skin was incised.  The fascia was incised in the midline and the peritoneal cavity was gained.  We again were able to see that the left side of the abdomen is somewhat free but there are dense adhesions across the right hemiabdomen.  Omental containing lesions are swept down.  An [place] wound protector was placed and Was placed over this.  Pneumoperitoneum was established.  The laparoscope was inserted.  Became evident there were dense adhesions across much of her lower abdomen and her entire right abdomen.  We were not able to gain appropriate working space to safely access the abdomen with laparoscopic trocars.  We then opted to convert to open.  The scar of her prior laparotomy was partially utilized.  We excised the scar at the location we needed and carried this down to the level of the pubic symphysis.  The fascia is incised.  We then systematically cleared adhesions from the midline as we worked our way down.  Ultimately had cleared the midline of all adhesions.  Adhesions were then cleared laterally on both the right and left abdomen.  We were able to then place a Bookwalter retractor.  The patient is then repositioned in Trendelenburg.  We then commenced adhesiolysis of small bowel containing he is in the pelvis.   Omental adhesions were also freed.  We are then able to identify the sigmoid colon entering the pelvis.  All adhesions have been freed from this.  We began by scoring the mesentery on the right side of the sigmoid colon.  There is a dense apparent diverticular type stricture in her pelvis just below the level of the promontory.  Were able to carefully mobilize this out.  Using a robotic camera we are able to use the near infrared light to visualize both the left and right ureters which are well away from her planned dissection.  The sigmoid colon was then retracted out of the pelvis.  We were able to bluntly break up adhesions from the left and right fallopian tube.  We then retracted the uterus anteriorly and carried our planned dissection down to the level of the proximal rectum.  Were able to get her apparent stricture up out of the pelvis.  We are able to get down to healthy supple rectum.  We dissected the circumferentially.  A green load contour stapler was then used to divide the proximal rectum.  The staple line is inspected and noted to be intact and complete.  Ultimately, we did go below and I passed the flexible sigmoidoscope through the anus under direct visualization.  There was some stool that we are able to remove digitally.  The stump is healthy in appearance without any apparent defects or blood in the lumen.  Doing a reverse leak test, there are no bubbles emanating from the pelvis either. Gowns/gloves are changed and I scrubbed back in.   The sigmoid colon proximal to the stricture was mobilized all the way up to the level of the colostomy.  We then directed our attention to taking down the colostomy.  The skin is incised circumferentially.  The colostomy is mobilized from the surrounding tissues without difficulty and placed back into the abdomen.  A bowel clamp was placed over the colostomy site to prevent any spillage.  The descending colon was mobilized up to the level of the splenic  flexure by incising the Daimion Adamcik line of Toldt.  She had a fairly redundant descending colon likely from her multi month loop colostomy.  She does have a fairly large parastomal hernia as well that is empty.  The location of her loop colostomy easily reaches into her deep pelvis without any tension and remains in that location.  It was apparent that no further mobilization would be necessary.  The colon just distal to the loop colostomy site is dissected circumferentially and divided using a linear cutting GIA blue load stapler.  Staple line is inspected and noted to be intact.  We went on ahead and removed her loop colostomy sharply and opted to use this as location for placement of our anvil of her stapler as this is on the antimesenteric border.  There is a palpable pulse in the mesentery going up to this level.  All of her colon is pink and healthy in appearance.  A 20 mm EEA is selected and the anvil was placed.  A pursestring of 2-0 Prolene was fashioned prior to placing the anvil.  This was then tied.  The pursestring is palpated and there is no significant fat within the planned anastomosis.  The intervening mesentery between her proximalmost rectum and  the divided colon proximally is divided using a LigaSure device taking care to hug the colon while doing this.  We then palpated the mesentery and there is again a palpable pulse going out to the staple line just beyond the ankle.  I then went below to pass the stapler.  A 20 mm stapler was then inserted through the anus.  This is advanced to the level of the proximal rectum where it had been divided.  The spike is then deployed just anterior to the staple line and the components were mated.  The colon is inspected for orientation and there is no twisting or apparent small bowel underneath the cut edge of the mesentery.  Retractors in place to again assist with keeping the uterus anterior and we ensured the fallopian tube and ovaries are out over stapler.   The stapler was then closed, held, and fired.  The donuts are then inspected and noted to be intact and complete.  Attention was then directed at performing a leak test.  The colon proximal to the anastomosis is gently occluded.  The pelvis was filled with sterile saline.  I then passed the flexible sigmoidoscope under direct visualization and anastomosis is located at 12 cm.  With the anastomosis well under water there is no air bubbles.  The anastomosis is intact with a hemostatic staple line.  The Bookwalter retractor was then removed by my assistant.  All equipment was passed off.  Gown/gloves were changed by all participants.  I had scrubbed back in.  The field was then redraped with additional sterile drapes.  Clean equipment instruments utilized.  The small bowel was then run.  We identified no apparent injuries.  The colon was inspected and also intact without any apparent injuries either. The stomach is inspected and normal in appearance without injury.  Attention was directed to closing the former colostomy site first.  Given habitus and her anatomy, we opted to close this internally.  With the fascia well exposed, we opted to close this longitudinally this resulted in the least amount of tension.  This was done using 2 running #1 PDS sutures.  The defect/closure was palpated and noted to be completely closed.  We then closed the supraumbilical cutdown site by using 2 running #1 PDS sutures.  This was palpated noted to be completely closed.  The midline fascia is then closed using 2 running #1 looped PDS sutures tied centrally.  This closure was palpated noted to be completely closed.  Local anesthetic is infiltrated circumferentially at all wounds working maintaining a plane between the internal oblique and transversus abdominis.  All sponge, needle, and instrument counts were reported correct x2.  The skin of both midline incisions were closed using staples.  Dressing consisting of 4 x 4's,  ABD was placed.  The former ostomy site is closed partially using a pursestring suture of 0 Vicryl.  This wound was then gently packed with a moist Kerlix.  Additional 4 x 4's were applied.  All dressings are secured with tape.   She was then taken out of lithotomy, awakened from anesthesia, extubated, and transferred to a stretcher for transport to PACU in satisfactory condition having tolerated the procedure well.

## 2022-01-30 ENCOUNTER — Other Ambulatory Visit (HOSPITAL_COMMUNITY): Payer: Self-pay

## 2022-01-30 ENCOUNTER — Encounter (HOSPITAL_COMMUNITY): Payer: Self-pay | Admitting: Surgery

## 2022-01-30 LAB — BASIC METABOLIC PANEL
Anion gap: 7 (ref 5–15)
BUN: 17 mg/dL (ref 8–23)
CO2: 24 mmol/L (ref 22–32)
Calcium: 8.8 mg/dL — ABNORMAL LOW (ref 8.9–10.3)
Chloride: 104 mmol/L (ref 98–111)
Creatinine, Ser: 1.19 mg/dL — ABNORMAL HIGH (ref 0.44–1.00)
GFR, Estimated: 49 mL/min — ABNORMAL LOW (ref >60)
Glucose, Bld: 138 mg/dL — ABNORMAL HIGH (ref 70–99)
Potassium: 5.2 mmol/L — ABNORMAL HIGH (ref 3.5–5.1)
Sodium: 135 mmol/L (ref 135–145)

## 2022-01-30 LAB — CBC
HCT: 41.5 % (ref 36.0–46.0)
Hemoglobin: 13.5 g/dL (ref 12.0–15.0)
MCH: 31.3 pg (ref 26.0–34.0)
MCHC: 32.5 g/dL (ref 30.0–36.0)
MCV: 96.3 fL (ref 80.0–100.0)
Platelets: 225 10*3/uL (ref 150–400)
RBC: 4.31 MIL/uL (ref 3.87–5.11)
RDW: 14.1 % (ref 11.5–15.5)
WBC: 16 10*3/uL — ABNORMAL HIGH (ref 4.0–10.5)
nRBC: 0 % (ref 0.0–0.2)

## 2022-01-30 MED ORDER — OXYCODONE HCL 5 MG PO TABS
5.0000 mg | ORAL_TABLET | Freq: Four times a day (QID) | ORAL | 0 refills | Status: DC | PRN
  Filled 2022-01-31: qty 20, 5d supply, fill #0

## 2022-01-30 NOTE — Discharge Instructions (Signed)
POST OP INSTRUCTIONS AFTER COLON SURGERY  DIET: Be sure to include lots of fluids daily to stay hydrated - 64oz of water per day (8, 8 oz glasses).  Avoid fast food or heavy meals for the first couple of weeks as your are more likely to get nauseated. Avoid raw/uncooked fruits or vegetables for the first 4 weeks (its ok to have these if they are blended into smoothie form). If you have fruits/vegetables, make sure they are cooked until soft enough to mash on the roof of your mouth and chew your food well. Otherwise, diet as tolerated.  Take your usually prescribed home medications unless otherwise directed.  PAIN CONTROL: Pain is best controlled by a usual combination of three different methods TOGETHER: Ice/Heat Over the counter pain medication Prescription pain medication Most patients will experience some swelling and bruising around the surgical site.  Ice packs or heating pads (30-60 minutes up to 6 times a day) will help. Some people prefer to use ice alone, heat alone, alternating between ice & heat.  Experiment to what works for you.  Swelling and bruising can take several weeks to resolve.   It is helpful to take an over-the-counter pain medication regularly for the first few weeks: Ibuprofen (Motrin/Advil) - '200mg'$  tabs - take 3 tabs ('600mg'$ ) every 6 hours as needed for pain (unless you have been directed previously to avoid NSAIDs/ibuprofen) Acetaminophen (Tylenol) - you may take '650mg'$  every 6 hours as needed. You can take this with motrin as they act differently on the body. If you are taking a narcotic pain medication that has acetaminophen in it, do not take over the counter tylenol at the same time. NOTE: You may take both of these medications together - most patients  find it most helpful when alternating between the two (i.e. Ibuprofen at 6am, tylenol at 9am, ibuprofen at 12pm ..Marland Kitchen) A  prescription for pain medication should be given to you upon discharge.  Take your pain medication as  prescribed if your pain is not adequatly controlled with the over-the-counter pain reliefs mentioned above.  Avoid getting constipated.  Between the surgery and the pain medications, it is common to experience some constipation.  Increasing fluid intake and taking a fiber supplement (such as Metamucil, Citrucel, FiberCon, MiraLax, etc) 1-2 times a day regularly will usually help prevent this problem from occurring.  A mild laxative (prune juice, Milk of Magnesia, MiraLax, etc) should be taken according to package directions if there are no bowel movements after 48 hours.    Dressing: Your incisions have staples in place - please call the office on arrival home to confirm staple removal appointment - ~2 weeks out from surgery these are generally removed. Your former colostomy site may be covered with dry gauze and changed daily. Ok to bathe normally in a shower and get soap/water over your wounds.   ACTIVITIES as tolerated:   Avoid heavy lifting (>10lbs or 1 gallon of milk) for the next 6 weeks. You may resume regular daily activities as tolerated--such as daily self-care, walking, climbing stairs--gradually increasing activities as tolerated.  If you can walk 30 minutes without difficulty, it is safe to try more intense activity such as jogging, treadmill, bicycling, low-impact aerobics.  DO NOT PUSH THROUGH PAIN.  Let pain be your guide: If it hurts to do something, don't do it. You may drive when you are no longer taking prescription pain medication, you can comfortably wear a seatbelt, and you can safely maneuver your car and apply  brakes.  FOLLOW UP in our office Please call [facility] at [phone] to set up an appointment to see your surgeon in the office for a follow-up appointment approximately 2 weeks after your surgery. Make sure that you call for this appointment the day you arrive home to insure a convenient appointment time.  9. If you have disability or family leave forms that need to be  completed, you may have them completed by your primary care physician's office; for return to work instructions, please ask our office staff and they will be happy to assist you in obtaining this documentation   When to call us [phone]: Poor pain control Reactions / problems with new medications (rash/itching, etc)  Fever over 101.5 F (38.5 C) Inability to urinate Nausea/vomiting Worsening swelling or bruising Continued bleeding from incision. Increased pain, redness, or drainage from the incision  The clinic staff is available to answer your questions during regular business hours (8:30am-5pm).  Please don't hesitate to call and ask to speak to one of our nurses for clinical concerns.   A surgeon from [facility] is always on call at the hospitals   If you have a medical emergency, go to the nearest emergency room or call 911.  [facility], [place] [address], [place], [place], [place]  [address] MAIN: [phone] FAX: [phone] [email or web address]

## 2022-01-30 NOTE — Progress Notes (Signed)
Subjective No acute events. Feeling reasonably well. Using PCA some. No n/v. Pain well controlled. Passing flatus; no bm yet.  Objective: Vital signs in last 24 hours: Temp:  [97.5 F (36.4 C)-98.2 F (36.8 C)] 98.2 F (36.8 C) (11/02 7425) Pulse Rate:  [66-84] 78 (11/02 0633) Resp:  [14-21] 18 (11/02 0737) BP: (106-158)/(54-78) 122/71 (11/02 0633) SpO2:  [93 %-100 %] 98 % (11/02 0737) FiO2 (%):  [98 %] 98 % (11/02 0737) Weight:  [97.6 kg] 97.6 kg (11/02 0447)    Intake/Output from previous day: 11/01 0701 - 11/02 0700 In: 4472.1 [P.O.:960; I.V.:3412.1; IV Piggyback:100] Out: 2600 [Urine:2350; Blood:250] Intake/Output this shift: No intake/output data recorded.  Gen: NAD, comfortable CV: RRR Pulm: Normal work of breathing Abd: Soft, minimal incisional tenderness; ostomy site unpacked, clean. Midline wounds dressed and dry. No rebound/guarding. Ext: SCDs in place  Lab Results: CBC  Recent Labs    01/29/22 0708 01/30/22 0445  WBC 8.8 16.0*  HGB 15.0 13.5  HCT 45.0 41.5  PLT 325 225   BMET Recent Labs    01/29/22 0708  NA 136  K 3.4*  CL 106  CO2 21*  GLUCOSE 124*  BUN 13  CREATININE 0.99  CALCIUM 9.7   PT/INR No results for input(s): "LABPROT", "INR" in the last 72 hours. ABG No results for input(s): "PHART", "HCO3" in the last 72 hours.  Invalid input(s): "PCO2", "PO2"  Studies/Results:  Anti-infectives: Anti-infectives (From admission, onward)    Start     Dose/Rate Route Frequency Ordered Stop   01/29/22 1400  neomycin (MYCIFRADIN) tablet 1,000 mg  Status:  Discontinued       See Hyperspace for full Linked Orders Report.   1,000 mg Oral 3 times per day 01/29/22 0627 01/29/22 9563   01/29/22 1400  metroNIDAZOLE (FLAGYL) tablet 1,000 mg  Status:  Discontinued       See Hyperspace for full Linked Orders Report.   1,000 mg Oral 3 times per day 01/29/22 8756 01/29/22 4332   01/29/22 0630  cefoTEtan (CEFOTAN) 2 g in sodium chloride 0.9 % 100 mL  IVPB        2 g 200 mL/hr over 30 Minutes Intravenous On call to O.R. 01/29/22 0627 01/29/22 0905        Assessment/Plan: Patient Active Problem List   Diagnosis Date Noted   S/P colostomy takedown 01/29/2022   RUQ pain 06/07/2021   Perforation of cecum 05/24/2021   S/P exploratory laparotomy 05/20/2021   Large bowel obstruction ([place]) 05/20/2021   Stricture of sigmoid colon (HCC)    Colonic obstruction ([place]) 05/17/2021   Primary hypertension 05/17/2021   CAD (coronary artery disease), native coronary artery 05/17/2021   Hypokalemia 05/17/2021   OSA (obstructive sleep apnea) 06/27/2020   Chronic diastolic heart failure ([place]) 10/15/2019   Hyperlipidemia 10/15/2019   GERD (gastroesophageal reflux disease) 10/15/2019   Ischemic cardiomyopathy 10/15/2019   s/p Procedure(s): ROBOTIC COVERTED TO OPEN LOWER ANTERIOR RESECTION, LYSIS OF ADHESIONS WITH TAKEDOWN OF LOOP COLOSTOMY FLEXIBLE SIGMOIDOSCOPY CYSTOSCOPY with FIREFLY INJECTION 01/29/2022  POD#1  Reviewed her procedure, findings and plans with her today, all questions answered  Ambulate 5x/day; PT to see MIVF Clears, advancing as tolerated Cover former ostomy site with dry gauze, no need to pack Midline dressing down 11/4 May restart plavix tomorrow if hgb remains stable, no bright red blood per rectum Ppx: SQH, SCDs   LOS: 1 day   Check [email or web address] for General Surgery coverage night/weekend/holidays  Nadeen Landau, MD [facility],  [place]

## 2022-01-30 NOTE — Evaluation (Signed)
Physical Therapy Evaluation Patient Details Name: Valerie Fields MRN: [id number] DOB: 1950-04-20 Today's Date: 01/30/2022  History of Present Illness  Pt is a 71 year old female with history of  hemicolectomy, colostomy, wound vac placement 05/22/21.  Pt admitted for ROBOTIC COVERTED TO OPEN LOWER ANTERIOR RESECTION, LYSIS OF ADHESIONS WITH TAKEDOWN OF LOOP COLOSTOMY on 01/29/22.  Hx of NSTEMI, CAD, HF, OSA  Clinical Impression  Pt admitted with above diagnosis.  Pt currently with functional limitations due to the deficits listed below (see PT Problem List). Pt will benefit from skilled PT to increase their independence and safety with mobility to allow discharge to the venue listed below.   Pt assisted with ambulating in hallway POD #1 and performing well with RW.  Anticipate pt to progress well and pt agreeable to ambulate with staff.  Pt encouraged to perform ambulation 5x/day per activity orders.  Pt also to obtain her RW back from church if she needs it at home upon d/c.       Recommendations for follow up therapy are one component of a multi-disciplinary discharge planning process, led by the attending physician.  Recommendations may be updated based on patient status, additional functional criteria and insurance authorization.  Follow Up Recommendations No PT follow up      Assistance Recommended at Discharge PRN  Patient can return home with the following       Equipment Recommendations None recommended by PT (pt will obtain RW)  Recommendations for Other Services       Functional Status Assessment Patient has had a recent decline in their functional status and demonstrates the ability to make significant improvements in function in a reasonable and predictable amount of time.     Precautions / Restrictions Precautions Precautions: Fall Precaution Comments: abdominal incision      Mobility  Bed Mobility Overal bed mobility: Needs Assistance Bed Mobility: Rolling,  Sidelying to Sit Rolling: Min guard Sidelying to sit: Min guard       General bed mobility comments: good recall of log roll technique    Transfers Overall transfer level: Needs assistance Equipment used: Rolling walker (2 wheels) Transfers: Sit to/from Stand Sit to Stand: Min guard           General transfer comment: min/guard for safety, increased time and effort due to pain    Ambulation/Gait Ambulation/Gait assistance: Min guard Gait Distance (Feet): 120 Feet Assistive device: Rolling walker (2 wheels) Gait Pattern/deviations: Decreased stride length, Step-through pattern       General Gait Details: slow but steady with RW, distance to tolerance  Stairs            Wheelchair Mobility    Modified Rankin (Stroke Patients Only)       Balance Overall balance assessment: No apparent balance deficits (not formally assessed)                                           Pertinent Vitals/Pain Pain Assessment Pain Assessment: 0-10 Pain Score: 7  Pain Location: abdomen Pain Descriptors / Indicators: Grimacing, Sore Pain Intervention(s): PCA encouraged, Repositioned, Monitored during session    Home Living Family/patient expects to be discharged to:: Private residence Living Arrangements: Alone Available Help at Discharge: Family Type of Home: House Home Access: Stairs to enter   Technical brewer of Steps: 1   Home Layout: One level   Additional Comments:  pt reports she gave her RW to her church however will have daughter obtain in case she does need it upon d/c    Prior Function Prior Level of Function : Independent/Modified Independent             Mobility Comments: recent trip to [place], [place] and [place]       Hand Dominance        Extremity/Trunk Assessment        Lower Extremity Assessment Lower Extremity Assessment: Overall WFL for tasks assessed       Communication   Communication: No difficulties   Cognition Arousal/Alertness: Awake/alert Behavior During Therapy: WFL for tasks assessed/performed Overall Cognitive Status: Within Functional Limits for tasks assessed                                          General Comments      Exercises     Assessment/Plan    PT Assessment Patient needs continued PT services  PT Problem List Decreased knowledge of use of DME;Decreased mobility       PT Treatment Interventions Gait training;DME instruction;Therapeutic exercise;Functional mobility training;Therapeutic activities;Patient/family education;Balance training    PT Goals (Current goals can be found in the Care Plan section)  Acute Rehab PT Goals PT Goal Formulation: With patient Time For Goal Achievement: 02/13/22 Potential to Achieve Goals: Good    Frequency Min 3X/week     Co-evaluation               AM-PAC PT "6 Clicks" Mobility  Outcome Measure Help needed turning from your back to your side while in a flat bed without using bedrails?: A Little Help needed moving from lying on your back to sitting on the side of a flat bed without using bedrails?: A Little Help needed moving to and from a bed to a chair (including a wheelchair)?: A Little Help needed standing up from a chair using your arms (e.g., wheelchair or bedside chair)?: A Little Help needed to walk in hospital room?: A Little Help needed climbing 3-5 steps with a railing? : A Little 6 Click Score: 18    End of Session   Activity Tolerance: Patient tolerated treatment well Patient left: in chair;with call bell/phone within reach Nurse Communication: Mobility status PT Visit Diagnosis: Difficulty in walking, not elsewhere classified (R26.2)    Time: 6160-7371 PT Time Calculation (min) (ACUTE ONLY): 14 min   Charges:   PT Evaluation $PT Eval Low Complexity: 1 Low        Valerie Fields PT, DPT Physical Therapist Acute Rehabilitation Services Preferred contact method: Secure  Chat Weekend Pager Only: [phone] Office: [phone]   Myrtis Hopping [place] 01/30/2022, 4:04 PM

## 2022-01-30 NOTE — TOC Progression Note (Signed)
Transition of Care [facility]) - Progression Note    Patient Details  Name: Valerie Fields MRN: [id number] Date of Birth: [birth year]  Transition of Care [facility]) CM/SW [place], RN Phone [phone]  01/30/2022, 2:57 PM  Clinical Narrative:     Transition of Care [facility]) Screening Note   Patient Details  Name: Valerie Fields Date of Birth: 03-Apr-1950   Transition of Care [facility]) CM/SW Contact:    Angelita Ingles, RN Phone [phone]  01/30/2022, 2:58 PM    Transition of Care Department [facility]) has reviewed patient and no TOC needs have been identified at this time. We will continue to monitor patient advancement through interdisciplinary progression rounds.           Expected Discharge Plan and Services                                                 Social Determinants of Health (SDOH) Interventions    Readmission Risk Interventions     No data to display

## 2022-01-31 ENCOUNTER — Other Ambulatory Visit (HOSPITAL_COMMUNITY): Payer: Self-pay

## 2022-01-31 LAB — BASIC METABOLIC PANEL
Anion gap: 8 (ref 5–15)
BUN: 9 mg/dL (ref 8–23)
CO2: 25 mmol/L (ref 22–32)
Calcium: 8.2 mg/dL — ABNORMAL LOW (ref 8.9–10.3)
Chloride: 99 mmol/L (ref 98–111)
Creatinine, Ser: 0.73 mg/dL (ref 0.44–1.00)
GFR, Estimated: >60 mL/min (ref >60)
Glucose, Bld: 121 mg/dL — ABNORMAL HIGH (ref 70–99)
Potassium: 4.2 mmol/L (ref 3.5–5.1)
Sodium: 132 mmol/L — ABNORMAL LOW (ref 135–145)

## 2022-01-31 LAB — CBC
HCT: 31.8 % — ABNORMAL LOW (ref 36.0–46.0)
Hemoglobin: 10.2 g/dL — ABNORMAL LOW (ref 12.0–15.0)
MCH: 31.6 pg (ref 26.0–34.0)
MCHC: 32.1 g/dL (ref 30.0–36.0)
MCV: 98.5 fL (ref 80.0–100.0)
Platelets: 246 10*3/uL (ref 150–400)
RBC: 3.23 MIL/uL — ABNORMAL LOW (ref 3.87–5.11)
RDW: 14.1 % (ref 11.5–15.5)
WBC: 12.8 10*3/uL — ABNORMAL HIGH (ref 4.0–10.5)
nRBC: 0 % (ref 0.0–0.2)

## 2022-01-31 LAB — SURGICAL PATHOLOGY

## 2022-01-31 MED ORDER — HYDROMORPHONE HCL 1 MG/ML IJ SOLN
0.5000 mg | INTRAMUSCULAR | Status: DC | PRN
  Administered 2022-01-31: 0.5 mg via INTRAVENOUS
  Filled 2022-01-31: qty 0.5

## 2022-01-31 MED ORDER — OXYCODONE HCL 5 MG PO TABS
5.0000 mg | ORAL_TABLET | Freq: Four times a day (QID) | ORAL | Status: DC | PRN
  Administered 2022-01-31 – 2022-02-01 (×3): 5 mg via ORAL
  Filled 2022-01-31 (×3): qty 1

## 2022-01-31 NOTE — Progress Notes (Signed)
Mobility Specialist - Progress Note  01/31/22 1151  Mobility  Activity Ambulated with assistance in hallway  Level of Assistance Modified independent, requires aide device or extra time  Assistive Device  (IV Pole)  Distance Ambulated (ft) 250 ft  Activity Response Tolerated well  Mobility Referral Yes  $Mobility charge 1 Mobility   Pt received in bed and agreeable to mobility. C/o abdomen soreness while ambulating, but no other complaints. Pt to recliner after session with all needs met.    [facility]

## 2022-01-31 NOTE — Progress Notes (Signed)
Physical Therapy Treatment Patient Details Name: Valerie Fields MRN: [id number] DOB: 02-28-1951 Today's Date: 01/31/2022   History of Present Illness Pt is a 71 year old female with history of  hemicolectomy, colostomy, wound vac placement 05/22/21.  Pt admitted for ROBOTIC COVERTED TO OPEN LOWER ANTERIOR RESECTION, LYSIS OF ADHESIONS WITH TAKEDOWN OF LOOP COLOSTOMY on 01/29/22.  Hx of NSTEMI, CAD, HF, OSA    PT Comments    Pt is progressing well with mobility, she ambulated 280' with RW, no loss of balance, SaO2 95% on room air, no dyspnea. PT goals have been met, PT signing off. Mobility specialist will continue to follow pt.     Recommendations for follow up therapy are one component of a multi-disciplinary discharge planning process, led by the attending physician.  Recommendations may be updated based on patient status, additional functional criteria and insurance authorization.  Follow Up Recommendations  No PT follow up     Assistance Recommended at Discharge PRN  Patient can return home with the following Assistance with cooking/housework   Equipment Recommendations  None recommended by PT    Recommendations for Other Services       Precautions / Restrictions Precautions Precautions: Fall Precaution Comments: abdominal incision, reviewed log roll Restrictions Weight Bearing Restrictions: No     Mobility  Bed Mobility Overal bed mobility: Modified Independent Bed Mobility: Rolling, Sidelying to Sit Rolling: Modified independent (Device/Increase time) Sidelying to sit: Modified independent (Device/Increase time), HOB elevated       General bed mobility comments: good recall of log roll technique    Transfers Overall transfer level: Modified independent Equipment used: Rolling walker (2 wheels) Transfers: Sit to/from Stand Sit to Stand: Modified independent (Device/Increase time)                Ambulation/Gait Ambulation/Gait assistance: Modified  independent (Device/Increase time) Gait Distance (Feet): 280 Feet Assistive device: Rolling walker (2 wheels) Gait Pattern/deviations: WFL(Within Functional Limits)       General Gait Details: steady, no loss of balance, SaO2 95% on room air walking   Stairs             Wheelchair Mobility    Modified Rankin (Stroke Patients Only)       Balance Overall balance assessment: Modified Independent                                          Cognition Arousal/Alertness: Awake/alert Behavior During Therapy: WFL for tasks assessed/performed Overall Cognitive Status: Within Functional Limits for tasks assessed                                          Exercises      General Comments        Pertinent Vitals/Pain Pain Assessment Pain Score: 4  Pain Location: abdomen Pain Descriptors / Indicators: Sore Pain Intervention(s): Limited activity within patient's tolerance, Monitored during session, Repositioned    Home Living                          Prior Function            PT Goals (current goals can now be found in the care plan section) Acute Rehab PT Goals PT Goal Formulation: All assessment and education  complete, DC therapy Time For Goal Achievement: 02/13/22 Potential to Achieve Goals: Good Progress towards PT goals: Goals met/education completed, patient discharged from PT    Frequency    Min 3X/week      PT Plan Current plan remains appropriate    Co-evaluation              AM-PAC PT "6 Clicks" Mobility   Outcome Measure  Help needed turning from your back to your side while in a flat bed without using bedrails?: None Help needed moving from lying on your back to sitting on the side of a flat bed without using bedrails?: None Help needed moving to and from a bed to a chair (including a wheelchair)?: None Help needed standing up from a chair using your arms (e.g., wheelchair or bedside chair)?:  None Help needed to walk in hospital room?: None Help needed climbing 3-5 steps with a railing? : None 6 Click Score: 24    End of Session   Activity Tolerance: Patient tolerated treatment well Patient left: in bed;with bed alarm set;with call bell/phone within reach Nurse Communication: Mobility status PT Visit Diagnosis: Difficulty in walking, not elsewhere classified (R26.2)     Time: 1400-1420 PT Time Calculation (min) (ACUTE ONLY): 20 min  Charges:  $Gait Training: 8-22 mins                     Blondell Reveal Kistler PT 01/31/2022  Acute Rehabilitation Services  Office [phone]

## 2022-01-31 NOTE — Progress Notes (Signed)
Subjective No acute events. Feeling reasonably well. Using PCA some. No n/v. Pain well controlled. Passing flatus; no bm yet.  Objective: Vital signs in last 24 hours: Temp:  [97.5 F (36.4 C)-98.2 F (36.8 C)] 98.2 F (36.8 C) (11/02 0981) Pulse Rate:  [66-84] 78 (11/02 0633) Resp:  [14-21] 18 (11/02 0737) BP: (106-158)/(54-78) 122/71 (11/02 0633) SpO2:  [93 %-100 %] 98 % (11/02 0737) FiO2 (%):  [98 %] 98 % (11/02 0737) Weight:  [97.6 kg] 97.6 kg (11/02 0447)    Intake/Output from previous day: 11/01 0701 - 11/02 0700 In: 4472.1 [P.O.:960; I.V.:3412.1; IV Piggyback:100] Out: 2600 [Urine:2350; Blood:250] Intake/Output this shift: No intake/output data recorded.  Gen: NAD, comfortable CV: RRR Pulm: Normal work of breathing Abd: Soft, minimal incisional tenderness; ostomy site unpacked, clean. Midline wounds dressed and dry. No rebound/guarding. Ext: SCDs in place  Lab Results: CBC  Recent Labs    01/29/22 0708 01/30/22 0445  WBC 8.8 16.0*  HGB 15.0 13.5  HCT 45.0 41.5  PLT 325 225   BMET Recent Labs    01/29/22 0708  NA 136  K 3.4*  CL 106  CO2 21*  GLUCOSE 124*  BUN 13  CREATININE 0.99  CALCIUM 9.7   PT/INR No results for input(s): "LABPROT", "INR" in the last 72 hours. ABG No results for input(s): "PHART", "HCO3" in the last 72 hours.  Invalid input(s): "PCO2", "PO2"  Studies/Results:  Anti-infectives: Anti-infectives (From admission, onward)    Start     Dose/Rate Route Frequency Ordered Stop   01/29/22 1400  neomycin (MYCIFRADIN) tablet 1,000 mg  Status:  Discontinued       See Hyperspace for full Linked Orders Report.   1,000 mg Oral 3 times per day 01/29/22 0627 01/29/22 1914   01/29/22 1400  metroNIDAZOLE (FLAGYL) tablet 1,000 mg  Status:  Discontinued       See Hyperspace for full Linked Orders Report.   1,000 mg Oral 3 times per day 01/29/22 7829 01/29/22 5621   01/29/22 0630  cefoTEtan (CEFOTAN) 2 g in sodium chloride 0.9 % 100 mL  IVPB        2 g 200 mL/hr over 30 Minutes Intravenous On call to O.R. 01/29/22 0627 01/29/22 0905        Assessment/Plan: Patient Active Problem List   Diagnosis Date Noted   S/P colostomy takedown 01/29/2022   RUQ pain 06/07/2021   Perforation of cecum 05/24/2021   S/P exploratory laparotomy 05/20/2021   Large bowel obstruction ([place]) 05/20/2021   Stricture of sigmoid colon (HCC)    Colonic obstruction ([place]) 05/17/2021   Primary hypertension 05/17/2021   CAD (coronary artery disease), native coronary artery 05/17/2021   Hypokalemia 05/17/2021   OSA (obstructive sleep apnea) 06/27/2020   Chronic diastolic heart failure ([place]) 10/15/2019   Hyperlipidemia 10/15/2019   GERD (gastroesophageal reflux disease) 10/15/2019   Ischemic cardiomyopathy 10/15/2019   s/p Procedure(s): ROBOTIC COVERTED TO OPEN LOWER ANTERIOR RESECTION, LYSIS OF ADHESIONS WITH TAKEDOWN OF LOOP COLOSTOMY FLEXIBLE SIGMOIDOSCOPY CYSTOSCOPY with FIREFLY INJECTION 01/29/2022  POD#2  D/C PCA Ambulate 5x/day; PT gave walker but no outpt needs identified/PT follow-up MIVF decreased to 50cc/hr today Full liquids, ADAT Cover former ostomy site with dry gauze, no need to pack Midline dressing down 11/4 May restart plavix 11/4 if hgb remains stable, no bright red blood per rectum Ppx: SQH, SCDs   LOS: 1 day   Check [email or web address] for General Surgery coverage night/weekend/holidays  Nadeen Landau, MD [facility], A [facility]

## 2022-02-01 LAB — BASIC METABOLIC PANEL
Anion gap: 6 (ref 5–15)
BUN: 11 mg/dL (ref 8–23)
CO2: 27 mmol/L (ref 22–32)
Calcium: 8.6 mg/dL — ABNORMAL LOW (ref 8.9–10.3)
Chloride: 101 mmol/L (ref 98–111)
Creatinine, Ser: 0.77 mg/dL (ref 0.44–1.00)
GFR, Estimated: >60 mL/min (ref >60)
Glucose, Bld: 118 mg/dL — ABNORMAL HIGH (ref 70–99)
Potassium: 3.9 mmol/L (ref 3.5–5.1)
Sodium: 134 mmol/L — ABNORMAL LOW (ref 135–145)

## 2022-02-01 LAB — CBC
HCT: 32.1 % — ABNORMAL LOW (ref 36.0–46.0)
Hemoglobin: 10.5 g/dL — ABNORMAL LOW (ref 12.0–15.0)
MCH: 31.6 pg (ref 26.0–34.0)
MCHC: 32.7 g/dL (ref 30.0–36.0)
MCV: 96.7 fL (ref 80.0–100.0)
Platelets: 286 10*3/uL (ref 150–400)
RBC: 3.32 MIL/uL — ABNORMAL LOW (ref 3.87–5.11)
RDW: 14.3 % (ref 11.5–15.5)
WBC: 13.3 10*3/uL — ABNORMAL HIGH (ref 4.0–10.5)
nRBC: 0.2 % (ref 0.0–0.2)

## 2022-02-01 MED ORDER — CLOPIDOGREL BISULFATE 75 MG PO TABS
75.0000 mg | ORAL_TABLET | Freq: Every day | ORAL | Status: DC
  Administered 2022-02-01 – 2022-02-02 (×2): 75 mg via ORAL
  Filled 2022-02-01 (×2): qty 1

## 2022-02-01 MED ORDER — SODIUM CHLORIDE 0.9% FLUSH
3.0000 mL | Freq: Two times a day (BID) | INTRAVENOUS | Status: DC
  Administered 2022-02-01 – 2022-02-02 (×3): 3 mL via INTRAVENOUS

## 2022-02-01 NOTE — Progress Notes (Signed)
Patient is up in chair, while using incentive spirometer. Encouraged her to use it 10 times every hour, while awake. She is able to get it up to 7106m.

## 2022-02-01 NOTE — Progress Notes (Signed)
Subjective No acute events. Feeling reasonably well. Pain controlled with Tylenol. No n/v.  Passing flatus and having BMs.  Objective: Vital signs in last 24 hours: Temp:  [98.4 F (36.9 C)-98.7 F (37.1 C)] 98.5 F (36.9 C) (11/04 0511) Pulse Rate:  [80-89] 82 (11/04 0511) Resp:  [16-18] 18 (11/04 0511) BP: (110-115)/(53-60) 112/60 (11/04 0511) SpO2:  [91 %-97 %] 91 % (11/04 0511) Weight:  [95.5 kg] 95.5 kg (11/04 0500) Last BM Date : 01/31/22  Intake/Output from previous day: 11/03 0701 - 11/04 0700 In: 1272.5 [P.O.:300; I.V.:972.5] Out: 200 [Urine:200] Intake/Output this shift: No intake/output data recorded.  Gen: NAD, comfortable CV: RRR Pulm: Normal work of breathing Abd: Soft, minimal incisional tenderness; ostomy site unpacked, clean. Midline wounds dressed and dry. No rebound/guarding. Ext: SCDs in place  Lab Results: CBC  Recent Labs    01/31/22 0456 02/01/22 0524  WBC 12.8* 13.3*  HGB 10.2* 10.5*  HCT 31.8* 32.1*  PLT 246 286    BMET Recent Labs    01/31/22 0456 02/01/22 0524  NA 132* 134*  K 4.2 3.9  CL 99 101  CO2 25 27  GLUCOSE 121* 118*  BUN 9 11  CREATININE 0.73 0.77  CALCIUM 8.2* 8.6*    PT/INR No results for input(s): "LABPROT", "INR" in the last 72 hours. ABG No results for input(s): "PHART", "HCO3" in the last 72 hours.  Invalid input(s): "PCO2", "PO2"  Studies/Results:  Anti-infectives: Anti-infectives (From admission, onward)    Start     Dose/Rate Route Frequency Ordered Stop   01/29/22 1400  neomycin (MYCIFRADIN) tablet 1,000 mg  Status:  Discontinued       See Hyperspace for full Linked Orders Report.   1,000 mg Oral 3 times per day 01/29/22 0627 01/29/22 3086   01/29/22 1400  metroNIDAZOLE (FLAGYL) tablet 1,000 mg  Status:  Discontinued       See Hyperspace for full Linked Orders Report.   1,000 mg Oral 3 times per day 01/29/22 5784 01/29/22 6962   01/29/22 0630  cefoTEtan (CEFOTAN) 2 g in sodium chloride 0.9 % 100  mL IVPB        2 g 200 mL/hr over 30 Minutes Intravenous On call to O.R. 01/29/22 0627 01/29/22 0905        Assessment/Plan: Patient Active Problem List   Diagnosis Date Noted   S/P colostomy takedown 01/29/2022   RUQ pain 06/07/2021   Perforation of cecum 05/24/2021   S/P exploratory laparotomy 05/20/2021   Large bowel obstruction ([place]) 05/20/2021   Stricture of sigmoid colon (HCC)    Colonic obstruction ([place]) 05/17/2021   Primary hypertension 05/17/2021   CAD (coronary artery disease), native coronary artery 05/17/2021   Hypokalemia 05/17/2021   OSA (obstructive sleep apnea) 06/27/2020   Chronic diastolic heart failure ([place]) 10/15/2019   Hyperlipidemia 10/15/2019   GERD (gastroesophageal reflux disease) 10/15/2019   Ischemic cardiomyopathy 10/15/2019   s/p Procedure(s): ROBOTIC COVERTED TO OPEN LOWER ANTERIOR RESECTION, LYSIS OF ADHESIONS WITH TAKEDOWN OF LOOP COLOSTOMY FLEXIBLE SIGMOIDOSCOPY CYSTOSCOPY with FIREFLY INJECTION 01/29/2022  POD#3  Ambulate 5x/day; PT gave walker but no outpt needs identified/PT follow-up MIVF decreased to 50cc/hr today Reg diet Cover former ostomy site with dry gauze, no need to pack Midline dressing down 11/4 restart plavix 11/4, hgb stable, no bright red blood per rectum Ppx: SQH, SCDs   LOS: 3 days   Check [email or web address] for General Surgery coverage night/weekend/holidays Rosario Adie, MD  [facility] [place] [facility]

## 2022-02-01 NOTE — Plan of Care (Signed)

## 2022-02-02 LAB — CBC
HCT: 31.4 % — ABNORMAL LOW (ref 36.0–46.0)
Hemoglobin: 10.3 g/dL — ABNORMAL LOW (ref 12.0–15.0)
MCH: 31.8 pg (ref 26.0–34.0)
MCHC: 32.8 g/dL (ref 30.0–36.0)
MCV: 96.9 fL (ref 80.0–100.0)
Platelets: 334 10*3/uL (ref 150–400)
RBC: 3.24 MIL/uL — ABNORMAL LOW (ref 3.87–5.11)
RDW: 14.2 % (ref 11.5–15.5)
WBC: 12 10*3/uL — ABNORMAL HIGH (ref 4.0–10.5)
nRBC: 0.4 % — ABNORMAL HIGH (ref 0.0–0.2)

## 2022-02-02 MED ORDER — OXYCODONE HCL 5 MG PO TABS: 5.0000 mg | ORAL_TABLET | Freq: Four times a day (QID) | ORAL | 0 refills | Status: AC | PRN

## 2022-02-02 NOTE — Progress Notes (Signed)
4 Days Post-Op   Subjective/Chief Complaint: Tol diet, pain controlled, having flatus and stool, ambulating fine   Objective: Vital signs in last 24 hours: Temp:  [98.2 F (36.8 C)-98.7 F (37.1 C)] 98.7 F (37.1 C) (11/05 0503) Pulse Rate:  [72-78] 78 (11/05 0503) Resp:  [16-18] 16 (11/05 0503) BP: (114-138)/(57-75) 138/75 (11/05 0503) SpO2:  [94 %-95 %] 95 % (11/05 0503) Weight:  [98.5 kg] 98.5 kg (11/05 0503) Last BM Date : 02/01/22  Intake/Output from previous day: 11/04 0701 - 11/05 0700 In: 650 [P.O.:650] Out: -  Intake/Output this shift: No intake/output data recorded.  General nad Pulm effort normal Cv regular Ab soft approp tender incisions without infection  Lab Results:  Recent Labs    02/01/22 0524 02/02/22 0409  WBC 13.3* 12.0*  HGB 10.5* 10.3*  HCT 32.1* 31.4*  PLT 286 334   BMET Recent Labs    01/31/22 0456 02/01/22 0524  NA 132* 134*  K 4.2 3.9  CL 99 101  CO2 25 27  GLUCOSE 121* 118*  BUN 9 11  CREATININE 0.73 0.77  CALCIUM 8.2* 8.6*   PT/INR No results for input(s): "LABPROT", "INR" in the last 72 hours. ABG No results for input(s): "PHART", "HCO3" in the last 72 hours.  Invalid input(s): "PCO2", "PO2"  Studies/Results: No results found.  Anti-infectives: Anti-infectives (From admission, onward)    Start     Dose/Rate Route Frequency Ordered Stop   01/29/22 1400  neomycin (MYCIFRADIN) tablet 1,000 mg  Status:  Discontinued       See Hyperspace for full Linked Orders Report.   1,000 mg Oral 3 times per day 01/29/22 0627 01/29/22 0940   01/29/22 1400  metroNIDAZOLE (FLAGYL) tablet 1,000 mg  Status:  Discontinued       See Hyperspace for full Linked Orders Report.   1,000 mg Oral 3 times per day 01/29/22 0627 01/29/22 0632   01/29/22 0630  cefoTEtan (CEFOTAN) 2 g in sodium chloride 0.9 % 100 mL IVPB        2 g 200 mL/hr over 30 Minutes Intravenous On call to O.R. 01/29/22 0627 01/29/22 0905       Assessment/Plan: POD 4  LAR -dc home today   Rolm Bookbinder 02/02/2022

## 2022-02-02 NOTE — Progress Notes (Signed)
Reviewed written instructions w pt and all questions answered. Instructed/demonstrated how to do dsg change over old ostomy site and pt supplied w a wk supply of items. She verbalized understanding. D/C via w/c w all belongings in stable condition.

## 2022-02-04 ENCOUNTER — Other Ambulatory Visit (HOSPITAL_COMMUNITY): Payer: Self-pay

## 2022-02-04 NOTE — Discharge Summary (Addendum)
Patient ID: Valerie Fields MRN: [id number] DOB/AGE: 07-21-1950 71 y.o.  Admit date: 01/29/2022 Discharge date: 02/02/2022  Discharge Diagnoses Patient Active Problem List   Diagnosis Date Noted   S/P colostomy takedown 01/29/2022   RUQ pain 06/07/2021   Perforation of cecum 05/24/2021   S/P exploratory laparotomy 05/20/2021   Large bowel obstruction ([place]) 05/20/2021   Stricture of sigmoid colon ([place])    Colonic obstruction ([place]) 05/17/2021   Primary hypertension 05/17/2021   CAD (coronary artery disease), native coronary artery 05/17/2021   Hypokalemia 05/17/2021   OSA (obstructive sleep apnea) 06/27/2020   Chronic diastolic heart failure ([place]) 10/15/2019   Hyperlipidemia 10/15/2019   GERD (gastroesophageal reflux disease) 10/15/2019   Ischemic cardiomyopathy 10/15/2019    Consultants None  Procedures PROCEDURE - OR 01/29/22:  Robotic converted to open low anterior resection Takedown of loop colostomy Lysis of adhesions x 120 minutes Diagnostic flexible sigmoidoscopy - necessary to confirm pelvic anatomy and identify rectum Bilateral transversus abdominus plane (TAP) blocks   Hospital Course: She was admitted postop where she recovered uneventfully. Her diet was sequentially advanced and she began having spontaneous bowel function. Mobilized with PT and cleared. Her pain was well controlled. Some BRBPR that was self-limited. On the weekend of 01/31/22, she was seen by my partners and deemed stable for discharge home. Follow-up in my office has been arranged.    Allergies as of 02/02/2022       Reactions   Sulfa Antibiotics Other (See Comments)   As a child, turned red above neck        Medication List     TAKE these medications    acetaminophen 650 MG CR tablet Commonly known as: TYLENOL Take 650 mg by mouth every 6 (six) hours as needed for pain (only takes with oxycodone).   ascorbic acid 500 MG tablet Commonly known as: VITAMIN C Take 500 mg by mouth  daily.   aspirin EC 81 MG tablet Take 81 mg by mouth daily. Swallow whole.   atorvastatin 80 MG tablet Commonly known as: LIPITOR TAKE ONE TABLET BY MOUTH EVERYDAY AT BEDTIME   azelastine 0.1 % nasal spray Commonly known as: ASTELIN Place 1 spray into both nostrils daily. Use in each nostril as directed   b complex vitamins capsule Take 1 capsule by mouth daily.   cetirizine 10 MG tablet Commonly known as: ZYRTEC Take 10 mg by mouth daily.   clopidogrel 75 MG tablet Commonly known as: PLAVIX TAKE ONE TABLET BY MOUTH ONCE DAILY   HAIR SKIN & NAILS GUMMIES PO Take 1 capsule by mouth daily.   losartan 25 MG tablet Commonly known as: COZAAR TAKE ONE TABLET BY MOUTH EVERY MORNING   metoprolol succinate 25 MG 24 hr tablet Commonly known as: TOPROL-XL TAKE ONE TABLET BY MOUTH EVERY MORNING   montelukast 10 MG tablet Commonly known as: SINGULAIR Take 10 mg by mouth at bedtime.   multivitamin with minerals Tabs tablet Take 1 tablet by mouth daily.   nitroGLYCERIN 0.4 MG SL tablet Commonly known as: NITROSTAT Place 1 tablet (0.4 mg total) under the tongue every 5 (five) minutes as needed for chest pain.   oxyCODONE 5 MG immediate release tablet Commonly known as: Roxicodone Take 1 tablet (5 mg total) by mouth every 6 (six) hours as needed for up to 7 days (postop pain not controlled with tylenol first).   silver sulfADIAZINE 1 % cream Commonly known as: SILVADENE Apply 1 Application topically 2 (two) times daily as needed (rash).  zinc gluconate 50 MG tablet Take 50 mg by mouth daily.          Follow-up Information     Ileana Roup, MD Follow up.   Specialties: General Surgery, Colon and Rectal Surgery Contact information: [place] [id number] [place] Dema Severin, M.D. [facility]

## 2022-02-26 ENCOUNTER — Ambulatory Visit: Payer: Medicare HMO | Attending: Cardiovascular Disease | Admitting: Cardiovascular Disease

## 2022-02-26 ENCOUNTER — Encounter: Payer: Self-pay | Admitting: Cardiovascular Disease

## 2022-02-26 VITALS — BP 122/64 | HR 69 | Ht 66.0 in | Wt 200.6 lb

## 2022-02-26 DIAGNOSIS — E782 Mixed hyperlipidemia: Secondary | ICD-10-CM

## 2022-02-26 DIAGNOSIS — I255 Ischemic cardiomyopathy: Secondary | ICD-10-CM

## 2022-02-26 DIAGNOSIS — I5032 Chronic diastolic (congestive) heart failure: Secondary | ICD-10-CM

## 2022-02-26 DIAGNOSIS — I2510 Atherosclerotic heart disease of native coronary artery without angina pectoris: Secondary | ICD-10-CM

## 2022-02-26 NOTE — Progress Notes (Signed)
Chief Complaint  Patient presents with   Follow-up    CAD   History of Present Illness: 71 yo female with history of CAD, ischemic cardiomyopathy, asthma, sleep apnea and GERD who is here today for cardiac follow up. She was admitted to [facility] July 2021 with a NSTEMI. Cardiac cath with severe mid LAD stenosis involving a Diagonal. The LAD was treated with a stent and the Diagonal was treated with balloon angioplasty. Echo post MI with LVEF=40-45%. Most recent echo March 2023 with XLKG=40%, grade 2 diastolic dysfunction, no significant valve disease. She has been treated with Protonix for GERD. She is now on CPAP for sleep apnea. She had a recent perforated bowel and had colon resection in February 2023 and then had colostomy reversal 01/29/22.   She is here today for follow up. The patient denies any chest pain, dyspnea, palpitations, lower extremity edema, orthopnea, PND, dizziness, near syncope or syncope.   Primary Care Physician: Marda Stalker, PA-C  Past Medical History:  Diagnosis Date   Allergy    Arthritis    Asthma    Chronic combined systolic and diastolic CHF 01/25/2535   Ischemic CM // Echocardiogram 7/21: EF 40-45, Gr 2 DD, mid to apical ant/ant-sept AK, RVSP 23.4, trivial MR   Coronary artery disease    NSTEMI 7/21: oLAD 30, pLAD 99 >> PCI: DES; D1 90 >> PCI: POBA, prox and mid RCA 15, EF 35   Diverticulitis    Gallbladder problem    GERD (gastroesophageal reflux disease)    History of heart attack    Hyperlipidemia 10/15/2019   Zetia DC'd during admit for NSTEMI 7/21 >> ? to Atorvastatin   IBS (irritable colon syndrome)    Ischemic cardiomyopathy 10/15/2019   Joint pain    Lactose intolerance    Multiple food allergies    Non-ST elevation (NSTEMI) MI 10/13/2019   PCI:  DES to LAD and POBA to D1    Past Surgical History:  Procedure Laterality Date   BREAST BIOPSY Left    No Scar seen    BREAST CYST EXCISION Left    No scar seen    CARDIAC CATHETERIZATION      COLONOSCOPY     CORONARY STENT INTERVENTION  10/13/2019   CORONARY STENT INTERVENTION N/A 10/13/2019   Procedure: CORONARY STENT INTERVENTION;  Surgeon: Nelva Bush, MD;  Location: [place] [facility];  Service: Cardiovascular;  Laterality: N/A;  mid [place] N/A 05/19/2021   Procedure: FLEXIBLE SIGMOIDOSCOPY;  Surgeon: Irene Shipper, MD;  Location: [facility];  Service: Endoscopy;  Laterality: N/A;   FLEXIBLE SIGMOIDOSCOPY N/A 01/29/2022   Procedure: FLEXIBLE SIGMOIDOSCOPY;  Surgeon: Ileana Roup, MD;  Location: [facility];  Service: General;  Laterality: N/A;   LAPAROTOMY N/A 05/20/2021   Procedure: EXPLORATORY LAPAROTOMY WITH RIGHT HEMI  COLECTOMY; CREATION OF LOOP SIGMOID COLOSTOMY;  Surgeon: Clovis Riley, MD;  Location: [facility];  Service: General;  Laterality: N/A;   LEFT HEART CATH AND CORONARY ANGIOGRAPHY N/A 10/13/2019   Procedure: LEFT HEART CATH AND CORONARY ANGIOGRAPHY;  Surgeon: Nelva Bush, MD;  Location: [place] [facility];  Service: Cardiovascular;  Laterality: N/A;   TONSILLECTOMY     XI ROBOTIC ASSISTED LOWER ANTERIOR RESECTION N/A 01/29/2022   Procedure: ROBOTIC COVERTED TO OPEN LOWER ANTERIOR RESECTION, LYSIS OF ADHESIONS WITH TAKEDOWN OF LOOP COLOSTOMY;  Surgeon: Ileana Roup, MD;  Location: [facility];  Service: General;  Laterality: N/A;    Current Outpatient Medications  Medication Sig Dispense Refill   acetaminophen (TYLENOL) 650 MG CR tablet Take 650 mg by mouth every 6 (six) hours as needed for pain (only takes with oxycodone).     ascorbic acid (VITAMIN C) 500 MG tablet Take 500 mg by mouth daily.     aspirin EC 81 MG tablet Take 81 mg by mouth daily. Swallow whole.     atorvastatin (LIPITOR) 80 MG tablet TAKE ONE TABLET BY MOUTH EVERYDAY AT BEDTIME 90 tablet 3   azelastine (ASTELIN) 0.1 % nasal spray Place 1 spray into both nostrils daily. Use in each nostril as directed     b complex vitamins capsule Take 1 capsule by mouth  daily.     Biotin w/ Vitamins C & E (HAIR SKIN & NAILS GUMMIES PO) Take 1 capsule by mouth daily.     cetirizine (ZYRTEC) 10 MG tablet Take 10 mg by mouth daily.     clopidogrel (PLAVIX) 75 MG tablet TAKE ONE TABLET BY MOUTH ONCE DAILY 90 tablet 2   losartan (COZAAR) 25 MG tablet TAKE ONE TABLET BY MOUTH EVERY MORNING 90 tablet 3   metoprolol succinate (TOPROL-XL) 25 MG 24 hr tablet TAKE ONE TABLET BY MOUTH EVERY MORNING 90 tablet 3   montelukast (SINGULAIR) 10 MG tablet Take 10 mg by mouth at bedtime.  3   Multiple Vitamin (MULTIVITAMIN WITH MINERALS) TABS tablet Take 1 tablet by mouth as needed.     silver sulfADIAZINE (SILVADENE) 1 % cream Apply 1 Application topically 2 (two) times daily as needed (rash).     zinc gluconate 50 MG tablet Take 50 mg by mouth as needed.     nitroGLYCERIN (NITROSTAT) 0.4 MG SL tablet Place 1 tablet (0.4 mg total) under the tongue every 5 (five) minutes as needed for chest pain. 25 tablet 12   No current facility-administered medications for this visit.    Allergies  Allergen Reactions   Sulfa Antibiotics Other (See Comments)    As a child, turned red above neck    Social History   Socioeconomic History   Marital status: Divorced    Spouse name: Not on file   Number of children: 2   Years of education: 16   Highest education level: Not on file  Occupational History   Occupation: Retired  Tobacco Use   Smoking status: Never   Smokeless tobacco: Never  Vaping Use   Vaping Use: Never used  Substance and Sexual Activity   Alcohol use: Yes    Alcohol/week: 1.0 standard drink of alcohol    Types: 1 Glasses of wine per week   Drug use: No   Sexual activity: Not Currently  Other Topics Concern   Not on file  Social History Narrative   Not on file   Social Determinants of Health   Financial Resource Strain: Not on file  Food Insecurity: Unknown (01/29/2022)   Hunger Vital Sign    Worried About Running Out of Food in the Last Year: Patient  refused    [place] in the Last Year: Patient refused  Transportation Needs: No Transportation Needs (01/29/2022)   PRAPARE - Hydrologist (Medical): No    Lack of Transportation (Non-Medical): No  Physical Activity: Not on file  Stress: Not on file  Social Connections: Not on file  Intimate Partner Violence: Not At Risk (01/29/2022)   Humiliation, Afraid, Rape, and Kick questionnaire    Fear of Current or Ex-Partner: No  Emotionally Abused: No    Physically Abused: No    Sexually Abused: No    Family History  Problem Relation Age of Onset   GER disease Father    Aneurysm Mother    Hyperlipidemia Mother    Obesity Mother    Stomach cancer Maternal Grandfather    Colon cancer Neg Hx    Esophageal cancer Neg Hx    Pancreatic cancer Neg Hx    Rectal cancer Neg Hx    Breast cancer Neg Hx     Review of Systems:  As stated in the HPI and otherwise negative.   BP 122/64   Pulse 69   Ht '5\' 6"'$  (1.676 m)   Wt 200 lb 9.6 oz (91 kg)   SpO2 98%   BMI 32.38 kg/m   Physical Examination: General: Well developed, well nourished, NAD  HEENT: OP clear, mucus membranes moist  SKIN: warm, dry. No rashes. Neuro: No focal deficits  Musculoskeletal: Muscle strength 5/5 all ext  Psychiatric: Mood and affect normal  Neck: No JVD, no carotid bruits, no thyromegaly, no lymphadenopathy.  Lungs:Clear bilaterally, no wheezes, rhonci, crackles Cardiovascular: Regular rate and rhythm. No murmurs, gallops or rubs. Abdomen:Soft. Bowel sounds present. Non-tender.  Extremities: No lower extremity edema. Pulses are 2 + in the bilateral DP/PT.  EKG:  EKG is ordered today. The ekg ordered today demonstrates NSR, Poor R wave progression.   Echo March 2023:  1. Left ventricular ejection fraction, by estimation, is 55%. The left  ventricle has low normal function. Left ventricular endocardial border not  optimally defined to evaluate regional wall motion. Left  ventricular  diastolic parameters are consistent  with Grade II diastolic dysfunction (pseudonormalization).   2. Right ventricular systolic function is normal. The right ventricular  size is normal. There is moderately elevated pulmonary artery systolic  pressure.   3. Moderate pleural effusion in the left lateral region.   4. The mitral valve is grossly normal. Trivial mitral valve  regurgitation. Moderate mitral annular calcification.   5. The aortic valve is tricuspid. Aortic valve regurgitation is not  visualized. No aortic stenosis is present.   6. The inferior vena cava is dilated in size with >50% respiratory  variability, suggesting right atrial pressure of 8 mmHg.   Recent Labs: 06/07/2021: Magnesium 1.9 01/29/2022: ALT 30 02/01/2022: BUN 11; Creatinine, Ser 0.77; Potassium 3.9; Sodium 134 02/02/2022: Hemoglobin 10.3; Platelets 334   Lipid Panel    Component Value Date/Time   CHOL 111 10/10/2020 0949   TRIG 135 10/10/2020 0949   HDL 45 10/10/2020 0949   CHOLHDL 2.5 10/10/2020 0949   CHOLHDL 3.7 10/13/2019 1502   VLDL 32 10/13/2019 1502   LDLCALC 43 10/10/2020 0949     Wt Readings from Last 3 Encounters:  02/26/22 200 lb 9.6 oz (91 kg)  02/02/22 217 lb 2.5 oz (98.5 kg)  01/24/22 208 lb 12.8 oz (94.7 kg)   Assessment and Plan:   1. CAD without angina: She has no chest pain. Will continue Plavix, beta blocker and statin.    2. Ischemic cardiomyopathy: LVEF normal by echo in March 2023. Continue beta blocker and ARB.   3. Chronic diastolic CHF: No volume overload on exam. Weight is stable. She is on not diuretics.   4. Hyperlipidemia: LDL 43 in July 2022. Recent labs. Continue statin.     Labs/ tests ordered today include:   Orders Placed This Encounter  Procedures   Lipid panel   Hepatic function panel  EKG 12-Lead   Disposition:   F/U with me in 12 months  Signed, Lauree Chandler, MD 02/26/2022 3:59 PM    [facility] [address], [place], [place]  [address] Phone: [phone]; Fax: [phone]

## 2022-02-26 NOTE — Patient Instructions (Signed)
Medication Instructions:  No changes *If you need a refill on your cardiac medications before your next appointment, please call your pharmacy*   Lab Work: Please return for fasting lipids/liver function   Testing/Procedures: none   Follow-Up: At [facility], you and your health needs are our priority.  As part of our continuing mission to provide you with exceptional heart care, we have created designated Provider Care Teams.  These Care Teams include your primary Cardiologist (physician) and Advanced Practice Providers (APPs -  Physician Assistants and Nurse Practitioners) who all work together to provide you with the care you need, when you need it.   Your next appointment:   12 month(s)  The format for your next appointment:   In Person  Provider:   Lauree Chandler, MD      Important Information About [place]

## 2022-03-10 ENCOUNTER — Ambulatory Visit: Payer: Medicare HMO | Attending: Cardiovascular Disease

## 2022-03-11 LAB — HEPATIC FUNCTION PANEL
ALT: 24 IU/L (ref 0–32)
AST: 21 IU/L (ref 0–40)
Albumin: 3.8 g/dL — ABNORMAL LOW (ref 3.9–4.9)
Alkaline Phosphatase: 89 IU/L (ref 44–121)
Bilirubin Total: <0.2 mg/dL (ref 0.0–1.2)
Bilirubin, Direct: <0.1 mg/dL (ref 0.00–0.40)
Total Protein: 6.5 g/dL (ref 6.0–8.5)

## 2022-03-11 LAB — LIPID PANEL
Chol/HDL Ratio: 2.6 ratio (ref 0.0–4.4)
Cholesterol, Total: 102 mg/dL (ref 100–199)
HDL: 40 mg/dL (ref >39)
LDL Chol Calc (NIH): 42 mg/dL (ref 0–99)
Triglycerides: 104 mg/dL (ref 0–149)
VLDL Cholesterol Cal: 20 mg/dL (ref 5–40)

## 2022-04-29 ENCOUNTER — Other Ambulatory Visit: Payer: Self-pay | Admitting: Cardiovascular Disease

## 2022-06-02 ENCOUNTER — Other Ambulatory Visit (HOSPITAL_COMMUNITY): Payer: Self-pay | Admitting: Family Medicine

## 2022-06-02 ENCOUNTER — Ambulatory Visit (HOSPITAL_COMMUNITY)
Admission: RE | Admit: 2022-06-02 | Discharge: 2022-06-02 | Disposition: A | Discharge status: Home / Self Care | Payer: Medicare HMO | Source: Ambulatory Visit | Attending: Family Medicine | Admitting: Family Medicine

## 2022-06-02 DIAGNOSIS — R1900 Intra-abdominal and pelvic swelling, mass and lump, unspecified site: Secondary | ICD-10-CM

## 2022-06-10 ENCOUNTER — Other Ambulatory Visit: Payer: Self-pay | Admitting: Surgery

## 2022-06-10 DIAGNOSIS — K432 Incisional hernia without obstruction or gangrene: Secondary | ICD-10-CM

## 2022-06-16 ENCOUNTER — Ambulatory Visit
Admission: RE | Admit: 2022-06-16 | Discharge: 2022-06-16 | Disposition: A | Discharge status: Home / Self Care | Payer: Medicare HMO | Source: Ambulatory Visit | Attending: Surgery | Admitting: Surgery

## 2022-06-16 DIAGNOSIS — K432 Incisional hernia without obstruction or gangrene: Secondary | ICD-10-CM

## 2022-06-16 MED ORDER — IOPAMIDOL (ISOVUE-300) INJECTION 61%
100.0000 mL | Freq: Once | INTRAVENOUS | Status: AC | PRN
  Administered 2022-06-16: 100 mL via INTRAVENOUS

## 2022-07-08 ENCOUNTER — Other Ambulatory Visit: Payer: Self-pay | Admitting: Family Medicine

## 2022-07-08 DIAGNOSIS — Z1231 Encounter for screening mammogram for malignant neoplasm of breast: Secondary | ICD-10-CM

## 2022-08-06 ENCOUNTER — Other Ambulatory Visit: Payer: Self-pay | Admitting: Cardiovascular Disease

## 2022-08-14 ENCOUNTER — Ambulatory Visit
Admission: RE | Admit: 2022-08-14 | Discharge: 2022-08-14 | Disposition: A | Discharge status: Home / Self Care | Payer: Medicare HMO | Source: Ambulatory Visit | Attending: Family Medicine | Admitting: Family Medicine

## 2022-08-14 DIAGNOSIS — Z1231 Encounter for screening mammogram for malignant neoplasm of breast: Secondary | ICD-10-CM

## 2022-08-29 ENCOUNTER — Telehealth: Payer: Self-pay | Admitting: *Deleted

## 2022-08-29 NOTE — Telephone Encounter (Signed)
   Name: Valerie Fields  DOB: July 07, 1950  MRN: [id number]  Primary Cardiologist: Verne Carrow, MD   Preoperative team, please contact this patient and set up a phone call appointment for further preoperative risk assessment. Please obtain consent and complete medication review. Thank you for your help.  I confirm that guidance regarding antiplatelet and oral anticoagulation therapy has been completed and, if necessary, noted below.  Her Plavix may be held for 5 days prior to her procedure.  Her aspirin should be continued throughout her perioperative procedure.  Please resume Plavix as soon as hemostasis is achieved.   Ronney Asters, NP 08/29/2022, 4:29 PM [facility]

## 2022-08-29 NOTE — Telephone Encounter (Signed)
   Pre-operative Risk Assessment    Patient Name: Valerie Fields  DOB: 1951/02/06 MRN: [id number]      Request for Surgical Clearance    Procedure:   Hernia Surgery  Date of Surgery:  Clearance TBD                                 Surgeon:  Dr. Ivar Drape Surgeon's Group or Practice Name:  [facility] [place] Phone number:  [phone] Fax number:  [phone]   Type of Clearance Requested:   - Medical  - Pharmacy:  Hold Aspirin and Clopidogrel (Plavix) Not Indicated.   Type of Anesthesia:  General    Additional requests/questions:    Signed, Emmit Pomfret   08/29/2022, 4:24 PM

## 2022-08-29 NOTE — Telephone Encounter (Signed)
Pt has been scheduled for tele pre op appt 09/10/22 @ 2:40. Med rec and consent are done.

## 2022-08-29 NOTE — Telephone Encounter (Signed)
Pt has been scheduled for tele pre op appt 09/10/22 @ 2:40. Med rec and consent are done.     Patient Consent for Virtual Visit        Valerie Fields has provided verbal consent on 08/29/2022 for a virtual visit (video or telephone).   CONSENT FOR VIRTUAL VISIT FOR:  Valerie Fields  By participating in this virtual visit I agree to the following:  I hereby voluntarily request, consent and authorize [facility] and its employed or contracted physicians, physician assistants, nurse practitioners or other licensed health care professionals (the Practitioner), to provide me with telemedicine health care services (the "Services") as deemed necessary by the treating Practitioner. I acknowledge and consent to receive the Services by the Practitioner via telemedicine. I understand that the telemedicine visit will involve communicating with the Practitioner through live audiovisual communication technology and the disclosure of certain medical information by electronic transmission. I acknowledge that I have been given the opportunity to request an in-person assessment or other available alternative prior to the telemedicine visit and am voluntarily participating in the telemedicine visit.  I understand that I have the right to withhold or withdraw my consent to the use of telemedicine in the course of my care at any time, without affecting my right to future care or treatment, and that the Practitioner or I may terminate the telemedicine visit at any time. I understand that I have the right to inspect all information obtained and/or recorded in the course of the telemedicine visit and may receive copies of available information for a reasonable fee.  I understand that some of the potential risks of receiving the Services via telemedicine include:  Delay or interruption in medical evaluation due to technological equipment failure or disruption; Information transmitted may not be  sufficient (e.g. poor resolution of images) to allow for appropriate medical decision making by the Practitioner; and/or  In rare instances, security protocols could fail, causing a breach of personal health information.  Furthermore, I acknowledge that it is my responsibility to provide information about my medical history, conditions and care that is complete and accurate to the best of my ability. I acknowledge that Practitioner's advice, recommendations, and/or decision may be based on factors not within their control, such as incomplete or inaccurate data provided by me or distortions of diagnostic images or specimens that may result from electronic transmissions. I understand that the practice of medicine is not an exact science and that Practitioner makes no warranties or guarantees regarding treatment outcomes. I acknowledge that a copy of this consent can be made available to me via my patient portal [facility] MyChart), or I can request a printed copy by calling the office of [facility].    I understand that my insurance will be billed for this visit.   I have read or had this consent read to me. I understand the contents of this consent, which adequately explains the benefits and risks of the Services being provided via telemedicine.  I have been provided ample opportunity to ask questions regarding this consent and the Services and have had my questions answered to my satisfaction. I give my informed consent for the services to be provided through the use of telemedicine in my medical care

## 2022-09-10 ENCOUNTER — Ambulatory Visit: Payer: Medicare HMO | Attending: Cardiology | Admitting: Nurse Practitioner

## 2022-09-10 ENCOUNTER — Encounter: Payer: Self-pay | Admitting: Nurse Practitioner

## 2022-09-10 DIAGNOSIS — Z01810 Encounter for preprocedural cardiovascular examination: Secondary | ICD-10-CM | POA: Diagnosis not present

## 2022-09-10 NOTE — Progress Notes (Signed)
Virtual Visit via Telephone Note   Because of Tonea Finton Gottlieb's co-morbid illnesses, she is at least at moderate risk for complications without adequate follow up.  This format is felt to be most appropriate for this patient at this time.  The patient did not have access to video technology/had technical difficulties with video requiring transitioning to audio format only (telephone).  All issues noted in this document were discussed and addressed.  No physical exam could be performed with this format.  Please refer to the patient's chart for her consent to telehealth for [facility].  Evaluation Performed:  Preoperative cardiovascular risk assessment _____________   Date:  09/10/2022   Patient ID:  Sahniya, Henneman 01-18-1951, MRN [id number] Patient Location:  Home Provider location:   Office  Primary Care Provider:  Jarrett Soho, PA-C Primary Cardiologist:  Verne Carrow, MD  Chief Complaint / Patient Profile   72 y.o. y/o female with a h/o CAD s/p NSTEMI 09/2019 with severe mid LAD stenosis involving a diagonal with DES to LAD and diagonal treated with balloon angioplasty echo post MI with LVEF 40 to 45% with improvement on echo March 2023 with LVEF 55%, OSA on CPAP, HLD, obesity, and chronic HFpEF who is pending hernia repair and presents today for telephonic preoperative cardiovascular risk assessment.  History of Present Illness    Yenia Rigano is a 72 y.o. female who presents via audio/video conferencing for a telehealth visit today.  Pt was last seen in cardiology clinic on 02/26/22 by Dr. Clifton James.  At that time Sallyann Prier was doing well.  The patient is now pending procedure as outlined above. Since her last visit, she denies chest pain, shortness of breath, lower extremity edema, fatigue, palpitations, melena, hematuria, hemoptysis, diaphoresis, weakness, presyncope, syncope, orthopnea, and PND. She exercises with water Zumba,  resistance training, and walking for approximately 5 to 6 hours each week with no concerning cardiac symptoms.   Past Medical History    Past Medical History:  Diagnosis Date   Allergy    Arthritis    Asthma    Chronic combined systolic and diastolic CHF 10/15/2019   Ischemic CM // Echocardiogram 7/21: EF 40-45, Gr 2 DD, mid to apical ant/ant-sept AK, RVSP 23.4, trivial MR   Coronary artery disease    NSTEMI 7/21: oLAD 30, pLAD 99 >> PCI: DES; D1 90 >> PCI: POBA, prox and mid RCA 15, EF 35   Diverticulitis    Gallbladder problem    GERD (gastroesophageal reflux disease)    History of heart attack    Hyperlipidemia 10/15/2019   Zetia DC'd during admit for NSTEMI 7/21 >> ? to Atorvastatin   IBS (irritable colon syndrome)    Ischemic cardiomyopathy 10/15/2019   Joint pain    Lactose intolerance    Multiple food allergies    Non-ST elevation (NSTEMI) MI 10/13/2019   PCI:  DES to LAD and POBA to D1   Past Surgical History:  Procedure Laterality Date   BREAST BIOPSY Left    No Scar seen    BREAST CYST EXCISION Left    No scar seen    CARDIAC CATHETERIZATION     COLONOSCOPY     CORONARY STENT INTERVENTION  10/13/2019   CORONARY STENT INTERVENTION N/A 10/13/2019   Procedure: CORONARY STENT INTERVENTION;  Surgeon: Yvonne Kendall, MD;  [facility];  Service: Cardiovascular;  Laterality: N/A;  mid lad   FLEXIBLE SIGMOIDOSCOPY N/A 05/19/2021   Procedure: FLEXIBLE  SIGMOIDOSCOPY;  Surgeon: Hilarie Fredrickson, MD;  Location: [facility];  Service: Endoscopy;  Laterality: N/A;   FLEXIBLE SIGMOIDOSCOPY N/A 01/29/2022   Procedure: FLEXIBLE SIGMOIDOSCOPY;  Surgeon: Andria Meuse, MD;  Location: [facility];  Service: General;  Laterality: N/A;   LAPAROTOMY N/A 05/20/2021   Procedure: EXPLORATORY LAPAROTOMY WITH RIGHT HEMI  COLECTOMY; CREATION OF LOOP SIGMOID COLOSTOMY;  Surgeon: Berna Bue, MD;  Location: [facility];  Service: General;  Laterality: N/A;   LEFT HEART CATH AND  CORONARY ANGIOGRAPHY N/A 10/13/2019   Procedure: LEFT HEART CATH AND CORONARY ANGIOGRAPHY;  Surgeon: Yvonne Kendall, MD;  [facility]: [facility];  Service: Cardiovascular;  Laterality: N/A;   TONSILLECTOMY     XI ROBOTIC ASSISTED LOWER ANTERIOR RESECTION N/A 01/29/2022   Procedure: ROBOTIC COVERTED TO OPEN LOWER ANTERIOR RESECTION, LYSIS OF ADHESIONS WITH TAKEDOWN OF LOOP COLOSTOMY;  Surgeon: Andria Meuse, MD;  Location: [facility];  Service: General;  Laterality: N/A;    Allergies  Allergies  Allergen Reactions   Sulfa Antibiotics Other (See Comments)    As a child, turned red above neck    Home Medications    Prior to Admission medications   Medication Sig Start Date End Date Taking? Authorizing Provider  acetaminophen (TYLENOL) 650 MG CR tablet Take 650 mg by mouth every 6 (six) hours as needed for pain (only takes with oxycodone).    [provider]  ascorbic acid (VITAMIN C) 500 MG tablet Take 500 mg by mouth daily.    [provider]  aspirin EC 81 MG tablet Take 81 mg by mouth daily. Swallow whole. Patient not taking: Reported on 08/29/2022    [provider]  atorvastatin (LIPITOR) 80 MG tablet TAKE ONE TABLET BY MOUTH EVERYDAY AT BEDTIME 11/01/21   Kathleene Hazel, MD  azelastine (ASTELIN) 0.1 % nasal spray Place 1 spray into both nostrils daily. Use in each nostril as directed    [provider]  b complex vitamins capsule Take 1 capsule by mouth daily.    [provider]  Biotin w/ Vitamins C & E (HAIR SKIN & NAILS GUMMIES PO) Take 1 capsule by mouth daily.    [provider]  cetirizine (ZYRTEC) 10 MG tablet Take 10 mg by mouth daily.    [provider]  clopidogrel (PLAVIX) 75 MG tablet TAKE ONE TABLET BY MOUTH ONCE DAILY 04/29/22   Kathleene Hazel, MD  losartan (COZAAR) 25 MG tablet TAKE ONE TABLET BY MOUTH EVERY MORNING 11/01/21   Kathleene Hazel, MD  metoprolol succinate  (TOPROL-XL) 25 MG 24 hr tablet TAKE ONE TABLET BY MOUTH EVERY MORNING 11/01/21   Kathleene Hazel, MD  montelukast (SINGULAIR) 10 MG tablet Take 10 mg by mouth at bedtime. 08/20/15   [provider]  Multiple Vitamin (MULTIVITAMIN WITH MINERALS) TABS tablet Take 1 tablet by mouth as needed.    [provider]  nitroGLYCERIN (NITROSTAT) 0.4 MG SL tablet Place 1 tablet (0.4 mg total) under the tongue every 5 (five) minutes as needed for chest pain. 10/29/20 01/22/22  Kathleene Hazel, MD  silver sulfADIAZINE (SILVADENE) 1 % cream Apply 1 Application topically 2 (two) times daily as needed (rash). 11/26/21   [provider]  zinc gluconate 50 MG tablet Take 50 mg by mouth as needed.    [provider]    Physical Exam    Vital Signs:  Marine Lojek does not have vital signs available for review today.  Given telephonic nature of communication, physical exam is limited. AAOx3. NAD. Normal affect.  Speech and respirations are unlabored.  Accessory Clinical Findings    None  Assessment & Plan    1.  Preoperative Cardiovascular Risk Assessment: According to the Revised Cardiac Risk Index (RCRI), her Perioperative Risk of Major Cardiac Event is (%): 6.6 due to history of ACS with subsequent ICM. Her Functional Capacity in METs is: 7.04 according to the Duke Activity Status Index (DASI). The patient is doing well from a cardiac perspective. Therefore, based on ACC/AHA guidelines, the patient would be at acceptable risk for the planned procedure without further cardiovascular testing.   The patient was advised that if she develops new symptoms prior to surgery to contact our office to arrange for a follow-up visit, and she verbalized understanding.  Her Plavix may be held for 5 days prior to her procedure. Her aspirin should be continued throughout her perioperative procedure. Please resume Plavix as soon as hemostasis is achieved.   A copy of this  note will be routed to requesting surgeon.  Time:   Today, I have spent 10 minutes with the patient with telehealth technology discussing medical history, symptoms, and management plan.    Levi Aland, NP-C  09/10/2022, 2:38 PM [address] Office [phone] Fax [phone]

## 2022-09-22 ENCOUNTER — Ambulatory Visit: Payer: Self-pay | Admitting: Surgery

## 2022-09-22 NOTE — Progress Notes (Signed)
Sent message, via epic in basket, requesting orders in epic from surgeon.  

## 2022-09-25 ENCOUNTER — Encounter: Payer: Self-pay | Admitting: Cardiovascular Disease

## 2022-09-25 NOTE — Progress Notes (Addendum)
COVID Vaccine Completed:  Yes  Date of COVID positive in last 90 days:  No  PCP - Jarrett Soho, PA-C Cardiologist - Earney Hamburg, MD  Cardiac clearance in Epic dated by Eligha Bridegroom, NP-C  Chest x-ray - N/A EKG - 02-26-22 Epic Stress Test - N/A ECHO - 06-07-21 Epic Cardiac Cath - 10-13-19 Epic Pacemaker/ICD device last checked: Spinal Cord Stimulator:  N/A  Bowel Prep - N/A  Sleep Study - Yes, +sleep apnea CPAP - Uses sometimes  Fasting Blood Sugar - N/A Checks Blood Sugar _____ times a day  Last dose of GLP1 agonist-  N/A GLP1 instructions:  N/A   Last dose of SGLT-2 inhibitors-  N/A SGLT-2 instructions: N/A  Blood Thinner Instructions:  Plavix.  Hold x5 days.  Pt is aware and will take last dose 10-11-22 Aspirin Instructions: Last Dose:  Activity level:  Can go up a flight of stairs and perform activities of daily living without stopping and without symptoms of chest pain or shortness of breath.  Anesthesia review: CAD with hx of MI, Ischemic cardiomyopathy, CHF, HTN, OSA  Patient denies shortness of breath, fever, cough and chest pain at PAT appointment  Patient verbalized understanding of instructions that were given to them at the PAT appointment. Patient was also instructed that they will need to review over the PAT instructions again at home before surgery.

## 2022-09-25 NOTE — Patient Instructions (Signed)
SURGICAL WAITING ROOM VISITATION Patients having surgery or a procedure may have no more than 2 support people in the waiting area - these visitors may rotate.    Children under the age of 52 must have an adult with them who is not the patient.  If the patient needs to stay at the hospital during part of their recovery, the visitor guidelines for inpatient rooms apply. Pre-op nurse will coordinate an appropriate time for 1 support person to accompany patient in pre-op.  This support person may not rotate.    Please refer to the [facility] website for the visitor guidelines for Inpatients (after your surgery is over and you are in a regular room).       Your procedure is scheduled on: 10-17-22   Report to [facility] Main Entrance    Report to admitting at 10:45 AM   Call this number if you have problems the morning of surgery [phone]   Do not eat food :After Midnight.   After Midnight you may have the following liquids until 10:00 AM DAY OF SURGERY  Water Non-Citrus Juices (without pulp, NO RED-Apple, White grape, White cranberry) Black Coffee (NO MILK/CREAM OR CREAMERS, sugar ok)  Clear Tea (NO MILK/CREAM OR CREAMERS, sugar ok) regular and decaf                             Plain Jell-O (NO RED)                                           Fruit ices (not with fruit pulp, NO RED)                                     Popsicles (NO RED)                                                               Sports drinks like Gatorade (NO RED)                       If you have questions, please contact your surgeon's office.   FOLLOW B ANY ADDITIONAL PRE OP INSTRUCTIONS YOU RECEIVED FROM YOUR SURGEON'S OFFICE!!!     Oral Hygiene is also important to reduce your risk of infection.                                    Remember - BRUSH YOUR TEETH THE MORNING OF SURGERY WITH YOUR REGULAR TOOTHPASTE   Do NOT smoke after Midnight   Take these medicines the morning of surgery with A  SIP OF WATER:   Zyrtec  Metoprolol  Tylenol if needed  Bring CPAP mask and tubing day of surgery.                              You may not have any metal on your body including hair pins, jewelry, and  body piercing             Do not wear make-up, lotions, powders, perfumes or deodorant  Do not wear nail polish including gel and S&S, artificial/acrylic nails, or any other type of covering on natural nails including finger and toenails. If you have artificial nails, gel coating, etc. that needs to be removed by a nail salon please have this removed prior to surgery or surgery may need to be canceled/ delayed if the surgeon/ anesthesia feels like they are unable to be safely monitored.   Do not shave  48 hours prior to surgery.    Do not bring valuables to the hospital. [facility] IS NOT RESPONSIBLE   FOR VALUABLES.   Contacts, dentures or bridgework may not be worn into surgery.   Bring small overnight bag day of surgery.   DO NOT BRING YOUR HOME MEDICATIONS TO THE HOSPITAL. PHARMACY WILL DISPENSE MEDICATIONS LISTED ON YOUR MEDICATION LIST TO YOU DURING YOUR ADMISSION IN THE HOSPITAL!    Special Instructions: Bring a copy of your healthcare power of attorney and living will documents the day of surgery if you haven't scanned them before.              Please read over the following fact sheets you were given: IF YOU HAVE QUESTIONS ABOUT YOUR PRE-OP INSTRUCTIONS PLEASE CALL [phone] [place]  If you received a COVID test during your pre-op visit  it is requested that you wear a mask when out in public, stay away from anyone that may not be feeling well and notify your surgeon if you develop symptoms. If you test positive for Covid or have been in contact with anyone that has tested positive in the last 10 days please notify you surgeon.  [facility] - Preparing for Surgery Before surgery, you can play an important role.  Because skin is not sterile, your skin needs to be as free of germs  as possible.  You can reduce the number of germs on your skin by washing with CHG (chlorahexidine gluconate) soap before surgery.  CHG is an antiseptic cleaner which kills germs and bonds with the skin to continue killing germs even after washing. Please DO NOT use if you have an allergy to CHG or antibacterial soaps.  If your skin becomes reddened/irritated stop using the CHG and inform your nurse when you arrive at Short Stay. Do not shave (including legs and underarms) for at least 48 hours prior to the first CHG shower.  You may shave your face/neck.  Please follow these instructions carefully:  1.  Shower with CHG Soap the night before surgery and the  morning of surgery.  2.  If you choose to wash your hair, wash your hair first as usual with your normal  shampoo.  3.  After you shampoo, rinse your hair and body thoroughly to remove the shampoo.                             4.  Use CHG as you would any other liquid soap.  You can apply chg directly to the skin and wash.  Gently with a scrungie or clean washcloth.  5.  Apply the CHG Soap to your body ONLY FROM THE NECK DOWN.   Do   not use on face/ open  Wound or open sores. Avoid contact with eyes, ears mouth and   genitals (private parts).                       Wash face,  Genitals (private parts) with your normal soap.             6.  Wash thoroughly, paying special attention to the area where your    surgery  will be performed.  7.  Thoroughly rinse your body with warm water from the neck down.  8.  DO NOT shower/wash with your normal soap after using and rinsing off the CHG Soap.                9.  Pat yourself dry with a clean towel.            10.  Wear clean pajamas.            11.  Place clean sheets on your bed the night of your first shower and do not  sleep with pets. Day of Surgery : Do not apply any lotions/deodorants the morning of surgery.  Please wear clean clothes to the hospital/surgery  center.  FAILURE TO FOLLOW THESE INSTRUCTIONS MAY RESULT IN THE CANCELLATION OF YOUR SURGERY  PATIENT SIGNATURE_________________________________  NURSE SIGNATURE__________________________________  ________________________________________________________________________

## 2022-09-29 ENCOUNTER — Other Ambulatory Visit: Payer: Self-pay

## 2022-09-29 ENCOUNTER — Encounter (HOSPITAL_COMMUNITY)
Admission: RE | Admit: 2022-09-29 | Discharge: 2022-09-29 | Disposition: A | Discharge status: Home / Self Care | Payer: Medicare HMO | Source: Ambulatory Visit | Attending: Surgery | Admitting: Surgery

## 2022-09-29 ENCOUNTER — Encounter (HOSPITAL_COMMUNITY): Payer: Self-pay

## 2022-09-29 VITALS — BP 135/77 | HR 58 | Temp 98.3°F | Resp 20 | Ht 62.0 in | Wt 218.8 lb

## 2022-09-29 DIAGNOSIS — I252 Old myocardial infarction: Secondary | ICD-10-CM | POA: Insufficient documentation

## 2022-09-29 DIAGNOSIS — E785 Hyperlipidemia, unspecified: Secondary | ICD-10-CM | POA: Diagnosis not present

## 2022-09-29 DIAGNOSIS — J45909 Unspecified asthma, uncomplicated: Secondary | ICD-10-CM | POA: Insufficient documentation

## 2022-09-29 DIAGNOSIS — G4733 Obstructive sleep apnea (adult) (pediatric): Secondary | ICD-10-CM | POA: Insufficient documentation

## 2022-09-29 DIAGNOSIS — I255 Ischemic cardiomyopathy: Secondary | ICD-10-CM | POA: Diagnosis not present

## 2022-09-29 DIAGNOSIS — I110 Hypertensive heart disease with heart failure: Secondary | ICD-10-CM | POA: Insufficient documentation

## 2022-09-29 DIAGNOSIS — I10 Essential (primary) hypertension: Secondary | ICD-10-CM

## 2022-09-29 DIAGNOSIS — I5042 Chronic combined systolic (congestive) and diastolic (congestive) heart failure: Secondary | ICD-10-CM | POA: Diagnosis not present

## 2022-09-29 DIAGNOSIS — Z01812 Encounter for preprocedural laboratory examination: Secondary | ICD-10-CM | POA: Insufficient documentation

## 2022-09-29 DIAGNOSIS — I2510 Atherosclerotic heart disease of native coronary artery without angina pectoris: Secondary | ICD-10-CM | POA: Diagnosis not present

## 2022-09-29 DIAGNOSIS — K219 Gastro-esophageal reflux disease without esophagitis: Secondary | ICD-10-CM | POA: Insufficient documentation

## 2022-09-29 DIAGNOSIS — Z01818 Encounter for other preprocedural examination: Secondary | ICD-10-CM

## 2022-09-29 HISTORY — DX: Anemia, unspecified: D64.9

## 2022-09-29 LAB — BASIC METABOLIC PANEL
Anion gap: 8 (ref 5–15)
BUN: 14 mg/dL (ref 8–23)
CO2: 23 mmol/L (ref 22–32)
Calcium: 9.2 mg/dL (ref 8.9–10.3)
Chloride: 109 mmol/L (ref 98–111)
Creatinine, Ser: 0.93 mg/dL (ref 0.44–1.00)
GFR, Estimated: >60 mL/min (ref >60)
Glucose, Bld: 119 mg/dL — ABNORMAL HIGH (ref 70–99)
Potassium: 4.4 mmol/L (ref 3.5–5.1)
Sodium: 140 mmol/L (ref 135–145)

## 2022-09-29 LAB — CBC
HCT: 44.1 % (ref 36.0–46.0)
Hemoglobin: 14.1 g/dL (ref 12.0–15.0)
MCH: 31.5 pg (ref 26.0–34.0)
MCHC: 32 g/dL (ref 30.0–36.0)
MCV: 98.7 fL (ref 80.0–100.0)
Platelets: 320 10*3/uL (ref 150–400)
RBC: 4.47 MIL/uL (ref 3.87–5.11)
RDW: 13.5 % (ref 11.5–15.5)
WBC: 7.8 10*3/uL (ref 4.0–10.5)
nRBC: 0 % (ref 0.0–0.2)

## 2022-10-03 ENCOUNTER — Encounter (HOSPITAL_COMMUNITY): Payer: Self-pay | Admitting: Medical

## 2022-10-03 ENCOUNTER — Encounter (HOSPITAL_COMMUNITY): Payer: Self-pay

## 2022-10-03 NOTE — Anesthesia Preprocedure Evaluation (Deleted)
Anesthesia Evaluation    Airway        Dental   Pulmonary           Cardiovascular      Neuro/Psych    GI/Hepatic   Endo/Other    Renal/GU      Musculoskeletal   Abdominal   Peds  Hematology   Anesthesia Other Findings   Reproductive/Obstetrics                              Anesthesia Physical Anesthesia Plan  ASA:   Anesthesia Plan:    Post-op Pain Management:    Induction:   PONV Risk Score and Plan:   Airway Management Planned:   Additional Equipment:   Intra-op Plan:   Post-operative Plan:   Informed Consent:   Plan Discussed with:   Anesthesia Plan Comments: (See PAT note from 7/1 by Sherlie Ban PA-C )         Anesthesia Quick Evaluation

## 2022-10-03 NOTE — Progress Notes (Addendum)
Choose an anesthesia record to view details        DISCUSSION: Valerie Fields is a 72 yo female who presents to PAT prior to incisional hernia repair with Dr. Dossie Der on 10/17/22. PMH significant for HTN, HLD, CAD (s/p NSTEMI 2021 --> DES), ischemic cardiomyopathy, severe OSA (on CPAP), asthma, GERD. Having an incisional hernia repair after major abdominal surgeries in Feb 2023 and Nov 2023.  No prior anesthesia complications.  Patient follows with Cardiology. Last seen in clinic on 02/26/22. Has been doing well from cardiac standpoint. Has good functional status and denies any symptoms to PAT RN. Pre op risk assessment and clearance provided on 09/10/22 via telephone visit:   "Preoperative Cardiovascular Risk Assessment: According to the Revised Cardiac Risk Index (RCRI), her Perioperative Risk of Major Cardiac Event is (%): 6.6 due to history of ACS with subsequent ICM. Her Functional Capacity in METs is: 7.04 according to the Duke Activity Status Index (DASI). The patient is doing well from a cardiac perspective. Therefore, based on ACC/AHA guidelines, the patient would be at acceptable risk for the planned procedure without further cardiovascular testing.  Her Plavix may be held for 5 days prior to her procedure. Her aspirin should be continued throughout her perioperative procedure. Please resume Plavix as soon as hemostasis is achieved."   Last dose of Plavix: 10-11-22   VS: BP 135/77   Pulse (!) 58   Temp 36.8 C (Oral)   Resp 20   Ht 5\' 2"  (1.575 m)   Wt 99.2 kg   SpO2 98%   BMI 40.02 kg/m   PROVIDERS: PCP - Jarrett Soho, PA-C Cardiologist - Earney Hamburg, MD   LABS: Labs reviewed: Acceptable for surgery. (all labs ordered are listed, but only abnormal results are displayed)  Labs Reviewed  BASIC METABOLIC PANEL - Abnormal; Notable for the following components:      Result Value   Glucose, Bld 119 (*)    All other components within normal limits  CBC      IMAGES:  CT A/P 06/16/22:  IMPRESSION: Small to moderate left ventricle abdominal wall hernia at the prior colostomy site, which contains only fat.   Small midline epigastric ventral hernia, which contains a single small bowel loop. No evidence of bowel obstruction or strangulation.   Mild hepatic steatosis.   Stable benign left adrenal myelolipoma (no followup imaging is recommended).   Aortic Atherosclerosis (ICD10-I70.0).   EKG: n/a   CV:  Echo 06/07/21:  IMPRESSIONS     1. Left ventricular ejection fraction, by estimation, is 55%. The left  ventricle has low normal function. Left ventricular endocardial border not  optimally defined to evaluate regional wall motion. Left ventricular  diastolic parameters are consistent  with Grade II diastolic dysfunction (pseudonormalization).   2. Right ventricular systolic function is normal. The right ventricular  size is normal. There is moderately elevated pulmonary artery systolic  pressure.   3. Moderate pleural effusion in the left lateral region.   4. The mitral valve is grossly normal. Trivial mitral valve  regurgitation. Moderate mitral annular calcification.   5. The aortic valve is tricuspid. Aortic valve regurgitation is not  visualized. No aortic stenosis is present.   6. The inferior vena cava is dilated in size with >50% respiratory  variability, suggesting right atrial pressure of 8 mmHg.   Left heart cath 7/15/221:  Conclusions: Severe single-vessel CAD with 99% stenosis of mid LAD involving D1 with TIMI-1 flow. Moderately to severely reduced left ventricular systolic function  with mid and apical anterior hypokinesis/akinesis.  LVEF ~35%. Mildly elevated left ventricular filling pressure (LVEDP 20-25 mmHg). Successful PCI to mid LAD/D1 using Resolute Onyx 2.75 x 22 mm drug-eluting stent and kissing balloon inflation in LAD/D1 with 0% residual stenosis and TIMI-3 flow.  Past Medical History:   Diagnosis Date   Allergy    Anemia    Arthritis    Asthma    Chronic combined systolic and diastolic CHF 10/15/2019   Ischemic CM // Echocardiogram 7/21: EF 40-45, Gr 2 DD, mid to apical ant/ant-sept AK, RVSP 23.4, trivial MR   Coronary artery disease    NSTEMI 7/21: oLAD 30, pLAD 99 >> PCI: DES; D1 90 >> PCI: POBA, prox and mid RCA 15, EF 35   Diverticulitis    Gallbladder problem    GERD (gastroesophageal reflux disease)    History of heart attack    Hyperlipidemia 10/15/2019   Zetia DC'd during admit for NSTEMI 7/21 >> ? to Atorvastatin   IBS (irritable colon syndrome)    Ischemic cardiomyopathy 10/15/2019   Joint pain    Lactose intolerance    Multiple food allergies    Non-ST elevation (NSTEMI) MI 10/13/2019   PCI:  DES to LAD and POBA to D1    Past Surgical History:  Procedure Laterality Date   BREAST BIOPSY Left    No Scar seen    BREAST CYST EXCISION Left    No scar seen    CARDIAC CATHETERIZATION     COLONOSCOPY     COLONOSCOPY     COLOSTOMY REVERSAL     CORONARY STENT INTERVENTION  10/13/2019   CORONARY STENT INTERVENTION N/A 10/13/2019   Procedure: CORONARY STENT INTERVENTION;  Surgeon: Yvonne Kendall, MD;  [facility];  Service: Cardiovascular;  Laterality: N/A;  mid lad   FLEXIBLE SIGMOIDOSCOPY N/A 05/19/2021   Procedure: FLEXIBLE SIGMOIDOSCOPY;  Surgeon: Hilarie Fredrickson, MD;  [facility]: [facility];  Service: Endoscopy;  Laterality: N/A;   FLEXIBLE SIGMOIDOSCOPY N/A 01/29/2022   Procedure: FLEXIBLE SIGMOIDOSCOPY;  Surgeon: Andria Meuse, MD;  Location: [facility];  Service: General;  Laterality: N/A;   LAPAROTOMY N/A 05/20/2021   Procedure: EXPLORATORY LAPAROTOMY WITH RIGHT HEMI  COLECTOMY; CREATION OF LOOP SIGMOID COLOSTOMY;  Surgeon: Berna Bue, MD;  Location: [facility];  Service: General;  Laterality: N/A;   LEFT HEART CATH AND CORONARY ANGIOGRAPHY N/A 10/13/2019   Procedure: LEFT HEART CATH AND CORONARY ANGIOGRAPHY;  Surgeon:  Yvonne Kendall, MD;  [facility];  Service: Cardiovascular;  Laterality: N/A;   TONSILLECTOMY     XI ROBOTIC ASSISTED LOWER ANTERIOR RESECTION N/A 01/29/2022   Procedure: ROBOTIC COVERTED TO OPEN LOWER ANTERIOR RESECTION, LYSIS OF ADHESIONS WITH TAKEDOWN OF LOOP COLOSTOMY;  Surgeon: Andria Meuse, MD;  Location: [facility];  Service: General;  Laterality: N/A;    MEDICATIONS:  omeprazole (PRILOSEC OTC) 20 MG tablet   acetaminophen (TYLENOL) 650 MG CR tablet   atorvastatin (LIPITOR) 80 MG tablet   azelastine (ASTELIN) 0.1 % nasal spray   B Complex-C (SUPER B COMPLEX PO)   calcium carbonate (ALKA-SELTZER HEARTBURN) 750 MG chewable tablet   Calcium-Magnesium-Zinc (CAL-MAG-ZINC PO)   cetirizine (ZYRTEC) 10 MG tablet   clopidogrel (PLAVIX) 75 MG tablet   Coenzyme Q10 (COQ10) 200 MG CAPS   docusate sodium (COLACE) 100 MG capsule   loperamide (IMODIUM A-D) 2 MG tablet   losartan (COZAAR) 25 MG tablet   metoprolol succinate (TOPROL-XL) 25 MG 24 hr tablet  montelukast (SINGULAIR) 10 MG tablet   Multiple Vitamin (MULTIVITAMIN WITH MINERALS) TABS tablet   nitroGLYCERIN (NITROSTAT) 0.4 MG SL tablet   Probiotic Product (PROBIOTIC PO)   Psyllium Husk POWD   No current facility-administered medications for this encounter.   Marcille Blanco [facility] Short Stay/Anesthesiology [facility] Phone [phone] 10/03/2022 11:40 AM

## 2022-10-27 ENCOUNTER — Other Ambulatory Visit: Payer: Self-pay | Admitting: Cardiovascular Disease

## 2022-10-28 ENCOUNTER — Other Ambulatory Visit: Payer: Self-pay

## 2022-10-28 ENCOUNTER — Encounter (HOSPITAL_COMMUNITY): Payer: Self-pay | Admitting: Surgery

## 2022-10-28 NOTE — Progress Notes (Signed)
SDW CALL  Patient was given pre-op instructions over the phone. The opportunity was given for the patient to ask questions. No further questions asked. Patient verbalized understanding of instructions given.   PCP - Cala Bradford Cardiologist - Tedra Senegal  PPM/ICD - denies Device Orders -  Rep Notified -   Chest x-ray - na EKG - 02/26/22- obtain DOS Stress Test - none ECHO - 7/15//21 Cardiac Cath - 10/13/19  Sleep Study - 06/27/20 CPAP - yes  Fasting Blood Sugar - na Checks Blood Sugar _____ times a day  Blood Thinner Instructions: Hold Plavix 5 days prior to your procedure. Last dose -7/25.pt stated her last dose was taken Thursday 7/25. Aspirin Instructions:patient states she does not take aspirin  ERAS Protcol -clears until 1000 PRE-SURGERY Ensure or G2- no  COVID TEST- na   Anesthesia review: yes-hx CHF,CAD,MI,cardiac clearance -09/10/22  Patient denies shortness of breath, fever, cough and chest pain over the phone call    Surgical Instructions    Your procedure is scheduled on July 31.  Report to [facility] Main Entrance "A" at 1030 A.M., then check in with the Admitting office.  Call this number if you have problems the morning of surgery:  [phone]    Remember:  Do not eat after midnight the night before your surgery  You may drink clear liquids until 10am the morning of your surgery.   Clear liquids allowed are: Water, Non-Citrus Juices (without pulp), Carbonated Beverages, Clear Tea, Black Coffee ONLY (NO MILK, CREAM OR POWDERED CREAMER of any kind), and Gatorade   Take these medicines the morning of surgery with A SIP OF WATER:  Toprol XL. PRN: Astelin nasal spray,prilosec OTC  As of today, STOP taking any Aspirin (unless otherwise instructed by your surgeon) Aleve, Naproxen, Ibuprofen, Motrin, Advil, Goody's, BC's, all herbal medications, fish oil, and all vitamins.  [facility] is not responsible for any belongings or  valuables. .   Do NOT Smoke (Tobacco/Vaping)  24 hours prior to your procedure  If you use a CPAP at night, you may bring your mask for your overnight stay.   Contacts, glasses, hearing aids, dentures or partials may not be worn into surgery, please bring cases for these belongings   Patients discharged the day of surgery will not be allowed to drive home, and someone needs to stay with them for 24 hours.   Special instructions:    Oral Hygiene is also important to reduce your risk of infection.  Remember - BRUSH YOUR TEETH THE MORNING OF SURGERY WITH YOUR REGULAR TOOTHPASTE   Day of Surgery:  Take a shower the day of or night before with antibacterial soap. Wear Clean/Comfortable clothing the morning of surgery Do not apply any deodorants/lotions.   Do not wear jewelry or makeup Do not wear lotions, powders, perfumes/colognes, or deodorant. Do not shave 48 hours prior to surgery.  Men may shave face and neck. Do not bring valuables to the hospital. Do not wear nail polish, gel polish, artificial nails, or any other type of covering on natural nails (fingers and toes) If you have artificial nails or gel coating that need to be removed by a nail salon, please have this removed prior to surgery. Artificial nails or gel coating may interfere with anesthesia's ability to adequately monitor your vital signs. Remember to brush your teeth WITH YOUR REGULAR TOOTHPASTE.

## 2022-10-29 ENCOUNTER — Other Ambulatory Visit: Payer: Self-pay

## 2022-10-29 ENCOUNTER — Inpatient Hospital Stay (HOSPITAL_COMMUNITY): Payer: Self-pay

## 2022-10-29 ENCOUNTER — Inpatient Hospital Stay (HOSPITAL_COMMUNITY)
Admission: RE | Admit: 2022-10-29 | Discharge: 2022-10-30 | DRG: 354 | Disposition: A | Discharge status: Home / Self Care | Payer: Medicare HMO | Attending: Surgery | Admitting: Surgery

## 2022-10-29 ENCOUNTER — Inpatient Hospital Stay (HOSPITAL_COMMUNITY): Payer: Medicare HMO

## 2022-10-29 ENCOUNTER — Encounter (HOSPITAL_COMMUNITY): Payer: Self-pay | Admitting: Surgery

## 2022-10-29 ENCOUNTER — Encounter (HOSPITAL_COMMUNITY)
Admission: RE | Disposition: A | Discharge status: Home / Self Care | Payer: Self-pay | Source: Home / Self Care | Attending: Surgery

## 2022-10-29 DIAGNOSIS — I2510 Atherosclerotic heart disease of native coronary artery without angina pectoris: Secondary | ICD-10-CM | POA: Diagnosis not present

## 2022-10-29 DIAGNOSIS — K432 Incisional hernia without obstruction or gangrene: Secondary | ICD-10-CM | POA: Diagnosis present

## 2022-10-29 DIAGNOSIS — Z79899 Other long term (current) drug therapy: Secondary | ICD-10-CM

## 2022-10-29 DIAGNOSIS — Z7902 Long term (current) use of antithrombotics/antiplatelets: Secondary | ICD-10-CM | POA: Diagnosis not present

## 2022-10-29 DIAGNOSIS — I110 Hypertensive heart disease with heart failure: Secondary | ICD-10-CM

## 2022-10-29 DIAGNOSIS — I5042 Chronic combined systolic (congestive) and diastolic (congestive) heart failure: Secondary | ICD-10-CM | POA: Diagnosis present

## 2022-10-29 DIAGNOSIS — K219 Gastro-esophageal reflux disease without esophagitis: Secondary | ICD-10-CM | POA: Diagnosis present

## 2022-10-29 DIAGNOSIS — Z800 Family history of malignant neoplasm of digestive organs: Secondary | ICD-10-CM | POA: Diagnosis not present

## 2022-10-29 DIAGNOSIS — Z955 Presence of coronary angioplasty implant and graft: Secondary | ICD-10-CM

## 2022-10-29 DIAGNOSIS — I252 Old myocardial infarction: Secondary | ICD-10-CM

## 2022-10-29 DIAGNOSIS — E785 Hyperlipidemia, unspecified: Secondary | ICD-10-CM | POA: Diagnosis present

## 2022-10-29 DIAGNOSIS — E739 Lactose intolerance, unspecified: Secondary | ICD-10-CM | POA: Diagnosis present

## 2022-10-29 DIAGNOSIS — Z882 Allergy status to sulfonamides status: Secondary | ICD-10-CM

## 2022-10-29 DIAGNOSIS — I5032 Chronic diastolic (congestive) heart failure: Secondary | ICD-10-CM

## 2022-10-29 DIAGNOSIS — G4733 Obstructive sleep apnea (adult) (pediatric): Secondary | ICD-10-CM | POA: Diagnosis present

## 2022-10-29 DIAGNOSIS — Z83438 Family history of other disorder of lipoprotein metabolism and other lipidemia: Secondary | ICD-10-CM

## 2022-10-29 HISTORY — DX: Sleep apnea, unspecified: G47.30

## 2022-10-29 HISTORY — PX: INSERTION OF MESH: SHX5868

## 2022-10-29 HISTORY — PX: XI ROBOTIC ASSISTED VENTRAL HERNIA: SHX6789

## 2022-10-29 SURGERY — REPAIR, HERNIA, VENTRAL, ROBOT-ASSISTED
Anesthesia: General | Site: Abdomen

## 2022-10-29 MED ORDER — LIDOCAINE 5 % EX PTCH
1.0000 | MEDICATED_PATCH | CUTANEOUS | Status: DC
  Filled 2022-10-29: qty 1

## 2022-10-29 MED ORDER — DEXAMETHASONE SODIUM PHOSPHATE 10 MG/ML IJ SOLN
INTRAMUSCULAR | Status: DC | PRN
  Administered 2022-10-29: 10 mg via INTRAVENOUS

## 2022-10-29 MED ORDER — SUGAMMADEX SODIUM 200 MG/2ML IV SOLN
INTRAVENOUS | Status: DC | PRN
  Administered 2022-10-29 (×2): 100 mg via INTRAVENOUS

## 2022-10-29 MED ORDER — PROPOFOL 10 MG/ML IV BOLUS
INTRAVENOUS | Status: AC
  Filled 2022-10-29: qty 20

## 2022-10-29 MED ORDER — ONDANSETRON HCL 4 MG/2ML IJ SOLN
INTRAMUSCULAR | Status: DC | PRN
  Administered 2022-10-29: 4 mg via INTRAVENOUS

## 2022-10-29 MED ORDER — LACTATED RINGERS IV SOLN
INTRAVENOUS | Status: DC | PRN
Start: 2022-10-29 — End: 2022-10-29

## 2022-10-29 MED ORDER — ONDANSETRON HCL 4 MG/2ML IJ SOLN: 4.0000 mg | Freq: Four times a day (QID) | INTRAMUSCULAR | Status: DC | PRN

## 2022-10-29 MED ORDER — HYDROMORPHONE HCL 1 MG/ML IJ SOLN
0.5000 mg | INTRAMUSCULAR | Status: DC | PRN
  Administered 2022-10-29 (×2): 0.5 mg via INTRAVENOUS

## 2022-10-29 MED ORDER — BUPIVACAINE LIPOSOME 1.3 % IJ SUSP
INTRAMUSCULAR | Status: DC | PRN
  Administered 2022-10-29: 50 mL

## 2022-10-29 MED ORDER — DOCUSATE SODIUM 100 MG PO CAPS
100.0000 mg | ORAL_CAPSULE | Freq: Two times a day (BID) | ORAL | Status: DC
  Administered 2022-10-30 (×2): 100 mg via ORAL
  Filled 2022-10-29 (×3): qty 1

## 2022-10-29 MED ORDER — ROCURONIUM BROMIDE 10 MG/ML (PF) SYRINGE
PREFILLED_SYRINGE | INTRAVENOUS | Status: DC | PRN
  Administered 2022-10-29: 100 mg via INTRAVENOUS
  Administered 2022-10-29 (×2): 20 mg via INTRAVENOUS

## 2022-10-29 MED ORDER — SIMETHICONE 80 MG PO CHEW: 80.0000 mg | CHEWABLE_TABLET | Freq: Four times a day (QID) | ORAL | Status: DC | PRN

## 2022-10-29 MED ORDER — ATORVASTATIN CALCIUM 80 MG PO TABS
80.0000 mg | ORAL_TABLET | Freq: Every day | ORAL | Status: DC
  Filled 2022-10-29: qty 1

## 2022-10-29 MED ORDER — PROCHLORPERAZINE EDISYLATE 10 MG/2ML IJ SOLN: 10.0000 mg | INTRAMUSCULAR | Status: DC | PRN

## 2022-10-29 MED ORDER — AMISULPRIDE (ANTIEMETIC) 5 MG/2ML IV SOLN: 10.0000 mg | Freq: Once | INTRAVENOUS | Status: DC | PRN

## 2022-10-29 MED ORDER — HEPARIN SODIUM (PORCINE) 5000 UNIT/ML IJ SOLN
5000.0000 [IU] | Freq: Three times a day (TID) | INTRAMUSCULAR | Status: DC
  Administered 2022-10-30 (×3): 5000 [IU] via SUBCUTANEOUS
  Filled 2022-10-29 (×3): qty 1

## 2022-10-29 MED ORDER — OXYCODONE HCL 5 MG/5ML PO SOLN: 5.0000 mg | Freq: Once | ORAL | Status: DC | PRN

## 2022-10-29 MED ORDER — CHLORHEXIDINE GLUCONATE CLOTH 2 % EX PADS: 6.0000 | MEDICATED_PAD | Freq: Once | CUTANEOUS | Status: DC

## 2022-10-29 MED ORDER — ACETAMINOPHEN 500 MG PO TABS: 1000.0000 mg | ORAL_TABLET | Freq: Once | ORAL | Status: DC

## 2022-10-29 MED ORDER — GABAPENTIN 300 MG PO CAPS
300.0000 mg | ORAL_CAPSULE | ORAL | Status: AC
  Administered 2022-10-29: 300 mg via ORAL
  Filled 2022-10-29: qty 1

## 2022-10-29 MED ORDER — BUPIVACAINE LIPOSOME 1.3 % IJ SUSP
INTRAMUSCULAR | Status: AC
  Filled 2022-10-29: qty 20

## 2022-10-29 MED ORDER — SIMETHICONE 80 MG PO CHEW: 40.0000 mg | CHEWABLE_TABLET | Freq: Four times a day (QID) | ORAL | Status: DC | PRN

## 2022-10-29 MED ORDER — FENTANYL CITRATE (PF) 100 MCG/2ML IJ SOLN
INTRAMUSCULAR | Status: AC
  Filled 2022-10-29: qty 2

## 2022-10-29 MED ORDER — PANTOPRAZOLE SODIUM 40 MG PO TBEC
40.0000 mg | DELAYED_RELEASE_TABLET | Freq: Every day | ORAL | Status: DC
  Administered 2022-10-30: 40 mg via ORAL
  Filled 2022-10-29 (×2): qty 1

## 2022-10-29 MED ORDER — AMISULPRIDE (ANTIEMETIC) 5 MG/2ML IV SOLN
INTRAVENOUS | Status: AC
  Filled 2022-10-29: qty 4

## 2022-10-29 MED ORDER — PHENYLEPHRINE HCL-NACL 20-0.9 MG/250ML-% IV SOLN
INTRAVENOUS | Status: DC | PRN
  Administered 2022-10-29: 50 ug/min via INTRAVENOUS

## 2022-10-29 MED ORDER — 0.9 % SODIUM CHLORIDE (POUR BTL) OPTIME
TOPICAL | Status: DC | PRN
  Administered 2022-10-29: 1000 mL

## 2022-10-29 MED ORDER — LACTATED RINGERS IV SOLN: INTRAVENOUS | Status: DC

## 2022-10-29 MED ORDER — PHENYLEPHRINE 80 MCG/ML (10ML) SYRINGE FOR IV PUSH (FOR BLOOD PRESSURE SUPPORT)
PREFILLED_SYRINGE | INTRAVENOUS | Status: AC
  Filled 2022-10-29: qty 10

## 2022-10-29 MED ORDER — BUPIVACAINE HCL (PF) 0.25 % IJ SOLN
INTRAMUSCULAR | Status: AC
  Filled 2022-10-29: qty 30

## 2022-10-29 MED ORDER — ACETAMINOPHEN 500 MG PO TABS
1000.0000 mg | ORAL_TABLET | ORAL | Status: AC
  Administered 2022-10-29: 1000 mg via ORAL
  Filled 2022-10-29: qty 2

## 2022-10-29 MED ORDER — GABAPENTIN 300 MG PO CAPS
300.0000 mg | ORAL_CAPSULE | Freq: Three times a day (TID) | ORAL | Status: DC
  Administered 2022-10-30 (×2): 300 mg via ORAL
  Filled 2022-10-29 (×2): qty 1

## 2022-10-29 MED ORDER — HYDROMORPHONE HCL 1 MG/ML IJ SOLN
INTRAMUSCULAR | Status: AC
  Filled 2022-10-29: qty 1

## 2022-10-29 MED ORDER — LIDOCAINE 2% (20 MG/ML) 5 ML SYRINGE
INTRAMUSCULAR | Status: DC | PRN
  Administered 2022-10-29: 100 mg via INTRAVENOUS

## 2022-10-29 MED ORDER — ACETAMINOPHEN 325 MG PO TABS
650.0000 mg | ORAL_TABLET | Freq: Four times a day (QID) | ORAL | Status: DC
  Administered 2022-10-30 (×2): 650 mg via ORAL
  Filled 2022-10-29 (×3): qty 2

## 2022-10-29 MED ORDER — FENTANYL CITRATE (PF) 250 MCG/5ML IJ SOLN
INTRAMUSCULAR | Status: AC
  Filled 2022-10-29: qty 5

## 2022-10-29 MED ORDER — SODIUM CHLORIDE 0.9 % IV SOLN: INTRAVENOUS | Status: DC

## 2022-10-29 MED ORDER — FENTANYL CITRATE (PF) 250 MCG/5ML IJ SOLN
INTRAMUSCULAR | Status: DC | PRN
  Administered 2022-10-29: 50 ug via INTRAVENOUS
  Administered 2022-10-29: 100 ug via INTRAVENOUS

## 2022-10-29 MED ORDER — ACETAMINOPHEN 10 MG/ML IV SOLN
1000.0000 mg | Freq: Once | INTRAVENOUS | Status: DC | PRN
  Administered 2022-10-29: 1000 mg via INTRAVENOUS

## 2022-10-29 MED ORDER — METHOCARBAMOL 1000 MG/10ML IJ SOLN: 500.0000 mg | Freq: Four times a day (QID) | INTRAVENOUS | Status: DC | PRN

## 2022-10-29 MED ORDER — OXYCODONE HCL 5 MG PO TABS
5.0000 mg | ORAL_TABLET | ORAL | Status: DC | PRN
  Administered 2022-10-30: 5 mg via ORAL
  Filled 2022-10-29: qty 1

## 2022-10-29 MED ORDER — EPHEDRINE SULFATE (PRESSORS) 50 MG/ML IJ SOLN
INTRAMUSCULAR | Status: DC | PRN
  Administered 2022-10-29: 10 mg via INTRAVENOUS

## 2022-10-29 MED ORDER — LIDOCAINE 2% (20 MG/ML) 5 ML SYRINGE
INTRAMUSCULAR | Status: AC
  Filled 2022-10-29: qty 5

## 2022-10-29 MED ORDER — BUPIVACAINE LIPOSOME 1.3 % IJ SUSP: 20.0000 mL | Freq: Once | INTRAMUSCULAR | Status: DC

## 2022-10-29 MED ORDER — CHLORHEXIDINE GLUCONATE 0.12 % MT SOLN
15.0000 mL | Freq: Once | OROMUCOSAL | Status: AC
  Administered 2022-10-29: 15 mL via OROMUCOSAL
  Filled 2022-10-29: qty 15

## 2022-10-29 MED ORDER — OXYCODONE HCL 5 MG PO TABS: 5.0000 mg | ORAL_TABLET | Freq: Once | ORAL | Status: DC | PRN

## 2022-10-29 MED ORDER — CEFAZOLIN SODIUM-DEXTROSE 2-4 GM/100ML-% IV SOLN
2.0000 g | INTRAVENOUS | Status: AC
  Administered 2022-10-29: 2 g via INTRAVENOUS
  Filled 2022-10-29: qty 100

## 2022-10-29 MED ORDER — LORATADINE 10 MG PO TABS
10.0000 mg | ORAL_TABLET | Freq: Every day | ORAL | Status: DC
  Administered 2022-10-30: 10 mg via ORAL
  Filled 2022-10-29 (×2): qty 1

## 2022-10-29 MED ORDER — OXYCODONE HCL 5 MG PO TABS
10.0000 mg | ORAL_TABLET | ORAL | Status: DC | PRN
  Administered 2022-10-30: 10 mg via ORAL
  Filled 2022-10-29: qty 2

## 2022-10-29 MED ORDER — PROPOFOL 10 MG/ML IV BOLUS
INTRAVENOUS | Status: DC | PRN
  Administered 2022-10-29: 150 mg via INTRAVENOUS

## 2022-10-29 MED ORDER — FENTANYL CITRATE (PF) 100 MCG/2ML IJ SOLN
25.0000 ug | INTRAMUSCULAR | Status: DC | PRN
  Administered 2022-10-29 (×3): 50 ug via INTRAVENOUS

## 2022-10-29 MED ORDER — ORAL CARE MOUTH RINSE: 15.0000 mL | Freq: Once | OROMUCOSAL | Status: AC

## 2022-10-29 SURGICAL SUPPLY — 79 items
ADH SKN CLS APL DERMABOND .7 (GAUZE/BANDAGES/DRESSINGS) ×1
APL PRP STRL LF DISP 70% ISPRP (MISCELLANEOUS) ×1
BAG COUNTER SPONGE SURGICOUNT (BAG) IMPLANT
BAG SPNG CNTER NS LX DISP (BAG)
BINDER ABDOMINAL 10 UNV 27-48 (MISCELLANEOUS) IMPLANT
BINDER ABDOMINAL 12 ML 46-62 (SOFTGOODS) IMPLANT
CHLORAPREP W/TINT 26 (MISCELLANEOUS) ×1 IMPLANT
COVER MAYO STAND STRL (DRAPES) ×1 IMPLANT
COVER SURGICAL LIGHT HANDLE (MISCELLANEOUS) ×1 IMPLANT
COVER TIP SHEARS 8 DVNC (MISCELLANEOUS) ×1 IMPLANT
DEFOGGER SCOPE WARMER CLEARIFY (MISCELLANEOUS) ×1 IMPLANT
DERMABOND ADVANCED .7 DNX12 (GAUZE/BANDAGES/DRESSINGS) ×1 IMPLANT
DEVICE SECURE STRAP 25 ABSORB (INSTRUMENTS) IMPLANT
DEVICE TROCAR PUNCTURE CLOSURE (ENDOMECHANICALS) IMPLANT
DRAPE ARM DVNC X/XI (DISPOSABLE) ×4 IMPLANT
DRAPE COLUMN DVNC XI (DISPOSABLE) ×1 IMPLANT
DRAPE CV SPLIT W-CLR ANES SCRN (DRAPES) ×1 IMPLANT
DRAPE ORTHO SPLIT 77X108 STRL (DRAPES) ×1
DRAPE SURG ORHT 6 SPLT 77X108 (DRAPES) ×1 IMPLANT
DRIVER NDL MEGA 8 DVNC XI (INSTRUMENTS) ×2 IMPLANT
DRIVER NDL MEGA SUTCUT DVNCXI (INSTRUMENTS) ×1 IMPLANT
DRIVER NDLE MEGA DVNC XI (INSTRUMENTS) ×2
DRIVER NDLE MEGA SUTCUT DVNCXI (INSTRUMENTS) ×1
FORCEPS BPLR FENES DVNC XI (FORCEP) ×1 IMPLANT
FORCEPS PROGRASP DVNC XI (FORCEP) ×1 IMPLANT
GLOVE BIO SURGEON STRL SZ7.5 (GLOVE) ×1 IMPLANT
GLOVE BIOGEL PI IND STRL 8 (GLOVE) ×1 IMPLANT
GOWN STRL REUS W/ TWL LRG LVL3 (GOWN DISPOSABLE) ×2 IMPLANT
GOWN STRL REUS W/ TWL XL LVL3 (GOWN DISPOSABLE) ×1 IMPLANT
GOWN STRL REUS W/TWL 2XL LVL3 (GOWN DISPOSABLE) ×1 IMPLANT
GOWN STRL REUS W/TWL LRG LVL3 (GOWN DISPOSABLE) ×2
GOWN STRL REUS W/TWL XL LVL3 (GOWN DISPOSABLE) ×1
GRASPER TIP-UP FEN DVNC XI (INSTRUMENTS) ×1 IMPLANT
IRRIG SUCT STRYKERFLOW 2 WTIP (MISCELLANEOUS)
IRRIGATION SUCT STRKRFLW 2 WTP (MISCELLANEOUS) IMPLANT
KIT BASIN OR (CUSTOM PROCEDURE TRAY) ×1 IMPLANT
KIT TURNOVER KIT B (KITS) ×1 IMPLANT
L-HOOK LAP DISP 36CM (ELECTROSURGICAL) ×1
LHOOK LAP DISP 36CM (ELECTROSURGICAL) IMPLANT
MARKER SKIN DUAL TIP RULER LAB (MISCELLANEOUS) ×1 IMPLANT
MESH SOFT 12X12IN BARD (Mesh General) IMPLANT
NDL HYPO 22X1.5 SAFETY MO (MISCELLANEOUS) IMPLANT
NDL INSUFFLATION 14GA 120MM (NEEDLE) IMPLANT
NDL SPNL 18GX3.5 QUINCKE PK (NEEDLE) ×1 IMPLANT
NEEDLE HYPO 22X1.5 SAFETY MO (MISCELLANEOUS) ×1
NEEDLE INSUFFLATION 14GA 120MM (NEEDLE)
NEEDLE SPNL 18GX3.5 QUINCKE PK (NEEDLE) ×1
OBTURATOR OPTICAL STND 8 DVNC (TROCAR) ×1
OBTURATOR OPTICALSTD 8 DVNC (TROCAR) ×1 IMPLANT
PAD ARMBOARD 7.5X6 YLW CONV (MISCELLANEOUS) ×2 IMPLANT
PENCIL SMOKE EVACUATOR (MISCELLANEOUS) IMPLANT
SCISSORS LAP 5X35 DISP (ENDOMECHANICALS) IMPLANT
SCISSORS MNPLR CVD DVNC XI (INSTRUMENTS) ×1 IMPLANT
SEAL UNIV 5-12 XI (MISCELLANEOUS) ×3 IMPLANT
SET TUBE SMOKE EVAC HIGH FLOW (TUBING) ×1 IMPLANT
SPIKE FLUID TRANSFER (MISCELLANEOUS) ×1 IMPLANT
STOPCOCK 4 WAY LG BORE MALE ST (IV SETS) ×1 IMPLANT
SUT DVC VLOC 180 0 12IN GS21 (SUTURE)
SUT DVC VLOC 180 2-0 12IN GS21 (SUTURE) ×2
SUT ETHIBOND CT1 BRD #0 30IN (SUTURE) IMPLANT
SUT MNCRL AB 4-0 PS2 18 (SUTURE) ×1 IMPLANT
SUT STRATAFIX 1PDS 45CM VIOLET (SUTURE) IMPLANT
SUT VIC AB 2-0 SH 27 (SUTURE)
SUT VIC AB 2-0 SH 27X BRD (SUTURE) IMPLANT
SUT VLOC 180 0 9IN GS21 (SUTURE) IMPLANT
SUT VLOC 180 2-0 6IN GS21 (SUTURE) IMPLANT
SUT VLOC 180 2-0 9IN GS21 (SUTURE) IMPLANT
SUTURE DVC VL 180 2-0 12INGS21 (SUTURE) IMPLANT
SUTURE DVC VLC 180 0 12IN GS21 (SUTURE) IMPLANT
TOWEL GREEN STERILE FF (TOWEL DISPOSABLE) ×1 IMPLANT
TRAY FOLEY W/BAG SLVR 16FR (SET/KITS/TRAYS/PACK) ×1
TRAY FOLEY W/BAG SLVR 16FR ST (SET/KITS/TRAYS/PACK) IMPLANT
TRAY LAPAROSCOPIC MC (CUSTOM PROCEDURE TRAY) ×1 IMPLANT
TROCAR 5MMX150MM (TROCAR) ×1 IMPLANT
TROCAR ADV FIXATION 11X100MM (TROCAR) IMPLANT
TROCAR BALLN GELPORT 12X130M (ENDOMECHANICALS) IMPLANT
TROCAR XCEL NON-BLD 5MMX100MML (ENDOMECHANICALS) IMPLANT
TROCAR Z THREAD OPTICAL 12X100 (TROCAR) IMPLANT
TROCAR Z-THREAD FIOS 5X100MM (TROCAR) IMPLANT

## 2022-10-29 NOTE — Op Note (Signed)
Patient: Valerie Fields (1951-03-02, [id number])  Date of Surgery: 10/29/2022   Preoperative Diagnosis: INCISIONAL HERNIA (Fascial defect 18.6 x 9.4 cm on preoperative CT scan)  Postoperative Diagnosis: INCISIONAL HERNIA (Fascial defect 18.6 x 9.4 cm on preoperative CT scan)  Surgical Procedure:  ROBOTIC INCISIONAL HERNIA REPAIR WITH MESH BILATERAL POSTERIOR RECTUS MYOFASCIAL RELEASE BILATERAL TRANSVERSUS ABDOMINIS MYOFASCIAL RELEASE  Operative Team Members:  Surgeons and Role:    * Delvonte Berenson, Hyman Hopes, MD - Primary   Anesthesiologist: Mariann Barter, MD; Jairo Ben, MD; Val Eagle, MD CRNA: Laruth Bouchard., CRNA; Randon Goldsmith, CRNA   Anesthesia: General   Fluids:  Total I/O In: 400 [I.V.:400] Out: 160 [Urine:150; Blood:10]  Complications: None  Drains:  None  Specimen: * No specimens in log *   Disposition:  PACU - hemodynamically stable.  Plan of Care: Admit to inpatient   Indications for Procedure:  Ms. Angelo has a history of HTN, HLD, CAD (s/p NSTEMI 2021 --> DES), ischemic cardiomyopathy, OSA (on CPAP). She is well known to our practice. Her recent abdominal surgical issues started February of 2023, when Dr. Fredricka Bonine performed an exploratory laparotomy, right hemicolectomy and loop sigmoid colostomy for diverticular stricture. Colonoscopy on 10/29/21, found normal-appearing rectum, diverticulosis but otherwise normal colon. Healthy/patent ileocolic anastomosis. Normal-appearing neoterminal ileum. She then underwent robotic converted to open low anterior resection with loop colostomy takedown and lysis of adhesions with dr. Cliffton Asters on 01/29/2022. She has developed a bulge around her ostomy site and laparotomy, and was referred to my office for consideration for hernia repair.  I recommended robotic incisional hernia repair with mesh. The hernia measures 18.6 x 9.4 cm on CT scan. We discussed the procedure itself as well as its risk, benefits, and  alternatives. After full discussion all questions answered the patient granted consent to proceed. Our surgery scheduler will reach out to the patient to schedule surgery.    Findings:  Hernia Location: Ventral hernia location: Epigastric (M2), Umbilical (M3), and Infraumbilical (M4) Hernia Size:  18.6 cm x 9.4 cm Mesh Size &Type:  30 cm tall x 27 cm wide Bard Soft Mesh Mesh Position: Sublay - Retromuscular Myofascial Releases:  Bilateral posterior rectus myofascial release Bilateral transversus abdominus myofascial release   Description of Procedure: The patient was positioned supine, moderately flexed at the umbilical level, padded and secured on the operating table.  A timeout procedure was performed.    What is described is a robotic, totally extraperitoneal retromuscular incisional hernia repair with bilateral rectus myofascial release and retromuscular mesh placement.  Laparoscopic Portion: The retrorectus space was entered in the LEFT hypochondrium, at approximately the midclavicular line utilizing a 5 mm optical-viewing trocar.  Upon safe entry into this space, it was insufflated while performing a blunt dissection with the camera still in the optical trocar. A rectus myofascial release was performed on the LEFT side. Dissection was carried out laterally in the retromuscular plane to the edge of the rectus sheath progressively disconnecting the rectus muscle from the underlying posterior rectus sheath. Both the segmental innervation as well as the intercostal artery and vein brances to the rectus muscle were individually preserved.    During the left sided retrorectus dissection, a 12 mm trocar was placed into the lateral most edge of the retrorectus space.  With these initial trocars in position, the medial most aspect of the retrorectus plane was identified, and the posterior sheath was visualized as it inserted on the linea alba. The posterior sheath was incised with cautery entering  the preperitoneal plane. A crossover was performed dissecting under the linea alba in the preperitoneal plane until the right rectus sheath was identified.  After identification of the right rectus sheath, it was incised vertically to enter the retrorectus space on the right. A rectus myofascial release was performed on the RIGHT side.  Blunt dissection was carried out laterally in the retromuscular plane to the edge of the rectus sheath progressively disconnecting the rectus muscle from the underlying posterior rectus sheath. Both the segmental innervation as well as the intercostal artery and vein brances to the rectus muscle were individually preserved.   At this juncture, both retrorectus planes were initially connected to each other and there was space for further trocar placement. An 8 mm robotic trocar was placed in the midclavicular line in right retrorectus space.  A 8mm robotic trocar was placed within the left rectus musculature in the upper abdomen, and not through the linea alba.  The initial 5 mm access trocar in the midclavicular line within the left retrorectus space was switched out for an 8 mm robotic trocar.   Robotic Portion: The Intuitive daVinci Xi surgical robot was docked in the standard fashion and the procedure begun from the robotic console. A Prograsp instrument and monopolar shears were used for the dissection.  Dissection was carried down inferiorly preserving the peritoneum and the preperitoneal fat in the midline as it was gently dissected off of the overlying linea alba.  On the right side, the posterior rectus sheath was progressively disconnected from its insertion on the linea alba. This allowed for progression of the right side rectus myofascial release.  The rectus myofascial release accomplished medialization of the posterior rectus sheath towards the midline and disinsertion of the rectus muscle from its surrounding fascia, and thus its encasement in the rectus  sheath, allowing for widening of the rectus muscle and transfer of the rectus flap towards the midline.  This will allow for future inset of the medial aspect of the flap for abdominal wall reconstruction.  Similarly, on the left side, the posterior rectus sheath was also progressively disconnected from its insertion on the linea alba.  This allowed for progression of the left side rectus myofascial release.  The rectus myofascial release accomplished medialization of the posterior rectus sheath towards the midline and disinsertion of the rectus muscle from its surrounding fascia, and thus its encasement in the rectus sheath, allowing for widening of the rectus muscle and transfer of the rectus flap towards the midline.  This will allow for future inset of the medial aspect of the flap for abdominal wall reconstruction.  During the dissection of the midline the hernia defect was identified and the hernia sac was reduced to preserve integrity of the visceral sac.  The peritoneum in the midline was quite thin with visible small intestine, so the posterior rectus sheath was closed with 2-0 v-loc at the conclusion of the dissection to ensure the integrity of the visceral sac.  Both the left and the right rectus myofascial releases were performed towards the lower abdomen, past the arcuate line bilaterally.  During this dissection, the peritoneum and preperitoneal fat in the midline were further preserved below the hernia as they were dissected off of the overlying linea alba.   I continued the dissection to coopers ligament and dissected out the preperitoneal space bilaterally similar to a totally extraperitoneal bilateral inguinal hernia repair.  No inguinal hernias were identified, but this provided additional area for mesh overlap and facilitated performing the  transversus abdominis release.    There was no hernia identified on the RIGHT.  The round ligament was identified and divided utilizing cautery.   A large pre peritoneal dissection was performed to uncover the direct, indirect, femoral and obturator spaces.  Cooper's ligament was uncovered medially and the psoas muscle uncovered laterally.  There was no hernia identified on the LEFT.  The round ligament was identified and divided utilizing cautery.  A large pre peritoneal dissection was performed to uncover the direct, indirect, femoral and obturator spaces.  Cooper's ligament was uncovered medially and the psoas muscle uncovered laterally.  The hernia defect area was now visualized fully.  The hernia defects were located in the epigastric, umbilical, infraumbilical regions.  The hernia area involved a swiss cheese defect along the midline and a large hernia through the left rectus muscle at the location of the previous ostomy site.  The hernia defect area was consistent with the fascial defect measurements from the preoperative CT scan of 18.6 cm x 9.4 cm.  The midline hernia defect was closed utilizing a continuous, #1 Ethicon Stratafix Symmetric PDS Plus suture.  The hernia defect, and subsequently the rectus musculature, came together well for a complete abdominal wall reconstruction.  The ostomy site hernia defect was also closed using #1 Ethicon Stratafix symmetric PDS Plus suture.The dissected out retrorectus space was measured with a metric ruler so as to determine the size of the proposed mesh.    The robot was undocked and the laparoscope was inserted, inspecting for hemostasis.  The mesh deployment was performed laparoscopically.  Laparoscopic Portion:  A transversus abdominis plane (TAP) block was performed bilaterally with a mixture of marcaine and Exparel.  The anesthetic was first injected into the plane between the transversus abdominis and internal abdominal oblique muscles on the left. The TAP was repeated on the contralateral side.   A piece of Bard Soft Mesh was opened and trimmed to 30 cm tall x 27 cm wide. The mesh was  advanced into the retrorectus space and the mesh positioned flat against the intact posterior rectus sheaths. The mesh was not fixated as it occupied the entire retromuscular plane, and also covered all of the trocars.  The trocars were removed and the skin closed with 4-0 Monocryl subcuticular sutures and skin glue.   Ivar Drape, MD General, Bariatric, & Minimally Invasive Surgery [facility], [place]

## 2022-10-29 NOTE — H&P (Signed)
Admitting Physician: Hyman Hopes Taunya Goral  Service: General surgery  CC: Hernia  Subjective   HPI: Valerie Fields is an 72 y.o. female who is here for robotic hernia repair  Past Medical History:  Diagnosis Date   Allergy    Anemia    Arthritis    Asthma    Chronic combined systolic and diastolic CHF 10/15/2019   Ischemic CM // Echocardiogram 7/21: EF 40-45, Gr 2 DD, mid to apical ant/ant-sept AK, RVSP 23.4, trivial MR   Coronary artery disease    NSTEMI 7/21: oLAD 30, pLAD 99 >> PCI: DES; D1 90 >> PCI: POBA, prox and mid RCA 15, EF 35   Diverticulitis    Gallbladder problem    GERD (gastroesophageal reflux disease)    History of heart attack    Hyperlipidemia 10/15/2019   Zetia DC'd during admit for NSTEMI 7/21 >> ? to Atorvastatin   IBS (irritable colon syndrome)    Ischemic cardiomyopathy 10/15/2019   Joint pain    Lactose intolerance    Multiple food allergies    Non-ST elevation (NSTEMI) MI 10/13/2019   PCI:  DES to LAD and POBA to D1   Sleep apnea     Past Surgical History:  Procedure Laterality Date   APPENDECTOMY     BREAST BIOPSY Left    No Scar seen    BREAST CYST EXCISION Left    No scar seen    CARDIAC CATHETERIZATION     COLONOSCOPY     COLONOSCOPY     COLOSTOMY REVERSAL     CORONARY STENT INTERVENTION  10/13/2019   CORONARY STENT INTERVENTION N/A 10/13/2019   Procedure: CORONARY STENT INTERVENTION;  Surgeon: Yvonne Kendall, MD;  Location: [facility];  Service: Cardiovascular;  Laterality: N/A;  mid lad   FLEXIBLE SIGMOIDOSCOPY N/A 05/19/2021   Procedure: FLEXIBLE SIGMOIDOSCOPY;  Surgeon: Hilarie Fredrickson, MD;  Location: [facility];  Service: Endoscopy;  Laterality: N/A;   FLEXIBLE SIGMOIDOSCOPY N/A 01/29/2022   Procedure: FLEXIBLE SIGMOIDOSCOPY;  Surgeon: Andria Meuse, MD;  Location: [facility];  Service: General;  Laterality: N/A;   LAPAROTOMY N/A 05/20/2021   Procedure: EXPLORATORY LAPAROTOMY WITH RIGHT HEMI  COLECTOMY;  CREATION OF LOOP SIGMOID COLOSTOMY;  Surgeon: Berna Bue, MD;  Location: [facility];  Service: General;  Laterality: N/A;   LEFT HEART CATH AND CORONARY ANGIOGRAPHY N/A 10/13/2019   Procedure: LEFT HEART CATH AND CORONARY ANGIOGRAPHY;  Surgeon: Yvonne Kendall, MD;  Location: [facility];  Service: Cardiovascular;  Laterality: N/A;   TONSILLECTOMY     XI ROBOTIC ASSISTED LOWER ANTERIOR RESECTION N/A 01/29/2022   Procedure: ROBOTIC COVERTED TO OPEN LOWER ANTERIOR RESECTION, LYSIS OF ADHESIONS WITH TAKEDOWN OF LOOP COLOSTOMY;  Surgeon: Andria Meuse, MD;  Location: [facility];  Service: General;  Laterality: N/A;    Family History  Problem Relation Age of Onset   GER disease Father    Aneurysm Mother    Hyperlipidemia Mother    Obesity Mother    Stomach cancer Maternal Grandfather    Colon cancer Neg Hx    Esophageal cancer Neg Hx    Pancreatic cancer Neg Hx    Rectal cancer Neg Hx    Breast cancer Neg Hx     Social:  reports that she has never smoked. She has never used smokeless tobacco. She reports current alcohol use of about 1.0 standard drink of alcohol per week. She reports that she does not use drugs.  Allergies:  Allergies  Allergen Reactions   Sulfa Antibiotics Other (See Comments)    As a child, turned red above neck    Medications: Current Outpatient Medications  Medication Instructions   acetaminophen (TYLENOL) 650 mg, Oral, Daily   atorvastatin (LIPITOR) 80 MG tablet TAKE ONE TABLET BY MOUTH EVERYDAY AT BEDTIME   azelastine (ASTELIN) 0.1 % nasal spray 1 spray, Each Nare, Daily PRN, Use in each nostril as directed    B Complex-C (SUPER B COMPLEX PO) 1 capsule, Oral, 3 times weekly   calcium carbonate (ALKA-SELTZER HEARTBURN) 750 MG chewable tablet 1 tablet, Oral, Daily PRN   Calcium-Magnesium-Zinc (CAL-MAG-ZINC PO) 1 tablet, Oral, Weekly   cetirizine (ZYRTEC) 10 mg, Oral, Daily   clopidogrel (PLAVIX) 75 MG tablet TAKE ONE TABLET BY MOUTH ONCE DAILY    CoQ10 200 mg, Oral, 3 times weekly   docusate sodium (COLACE) 100 mg, Oral, Daily   loperamide (IMODIUM A-D) 2 mg, Oral, 4 times daily PRN   losartan (COZAAR) 25 mg, Oral, Every morning   metoprolol succinate (TOPROL-XL) 25 MG 24 hr tablet TAKE ONE TABLET BY MOUTH EVERY MORNING   montelukast (SINGULAIR) 10 mg, Oral, Daily at bedtime   Multiple Vitamin (MULTIVITAMIN WITH MINERALS) TABS tablet 1 tablet, Oral, 3 times weekly   nitroGLYCERIN (NITROSTAT) 0.4 mg, Sublingual, Every 5 min PRN   omeprazole (PRILOSEC OTC) 20 mg, Oral, Daily   Probiotic Product (PROBIOTIC PO) 1 capsule, Oral, Daily   Psyllium Husk POWD 1 Scoop, Oral, Daily at bedtime    ROS - all of the below systems have been reviewed with the patient and positives are indicated with bold text General: chills, fever or night sweats Eyes: blurry vision or double vision ENT: epistaxis or sore throat Allergy/Immunology: itchy/watery eyes or nasal congestion Hematologic/Lymphatic: bleeding problems, blood clots or swollen lymph nodes Endocrine: temperature intolerance or unexpected weight changes Breast: new or changing breast lumps or nipple discharge Resp: cough, shortness of breath, or wheezing CV: chest pain or dyspnea on exertion GI: as per HPI GU: dysuria, trouble voiding, or hematuria MSK: joint pain or joint stiffness Neuro: TIA or stroke symptoms Derm: pruritus and skin lesion changes Psych: anxiety and depression  Objective   PE Blood pressure (!) 132/59, pulse 64, temperature 98.3 F (36.8 C), temperature source Oral, resp. rate 17, height 5\' 2"  (1.575 m), weight 90.7 kg, SpO2 96%. Constitutional: NAD; conversant; no deformities Eyes: Moist conjunctiva; no lid lag; anicteric; PERRL Neck: Trachea midline; no thyromegaly Lungs: Normal respiratory effort; no tactile fremitus CV: RRR; no palpable thrills; no pitting edema GI: Abd midline incisional and ostomy site hernias; no palpable hepatosplenomegaly MSK: Normal  range of motion of extremities; no clubbing/cyanosis Psychiatric: Appropriate affect; alert and oriented x3 Lymphatic: No palpable cervical or axillary lymphadenopathy  No results found for this or any previous visit (from the past 24 hour(s)).  Imaging Orders  No imaging studies ordered today  CT 06/16/22 Small to moderate left ventricle abdominal wall hernia at the prior colostomy site, which contains only fat. Small midline epigastric ventral hernia, which contains a single small bowel loop. No evidence of bowel obstruction or strangulation. Mild hepatic steatosis. Stable benign left adrenal myelolipoma (no followup imaging is recommended). Aortic Atherosclerosis (ICD10-I70.0).  Hernia defect includes multiple hernias involved in previous midline laparotomy incision and ostomy. All defects will be listed as one hernia defect as I will be covering the entire incision, and the defects do not have an length of 10 cm between them. The total hernia  area is 13.6 cm tall by 9.4 cm wide on CT.    Assessment and Plan   Ms. Ewald has a history of HTN, HLD, CAD (s/p NSTEMI 2021 --> DES), ischemic cardiomyopathy, OSA (on CPAP). She is well known to our practice. Her recent abdominal surgical issues started February of 2023, when Dr. Fredricka Bonine performed an exploratory laparotomy, right hemicolectomy and loop sigmoid colostomy for diverticular stricture. Colonoscopy on 10/29/21, found normal-appearing rectum, diverticulosis but otherwise normal colon. Healthy/patent ileocolic anastomosis. Normal-appearing neoterminal ileum. She then underwent robotic converted to open low anterior resection with loop colostomy takedown and lysis of adhesions with dr. Cliffton Asters on 01/29/2022. She has developed a bulge around her ostomy site and laparotomy, and was referred to my office for consideration for hernia repair.  I recommended robotic incisional hernia repair with mesh. The hernia measures 18.6 x 9.4 cm on CT scan. We  discussed the procedure itself as well as its risk, benefits, and alternatives. After full discussion all questions answered the patient granted consent to proceed. Our surgery scheduler will reach out to the patient to schedule surgery.   Quentin Ore, MD  [facility] Use [email or web address] to contact on call provider

## 2022-10-29 NOTE — Progress Notes (Addendum)
Pacu RN Report to floor given  Gave report to [employer]. Room: 5N20   Discussed surgery, meds given in OR and Pacu, VS, IV fluids given, EBL, urine output, pain and other pertinent information. Also discussed if pt had any family or friends here or belongings with them.   Pt has 4 lapsites with dermabond, CDI, no bleeding or hematomas noted. Foley in place, emptied clr/yellow urine of .   Pt exits my care.

## 2022-10-29 NOTE — Anesthesia Postprocedure Evaluation (Signed)
Anesthesia Post Note  Patient: Valerie Fields  Procedure(s) Performed: ROBOTIC INCISIONAL HERNIA REPAIR WITH MESH (Abdomen) INSERTION OF MESH (Abdomen)     Patient location during evaluation: PACU Anesthesia Type: General Level of consciousness: awake and alert, oriented and patient cooperative Pain management: pain level controlled (pain improving) Vital Signs Assessment: post-procedure vital signs reviewed and stable Respiratory status: spontaneous breathing, nonlabored ventilation, respiratory function stable and patient connected to nasal cannula oxygen Cardiovascular status: blood pressure returned to baseline and stable Postop Assessment: no apparent nausea or vomiting Anesthetic complications: no   No notable events documented.  Last Vitals:  Vitals:   10/29/22 1955 10/29/22 2000  BP:  (!) 146/64  Pulse: 65 71  Resp: 14 17  Temp:    SpO2: 93% 93%    Last Pain:  Vitals:   10/29/22 2000  TempSrc:   PainSc: 7                  Ti[place]ope Emmitt,E. Kacey Dysert

## 2022-10-29 NOTE — Anesthesia Procedure Notes (Signed)
Procedure Name: Intubation Date/Time: 10/29/2022 3:13 PM  Performed by: Laruth Bouchard., CRNAPre-anesthesia Checklist: Patient identified, Emergency Drugs available, Suction available and Patient being monitored Patient Re-evaluated:Patient Re-evaluated prior to induction Oxygen Delivery Method: Circle system utilized Preoxygenation: Pre-oxygenation with 100% oxygen Induction Type: IV induction Ventilation: Mask ventilation without difficulty Laryngoscope Size: Glidescope and 4 Grade View: Grade I Tube type: Oral Tube size: 7.0 mm Number of attempts: 1 Airway Equipment and Method: Stylet and Oral airway Placement Confirmation: ETT inserted through vocal cords under direct vision, positive ETCO2 and breath sounds checked- equal and bilateral Secured at: 21 cm Tube secured with: Tape Dental Injury: Teeth and Oropharynx as per pre-operative assessment

## 2022-10-29 NOTE — Anesthesia Preprocedure Evaluation (Addendum)
Anesthesia Evaluation  Patient identified by MRN, date of birth, ID band Patient awake    Reviewed: Allergy & Precautions, NPO status , Patient's Chart, lab work & pertinent test results  History of Anesthesia Complications Negative for: history of anesthetic complications  Airway Mallampati: II  TM Distance: >3 FB Neck ROM: Full    Dental no notable dental hx. (+) Dental Advisory Given   Pulmonary asthma , sleep apnea and Continuous Positive Airway Pressure Ventilation    Pulmonary exam normal        Cardiovascular hypertension, Pt. on home beta blockers and Pt. on medications + CAD, + Past MI and + Cardiac Stents  Normal cardiovascular exam   '23 TTE - EF 55%. Grade II diastolic dysfunction (pseudonormalization). There is moderately elevated pulmonary artery systolic pressure. Moderate pleural effusion in the left lateral region. Trivial MR.    Neuro/Psych negative neurological ROS  negative psych ROS   GI/Hepatic Neg liver ROS,GERD  Controlled and Medicated,,  Endo/Other   Obesity   Renal/GU negative Renal ROS     Musculoskeletal  (+) Arthritis ,    Abdominal   Peds  Hematology  On plavix    Anesthesia Other Findings   Reproductive/Obstetrics                             Anesthesia Physical Anesthesia Plan  ASA: 3  Anesthesia Plan: General   Post-op Pain Management: Tylenol PO (pre-op)*   Induction: Intravenous  PONV Risk Score and Plan: 3 and Treatment may vary due to age or medical condition, Ondansetron and Dexamethasone  Airway Management Planned: Oral ETT  Additional Equipment: None  Intra-op Plan:   Post-operative Plan: Extubation in OR  Informed Consent: I have reviewed the patients History and Physical, chart, labs and discussed the procedure including the risks, benefits and alternatives for the proposed anesthesia with the patient or authorized representative  who has indicated his/her understanding and acceptance.     Dental advisory given  Plan Discussed with: Anesthesiologist and CRNA  Anesthesia Plan Comments:         Anesthesia Quick Evaluation

## 2022-10-29 NOTE — Transfer of Care (Signed)
Immediate Anesthesia Transfer of Care Note  Patient: Valerie Fields  Procedure(s) Performed: ROBOTIC INCISIONAL HERNIA REPAIR WITH MESH (Abdomen) INSERTION OF MESH (Abdomen)  Patient Location: PACU  Anesthesia Type:General  Level of Consciousness: awake, alert , and oriented  Airway & Oxygen Therapy: Patient Spontanous Breathing and Patient connected to nasal cannula oxygen  Post-op Assessment: Report given to RN and Post -op Vital signs reviewed and stable  Post vital signs: Reviewed and stable  Last Vitals:  Vitals Value Taken Time  BP 131/61 10/29/22 1945  Temp    Pulse 72 10/29/22 1948  Resp 20 10/29/22 1948  SpO2 91 % 10/29/22 1948  Vitals shown include unfiled device data.  Last Pain:  Vitals:   10/29/22 1107  TempSrc:   PainSc: 0-No pain         Complications: No notable events documented.

## 2022-10-30 ENCOUNTER — Encounter (HOSPITAL_COMMUNITY): Payer: Self-pay | Admitting: Surgery

## 2022-10-30 LAB — BASIC METABOLIC PANEL
Anion gap: 12 (ref 5–15)
BUN: 14 mg/dL (ref 8–23)
CO2: 21 mmol/L — ABNORMAL LOW (ref 22–32)
Calcium: 8.7 mg/dL — ABNORMAL LOW (ref 8.9–10.3)
Chloride: 105 mmol/L (ref 98–111)
Creatinine, Ser: 0.96 mg/dL (ref 0.44–1.00)
GFR, Estimated: >60 mL/min (ref >60)
Glucose, Bld: 170 mg/dL — ABNORMAL HIGH (ref 70–99)
Potassium: 4.6 mmol/L (ref 3.5–5.1)
Sodium: 138 mmol/L (ref 135–145)

## 2022-10-30 MED ORDER — OXYCODONE-ACETAMINOPHEN 5-325 MG PO TABS: 1.0000 | ORAL_TABLET | ORAL | 0 refills | Status: AC | PRN

## 2022-10-30 MED ORDER — GABAPENTIN 100 MG PO CAPS: 100.0000 mg | ORAL_CAPSULE | Freq: Three times a day (TID) | ORAL | 2 refills | Status: DC

## 2022-10-30 MED ORDER — METHOCARBAMOL 750 MG PO TABS: 750.0000 mg | ORAL_TABLET | Freq: Four times a day (QID) | ORAL | 0 refills | Status: AC | PRN

## 2022-10-30 NOTE — Plan of Care (Signed)

## 2022-10-30 NOTE — Progress Notes (Signed)
Valerie Fields to be D/C'd Home per MD order. Discussed with the patient and all questions fully answered.    IV catheter discontinued intact. Site without signs and symptoms of complications. Dressing and pressure applied.  An After Visit Summary was printed and given to the patient.  Patient escorted via WC, and D/C home via private auto.  Kai Levins  10/30/2022 2:31 PM

## 2022-10-30 NOTE — Discharge Instructions (Signed)
 VENTRAL HERNIA REPAIR POST OPERATIVE INSTRUCTIONS  Thinking Clearly  The anesthesia may cause you to feel different for 1 or 2 days. Do not drive, drink alcohol, or make any big decisions for at least 2 days.  Nutrition When you wake up, you will be able to drink small amounts of liquid. If you do not feel sick, you can slowly advance your diet to regular foods. Continue to drink lots of fluids, usually about 8 to 10 glasses per day. Eat a high-fiber diet so you don't strain during bowel movements. High-Fiber Foods Foods high in fiber include beans, bran cereals and whole-grain breads, peas, dried fruit (figs, apricots, and dates), raspberries, blackberries, strawberries, sweet corn, broccoli, baked potatoes with skin, plums, pears, apples, greens, and nuts. Activity Slowly increase your activity. Be sure to get up and walk every hour or so to prevent blood clots. No heavy lifting or strenuous activity for 4 weeks following surgery to prevent hernias at your incision sites or recurrence of your hernia. It is normal to feel tired. You may need more sleep than usual.  Get your rest but make sure to get up and move around frequently to prevent blood clots and pneumonia.  Work and Return to School You can go back to work when you feel well enough. Discuss the timing with your surgeon. You can usually go back to school or work 1 week or less after an laparoscopic or an open repair. If your work requires heavy lifting or strenuous activity you need to be placed on light duty for 4 weeks following surgery. You can return to gym class, sports or other physical activities 4 weeks after surgery.  Wound Care You may experience significant bruising throughout the abdominal wall that may track down into the groin including into the scrotum in males.  Rest, elevating the groin and scrotum above the level of the heart, ice and compression with tight fitting underwear or an abdominal binder can help.   Always wash your hands before and after touching near your incision site. Do not soak in a bathtub until cleared at your follow up appointment. You may take a shower 24 hours after surgery. A small amount of drainage from the incision is normal. If the drainage is thick and yellow or the site is red, you may have an infection, so call your surgeon. If you have a drain in one of your incisions, it will be taken out in office when the drainage stops. Steri-Strips will fall off in 7 to 10 days or they will be removed during your first office visit. If you have dermabond glue covering over the incision, allow the glue to flake off on its own. Protect the new skin, especially from the sun. The sun can burn and cause darker scarring. Your scar will heal in about 4 to 6 weeks and will become softer and continue to fade over the next year.  The cosmetic appearance of the incisions will improve over the course of the first year after surgery. Sensation around your incision will return in a few weeks or months.  Bowel Movements After intestinal surgery, you may have loose watery stools for several days. If watery diarrhea lasts longer than 3 days, contact your surgeon. Pain medication (narcotics) can cause constipation. Increase the fiber in your diet with high-fiber foods if you are constipated. You can take an over the counter stool softener like Colace to avoid constipation.  Additional over the counter medications can also be used   if Colace isn't sufficient (for example, Milk of Magnesia or Miralax).  Pain The amount of pain is different for each person. Some people need only 1 to 3 doses of pain control medication, while others need more. Take alternating doses of tylenol and ibuprofen around the clock for the first five days following surgery.  This will provide a baseline of pain control and help with inflammation.  Take the narcotic pain medication in addition if needed for severe pain.  Contact  Your Surgeon at [phone], if you have: Pain that will not go away Pain that gets worse A fever of more than 101F (38.3C) Repeated vomiting Swelling, redness, bleeding, or bad-smelling drainage from your wound site Strong abdominal pain No bowel movement or unable to pass gas for 3 days Watery diarrhea lasting longer than 3 days  Pain Control The goal of pain control is to minimize pain, keep you moving and help you heal. Your surgical team will work with you on your pain plan. Most often a combination of therapies and medications are used to control your pain. You may also be given medication (local anesthetic) at the surgical site. This may help control your pain for several days. Extreme pain puts extra stress on your body at a time when your body needs to focus on healing. Do not wait until your pain has reached a level "10" or is unbearable before telling your doctor or nurse. It is much easier to control pain before it becomes severe. Following a laparoscopic procedure, pain is sometimes felt in the shoulder. This is due to the gas inserted into your abdomen during the procedure. Moving and walking helps to decrease the gas and the right shoulder pain.  Use the guide below for ways to manage your post-operative pain. Learn more by going to facs.org/safepaincontrol.  How Intense Is My Pain Common Therapies to Feel Better       I hardly notice my pain, and it does not interfere with my activities.  I notice my pain and it distracts me, but I can still do activities (sitting up, walking, standing).  Non-Medication Therapies  Ice (in a bag, applied over clothing at the surgical site), elevation, rest, meditation, massage, distraction (music, TV, play) walking and mild exercise Splinting the abdomen with pillows +  Non-Opioid Medications Acetaminophen (Tylenol) Non-steroidal anti-inflammatory drugs (NSAIDS) Aspirin, Ibuprofen (Motrin, Advil) Naproxen (Aleve) Take these as  needed, when you feel pain. Both acetaminophen and NSAIDs help to decrease pain and swelling (inflammation).      My pain is hard to ignore and is more noticeable even when I rest.  My pain interferes with my usual activities.  Non-Medication Therapies  +  Non-Opioid medications  Take on a regular schedule (around-the-clock) instead of as needed. (For example, Tylenol every 6 hours at 9:00 am, 3:00 pm, 9:00 pm, 3:00 am and Motrin every 6 hours at 12:00 am, 6:00 am, 12:00 pm, 6:00 pm)         I am focused on my pain, and I am not doing my daily activities.  I am groaning in pain, and I cannot sleep. I am unable to do anything.  My pain is as bad as it could be, and nothing else matters.  Non-Medication Therapies  +  Around-the-Clock Non-Opioid Medications  +  Short-acting opioids  Opioids should be used with other medications to manage severe pain. Opioids block pain and give a feeling of euphoria (feel high). Addiction, a serious side effect of opioids, is   rare with short-term (a few days) use.  Examples of short-acting opioids include: Tramadol (Ultram), Hydrocodone (Norco, Vicodin), Hydromorphone (Dilaudid), Oxycodone (Oxycontin)     The above directions have been adapted from the American College of [employer] Surgical Patient Education Program.  Please refer to the ACS website if needed: [email or web address]://[email or web address]/-/media/files/[email or web address]   Paul Stechschulte, MD [facility] [address], [address], [place], [place]  [address] ?  [address] [address], [place], [place]   [address] [phone] ? [phone] ? FAX [phone] Web site: [email or web address]  

## 2022-10-30 NOTE — Discharge Summary (Signed)
Patient ID: Valerie Fields [id number] 72 y.o. 11-Jan-1951  10/29/2022  Discharge date and time: 10/30/2022  Admitting Physician: Hyman Hopes Lupe Bonner  Discharge Physician: Hyman Hopes Tramane Gorum  Admission Diagnoses: Incisional hernia [K43.2] Patient Active Problem List   Diagnosis Date Noted   Incisional hernia 10/29/2022   S/P colostomy takedown 01/29/2022   RUQ pain 06/07/2021   Perforation of cecum 05/24/2021   S/P exploratory laparotomy 05/20/2021   Large bowel obstruction (HCC) 05/20/2021   Stricture of sigmoid colon (HCC)    Colonic obstruction (HCC) 05/17/2021   Primary hypertension 05/17/2021   CAD (coronary artery disease), native coronary artery 05/17/2021   Hypokalemia 05/17/2021   OSA (obstructive sleep apnea) 06/27/2020   Chronic diastolic heart failure (HCC) 10/15/2019   Hyperlipidemia 10/15/2019   GERD (gastroesophageal reflux disease) 10/15/2019   Ischemic cardiomyopathy 10/15/2019     Discharge Diagnoses:  Patient Active Problem List   Diagnosis Date Noted   Incisional hernia 10/29/2022   S/P colostomy takedown 01/29/2022   RUQ pain 06/07/2021   Perforation of cecum 05/24/2021   S/P exploratory laparotomy 05/20/2021   Large bowel obstruction (HCC) 05/20/2021   Stricture of sigmoid colon (HCC)    Colonic obstruction (HCC) 05/17/2021   Primary hypertension 05/17/2021   CAD (coronary artery disease), native coronary artery 05/17/2021   Hypokalemia 05/17/2021   OSA (obstructive sleep apnea) 06/27/2020   Chronic diastolic heart failure (HCC) 10/15/2019   Hyperlipidemia 10/15/2019   GERD (gastroesophageal reflux disease) 10/15/2019   Ischemic cardiomyopathy 10/15/2019    Operations: Procedure(s): ROBOTIC INCISIONAL HERNIA REPAIR WITH MESH INSERTION OF MESH  Admission Condition: good  Discharged Condition: good  Indication for Admission: Incisional hernai  Hospital Course: Robotic incisional hernia repair with mesh.  Consults:  None  Significant Diagnostic Studies: None  Treatments: surgery: as above  Disposition: Home  Patient Instructions:  Allergies as of 10/30/2022       Reactions   Sulfa Antibiotics Other (See Comments)   As a child, turned red above neck        Medication List     STOP taking these medications    loperamide 2 MG tablet Commonly known as: IMODIUM A-D       TAKE these medications    acetaminophen 650 MG CR tablet Commonly known as: TYLENOL Take 650 mg by mouth daily.   Alka-Seltzer Heartburn 750 MG chewable tablet Generic drug: calcium carbonate Chew 1 tablet by mouth daily as needed for heartburn.   atorvastatin 80 MG tablet Commonly known as: LIPITOR TAKE ONE TABLET BY MOUTH EVERYDAY AT BEDTIME   azelastine 0.1 % nasal spray Commonly known as: ASTELIN Place 1 spray into both nostrils daily as needed for allergies. Use in each nostril as directed   CAL-MAG-ZINC PO Take 1 tablet by mouth once a week.   cetirizine 10 MG tablet Commonly known as: ZYRTEC Take 10 mg by mouth daily.   clopidogrel 75 MG tablet Commonly known as: PLAVIX TAKE ONE TABLET BY MOUTH ONCE DAILY   CoQ10 200 MG Caps Take 200 mg by mouth 3 (three) times a week.   docusate sodium 100 MG capsule Commonly known as: COLACE Take 100 mg by mouth daily.   gabapentin 100 MG capsule Commonly known as: Neurontin Take 1 capsule (100 mg total) by mouth 3 (three) times daily.   losartan 25 MG tablet Commonly known as: COZAAR TAKE ONE TABLET BY MOUTH EVERY MORNING   methocarbamol 750 MG tablet Commonly known as: Robaxin-750 Take 1 tablet (750 mg total)  by mouth every 6 (six) hours as needed for muscle spasms.   metoprolol succinate 25 MG 24 hr tablet Commonly known as: TOPROL-XL TAKE ONE TABLET BY MOUTH EVERY MORNING   montelukast 10 MG tablet Commonly known as: SINGULAIR Take 10 mg by mouth at bedtime.   multivitamin with minerals Tabs tablet Take 1 tablet by mouth 3 (three) times  a week.   nitroGLYCERIN 0.4 MG SL tablet Commonly known as: NITROSTAT Place 1 tablet (0.4 mg total) under the tongue every 5 (five) minutes as needed for chest pain.   omeprazole 20 MG tablet Commonly known as: PRILOSEC OTC Take 20 mg by mouth daily.   oxyCODONE-acetaminophen 5-325 MG tablet Commonly known as: Percocet Take 1 tablet by mouth every 4 (four) hours as needed for severe pain.   PROBIOTIC PO Take 1 capsule by mouth daily.   Psyllium Husk Powd Take 1 Scoop by mouth at bedtime.   SUPER B COMPLEX PO Take 1 capsule by mouth 3 (three) times a week.        Activity: no heavy lifting for 4 weeks Diet: regular diet Wound Care: keep wound clean and dry  Follow-up:  With Dr. Dossie Der in 4 weeks.  Signed: Hyman Hopes Latoi [facility] [place]   10/30/2022, 1:16 PM

## 2022-11-04 NOTE — Addendum Note (Signed)
Addendum  created 11/04/22 1121 by Val Eagle, MD   Attestation recorded in Intraprocedure, Intraprocedure Attestations filed

## 2022-11-10 ENCOUNTER — Encounter (HOSPITAL_COMMUNITY): Payer: Self-pay | Admitting: Surgery

## 2022-12-09 ENCOUNTER — Other Ambulatory Visit: Payer: Self-pay | Admitting: Cardiovascular Disease

## 2022-12-23 NOTE — Telephone Encounter (Signed)
Advised patient to check w PCP re: labs for any concerning new bruising.  Will route to APP for further recommendations as Dr. Clifton James out of the office this week.

## 2023-03-06 NOTE — Progress Notes (Unsigned)
Cardiology Office Note:  .   Date:  03/09/2023  ID:  Valerie Fields, DOB May 03, 1950, MRN [id number] PCP: Jarrett Soho, PA-C  [facility] Providers Cardiologist:  Verne Carrow, MD    Patient Profile: .      PMH Coronary artery disease Ischemic cardiomyopathy Asthma Sleep apnea on CPAP GERD Chronic HFpEF Hyperlipidemia  She was admitted to [facility] July 2021 with NSTEMI.  Cardiac cath revealed severe mid LAD stenosis involving a diagonal. LAD was treated with a stent in the diagonal was treated with balloon angioplasty.  Echo post MI with LVEF 40 to 45%.  Most recent echo March 2023 with LVEF 55%, grade 2 diastolic dysfunction, no significant valve disease.  She has been treated with Protonix for GERD.  She had perforated bowel and colon resection February 2023 and then colostomy reversal 01/29/2022.  Last cardiology clinic visit was 02/26/2022 with Dr. Clifton James.  She did not have any concerning cardiac symptoms.  There was no change in medications and 1 year follow-up was recommended.       History of Present Illness: .   Valerie Fields is a very pleasant 72 y.o. female who is here today for follow-up of CAD. Had recent hernia repair, reports overall improvement post-surgery despite initial complications. No cardiac concerns during surgery or recovery. She started Ozempic for weight loss due to no weight loss with diet and exercise. She reports good tolerance with minor side effects of fatigue and occasional acid reflux. She maintains a balanced diet, with a focus on protein and salads, and has reduced her dessert intake. She reports occasional shortness of breath when going uphill or on steep inclines, but no issues during regular floor exercise sessions. No concerns with BP elevation. She denies chest pain, palpitations, orthopnea, PND, edema, presyncope, syncope. Has intermittent diarrhea, which she attributes to her colon condition rather than  medication.   Discussed the use of AI scribe software for clinical note transcription with the patient, who gave verbal consent to proceed.   ROS: See HPI       Studies Reviewed: .        Risk Assessment/Calculations:             Physical Exam:   VS:  BP 114/76   Pulse 72   Ht 5\' 2"  (1.575 m)   Wt 219 lb 9.6 oz (99.6 kg)   SpO2 96%   BMI 40.17 kg/m    Wt Readings from Last 3 Encounters:  03/09/23 219 lb 9.6 oz (99.6 kg)  10/29/22 200 lb (90.7 kg)  09/29/22 218 lb 12.8 oz (99.2 kg)    GEN: Obese, well developed in no acute distress NECK: No JVD; No carotid bruits CARDIAC: RRR, no murmurs, rubs, gallops RESPIRATORY:  Clear to auscultation without rales, wheezing or rhonchi  ABDOMEN: Soft, non-tender, non-distended EXTREMITIES:  No edema; No deformity     ASSESSMENT AND PLAN: .    CAD: S/p PCI/DES and balloon angioplasty to mid LAD/D1 09/2019. EF 35% at time of cath. She has shortness of breath with inclines but denies chest pain, dyspnea, or other symptoms concerning for angina. No indication for further ischemic evaluation at this time. She expressed concern about future events. We reviewed her cardiac catheterization in detail. She has minimal disease in the RCA. She continues to tolerate Plavix without bleeding concerns.  LDL well-controlled at 69 on 12/26/2022. She is physically active with various forms of exercise. She is on GDMT including metoprolol, losartan, clopidogrel, and atorvastatin.  Encouraged secondary prevention including heart healthy mostly plant based diet avoiding saturated fat, processed foods, simple carbohydrates, and sugar along with aiming for at least 150 minutes of moderate intensity exercise each week. Continue weight loss efforts.   ICM: LVEF 35 at time of NSTEMI with subsequent stenting and balloon angioplasty 09/2019. TTE post cath revealed LVEF 40-45% with normalization of EF 12/2019. Most recent TTE 06/07/21 revealed normal LVEF 55%.  No evidence of  volume overload on exam.  Has mild shortness of breath with inclines but no orthopnea, PND, or edema. She is feeling well and is working on weight loss with GLP1 agonist, healthy diet and exercise.   Chronic HFpEF:  LVEF 55, grade 2 diastolic dysfunction on echo 05/2021.  No indication of volume overload on exam. Has some shortness of breath when going up inclines, but overall exercises consistently without concerning cardiac symptoms. Encouraged increasing her incline on elliptical or stair master for greater endurance. Continue GDMT including losartan, metoprolol.  Hyperlipidemia LDL goal < 70: Lipid panel 12/26/2022 with total cholesterol 137, HDL 38, LDL 69, and triglycerides 161.          Dispo: 1 year with Dr. Clifton James or me  Signed, Eligha Bridegroom, NP-C

## 2023-03-09 ENCOUNTER — Encounter: Payer: Self-pay | Admitting: Nurse Practitioner

## 2023-03-09 ENCOUNTER — Ambulatory Visit: Payer: Medicare HMO | Attending: Nurse Practitioner | Admitting: Nurse Practitioner

## 2023-03-09 VITALS — BP 114/76 | HR 72 | Ht 62.0 in | Wt 219.6 lb

## 2023-03-09 DIAGNOSIS — I2510 Atherosclerotic heart disease of native coronary artery without angina pectoris: Secondary | ICD-10-CM | POA: Diagnosis not present

## 2023-03-09 DIAGNOSIS — I5032 Chronic diastolic (congestive) heart failure: Secondary | ICD-10-CM

## 2023-03-09 DIAGNOSIS — E785 Hyperlipidemia, unspecified: Secondary | ICD-10-CM | POA: Diagnosis not present

## 2023-03-09 DIAGNOSIS — I255 Ischemic cardiomyopathy: Secondary | ICD-10-CM | POA: Diagnosis not present

## 2023-03-09 NOTE — Patient Instructions (Signed)
 Medication Instructions:   Your physician recommends that you continue on your current medications as directed. Please refer to the Current Medication list given to you today.   *If you need a refill on your cardiac medications before your next appointment, please call your pharmacy*   Lab Work:  None ordered.  If you have labs (blood work) drawn today and your tests are completely normal, you will receive your results only by: MyChart Message (if you have MyChart) OR A paper copy in the mail If you have any lab test that is abnormal or we need to change your treatment, we will call you to review the results.   Testing/Procedures:  None ordered.   Follow-Up: At [facility], you and your health needs are our priority.  As part of our continuing mission to provide you with exceptional heart care, we have created designated Provider Care Teams.  These Care Teams include your primary Cardiologist (physician) and Advanced Practice Providers (APPs -  Physician Assistants and Nurse Practitioners) who all work together to provide you with the care you need, when you need it.  We recommend signing up for the patient portal called "MyChart".  Sign up information is provided on this After Visit Summary.  MyChart is used to connect with patients for Virtual Visits (Telemedicine).  Patients are able to view lab/test results, encounter notes, upcoming appointments, etc.  Non-urgent messages can be sent to your provider as well.   To learn more about what you can do with MyChart, go to [email or web address].    Your next appointment:   1 year(s)  Provider:   Verne Carrow, MD     Other Instructions  Your physician wants you to follow-up in: 1 year.  You will receive a reminder letter in the mail two months in advance. If you don't receive a letter, please call our office to schedule the follow-up appointment.

## 2023-07-15 ENCOUNTER — Other Ambulatory Visit: Payer: Self-pay | Admitting: Family Medicine

## 2023-07-15 DIAGNOSIS — Z1231 Encounter for screening mammogram for malignant neoplasm of breast: Secondary | ICD-10-CM

## 2023-08-10 ENCOUNTER — Encounter (HOSPITAL_COMMUNITY): Payer: Self-pay

## 2023-08-17 ENCOUNTER — Ambulatory Visit
Admission: RE | Admit: 2023-08-17 | Discharge: 2023-08-17 | Disposition: A | Discharge status: Home / Self Care | Source: Ambulatory Visit | Attending: Family Medicine | Admitting: Family Medicine

## 2023-08-17 DIAGNOSIS — Z1231 Encounter for screening mammogram for malignant neoplasm of breast: Secondary | ICD-10-CM

## 2023-08-18 ENCOUNTER — Other Ambulatory Visit: Payer: Self-pay | Admitting: Cardiovascular Disease

## 2023-08-19 ENCOUNTER — Encounter: Payer: Self-pay | Admitting: Cardiovascular Disease

## 2023-08-19 MED ORDER — METOPROLOL SUCCINATE ER 25 MG PO TB24: 25.0000 mg | ORAL_TABLET | Freq: Every day | ORAL | 2 refills | Status: AC

## 2023-08-19 MED ORDER — LOSARTAN POTASSIUM 25 MG PO TABS: 25.0000 mg | ORAL_TABLET | Freq: Every day | ORAL | 2 refills | Status: AC

## 2024-02-16 ENCOUNTER — Other Ambulatory Visit: Payer: Self-pay | Admitting: Family Medicine

## 2024-02-16 DIAGNOSIS — M79622 Pain in left upper arm: Secondary | ICD-10-CM

## 2024-02-17 ENCOUNTER — Encounter: Payer: Self-pay | Admitting: Family Medicine

## 2024-02-17 ENCOUNTER — Ambulatory Visit
Admission: RE | Admit: 2024-02-17 | Discharge: 2024-02-17 | Disposition: A | Discharge status: Home / Self Care | Source: Ambulatory Visit | Attending: Family Medicine | Admitting: Family Medicine

## 2024-02-17 DIAGNOSIS — M79622 Pain in left upper arm: Secondary | ICD-10-CM

## 2024-03-08 ENCOUNTER — Ambulatory Visit: Admitting: Cardiovascular Disease

## 2024-03-17 ENCOUNTER — Encounter: Payer: Self-pay | Admitting: Cardiovascular Disease

## 2024-03-17 ENCOUNTER — Ambulatory Visit: Admitting: Cardiovascular Disease

## 2024-03-17 VITALS — BP 122/68 | HR 90 | Ht 62.0 in | Wt 197.6 lb

## 2024-03-17 DIAGNOSIS — I2510 Atherosclerotic heart disease of native coronary artery without angina pectoris: Secondary | ICD-10-CM | POA: Diagnosis not present

## 2024-03-17 DIAGNOSIS — E785 Hyperlipidemia, unspecified: Secondary | ICD-10-CM | POA: Diagnosis not present

## 2024-03-17 DIAGNOSIS — I255 Ischemic cardiomyopathy: Secondary | ICD-10-CM

## 2024-03-17 NOTE — Progress Notes (Signed)
 Chief Complaint  Patient presents with   Follow-up    CAD   History of Present Illness: 73 yo female with history of CAD, ischemic cardiomyopathy, asthma, sleep apnea and GERD who is here today for cardiac follow up. She was admitted to [facility] July 2021 with a NSTEMI. Cardiac cath with severe mid LAD stenosis involving a Diagonal. The LAD was treated with a stent and the Diagonal was treated with balloon angioplasty. Echo post MI with LVEF=40-45%. Most recent echo March 2023 with LVEF=55%, grade 2 diastolic dysfunction, no significant valve disease. She has been treated with Protonix  for GERD. She is on CPAP for sleep apnea. She had a perforated bowel and had colon resection in February 2023 and then had colostomy reversal November 2023.    She is here today for follow up. The patient denies any chest pain, dyspnea, palpitations, lower extremity edema, orthopnea, PND, dizziness, near syncope or syncope.   Primary Care Physician: Katina Pfeiffer, PA-C  Past Medical History:  Diagnosis Date   Allergy    Anemia    Arthritis    Asthma    Chronic combined systolic and diastolic CHF 10/15/2019   Ischemic CM // Echocardiogram 7/21: EF 40-45, Gr 2 DD, mid to apical ant/ant-sept AK, RVSP 23.4, trivial MR   Coronary artery disease    NSTEMI 7/21: oLAD 30, pLAD 99 >> PCI: DES; D1 90 >> PCI: POBA, prox and mid RCA 15, EF 35   Diverticulitis    Gallbladder problem    GERD (gastroesophageal reflux disease)    History of heart attack    Hyperlipidemia 10/15/2019   Zetia DC'd during admit for NSTEMI 7/21 >> ? to Atorvastatin    IBS (irritable colon syndrome)    Ischemic cardiomyopathy 10/15/2019   Joint pain    Lactose intolerance    Multiple food allergies    Non-ST elevation (NSTEMI) MI 10/13/2019   PCI:  DES to LAD and POBA to D1   Sleep apnea     Past Surgical History:  Procedure Laterality Date   APPENDECTOMY     BREAST BIOPSY Left    No Scar seen    BREAST CYST EXCISION Left    No  scar seen    CARDIAC CATHETERIZATION     COLONOSCOPY     COLONOSCOPY     COLOSTOMY REVERSAL     CORONARY STENT INTERVENTION  10/13/2019   CORONARY STENT INTERVENTION N/A 10/13/2019   Procedure: CORONARY STENT INTERVENTION;  Surgeon: Mady Bruckner, MD;  Location: [facility];  Service: Cardiovascular;  Laterality: N/A;  mid lad   FLEXIBLE SIGMOIDOSCOPY N/A 05/19/2021   Procedure: FLEXIBLE SIGMOIDOSCOPY;  Surgeon: Abran Norleen SAILOR, MD;  Location: [facility];  Service: Endoscopy;  Laterality: N/A;   FLEXIBLE SIGMOIDOSCOPY N/A 01/29/2022   Procedure: FLEXIBLE SIGMOIDOSCOPY;  Surgeon: Teresa Bruckner HERO, MD;  Location: [facility];  Service: General;  Laterality: N/A;   INSERTION OF MESH N/A 10/29/2022   Procedure: INSERTION OF MESH;  Surgeon: Lyndel Deward PARAS, MD;  Location: [facility];  Service: General;  Laterality: N/A;   LAPAROTOMY N/A 05/20/2021   Procedure: EXPLORATORY LAPAROTOMY WITH RIGHT HEMI  COLECTOMY; CREATION OF LOOP SIGMOID COLOSTOMY;  Surgeon: Signe Mitzie LABOR, MD;  Location: [facility];  Service: General;  Laterality: N/A;   LEFT HEART CATH AND CORONARY ANGIOGRAPHY N/A 10/13/2019   Procedure: LEFT HEART CATH AND CORONARY ANGIOGRAPHY;  Surgeon: Mady Bruckner, MD;  Location: [facility];  Service: Cardiovascular;  Laterality: N/A;  TONSILLECTOMY     XI ROBOTIC ASSISTED LOWER ANTERIOR RESECTION N/A 01/29/2022   Procedure: ROBOTIC COVERTED TO OPEN LOWER ANTERIOR RESECTION, LYSIS OF ADHESIONS WITH TAKEDOWN OF LOOP COLOSTOMY;  Surgeon: Teresa Lonni HERO, MD;  Location: [facility];  Service: General;  Laterality: N/A;   XI ROBOTIC ASSISTED VENTRAL HERNIA N/A 10/29/2022   Procedure: ROBOTIC INCISIONAL HERNIA REPAIR WITH MESH;  Surgeon: Lyndel Deward PARAS, MD;  Location: [facility];  Service: General;  Laterality: N/A;    Current Outpatient Medications  Medication Sig Dispense Refill   acetaminophen  (TYLENOL ) 650 MG CR tablet Take 650 mg by mouth daily.     atorvastatin   (LIPITOR ) 80 MG tablet TAKE 1 TABLET BY MOUTH EVERYDAY AT BEDTIME 90 tablet 2   azelastine  (ASTELIN ) 0.1 % nasal spray Place 1 spray into both nostrils daily as needed for allergies. Use in each nostril as directed     B Complex-C (SUPER B COMPLEX PO) Take 1 capsule by mouth 3 (three) times a week.     calcium  carbonate (ALKA-SELTZER HEARTBURN) 750 MG chewable tablet Chew 1 tablet by mouth daily as needed for heartburn.     Calcium -Magnesium -Zinc (CAL-MAG-ZINC PO) Take 1 tablet by mouth once a week.     cetirizine (ZYRTEC) 10 MG tablet Take 10 mg by mouth daily.     clopidogrel  (PLAVIX ) 75 MG tablet TAKE 1 TABLET BY MOUTH EVERY DAY 90 tablet 2   Coenzyme Q10 (COQ10) 200 MG CAPS Take 200 mg by mouth 3 (three) times a week.     docusate sodium  (COLACE) 100 MG capsule Take 100 mg by mouth daily.     losartan  (COZAAR ) 25 MG tablet Take 1 tablet (25 mg total) by mouth daily. 90 tablet 2   methocarbamol  (ROBAXIN -750) 750 MG tablet Take 1 tablet (750 mg total) by mouth every 6 (six) hours as needed for muscle spasms. 30 tablet 0   metoprolol  succinate (TOPROL -XL) 25 MG 24 hr tablet Take 1 tablet (25 mg total) by mouth daily. 90 tablet 2   montelukast  (SINGULAIR ) 10 MG tablet Take 10 mg by mouth at bedtime.  3   Multiple Vitamin (MULTIVITAMIN WITH MINERALS) TABS tablet Take 1 tablet by mouth 3 (three) times a week.     omeprazole (PRILOSEC OTC) 20 MG tablet Take 20 mg by mouth daily.     OZEMPIC, 2 MG/DOSE, 8 MG/3ML SOPN Inject 2 mg into the skin once a week.     Probiotic Product (PROBIOTIC PO) Take 1 capsule by mouth daily.     Psyllium Husk POWD Take 1 Scoop by mouth at bedtime.     nitroGLYCERIN  (NITROSTAT ) 0.4 MG SL tablet Place 1 tablet (0.4 mg total) under the tongue every 5 (five) minutes as needed for chest pain. 25 tablet 6   No current facility-administered medications for this visit.    Allergies  Allergen Reactions   Sulfa Antibiotics Other (See Comments)    As a child, turned red  above neck    Social History   Socioeconomic History   Marital status: Divorced    Spouse name: Not on file   Number of children: 2   Years of education: 16   Highest education level: Not on file  Occupational History   Occupation: Retired  Tobacco Use   Smoking status: Never   Smokeless tobacco: Never  Vaping Use   Vaping status: Never Used  Substance and Sexual Activity   Alcohol use: Yes    Alcohol/week: 1.0 standard drink of alcohol  Types: 1 Glasses of wine per week   Drug use: No   Sexual activity: Not Currently    Birth control/protection: Post-menopausal  Other Topics Concern   Not on file  Social History Narrative   Not on file   Social Drivers of Health   Tobacco Use: Low Risk (03/17/2024)   Patient History    Smoking Tobacco Use: Never    Smokeless Tobacco Use: Never    Passive Exposure: Not on file  Financial Resource Strain: Not on file  Food Insecurity: Unknown (10/30/2022)   Hunger Vital Sign    Worried About Running Out of Food in the Last Year: Never true    Ran Out of Food in the Last Year: Not on file  Transportation Needs: No Transportation Needs (10/30/2022)   PRAPARE - Administrator, Civil Service (Medical): No    Lack of Transportation (Non-Medical): No  Physical Activity: Not on file  Stress: Not on file  Social Connections: Not on file  Intimate Partner Violence: Not At Risk (10/30/2022)   Humiliation, Afraid, Rape, and Kick questionnaire    Fear of Current or Ex-Partner: No    Emotionally Abused: No    Physically Abused: No    Sexually Abused: No  Depression (PHQ2-9): Not on file  Alcohol Screen: Not on file  Housing: Low Risk (10/30/2022)   Housing    Last Housing Risk Score: 0  Utilities: Not At Risk (10/30/2022)   AHC Utilities    Threatened with loss of utilities: No  Health Literacy: Not on file    Family History  Problem Relation Age of Onset   GER disease Father    Aneurysm Mother    Hyperlipidemia Mother     Obesity Mother    Stomach cancer Maternal Grandfather    Colon cancer Neg Hx    Esophageal cancer Neg Hx    Pancreatic cancer Neg Hx    Rectal cancer Neg Hx    Breast cancer Neg Hx     Review of Systems:  As stated in the HPI and otherwise negative.   BP 122/68   Pulse 90   Ht 5' 2 (1.575 m)   Wt 197 lb 9.6 oz (89.6 kg)   SpO2 95%   BMI 36.14 kg/m   Physical Examination: General: Well developed, well nourished, NAD  HEENT: OP clear, mucus membranes moist  SKIN: warm, dry. No rashes. Neuro: No focal deficits  Musculoskeletal: Muscle strength 5/5 all ext  Psychiatric: Mood and affect normal  Neck: No JVD, no carotid bruits, no thyromegaly, no lymphadenopathy.  Lungs:Clear bilaterally, no wheezes, rhonci, crackles Cardiovascular: Regular rate and rhythm. No murmurs, gallops or rubs. Abdomen:Soft. Bowel sounds present. Non-tender.  Extremities: No lower extremity edema. Pulses are 2 + in the bilateral DP/PT.  EKG:  EKG is ordered today. The ekg ordered today demonstrates  EKG Interpretation Date/Time:  Thursday March 17 2024 15:16:01 EST Ventricular Rate:  90 PR Interval:  148 QRS Duration:  74 QT Interval:  388 QTC Calculation: 474 R Axis:   -5  Text Interpretation: Sinus rhythm with occasional Premature ventricular complexes Poor R wave progression, possible prior anterior infarct Confirmed by Verlin Bruckner [phone]) on 03/17/2024 3:18:01 PM   Recent Labs: No results found for requested labs within last 365 days.   Lipid Panel    Component Value Date/Time   CHOL 102 03/10/2022 0818   TRIG 104 03/10/2022 0818   HDL 40 03/10/2022 0818   CHOLHDL 2.6 03/10/2022 0818  CHOLHDL 3.7 10/13/2019 1502   VLDL 32 10/13/2019 1502   LDLCALC 42 03/10/2022 0818     Wt Readings from Last 3 Encounters:  03/17/24 197 lb 9.6 oz (89.6 kg)  03/09/23 219 lb 9.6 oz (99.6 kg)  10/29/22 200 lb (90.7 kg)   Assessment and Plan:   1. CAD without angina: No chest pain  suggestive of angina.  -Continue Plavix , Lipitor  and Toprol .    2. Ischemic cardiomyopathy: LVEF normal by echo in March 2023.  -Continue Toprol  and Losartan     3. Hyperlipidemia: LDL 39 in November 2025.   -Continue Lipitor       Labs/ tests ordered today include:   Orders Placed This Encounter  Procedures   EKG 12-Lead   Disposition:   F/U with me in 12 months  Signed, Lonni Cash, MD 03/17/2024 4:33 PM    [facility] [address] [place], [place], [place]  [address] Phone: [phone]; Fax: [phone]

## 2024-03-17 NOTE — Patient Instructions (Signed)
